# Patient Record
Sex: Female | Born: 1975
Health system: Southern US, Community
[De-identification: ages and names within clinical notes are randomized; demographics above are authoritative.]

## PROBLEM LIST (undated history)

## (undated) DIAGNOSIS — K219 Gastro-esophageal reflux disease without esophagitis: Secondary | ICD-10-CM

## (undated) DIAGNOSIS — F17200 Nicotine dependence, unspecified, uncomplicated: Secondary | ICD-10-CM

## (undated) DIAGNOSIS — J069 Acute upper respiratory infection, unspecified: Secondary | ICD-10-CM

## (undated) DIAGNOSIS — Z1379 Encounter for other screening for genetic and chromosomal anomalies: Secondary | ICD-10-CM

## (undated) DIAGNOSIS — R112 Nausea with vomiting, unspecified: Secondary | ICD-10-CM

## (undated) DIAGNOSIS — N390 Urinary tract infection, site not specified: Secondary | ICD-10-CM

## (undated) DIAGNOSIS — Z9889 Other specified postprocedural states: Secondary | ICD-10-CM

## (undated) DIAGNOSIS — L989 Disorder of the skin and subcutaneous tissue, unspecified: Secondary | ICD-10-CM

## (undated) DIAGNOSIS — C50912 Malignant neoplasm of unspecified site of left female breast: Secondary | ICD-10-CM

## (undated) DIAGNOSIS — T8859XA Other complications of anesthesia, initial encounter: Secondary | ICD-10-CM

## (undated) DIAGNOSIS — T4145XA Adverse effect of unspecified anesthetic, initial encounter: Secondary | ICD-10-CM

## (undated) HISTORY — DX: Encounter for other screening for genetic and chromosomal anomalies: Z13.79

---
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
## 2001-02-10 ENCOUNTER — Encounter: Admission: RE | Admit: 2001-02-10 | Discharge: 2001-02-10 | Payer: Self-pay | Admitting: Obstetrics & Gynecology

## 2001-02-18 ENCOUNTER — Inpatient Hospital Stay (HOSPITAL_COMMUNITY): Admission: RE | Admit: 2001-02-18 | Discharge: 2001-03-02 | Payer: Self-pay | Admitting: Obstetrics & Gynecology

## 2001-02-18 ENCOUNTER — Encounter (INDEPENDENT_AMBULATORY_CARE_PROVIDER_SITE_OTHER): Payer: Self-pay | Admitting: Specialist

## 2001-02-26 ENCOUNTER — Encounter: Payer: Self-pay | Admitting: *Deleted

## 2001-03-03 ENCOUNTER — Encounter: Admission: RE | Admit: 2001-03-03 | Discharge: 2001-04-02 | Payer: Self-pay | Admitting: Obstetrics & Gynecology

## 2001-05-03 ENCOUNTER — Encounter: Admission: RE | Admit: 2001-05-03 | Discharge: 2001-06-02 | Payer: Self-pay | Admitting: Obstetrics & Gynecology

## 2001-07-03 ENCOUNTER — Encounter: Admission: RE | Admit: 2001-07-03 | Discharge: 2001-08-02 | Payer: Self-pay | Admitting: Obstetrics & Gynecology

## 2001-08-03 ENCOUNTER — Encounter: Admission: RE | Admit: 2001-08-03 | Discharge: 2001-09-02 | Payer: Self-pay | Admitting: Obstetrics & Gynecology

## 2001-10-01 ENCOUNTER — Encounter: Admission: RE | Admit: 2001-10-01 | Discharge: 2001-10-31 | Payer: Self-pay | Admitting: Obstetrics & Gynecology

## 2001-12-01 ENCOUNTER — Encounter: Admission: RE | Admit: 2001-12-01 | Discharge: 2001-12-31 | Payer: Self-pay | Admitting: Obstetrics & Gynecology

## 2002-01-31 ENCOUNTER — Encounter: Admission: RE | Admit: 2002-01-31 | Discharge: 2002-03-02 | Payer: Self-pay | Admitting: Obstetrics & Gynecology

## 2002-03-03 ENCOUNTER — Encounter: Admission: RE | Admit: 2002-03-03 | Discharge: 2002-04-02 | Payer: Self-pay | Admitting: Obstetrics & Gynecology

## 2002-05-03 ENCOUNTER — Encounter: Admission: RE | Admit: 2002-05-03 | Discharge: 2002-06-02 | Payer: Self-pay | Admitting: Obstetrics & Gynecology

## 2008-09-28 ENCOUNTER — Emergency Department (HOSPITAL_COMMUNITY): Admission: EM | Admit: 2008-09-28 | Discharge: 2008-09-28 | Payer: Self-pay | Admitting: Emergency Medicine

## 2010-09-18 LAB — STREP A DNA PROBE: Group A Strep Probe: NEGATIVE

## 2010-09-18 LAB — RAPID STREP SCREEN (MED CTR MEBANE ONLY): Streptococcus, Group A Screen (Direct): NEGATIVE

## 2010-10-25 NOTE — Discharge Summary (Signed)
[place]  Patient:    Kathryn Stanley, Kathryn Stanley Visit Number: [id number] MRN: [id number]          Service Type: OBS Location: [id number] Attending Physician:  [name] Dictated by:   Herminio Commons. Donnal Debar, M.D. Admit Date:  02/18/2001 Discharge Date: 03/02/2001                             Discharge Summary  DATE OF BIRTH:                April 05, 1976  REASON FOR ADMISSION:         Shortened cervix on ultrasound.  HISTORY OF PRESENT ILLNESS:   This 35 year old G1, at the time P0, presented at 24-0/7 weeks by menstrual period, confirmed by a 16-week ultrasound.  She is for anatomic survey and was noted to have a shortened cervix at 1.9 cm on ultrasound.  The patient had reported some occasional cramping but had not reported any frank contractions.  The pregnancy was complicated by a twin gestation and the fact that the patient was rubella nonimmune, and she also had a history of pyelonephritis in the pregnancy and multiple urinary tract infections in the past.  MEDICATIONS:                  Prenatal vitamin and Macrobid.  ALLERGIES:                    AMOXICILLIN.  OBSTETRICAL HISTORY:          No prior pregnancies.  GYNECOLOGICAL HISTORY:        History of pyelonephritis.  She was on Macrobid for this.  PAST MEDICAL HISTORY:         Multiple UTIs as a child.  PAST SURGICAL HISTORY:        None.  FAMILY HISTORY:               Noncontributory.  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  No other illicit drug use.  No alcohol or tobacco use.  PRENATAL LABORATORY DATA:     All negative except for the fact that the patient was rubella nonimmune.  PHYSICAL EXAMINATION:  GENERAL:                      Well-developed, well-nourished white female in no acute distress.  HEART:                        Regular rate and rhythm.  LUNGS:                        Clear to auscultation.  ABDOMEN:                       Soft, gravid, positive bowel sounds.  EXTREMITIES:                  With 2+ pulses, no edema.  PELVIC:                       Digital cervical exam was 1, 50%, and posterior, 0 station.  She was not having contractions at the time.  HOSPITAL COURSE:  The patient was admitted for treatment of what initially was thought to be cervicitis.  She was placed on clindamycin.  She was then found to have an enterococcus urinary tract infection.  This was switched to vancomycin IV.  The patient did have one episode of flushing and "red man syndrome" which is not an allergic reaction.  She recovered nicely from this.  Culture and sensitivity revealed that the Enterococcus was sensitive to Macrobid, so the vancomycin was discontinued.  The patient was placed on p.o. Macrobid.  Further request of the patient was made of what her specific allergies were.  She said it occurred in middle school.  She was not sure but was told by her mom that she had a rash with amoxicillin. She had no breathing problems or urticaria.  The patients GBS was negative; therefore, she was not initially placed on Unasyn.  Later in the hospitalization the patient was placed on IV Unasyn per protocol.  On hospital day #8 the patients cramping sensation increased, with some contractions being picked up on the tocolysis monitor.  She subsequently reported increased cramping, but the tocolysis was not measuring well per nurse.  This was adjusted, and the patient was noted to be having a few contractions per report, approximately three contractions over the space of one hour.  The patient was on Procardia. She was placed on Unasyn.  She was placed in Trendelenburg.  She received Terbutaline subcutaneous x 1 to stop her contractions and was then reevaluated.  The patient was at that point having contractions in her back and vomiting x 1, even after receiving Phenergan.  She at this time was reporting painful contractions,  increase in her pain.  At this time her contractions still were not picking up that well on the tocolysis, with only two in the last hour being reported.  At this time the Procardia was discontinued, and the patient was started on magnesium sulfate as well as given Stadol 1 mg IV.  The patient was then reevaluated later early in the morning on hospital day #9.  She was feeling a pressure sensation with her contractions.  She was then at that point noted to be contracting every 7-10 minutes on tocolysis.  The attending physician at this time saw the patient and evaluated her, and she was noted to be complete at 10 cm and 100% effaced. Bedside ultrasound revealed twin A and twin B to be breech.  The patient then received stat C-section for delivery of breech twins at complete.  The rest of her hospital course was uneventful.  On postoperative day #2 she was discharged home with Motrin and Percocet for pain.  Her staples were removed prior to discharge, and iron sulfate for her anemia with H&H of 9.3 and 26.7.   FOLLOW-UP:                    She is to follow up at the [facility] in six weeks for postpartum check.  She is to have a repeat blood count at that time.  DISCHARGE DIAGNOSIS:          Twin gestation, delivered preterm.  CONDITION ON DISCHARGE:       Stable. Dictated by:   Herminio Commons. Donnal Debar, M.D. Attending Physician:  [name] DD:  03/02/01 TD:  03/02/01 Job: [id number]

## 2010-10-25 NOTE — Op Note (Signed)
[facility]  Patient:    Kathryn Stanley, Kathryn Stanley Visit Number: [id number] MRN: [id number]          Service Type: OBS Location: [place] Attending Physician:  Edmonia Caprio Dictated by:   Lovenia Kim, M.D. Proc. Date: 02/27/01                             Operative Report  PREOPERATIVE DIAGNOSIS:       A 25-2/7-week twin gestation, breech/breech presentation in active labor.  POSTOPERATIVE DIAGNOSIS:      A 25-2/7-week twin gestation, breech/breech presentation in active labor.  PROCEDURE:                    Emergent low segment transverse cesarean section with classical extension.  SURGEON:                      Lovenia Kim, M.D.  ASSISTANT:                    Agnes Lawrence, M.D.  ANESTHESIA:                   Spinal by Dr. Jillyn Hidden.  ESTIMATED BLOOD LOSS:         1000 cc.  COMPLICATIONS:                None.  DRAINS:                       Foley.  COUNTS:                       Correct.  DISPOSITION:                  Patient to recovery in good condition.  DESCRIPTION OF PROCEDURE:     After being quickly apprised of the risks of anesthesia, infection, bleeding, injury to abdominal organs with need for repair, the patient was brought to the operating room where she is administered a spinal anesthetic without complications, prepped and draped in the usual sterile fashion. A Foley catheter placed. After achieving adequate anesthesia, Pfannenstiel skin incision is made, carried down to the fascia which is nicked in midline and opened transversely using Mayo scissors. Rectus muscles dissected sharply in the midline. Peritoneum entered sharply. Bladder blade placed. Upon inspecting the uterus, there is a well-developed but small uterine segment with an area of contraction above the lower uterine segment. Bladder flap was then developed sharply off the lower uterine segment and the lower uterine segment is incised, clear  fluid upon delivery of the first fetus, breech presentation. Head entrapment is noted with inability to deliver the vertex immediately; therefore, the incision is extended in a J-type fashion up the left lateral wall of the uterine body extending into the active segment. Atraumatic delivery with flexion is then performed of fetus A which is handed to the pediatricians who are in attendance. Fetus B is then converted internally from a right transverse lie to a vertex presentation. Amniotomy is performed for clear fluid and atraumatic delivery of a now vertex presenting fetus B is performed and handed to the pediatricians in attendance. The placenta is removed manually and intact and sent to pathology for inspection. At this time, normal tubes and ovaries are noted. Uterus is curetted using a dry lap pack and closed using a O  Monocryl, the active segment in two layers, the lower uterine segment is then closed separately with one layer of O Monocryl suture. Good hemostasis is achieved. Bladder flap is inspected and found to be hemostatic. Paracolic gutters irrigated. All blood clots subsequently removed. Fascia closed using a 1-0 Vicryl in a continuous running fashion. Skin closed using staples. The patient tolerates the procedure well and is transferred to recovery in good condition. Dictated by:   Lovenia Kim, M.D. Attending Physician:  Edmonia Caprio DD:  02/27/01 TD:  02/27/01 Job: [id number]

## 2016-12-07 HISTORY — PX: BREAST BIOPSY: SHX20

## 2016-12-12 ENCOUNTER — Encounter: Payer: Self-pay | Admitting: Internal Medicine

## 2016-12-12 ENCOUNTER — Ambulatory Visit (INDEPENDENT_AMBULATORY_CARE_PROVIDER_SITE_OTHER): Payer: Medicaid Other | Admitting: Internal Medicine

## 2016-12-12 VITALS — BP 127/81 | HR 81 | Temp 98.0°F | Wt 181.8 lb

## 2016-12-12 DIAGNOSIS — F17210 Nicotine dependence, cigarettes, uncomplicated: Secondary | ICD-10-CM | POA: Diagnosis not present

## 2016-12-12 DIAGNOSIS — N6452 Nipple discharge: Secondary | ICD-10-CM

## 2016-12-12 DIAGNOSIS — N6321 Unspecified lump in the left breast, upper outer quadrant: Secondary | ICD-10-CM | POA: Diagnosis not present

## 2016-12-12 DIAGNOSIS — Z720 Tobacco use: Secondary | ICD-10-CM

## 2016-12-12 DIAGNOSIS — Z79899 Other long term (current) drug therapy: Secondary | ICD-10-CM | POA: Diagnosis not present

## 2016-12-12 MED ORDER — NICOTINE 21 MG/24HR TD PT24: 21.0000 mg | MEDICATED_PATCH | TRANSDERMAL | 0 refills | Status: AC

## 2016-12-12 NOTE — Assessment & Plan Note (Signed)
Tobacco use + smoking cessation  Assessment: Pt expresses interest and enthusiasm for smoking cessation, and also has encouragement from husband who successfully quit smoking. Dr. Maudie Mercury of pharmacy provided various materials and samples that are free or low cost due to uninsured status.   Plan: -Nicotine patch 21 mg x 14 patches  -Nicotine lozenges 2 mg x 10  -Quitline [place] referral and free class resources  -F/u in 3 weeks to assess response and for further health maintenance

## 2016-12-12 NOTE — Patient Instructions (Addendum)
It was very nice to meet you and thank you for coming in Mrs. Kathryn Stanley.   Your breast pain and bump is likely due to fibrocystic breast changes which is benign. To be safe, we have put in an order for a mammogram. Ms. Kathryn Stanley gave you some information and it should end up being free or low cost.   To help you quit smoking, you have some nicotine patches and lozenges. Apply 1 patch in the morning to help reduce cravings and withdrawal symptoms. You can change out the patch the following morning. If you still have strong cravings, try the nicotine lozenge before cigarettes.   There is also some information on free classes that you can call to register for which may be able to provide other strategies or other free materials to help you quit smoking.

## 2016-12-12 NOTE — Assessment & Plan Note (Signed)
Breast mass Assessment: Given her age, pain relation to menstruation and other history elements, negative family history, and exam characteristics her breast mass is likely due to fibrocystic changes of the breast rather than a malignant process. She was referred for diagnostic mammogram to rule out malignancy. Her bilateral and non-bloody nipple discharge is likely physiologic given chronic nature and is also reassuring that her mass is more likely benign.   Plan: -F/u diagnostic mammogram +/- breast US

## 2016-12-12 NOTE — Progress Notes (Signed)
   CC: Breast lump  HPI:  [name] is a 41 y.o. F with no significant past medical history who presents to the clinic for a palpable breast mass and nipple discharge.   She reports a 3 month history of a painful lump in her L breast. The pain is intermittent, described as stabbing and has worsened in the last 1.5 months. She states the pain increases with menstrual cycles, especially her LMP 1 week ago. She's also had bilateral milky nipple discharge, though this is a chronic problem since she gave birth 16 years ago with no recent change in frequency or character. No family or personal history of breast ca.   She also expresses a desire for smoking cessation. She currently smokes 1 ppd, gets headaches and cravings for cigarettes with prolonged periods without smoking.    Past Medical History: No significant medical history reported  Family History: Father: stroke, Maternal GM: DM. No family history of breast or colon cancer.  Social History: Current ppd smoker, married and employed, sexually active with husband, occasional alcohol use, no other drug use    Review of Systems:  Review of Systems  Constitutional: Negative for chills, fever, malaise/fatigue and weight loss.  HENT: Negative for congestion, ear discharge and ear pain.   Eyes: Negative for double vision and pain.  Respiratory: Negative for cough and shortness of breath.   Cardiovascular: Negative for chest pain.  Gastrointestinal: Negative for abdominal pain, nausea and vomiting.  Musculoskeletal: Negative for joint pain and myalgias.  Neurological: Positive for headaches (with cigarette cravings). Negative for dizziness and focal weakness.  Endo/Heme/Allergies: Does not bruise/bleed easily.  Psychiatric/Behavioral: Negative for hallucinations and memory loss.      Physical Exam:  Vitals:   12/12/16 1509  BP: 127/81  Pulse: 81  Temp: 98 F (36.7 C)  TempSrc: Oral  SpO2: 100%  Weight: 181 lb 12.8 oz (82.5  kg)   Physical Exam  Constitutional: She is oriented to person, place, and time. She appears well-developed and well-nourished.  Eyes: Pupils are equal, round, and reactive to light.  Cardiovascular: Normal rate, regular rhythm and intact distal pulses.   Pulmonary/Chest: Effort normal and breath sounds normal. She has no wheezes.  Abdominal: Soft. She exhibits no distension. There is no tenderness. There is no guarding.  Musculoskeletal: Normal range of motion.  Neurological: She is alert and oriented to person, place, and time. No sensory deficit.  Skin: Skin is warm and dry. Capillary refill takes less than 2 seconds.  Psychiatric: She has a normal mood and affect.  Breast: 1 cm tender, well circumscribed and freely mobile mass in upper outer quadrant of L breast about 4 cm from nipple at 2 o'clock. Bilateral milky discharge expressed, no blood. No axillary adenopathy, no abnormalities of R breast   Assessment & Plan:   See Encounters Tab for problem based charting.  Patient seen with Dr. Dareen Piano

## 2016-12-15 ENCOUNTER — Other Ambulatory Visit (HOSPITAL_COMMUNITY): Payer: Self-pay | Admitting: *Deleted

## 2016-12-15 DIAGNOSIS — N6320 Unspecified lump in the left breast, unspecified quadrant: Secondary | ICD-10-CM

## 2016-12-15 NOTE — Progress Notes (Signed)
Internal Medicine Clinic Attending  I saw and evaluated the patient.  I personally confirmed the key portions of the history and exam documented by Dr. Harden and I reviewed pertinent patient test results.  The assessment, diagnosis, and plan were formulated together and I agree with the documentation in the resident's note.  

## 2016-12-25 ENCOUNTER — Ambulatory Visit
Admission: RE | Admit: 2016-12-25 | Discharge: 2016-12-25 | Disposition: A | Discharge status: Home / Self Care | Payer: No Typology Code available for payment source | Source: Ambulatory Visit | Attending: Obstetrics and Gynecology | Admitting: Obstetrics and Gynecology

## 2016-12-25 ENCOUNTER — Encounter (HOSPITAL_COMMUNITY): Payer: Self-pay

## 2016-12-25 ENCOUNTER — Ambulatory Visit (HOSPITAL_COMMUNITY)
Admission: RE | Admit: 2016-12-25 | Discharge: 2016-12-25 | Disposition: A | Discharge status: Home / Self Care | Payer: Self-pay | Source: Ambulatory Visit | Attending: Obstetrics and Gynecology | Admitting: Obstetrics and Gynecology

## 2016-12-25 ENCOUNTER — Other Ambulatory Visit (HOSPITAL_COMMUNITY): Payer: Self-pay | Admitting: Obstetrics and Gynecology

## 2016-12-25 VITALS — BP 124/78 | Temp 98.4°F | Ht 64.0 in | Wt 185.0 lb

## 2016-12-25 DIAGNOSIS — N6320 Unspecified lump in the left breast, unspecified quadrant: Secondary | ICD-10-CM

## 2016-12-25 DIAGNOSIS — N6452 Nipple discharge: Secondary | ICD-10-CM

## 2016-12-25 DIAGNOSIS — R921 Mammographic calcification found on diagnostic imaging of breast: Secondary | ICD-10-CM

## 2016-12-25 DIAGNOSIS — Z01419 Encounter for gynecological examination (general) (routine) without abnormal findings: Secondary | ICD-10-CM

## 2016-12-25 DIAGNOSIS — N6321 Unspecified lump in the left breast, upper outer quadrant: Secondary | ICD-10-CM

## 2016-12-25 DIAGNOSIS — R599 Enlarged lymph nodes, unspecified: Secondary | ICD-10-CM

## 2016-12-25 NOTE — Patient Instructions (Addendum)
Explained breast self awareness with Kathryn Stanley. Let patient know that the results of today's Pap smear will determine follow up due to her last Pap smear being abnormal and no follow up completed per patient. Told patient about the lesions observed on her cervix and that will refer to the [facility] [place] [facility] for follow up. Explained if Pap smear is abnormal that [facility] will cover the follow up. Referred patient to the [place] for a diagnostic mammogram and possible left breast ultrasound. Appointment scheduled for Thursday, December 25, 2016 at 1050. Let patient know will follow up with her within the next couple weeks with results of Pap smear and breast discharge by phone. Kathryn Stanley verbalized understanding.  Kathryn Stanley, Arvil Chaco, RN 9:43 AM

## 2016-12-25 NOTE — Addendum Note (Signed)
Encounter addended by: Loletta Parish, RN on: 12/25/2016 12:50 PM<BR>    Actions taken: Order list changed

## 2016-12-25 NOTE — Progress Notes (Signed)
Complaints of left breast lump x 3 months and left breast discharge that is clear/white x 16 years when expresses.  Pap Smear: Pap smear completed today. Last Pap smear was 16 years ago and abnormal per patient. Patient stated she did not follow up as recommended. Per patient her last Pap smear is the only abnormal Pap smear she has had. No Pap smear results are in EPIC.  Physical exam: Breasts Breasts symmetrical. No skin abnormalities bilateral breasts. No nipple retraction bilateral breasts. No nipple discharge right breast. Expressed a whitish/yellowish colored nipple discharge from the left breast on exam. Sample of discharge sent to Cytology for evaluation. No lymphadenopathy. No lumps palpated right breast. Palpated a lump within the left breast at 1 o'clock 8 cm from the nipple. No complaints of pain or tenderness on exam. Referred patient to the [place] for a diagnostic mammogram and possible left breast ultrasound. Appointment scheduled for Thursday, December 25, 2016 at 1050.  Pelvic/Bimanual   Ext Genitalia No lesions, no swelling and no discharge observed on external genitalia.         Vagina Vagina pink and normal texture. No lesions or discharge observed in vagina.          Cervix Cervix is present. Cervix pink and of normal texture. Cervix friable. No discharge observed. Observed two whitish colored lesions between 6-9 o'clock on the cervical os. Will refer to the [facility] [place] [facility] for follow up.    Uterus Uterus is present and palpable. Uterus in normal position and normal size.        Adnexae Bilateral ovaries present and palpable. No tenderness on palpation.          Rectovaginal No rectal exam completed today since patient had no rectal complaints. No skin abnormalities observed on exam.    Smoking History: Patient has never smoked.  Patient Navigation: Patient education provided. Access to services provided for  patient through [facility] program.

## 2016-12-26 ENCOUNTER — Other Ambulatory Visit (HOSPITAL_COMMUNITY): Payer: Self-pay | Admitting: Obstetrics and Gynecology

## 2016-12-26 ENCOUNTER — Encounter (HOSPITAL_COMMUNITY): Payer: Self-pay | Admitting: *Deleted

## 2016-12-26 DIAGNOSIS — R921 Mammographic calcification found on diagnostic imaging of breast: Secondary | ICD-10-CM

## 2016-12-26 LAB — CYTOLOGY - PAP
DIAGNOSIS: NEGATIVE
HPV (WINDOPATH): NOT DETECTED

## 2016-12-29 ENCOUNTER — Ambulatory Visit
Admission: RE | Admit: 2016-12-29 | Discharge: 2016-12-29 | Disposition: A | Discharge status: Home / Self Care | Payer: No Typology Code available for payment source | Source: Ambulatory Visit | Attending: Obstetrics and Gynecology | Admitting: Obstetrics and Gynecology

## 2016-12-29 DIAGNOSIS — R599 Enlarged lymph nodes, unspecified: Secondary | ICD-10-CM

## 2016-12-29 DIAGNOSIS — N6320 Unspecified lump in the left breast, unspecified quadrant: Secondary | ICD-10-CM

## 2016-12-29 DIAGNOSIS — R921 Mammographic calcification found on diagnostic imaging of breast: Secondary | ICD-10-CM

## 2016-12-30 ENCOUNTER — Telehealth: Payer: Self-pay | Admitting: *Deleted

## 2016-12-30 NOTE — Telephone Encounter (Signed)
Left message for a return phone call to schedule for [facility] 01/07/17

## 2016-12-30 NOTE — Telephone Encounter (Signed)
Received return phone call. Confirmed [facility] for 01/07/17 at 815am .  Instructions and contact information given.

## 2017-01-05 DIAGNOSIS — Z170 Estrogen receptor positive status [ER+]: Principal | ICD-10-CM | POA: Insufficient documentation

## 2017-01-05 DIAGNOSIS — C50412 Malignant neoplasm of upper-outer quadrant of left female breast: Secondary | ICD-10-CM | POA: Insufficient documentation

## 2017-01-05 NOTE — Progress Notes (Signed)
[place]  Telephone:([phone]) [phone] Fax:([phone]) [place] Note   Patient Care Team: [name], MD as PCP - General (Internal Medicine) [name], MD as Consulting Physician (General Surgery) [name], MD as Consulting Physician (Hematology) [name], MD as Consulting Physician (Radiation Oncology) 01/07/2017  CHIEF COMPLAINTS/PURPOSE OF CONSULTATION:  Consultation for Malignant neoplasm of upper-outer quadrant of left breast in female, estrogen receptor positive   Oncology History   Cancer Staging Malignant neoplasm of upper-outer quadrant of left breast in female, estrogen receptor positive ([place]) Staging form: Breast, AJCC 8th Edition - Clinical stage from 12/29/2016: Stage IIIA (cT2, cN1, cM0, G3, ER: Positive, PR: Negative, HER2: Negative) - Signed by [name], MD on 01/05/2017       Malignant neoplasm of upper-outer quadrant of left breast in female, estrogen receptor positive ([place])   12/25/2016 Mammogram    [place] and MM Diagnostic Breast Tomo Bilateral 12/25/16 IMPRESSION: 1. Suspicious mass 2.3 x 1.6 x 3.0 cm  in the 130 o'clock location of the left breast 8cm from the nipple, biopsy recommended. Adjacent simple cyst is 1.9 cm. 2. Suspicious left axillary lymph node 2.2cm for which biopsy is indicated. 3. Indeterminate group of calcifications in the upper central portion of the left breast for which biopsy is recommended.       12/29/2016 Initial Biopsy    Diagnosis 12/29/16 1. Breast, left, needle core biopsy, stereotactic, upper inner quadrant - FIBROCYSTIC CHANGES INCLUDING APOCRINE METAPLASIA WITH CALCIFICATIONS - NO MALIGNANCY IDENTIFIED 2. Breast, left, needle core biopsy, ultrasound, 1:30 o'clock - INVASIVE MAMMARY CARCINOMA, G3 - SEE COMMENT 3. Lymph node, needle/core biopsy, ultrasound, left axillary - METASTIC CARCINOMA INVOLVING ONE LYMPH NODE (1/1)       12/29/2016 Receptors her2    ER 15%+, weak staining  PR  - HER2- Ki67 10%       12/29/2016 Initial Diagnosis    Malignant neoplasm of upper-outer quadrant of left breast in female, estrogen receptor positive ([place])        HISTORY OF PRESENTING ILLNESS: 01/07/17 Kathryn Stanley 41 y.o. female is here because of newly diagnosed Malignant neoplasm of upper-outer quadrant of left breast in female, estrogen receptor positive. She presents to the breast clinic today with her common law husband. She felt the lump herself 3 months ago. She felt pain like "stabbing" first. She then had a mammogram 12/25/16 and subsequent biopsy   In the past she was using smoking match and quit smoking "cold [place]" that was 1 month ago.   Today she reports her breast pain still comes and goes. She has not noticed any new changes. 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 Lately has regular period. She has a weak cervix and her children were born premature. She is currently working at [employer] and works on her feet all the time, working 5 days a week.     GYN HISTORY  Menarchal: 10 LMP: 12/10/16 Contraceptive: No HRT: NA GP: G2P1 - twins     MEDICAL HISTORY:  Past Medical History:  Diagnosis Date  . Dermatologic problem    possible psoriasis. Pt has not had been diagnosed by dermatology.  . Tobacco dependence    trying to quit with nicotene patches    SURGICAL HISTORY: Past Surgical History:  Procedure Laterality Date  . CESAREAN SECTION      SOCIAL HISTORY: Social History   Social History  .  Marital status: Married    Spouse name: N/A  . Number of children: N/A  . Years of education: N/A   Occupational History  . Not on file.   Social History Main Topics  . Smoking status: Former Smoker    Packs/day: 1.00    Years: 17.00    Types: Cigarettes    Quit date: 12/22/2016  . Smokeless tobacco: Never Used  . Alcohol use No  . Drug use: No  . Sexual activity: Yes     Birth control/ protection: None   Other Topics Concern  . Not on file   Social History Narrative  . No narrative on file    FAMILY HISTORY: Family History  Problem Relation Age of Onset  . Stroke Maternal Grandmother     ALLERGIES:  is allergic to amoxicillin.  MEDICATIONS:  Current Outpatient Prescriptions  Medication Sig Dispense Refill  . nicotine polacrilex (COMMIT) 2 MG lozenge Take 2 mg by mouth as needed.     No current facility-administered medications for this visit.     REVIEW OF SYSTEMS:   Constitutional: Denies fevers, chills or abnormal night sweats Eyes: Denies blurriness of vision, double vision or watery eyes Ears, nose, mouth, throat, and face: Denies mucositis or sore throat Respiratory: Denies cough, dyspnea or wheezes Cardiovascular: Denies palpitation, chest discomfort or lower extremity swelling Gastrointestinal:  Denies nausea, heartburn or change in bowel habits Skin: Denies abnormal skin rashes Lymphatics: Denies new lymphadenopathy or easy bruising Neurological:Denies numbness, tingling or new weaknesses Breast: (+) occasional shooting pain in left breast, palpable mass in left breast Behavioral/Psych: Mood is stable, no new changes  All other systems were reviewed with the patient and are negative.  PHYSICAL EXAMINATION: ECOG PERFORMANCE STATUS: 0 - Asymptomatic  Vitals:   01/07/17 0830  BP: 118/72  Pulse: 84  Resp: 18  Temp: 98.1 F (36.7 C)   Filed Weights   01/07/17 0830  Weight: 183 lb 9.6 oz (83.3 kg)    GENERAL:alert, no distress and comfortable SKIN: skin color, texture, turgor are normal, no rashes or significant lesions EYES: normal, conjunctiva are pink and non-injected, sclera clear OROPHARYNX:no exudate, no erythema and lips, buccal mucosa, and tongue normal  NECK: supple, thyroid normal size, non-tender, without nodularity LYMPH:  no palpable lymphadenopathy in the cervical, axillary or inguinal LUNGS: clear to  auscultation and percussion with normal breathing effort HEART: regular rate & rhythm and no murmurs and no lower extremity edema ABDOMEN:abdomen soft, non-tender and normal bowel sounds Musculoskeletal:no cyanosis of digits and no clubbing  PSYCH: alert & oriented x 3 with fluent speech NEURO: no focal motor/sensory deficits Breast: (+) 3 x 3.5 cm mass in UIQ (12:00) of left breast, and  movable 1.5 cm lymph node in left axillary, non-tender  LABORATORY DATA:  I have reviewed the data as listed CBC Latest Ref Rng & Units 01/07/2017  WBC 3.9 - 10.3 10e3/uL 5.6  Hemoglobin 11.6 - 15.9 g/dL 13.6  Hematocrit 34.8 - 46.6 % 39.0  Platelets 145 - 400 10e3/uL 240    CMP Latest Ref Rng & Units 01/07/2017  Glucose 70 - 140 mg/dl 107  BUN 7.0 - 26.0 mg/dL 16.8  Creatinine 0.6 - 1.1 mg/dL 0.8  Sodium 136 - 145 mEq/L 138  Potassium 3.5 - 5.1 mEq/L 3.9  CO2 22 - 29 mEq/L 23  Calcium 8.4 - 10.4 mg/dL 9.1  Total Protein 6.4 - 8.3 g/dL 6.9  Total Bilirubin 0.20 - 1.20 mg/dL 0.50  Alkaline Phos 40 -  150 U/L 64  AST 5 - 34 U/L 19  ALT 0 - 55 U/L 26     PATHOLOGY  Diagnosis 12/29/16 1. Breast, left, needle core biopsy, stereotactic, upper inner quadrant - FIBROCYSTIC CHANGES INCLUDING APOCRINE METAPLASIA WITH CALCIFICATIONS - NO MALIGNANCY IDENTIFIED 2. Breast, left, needle core biopsy, ultrasound, 1:30 o'clock - INVASIVE MAMMARY CARCINOMA - SEE COMMENT 3. Lymph node, needle/core biopsy, ultrasound, left axillary - METASTIC CARCINOMA INVOLVING ONE LYMPH NODE (1/1) Microscopic Comment 2. The biopsy material shows an infiltrative proliferation of cells with large vesicular nuclei with conspicuous nucleoli, arranged linearly and in small clusters. There are scattered cells with marked pleomorphism and cytologic atypia. Based on the biopsy, the carcinoma appears Nottingham grade 3 of 3 and measures 0.9 cm in greatest linear extent. E-cadherin and prognostic markers (ER/PR/ki-67/HER2-FISH)are  pending and will be reported in an addendum. Dr. Tresa Moore reviewed the case and agrees with the above diagnosis. This case was called to [facility] on December 30, 2016. Results: IMMUNOHISTOCHEMICAL AND MORPHOMETRIC ANALYSIS PERFORMED MANUALLY Estrogen Receptor: 15%, POSITIVE, WEAK STAINING INTENSITY Progesterone Receptor: 0%, NEGATIVE Proliferation Marker Ki67: 10% COMMENT: The negative hormone receptor study(ies) in this case has An internal positive control. Results: HER2 - NEGATIVE RATIO OF HER2/CEP17 SIGNALS 1.63 AVERAGE HER2 COPY NUMBER PER CELL 3.10   RADIOGRAPHIC STUDIES: I have personally reviewed the radiological images as listed and agreed with the findings in the report. US Breast Ltd Uni Left Inc Axilla  Result Date: 12/25/2016 CLINICAL DATA:  Palpable abnormality in the left breast, first noted 3 months ago. Mass associated with mild tenderness. EXAM: 2D DIGITAL DIAGNOSTIC BILATERAL MAMMOGRAM WITH CAD AND ADJUNCT TOMO ULTRASOUND LEFT BREAST COMPARISON:  Baseline evaluation ACR Breast Density Category c: The breast tissue is heterogeneously dense, which may obscure small masses. FINDINGS: Within the upper-outer quadrant of the left breast, there is a spiculated mass marked as palpable with a BB. Within the upper central portion of the left breast there is a 5 mm group of fine pleomorphic calcifications not associated with mass or distortion. Right breast is negative. Mammographic images were processed with CAD. On physical exam, I palpate a discrete rounded non mobile mass in the upper-outer quadrant of the left breast. Targeted ultrasound is performed, showing an irregular hypoechoic mass in the 130 o'clock location of the left breast 8 cm from the nipple. Mass is associated with posterior acoustic enhancement and shadowing and measures 2.3 x 1.6 x 3.0 cm. There is internal vascularity on Doppler evaluation. Adjacent simple cyst is 1.9 cm. Evaluation of the left axilla  shows a single enlarged lymph node lacking fatty hilum and measuring 2.2 cm and associated trans capsular blood flow on Doppler evaluation. IMPRESSION: 1. Suspicious mass in the 130 o'clock location of the left breast for which biopsy is indicated. 2. Suspicious left axillary lymph node for which biopsy is indicated. 3. Indeterminate group of calcifications in the upper central portion of the left breast for which biopsy is recommended. RECOMMENDATION: 1. Ultrasound-guided core biopsy of mass in the 130 o'clock location of the left breast. 2. Ultrasound-guided core biopsy of left axillary lymph node. 3. Stereotactic guided core biopsy of left breast calcifications. I have discussed the findings and recommendations with the patient and her husband. Results were also provided in writing at the conclusion of the visit. If applicable, a reminder letter will be sent to the patient regarding the next appointment. BI-RADS CATEGORY  5: Highly suggestive of malignancy. Electronically Signed   By:  Nolon Nations M.D.   On: 12/25/2016 10:53   Mm Diag Breast Tomo Bilateral  Result Date: 12/25/2016 CLINICAL DATA:  Palpable abnormality in the left breast, first noted 3 months ago. Mass associated with mild tenderness. EXAM: 2D DIGITAL DIAGNOSTIC BILATERAL MAMMOGRAM WITH CAD AND ADJUNCT TOMO ULTRASOUND LEFT BREAST COMPARISON:  Baseline evaluation ACR Breast Density Category c: The breast tissue is heterogeneously dense, which may obscure small masses. FINDINGS: Within the upper-outer quadrant of the left breast, there is a spiculated mass marked as palpable with a BB. Within the upper central portion of the left breast there is a 5 mm group of fine pleomorphic calcifications not associated with mass or distortion. Right breast is negative. Mammographic images were processed with CAD. On physical exam, I palpate a discrete rounded non mobile mass in the upper-outer quadrant of the left breast. Targeted ultrasound is  performed, showing an irregular hypoechoic mass in the 130 o'clock location of the left breast 8 cm from the nipple. Mass is associated with posterior acoustic enhancement and shadowing and measures 2.3 x 1.6 x 3.0 cm. There is internal vascularity on Doppler evaluation. Adjacent simple cyst is 1.9 cm. Evaluation of the left axilla shows a single enlarged lymph node lacking fatty hilum and measuring 2.2 cm and associated trans capsular blood flow on Doppler evaluation. IMPRESSION: 1. Suspicious mass in the 130 o'clock location of the left breast for which biopsy is indicated. 2. Suspicious left axillary lymph node for which biopsy is indicated. 3. Indeterminate group of calcifications in the upper central portion of the left breast for which biopsy is recommended. RECOMMENDATION: 1. Ultrasound-guided core biopsy of mass in the 130 o'clock location of the left breast. 2. Ultrasound-guided core biopsy of left axillary lymph node. 3. Stereotactic guided core biopsy of left breast calcifications. I have discussed the findings and recommendations with the patient and her husband. Results were also provided in writing at the conclusion of the visit. If applicable, a reminder letter will be sent to the patient regarding the next appointment. BI-RADS CATEGORY  5: Highly suggestive of malignancy. Electronically Signed   By: Nolon Nations M.D.   On: 12/25/2016 10:53   [place] Axillary Node Core Biopsy Left  Addendum Date: 12/30/2016   ADDENDUM REPORT: 12/30/2016 14:54 ADDENDUM: Pathology revealed FIBROCYSTIC CHANGES INCLUDING APOCRINE METAPLASIA WITH CALCIFICATIONS of the Left breast, upper inner quadrant. This was found to be concordant byDr. Ammie Ferrier. 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 This was found to be concordant by Dr. Ammie Ferrier. Pathology results were discussed with the patient by telephone.  The patient reported doing well after the biopsies with tenderness and minimal bleeding at the sites. Post biopsy instructions and care were reviewed and questions were answered. The patient was encouraged to call [facility] for any additional concerns. The patient was referred to [facility] on January 07, 2017. Pathology results reported by Terie Purser, RN on 12/30/2016. Electronically Signed   By: Ammie Ferrier M.D.   On: 12/30/2016 14:54   Result Date: 12/30/2016 CLINICAL DATA:  41 year old female presenting for ultrasound-guided biopsy of a left breast mass. EXAM: ULTRASOUND GUIDED LEFT BREAST CORE NEEDLE BIOPSY COMPARISON:  Previous exam(s). PROCEDURE: I met with the patient and we discussed the procedure of ultrasound-guided biopsy, including benefits and alternatives. We discussed the high likelihood of a  successful procedure. We discussed the risks of the procedure including infection, bleeding, tissue injury, clip migration, and inadequate sampling. Informed written consent was given. The usual time-out protocol was performed immediately prior to the procedure. Lesion quadrant: Upper-outer quadrant Using sterile technique and 1% Lidocaine as local anesthetic, under direct ultrasound visualization, a 12 gauge spring-loaded device was used to perform biopsy of mass in the left breast at 1:30using a superior approach. At the conclusion of the procedure, a ribbon shaped tissue marker clip was deployed into the biopsy cavity. Lesion quadrant: Upper-outer quadrant Using sterile technique and 1% Lidocaine as local anesthetic, under direct ultrasound visualization, a 14 gauge spring-loaded device was used to perform biopsy of a left axillary lymph nodeusing an inferior approach. At the conclusion of the procedure, a spiral shaped tissue marker clip was deployed into the biopsy cavity. Follow-up 2-view  mammogram was performed and dictated separately. IMPRESSION: 1. Ultrasound-guided biopsy of mass in the upper-outer quadrant of the left breast at 1:30. No apparent complications. 2. Ultrasound-guided biopsy of a a left axillary lymph node. No apparent complications. Electronically Signed: By: Ammie Ferrier M.D. On: 12/29/2016 14:34   Mm Clip Placement Left  Result Date: 12/29/2016 CLINICAL DATA:  Post biopsy mammogram of the left breast for clip placement. EXAM: DIAGNOSTIC LEFT MAMMOGRAM POST ULTRASOUND BIOPSY COMPARISON:  Previous exam(s). FINDINGS: Mammographic images were obtained following stereotactic biopsy of left breast calcifications, an ultrasound guided biopsy of left breast mass at 1:30 and a left axillary lymph node. The coil shaped biopsy marking clip is appropriately positioned at the site of biopsied calcifications in the upper left breast. The ribbon shaped biopsy marking clip is positioned within the biopsied mass in the upper-outer left breast. The spiral shaped biopsy marking clip in the lymph node of the left axilla cannot be visualized due to its deep positioning. IMPRESSION: 1. The coil shaped biopsy marking clip is position at the site of biopsied calcifications in the superior left breast. 2. The ribbon shaped biopsy marking clip is appropriately positioned within the mass in the upper-outer left breast. 3. Non visualization of the spiral shaped biopsy marking clip which was placed in the left axillary lymph node. This was well visualized sonographically. Final Assessment: Post Procedure Mammograms for Marker Placement Electronically Signed   By: Ammie Ferrier M.D.   On: 12/29/2016 14:35   Mm Lt Breast Bx W Loc Dev 1st Lesion Image Bx Spec Stereo Guide  Addendum Date: 12/30/2016   ADDENDUM REPORT: 12/30/2016 14:55 ADDENDUM: Pathology revealed FIBROCYSTIC CHANGES INCLUDING APOCRINE METAPLASIA WITH CALCIFICATIONS of the Left breast, upper inner quadrant. This was found to be  concordant byDr. Ammie Ferrier. 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 This was found to be concordant by Dr. Ammie Ferrier. Pathology results were discussed with the patient by telephone. The patient reported doing well after the biopsies with tenderness and minimal bleeding at the sites. Post biopsy instructions and care were reviewed and questions were answered. The patient was encouraged to call [facility] for any additional concerns. The patient was referred to [facility] at [facility] on January 07, 2017. Pathology results reported by Terie Purser, RN on 12/30/2016. Electronically Signed   By: Ammie Ferrier M.D.   On: 12/30/2016 14:55   Result Date: 12/30/2016 CLINICAL DATA:  41 year old female presenting for stereotactic biopsy of left breast calcifications.  EXAM: LEFT BREAST STEREOTACTIC CORE NEEDLE BIOPSY COMPARISON:  Previous exams. FINDINGS: The patient and I discussed the procedure of stereotactic-guided biopsy including benefits and alternatives. We discussed the high likelihood of a successful procedure. We discussed the risks of the procedure including infection, bleeding, tissue injury, clip migration, and inadequate sampling. Informed written consent was given. The usual time out protocol was performed immediately prior to the procedure. Using sterile technique and 1% Lidocaine as local anesthetic, under stereotactic guidance, a 9 gauge vacuum assisted device was used to perform core needle biopsy of calcifications in the upper inner quadrant of the left breast using a superior approach. Specimen radiograph was performed showing calcifications within multiple core samples. Specimens with calcifications are identified for pathology. Lesion quadrant: Upper inner quadrant At the conclusion of the  procedure, a coil shaped tissue marker clip was deployed into the biopsy cavity. Follow-up 2-view mammogram was performed and dictated separately. IMPRESSION: Stereotactic-guided biopsy of calcifications in the upper inner left breast. No apparent complications. Electronically Signed: By: Ammie Ferrier M.D. On: 12/29/2016 13:23   [place] Lt Breast Bx W Loc Dev 1st Lesion Img Bx Spec US Guide  Addendum Date: 12/30/2016   ADDENDUM REPORT: 12/30/2016 14:54 ADDENDUM: Pathology revealed FIBROCYSTIC CHANGES INCLUDING APOCRINE METAPLASIA WITH CALCIFICATIONS of the Left breast, upper inner quadrant. This was found to be concordant byDr. Ammie Ferrier. 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 This was found to be concordant by Dr. Ammie Ferrier. Pathology results were discussed with the patient by telephone. The patient reported doing well after the biopsies with tenderness and minimal bleeding at the sites. Post biopsy instructions and care were reviewed and questions were answered. The patient was encouraged to call [facility] for any additional concerns. The patient was referred to [facility] on January 07, 2017. Pathology results reported by Terie Purser, RN on 12/30/2016. Electronically Signed   By: Ammie Ferrier M.D.   On: 12/30/2016 14:54   Result Date: 12/30/2016 CLINICAL DATA:  41 year old female presenting for ultrasound-guided biopsy of a left breast mass. EXAM: ULTRASOUND GUIDED LEFT BREAST CORE NEEDLE BIOPSY COMPARISON:  Previous exam(s). PROCEDURE: I met with the patient and we discussed the procedure of ultrasound-guided biopsy, including benefits and alternatives. We discussed the high likelihood of a successful procedure. We discussed the risks of the procedure including infection, bleeding,  tissue injury, clip migration, and inadequate sampling. Informed written consent was given. The usual time-out protocol was performed immediately prior to the procedure. Lesion quadrant: Upper-outer quadrant Using sterile technique and 1% Lidocaine as local anesthetic, under direct ultrasound visualization, a 12 gauge spring-loaded device was used to perform biopsy of mass in the left breast at 1:30using a superior approach. At the conclusion of the procedure, a ribbon shaped tissue marker clip was deployed into the biopsy cavity. Lesion quadrant: Upper-outer quadrant Using sterile technique and 1% Lidocaine as local anesthetic, under direct ultrasound visualization, a 14 gauge spring-loaded device was used to perform biopsy of a left axillary lymph nodeusing an inferior approach. At the conclusion of the procedure, a spiral shaped tissue marker clip was deployed into the biopsy cavity. Follow-up 2-view mammogram was performed and dictated separately. IMPRESSION: 1. Ultrasound-guided biopsy of mass in the upper-outer quadrant of the left breast at 1:30. No apparent complications. 2. Ultrasound-guided biopsy of a a left axillary lymph node.  No apparent complications. Electronically Signed: By: Ammie Ferrier M.D. On: 12/29/2016 14:34    ASSESSMENT & PLAN:  Kathryn Stanley is a 41 y.o. premenopausal female with no significant past medical history, presented with a palpable left breast mass.  1. Malignant neoplasm of upper-outer quadrant of left breast in female, cT2N1M0, Stage IIIa, ER weakly +, PR - , HER2 - , Grade 3 -We reviewed her mammogram and initial biopsy results in details with patient and her husband.  -Her tumor is weakly ER positive, PR negative, HER-2 negative, grade 3, she has positive lymph nodes, those are all high risks for recurrence. I recommend neoadjuvant or adjuvant chemotherapy to reduce her risk of recurrence after surgery. -We discussed that the benefit of neoadjuvant  chemotherapy, to shrink the tumor and lymph nodes, and potentially she would be a candidate for lumpectomy and may not need lymph node dissection if she has good response. I have discussed this with her surgeon Dr. Donne Hazel today, both of [place] recommend her to consider neoadjuvant chemotherapy. She agrees.  -We discussed [facility] chemotherapy regimens. Given her young age, node positive disease, I recommend dose dense Adriamycin and Cytoxan (AC) every 2 weeks for 4 cycles, then proceed with weekly taxol for 12 treatments or biweekly Taxol for 4 treatments.  --Chemotherapy consent: Side effects including but does not not limited to, fatigue, nausea, vomiting, diarrhea, hair loss, neuropathy, fluid retention, renal and kidney dysfunction, neutropenic fever, needed for blood transfusion, bleeding, heart failure, small risk of leukemia and MDS in the future, were discussed with patient in great detail. She agrees to proceed. -The goal of therapy is curative. -We discussed chemotherapy induced infertility, post patient and her husband do not plan to have more children. No need for eggs preservation. -Giving the positive for ER, I will still recommend adjuvant antiestrogen therapy tamoxifen, after she completes adjuvant radiation. -She was seen by surgeon Dr. Donne Hazel today, surgical options were discussed. Dr. Donne Hazel will place a port for her next week. -She was seen by radiation oncologist Dr. 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 -We will proceed with Breast MRI, staging CT and bone scan, chemo class, port placement and ECHO  -We can possibly start chemo as soon as 01/16/17  2. Genetics -Due to her young age of diagnosis I suggest she get genetic testing. She agrees -refer to Genetics  3. Financial Support -They are not insured and are attempting to get an orange card.  -They will see our financial support/social worker    PLAN: -Genetics referral  -Order EMLA  cream, zofran and compazine today -Chemo class, echo, CT and bone scan within one week -Lab, flush, f/u and chemo AC on 8/10, and 8/24   All questions were answered. The patient knows to call the clinic with any problems, questions or concerns. I spent 55 minutes counseling the patient face to face. The total time spent in the appointment was 60 minutes and more than 50% was on counseling.  This document serves as a record of services personally performed by [name], MD. It was created on her behalf by Joslyn Devon, a trained medical scribe. The creation of this record is based on the scribe's personal observations and the provider's statements to them. This document has been checked and approved by the attending provider.     [name], MD 01/07/2017 6:10 PM

## 2017-01-06 ENCOUNTER — Other Ambulatory Visit: Payer: Self-pay | Admitting: *Deleted

## 2017-01-06 DIAGNOSIS — C50412 Malignant neoplasm of upper-outer quadrant of left female breast: Secondary | ICD-10-CM

## 2017-01-06 DIAGNOSIS — Z170 Estrogen receptor positive status [ER+]: Principal | ICD-10-CM

## 2017-01-07 ENCOUNTER — Encounter: Payer: Self-pay | Admitting: Physical Therapy

## 2017-01-07 ENCOUNTER — Encounter: Payer: Self-pay | Admitting: Hematology

## 2017-01-07 ENCOUNTER — Encounter: Payer: Self-pay | Admitting: Radiation Oncology

## 2017-01-07 ENCOUNTER — Ambulatory Visit
Admission: RE | Admit: 2017-01-07 | Discharge: 2017-01-07 | Disposition: A | Discharge status: Home / Self Care | Payer: Medicaid Other | Source: Ambulatory Visit | Attending: Radiation Oncology | Admitting: Radiation Oncology

## 2017-01-07 ENCOUNTER — Ambulatory Visit (HOSPITAL_BASED_OUTPATIENT_CLINIC_OR_DEPARTMENT_OTHER): Payer: Medicaid Other | Admitting: Hematology

## 2017-01-07 ENCOUNTER — Other Ambulatory Visit (HOSPITAL_BASED_OUTPATIENT_CLINIC_OR_DEPARTMENT_OTHER): Payer: Medicaid Other

## 2017-01-07 ENCOUNTER — Encounter: Payer: Self-pay | Admitting: *Deleted

## 2017-01-07 ENCOUNTER — Other Ambulatory Visit: Payer: Self-pay | Admitting: General Surgery

## 2017-01-07 ENCOUNTER — Ambulatory Visit: Payer: Medicaid Other | Attending: General Surgery | Admitting: Physical Therapy

## 2017-01-07 VITALS — BP 118/72 | HR 84 | Temp 98.1°F | Resp 18 | Ht 64.0 in | Wt 183.6 lb

## 2017-01-07 DIAGNOSIS — Z170 Estrogen receptor positive status [ER+]: Principal | ICD-10-CM

## 2017-01-07 DIAGNOSIS — C50412 Malignant neoplasm of upper-outer quadrant of left female breast: Secondary | ICD-10-CM | POA: Insufficient documentation

## 2017-01-07 DIAGNOSIS — Z87891 Personal history of nicotine dependence: Secondary | ICD-10-CM | POA: Diagnosis not present

## 2017-01-07 DIAGNOSIS — N644 Mastodynia: Secondary | ICD-10-CM

## 2017-01-07 DIAGNOSIS — R293 Abnormal posture: Secondary | ICD-10-CM | POA: Diagnosis present

## 2017-01-07 HISTORY — DX: Nicotine dependence, unspecified, uncomplicated: F17.200

## 2017-01-07 HISTORY — DX: Disorder of the skin and subcutaneous tissue, unspecified: L98.9

## 2017-01-07 LAB — CBC WITH DIFFERENTIAL/PLATELET
BASO%: 0.5 % (ref 0.0–2.0)
Basophils Absolute: 0 10*3/uL (ref 0.0–0.1)
EOS ABS: 0.1 10*3/uL (ref 0.0–0.5)
EOS%: 2.5 % (ref 0.0–7.0)
HEMATOCRIT: 39 % (ref 34.8–46.6)
HEMOGLOBIN: 13.6 g/dL (ref 11.6–15.9)
LYMPH#: 1.5 10*3/uL (ref 0.9–3.3)
LYMPH%: 26.7 % (ref 14.0–49.7)
MCH: 29.6 pg (ref 25.1–34.0)
MCHC: 34.9 g/dL (ref 31.5–36.0)
MCV: 84.8 fL (ref 79.5–101.0)
MONO#: 0.4 10*3/uL (ref 0.1–0.9)
MONO%: 7.1 % (ref 0.0–14.0)
NEUT%: 63.2 % (ref 38.4–76.8)
NEUTROS ABS: 3.5 10*3/uL (ref 1.5–6.5)
Platelets: 240 10*3/uL (ref 145–400)
RBC: 4.6 10*6/uL (ref 3.70–5.45)
RDW: 12.9 % (ref 11.2–14.5)
WBC: 5.6 10*3/uL (ref 3.9–10.3)

## 2017-01-07 LAB — COMPREHENSIVE METABOLIC PANEL
ALBUMIN: 3.7 g/dL (ref 3.5–5.0)
ALK PHOS: 64 U/L (ref 40–150)
ALT: 26 U/L (ref 0–55)
AST: 19 U/L (ref 5–34)
Anion Gap: 9 mEq/L (ref 3–11)
BILIRUBIN TOTAL: 0.5 mg/dL (ref 0.20–1.20)
BUN: 16.8 mg/dL (ref 7.0–26.0)
CO2: 23 mEq/L (ref 22–29)
Calcium: 9.1 mg/dL (ref 8.4–10.4)
Chloride: 106 mEq/L (ref 98–109)
Creatinine: 0.8 mg/dL (ref 0.6–1.1)
EGFR: 89 mL/min/{1.73_m2} — ABNORMAL LOW (ref >90)
GLUCOSE: 107 mg/dL (ref 70–140)
Potassium: 3.9 mEq/L (ref 3.5–5.1)
SODIUM: 138 meq/L (ref 136–145)
TOTAL PROTEIN: 6.9 g/dL (ref 6.4–8.3)

## 2017-01-07 MED ORDER — ONDANSETRON HCL 8 MG PO TABS: 8.0000 mg | ORAL_TABLET | Freq: Two times a day (BID) | ORAL | 1 refills | Status: DC | PRN

## 2017-01-07 MED ORDER — PROCHLORPERAZINE MALEATE 10 MG PO TABS: 10.0000 mg | ORAL_TABLET | Freq: Four times a day (QID) | ORAL | 1 refills | Status: DC | PRN

## 2017-01-07 MED ORDER — LIDOCAINE-PRILOCAINE 2.5-2.5 % EX CREA: TOPICAL_CREAM | CUTANEOUS | 3 refills | Status: DC

## 2017-01-07 NOTE — Progress Notes (Signed)
START ON PATHWAY REGIMEN - Breast   Doxorubicin + Cyclophosphamide (AC):   A cycle is every 21 days:     Doxorubicin      Cyclophosphamide   **Always confirm dose/schedule in your pharmacy ordering system**    Paclitaxel 80 mg/m2 Weekly:   Administer weekly:     Paclitaxel   **Always confirm dose/schedule in your pharmacy ordering system**    Patient Characteristics: Preoperative or Nonsurgical Candidate (Clinical Staging), Neoadjuvant Therapy followed by Surgery, Invasive Disease, Chemotherapy, HER2 Negative/Unknown/Equivocal, ER Positive Therapeutic Status: Preoperative or Nonsurgical Candidate (Clinical Staging) AJCC M Category: cM0 AJCC Grade: G3 Breast Surgical Plan: Neoadjuvant Therapy followed by Surgery ER Status: Positive (+) AJCC 8 Stage Grouping: IIIA HER2 Status: Negative (-) AJCC T Category: cT2 AJCC N Category: cN1 PR Status: Negative (-) Intent of Therapy: Curative Intent, Discussed with Patient

## 2017-01-07 NOTE — Progress Notes (Signed)
Nutrition Assessment  Reason for Assessment:  Pt seen in Breast Clinic  ASSESSMENT:   41 year old female with new diagnosis of breast cancer.  Past medical history reviewed.  Patient reports normal appetite  Medications:  reviewed  Labs: reviewed  Anthropometrics:   Height: 64 inches Weight: 183 lb 9.6 oz BMI: 31.6   NUTRITION DIAGNOSIS: Food and nutrition related knowledge deficit related to new diagnosis of breast cancer as evidenced by no prior need for nutrition related information.  INTERVENTION:   Discussed and provided packet of information regarding nutritional tips for breast cancer patients.  Questions answered.  Teachback method used.  Contact information provided and patient knows to contact me with questions/concerns.    MONITORING, EVALUATION, and GOAL: Pt will consume a healthy plant based diet to maintain lean body mass throughout treatment.   Rasha Ibe B. Zenia Resides, [place], [place] Registered Dietitian [phone] (pager)

## 2017-01-07 NOTE — Progress Notes (Signed)
Radiation Oncology         [phone] ________________________________  Name: TYAIRA HEWARD MRN: [id number]  Date: 01/07/2017  DOB: [birth year]  [id number], [name], MD  [name], MD     REFERRING PHYSICIAN: [name], MD   DIAGNOSIS: The encounter diagnosis was Malignant neoplasm of upper-outer quadrant of left breast in female, estrogen receptor positive ([place]).   HISTORY OF PRESENT ILLNESS: FLORENCE YEUNG is a 41 y.o. female seen in the multidisciplinary breast conference for a new diagnosis of left breast cancer. The patient noted a palpable mass in the left breast for about 3 months. She proceeded with diagnostic mammography and ultrasound which revealed a 3 x 2.3 x 1.6 cm mass in the posterior breast as well as some anterior calcifications as well as an enlarged node measuring 2.2 cm in the left axilla. A biopsy of the calcifications was negative for malignancy and concordant by pathology. The mass and axillary node were biopsied as well on 12/29/16 which revealed a grade 3, invasive ductal carcinoma. ER was weakly positive at 15%, PR was negative, HER2 was negative and Ki 67 was 10%. She comes today to discuss options of treatment for her cancer.     PREVIOUS RADIATION THERAPY: No   PAST MEDICAL HISTORY:  Past Medical History:  Diagnosis Date  . Dermatologic problem    possible psoriasis. Pt has not had been diagnosed by dermatology.  . Tobacco dependence    trying to quit with nicotene patches       PAST SURGICAL HISTORY: Past Surgical History:  Procedure Laterality Date  . CESAREAN SECTION       FAMILY HISTORY:  Family History  Problem Relation Age of Onset  . Stroke Maternal Grandmother      SOCIAL HISTORY:  reports that she quit smoking about 2 weeks ago. Her smoking use included Cigarettes. She smoked 0.25 packs per day. She has never used smokeless tobacco. She reports that she does not drink alcohol or use drugs. The patient is married.  She works for [employer].    ALLERGIES: Amoxicillin   MEDICATIONS:  Current Outpatient Prescriptions  Medication Sig Dispense Refill  . nicotine polacrilex (COMMIT) 2 MG lozenge Take 2 mg by mouth as needed.     No current facility-administered medications for this encounter.      REVIEW OF SYSTEMS: On review of systems, the patient reports that she is doing well overall. She denies any chest pain, shortness of breath, cough, fevers, chills, night sweats, unintended weight changes. She denies any bowel or bladder disturbances, and denies abdominal pain, nausea or vomiting. She denies any new musculoskeletal or joint aches or pains. A complete review of systems is obtained and is otherwise negative.     PHYSICAL EXAM:  Wt Readings from Last 3 Encounters:  01/07/17 183 lb 9.6 oz (83.3 kg)  12/25/16 185 lb (83.9 kg)  12/12/16 181 lb 12.8 oz (82.5 kg)   Temp Readings from Last 3 Encounters:  01/07/17 98.1 F (36.7 C) (Oral)  12/25/16 98.4 F (36.9 C) (Oral)  12/12/16 98 F (36.7 C) (Oral)   BP Readings from Last 3 Encounters:  01/07/17 118/72  12/25/16 124/78  12/12/16 127/81   Pulse Readings from Last 3 Encounters:  01/07/17 84  12/12/16 81    In general this is a well appearing caucasian female in no acute distress. She is alert and oriented x4 and appropriate throughout the examination. HEENT reveals that the patient is normocephalic, atraumatic. EOMs are intact.  PERRLA. Skin is intact and several plaques along her left elbow are noted. Cardiovascular exam reveals a regular rate and rhythm, no clicks rubs or murmurs are auscultated. Chest is clear to auscultation bilaterally. Lymphatic assessment is performed and does not reveal any adenopathy in the cervical, supraclavicular,  or inguinal chains. There is fullness in the left axilla.  Bilateral breast exam is performed and reveals no palpable masses in the right breast. The left breast has a large hematoma at the 10  o'clock position and this is about 4 cm, and a 2-3 cm area of fullness consistent with her tumor posteriorly in the upper outer quadrant of the breast is noted but this is mobile. No nipple bleeding or discharge is noted.  Abdomen has active bowel sounds in all quadrants and is intact. The abdomen is soft, non tender, non distended. Lower extremities are negative for pretibial pitting edema, deep calf tenderness, cyanosis or clubbing.   ECOG = 1  0 - Asymptomatic (Fully active, able to carry on all predisease activities without restriction)  1 - Symptomatic but completely ambulatory (Restricted in physically strenuous activity but ambulatory and able to carry out work of a light or sedentary nature. For example, light housework, office work)  2 - Symptomatic, <50% in bed during the day (Ambulatory and capable of all self care but unable to carry out any work activities. Up and about more than 50% of waking hours)  3 - Symptomatic, >50% in bed, but not bedbound (Capable of only limited self-care, confined to bed or chair 50% or more of waking hours)  4 - Bedbound (Completely disabled. Cannot carry on any self-care. Totally confined to bed or chair)  5 - Death   Eustace Pen MM, Creech RH, Tormey DC, et al. [phone]). "Toxicity and response criteria of the [facility]". [place] Oncol. 5 (6): 649-55    LABORATORY DATA:  Lab Results  Component Value Date   WBC 5.6 01/07/2017   HGB 13.6 01/07/2017   HCT 39.0 01/07/2017   MCV 84.8 01/07/2017   PLT 240 01/07/2017   Lab Results  Component Value Date   NA 138 01/07/2017   K 3.9 01/07/2017   CO2 23 01/07/2017   Lab Results  Component Value Date   ALT 26 01/07/2017   AST 19 01/07/2017   ALKPHOS 64 01/07/2017   BILITOT 0.50 01/07/2017      RADIOGRAPHY: US Breast Ltd Uni Left Inc Axilla  Result Date: 12/25/2016 CLINICAL DATA:  Palpable abnormality in the left breast, first noted 3 months ago. Mass associated with  mild tenderness. EXAM: 2D DIGITAL DIAGNOSTIC BILATERAL MAMMOGRAM WITH CAD AND ADJUNCT TOMO ULTRASOUND LEFT BREAST COMPARISON:  Baseline evaluation ACR Breast Density Category c: The breast tissue is heterogeneously dense, which may obscure small masses. FINDINGS: Within the upper-outer quadrant of the left breast, there is a spiculated mass marked as palpable with a BB. Within the upper central portion of the left breast there is a 5 mm group of fine pleomorphic calcifications not associated with mass or distortion. Right breast is negative. Mammographic images were processed with CAD. On physical exam, I palpate a discrete rounded non mobile mass in the upper-outer quadrant of the left breast. Targeted ultrasound is performed, showing an irregular hypoechoic mass in the 130 o'clock location of the left breast 8 cm from the nipple. Mass is associated with posterior acoustic enhancement and shadowing and measures 2.3 x 1.6 x 3.0 cm. There is internal vascularity on Doppler  evaluation. Adjacent simple cyst is 1.9 cm. Evaluation of the left axilla shows a single enlarged lymph node lacking fatty hilum and measuring 2.2 cm and associated trans capsular blood flow on Doppler evaluation. IMPRESSION: 1. Suspicious mass in the 130 o'clock location of the left breast for which biopsy is indicated. 2. Suspicious left axillary lymph node for which biopsy is indicated. 3. Indeterminate group of calcifications in the upper central portion of the left breast for which biopsy is recommended. RECOMMENDATION: 1. Ultrasound-guided core biopsy of mass in the 130 o'clock location of the left breast. 2. Ultrasound-guided core biopsy of left axillary lymph node. 3. Stereotactic guided core biopsy of left breast calcifications. I have discussed the findings and recommendations with the patient and her husband. Results were also provided in writing at the conclusion of the visit. If applicable, a reminder letter will be sent to the patient  regarding the next appointment. BI-RADS CATEGORY  5: Highly suggestive of malignancy. Electronically Signed   By: Nolon Nations M.D.   On: 12/25/2016 10:53   Mm Diag Breast Tomo Bilateral  Result Date: 12/25/2016 CLINICAL DATA:  Palpable abnormality in the left breast, first noted 3 months ago. Mass associated with mild tenderness. EXAM: 2D DIGITAL DIAGNOSTIC BILATERAL MAMMOGRAM WITH CAD AND ADJUNCT TOMO ULTRASOUND LEFT BREAST COMPARISON:  Baseline evaluation ACR Breast Density Category c: The breast tissue is heterogeneously dense, which may obscure small masses. FINDINGS: Within the upper-outer quadrant of the left breast, there is a spiculated mass marked as palpable with a BB. Within the upper central portion of the left breast there is a 5 mm group of fine pleomorphic calcifications not associated with mass or distortion. Right breast is negative. Mammographic images were processed with CAD. On physical exam, I palpate a discrete rounded non mobile mass in the upper-outer quadrant of the left breast. Targeted ultrasound is performed, showing an irregular hypoechoic mass in the 130 o'clock location of the left breast 8 cm from the nipple. Mass is associated with posterior acoustic enhancement and shadowing and measures 2.3 x 1.6 x 3.0 cm. There is internal vascularity on Doppler evaluation. Adjacent simple cyst is 1.9 cm. Evaluation of the left axilla shows a single enlarged lymph node lacking fatty hilum and measuring 2.2 cm and associated trans capsular blood flow on Doppler evaluation. IMPRESSION: 1. Suspicious mass in the 130 o'clock location of the left breast for which biopsy is indicated. 2. Suspicious left axillary lymph node for which biopsy is indicated. 3. Indeterminate group of calcifications in the upper central portion of the left breast for which biopsy is recommended. RECOMMENDATION: 1. Ultrasound-guided core biopsy of mass in the 130 o'clock location of the left breast. 2.  Ultrasound-guided core biopsy of left axillary lymph node. 3. Stereotactic guided core biopsy of left breast calcifications. I have discussed the findings and recommendations with the patient and her husband. Results were also provided in writing at the conclusion of the visit. If applicable, a reminder letter will be sent to the patient regarding the next appointment. BI-RADS CATEGORY  5: Highly suggestive of malignancy. Electronically Signed   By: Nolon Nations M.D.   On: 12/25/2016 10:53   [place] Axillary Node Core Biopsy Left  Addendum Date: 12/30/2016   ADDENDUM REPORT: 12/30/2016 14:54 ADDENDUM: Pathology revealed FIBROCYSTIC CHANGES INCLUDING APOCRINE METAPLASIA WITH CALCIFICATIONS of the Left breast, upper inner quadrant. This was found to be concordant byDr. Ammie Ferrier. 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 This was found to be concordant by Dr. Ammie Ferrier. Pathology results were discussed with the patient by telephone. The patient reported doing well after the biopsies with tenderness and minimal bleeding at the sites. Post biopsy instructions and care were reviewed and questions were answered. The patient was encouraged to call [facility] for any additional concerns. The patient was referred to [facility] at [facility] on January 07, 2017. Pathology results reported by Terie Purser, RN on 12/30/2016. Electronically Signed   By: Ammie Ferrier M.D.   On: 12/30/2016 14:54   Result Date: 12/30/2016 CLINICAL DATA:  41 year old female presenting for ultrasound-guided biopsy of a left breast mass. EXAM: ULTRASOUND GUIDED LEFT BREAST CORE NEEDLE BIOPSY COMPARISON:  Previous exam(s). PROCEDURE: I met with the patient and we discussed the procedure of ultrasound-guided biopsy, including benefits and  alternatives. We discussed the high likelihood of a successful procedure. We discussed the risks of the procedure including infection, bleeding, tissue injury, clip migration, and inadequate sampling. Informed written consent was given. The usual time-out protocol was performed immediately prior to the procedure. Lesion quadrant: Upper-outer quadrant Using sterile technique and 1% Lidocaine as local anesthetic, under direct ultrasound visualization, a 12 gauge spring-loaded device was used to perform biopsy of mass in the left breast at 1:30using a superior approach. At the conclusion of the procedure, a ribbon shaped tissue marker clip was deployed into the biopsy cavity. Lesion quadrant: Upper-outer quadrant Using sterile technique and 1% Lidocaine as local anesthetic, under direct ultrasound visualization, a 14 gauge spring-loaded device was used to perform biopsy of a left axillary lymph nodeusing an inferior approach. At the conclusion of the procedure, a spiral shaped tissue marker clip was deployed into the biopsy cavity. Follow-up 2-view mammogram was performed and dictated separately. IMPRESSION: 1. Ultrasound-guided biopsy of mass in the upper-outer quadrant of the left breast at 1:30. No apparent complications. 2. Ultrasound-guided biopsy of a a left axillary lymph node. No apparent complications. Electronically Signed: By: Ammie Ferrier M.D. On: 12/29/2016 14:34   Mm Clip Placement Left  Result Date: 12/29/2016 CLINICAL DATA:  Post biopsy mammogram of the left breast for clip placement. EXAM: DIAGNOSTIC LEFT MAMMOGRAM POST ULTRASOUND BIOPSY COMPARISON:  Previous exam(s). FINDINGS: Mammographic images were obtained following stereotactic biopsy of left breast calcifications, an ultrasound guided biopsy of left breast mass at 1:30 and a left axillary lymph node. The coil shaped biopsy marking clip is appropriately positioned at the site of biopsied calcifications in the upper left breast. The  ribbon shaped biopsy marking clip is positioned within the biopsied mass in the upper-outer left breast. The spiral shaped biopsy marking clip in the lymph node of the left axilla cannot be visualized due to its deep positioning. IMPRESSION: 1. The coil shaped biopsy marking clip is position at the site of biopsied calcifications in the superior left breast. 2. The ribbon shaped biopsy marking clip is appropriately positioned within the mass in the upper-outer left breast. 3. Non visualization of the spiral shaped biopsy marking clip which was placed in the left axillary lymph node. This was well visualized sonographically. Final Assessment: Post Procedure Mammograms for Marker Placement Electronically Signed   By: Ammie Ferrier M.D.   On: 12/29/2016 14:35   Mm Lt Breast Bx W Loc Dev 1st Lesion Image Bx Spec Stereo Guide  Addendum Date: 12/30/2016   ADDENDUM REPORT: 12/30/2016 14:55 ADDENDUM: Pathology  revealed FIBROCYSTIC CHANGES INCLUDING APOCRINE METAPLASIA WITH CALCIFICATIONS of the Left breast, upper inner quadrant. This was found to be concordant byDr. Ammie Ferrier. 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 This was found to be concordant by Dr. Ammie Ferrier. Pathology results were discussed with the patient by telephone. The patient reported doing well after the biopsies with tenderness and minimal bleeding at the sites. Post biopsy instructions and care were reviewed and questions were answered. The patient was encouraged to call [facility] for any additional concerns. The patient was referred to [facility] on January 07, 2017. Pathology results reported by Terie Purser, RN on 12/30/2016. Electronically Signed   By: Ammie Ferrier M.D.   On: 12/30/2016 14:55   Result Date:  12/30/2016 CLINICAL DATA:  41 year old female presenting for stereotactic biopsy of left breast calcifications. EXAM: LEFT BREAST STEREOTACTIC CORE NEEDLE BIOPSY COMPARISON:  Previous exams. FINDINGS: The patient and I discussed the procedure of stereotactic-guided biopsy including benefits and alternatives. We discussed the high likelihood of a successful procedure. We discussed the risks of the procedure including infection, bleeding, tissue injury, clip migration, and inadequate sampling. Informed written consent was given. The usual time out protocol was performed immediately prior to the procedure. Using sterile technique and 1% Lidocaine as local anesthetic, under stereotactic guidance, a 9 gauge vacuum assisted device was used to perform core needle biopsy of calcifications in the upper inner quadrant of the left breast using a superior approach. Specimen radiograph was performed showing calcifications within multiple core samples. Specimens with calcifications are identified for pathology. Lesion quadrant: Upper inner quadrant At the conclusion of the procedure, a coil shaped tissue marker clip was deployed into the biopsy cavity. Follow-up 2-view mammogram was performed and dictated separately. IMPRESSION: Stereotactic-guided biopsy of calcifications in the upper inner left breast. No apparent complications. Electronically Signed: By: Ammie Ferrier M.D. On: 12/29/2016 13:23   [place] Lt Breast Bx W Loc Dev 1st Lesion Img Bx Spec US Guide  Addendum Date: 12/30/2016   ADDENDUM REPORT: 12/30/2016 14:54 ADDENDUM: Pathology revealed FIBROCYSTIC CHANGES INCLUDING APOCRINE METAPLASIA WITH CALCIFICATIONS of the Left breast, upper inner quadrant. This was found to be concordant byDr. Ammie Ferrier. 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 This was found to be concordant by Dr. Ammie Ferrier. Pathology  results were discussed with the patient by telephone. The patient reported doing well after the biopsies with tenderness and minimal bleeding at the sites. Post biopsy instructions and care were reviewed and questions were answered. The patient was encouraged to call [facility] for any additional concerns. The patient was referred to [facility] at [facility] on January 07, 2017. Pathology results reported by Terie Purser, RN on 12/30/2016. Electronically Signed   By: Ammie Ferrier M.D.   On: 12/30/2016 14:54   Result Date: 12/30/2016 CLINICAL DATA:  41 year old female presenting for ultrasound-guided biopsy of a left breast mass. EXAM: ULTRASOUND GUIDED LEFT BREAST CORE NEEDLE BIOPSY COMPARISON:  Previous exam(s). PROCEDURE: I met with the patient and we discussed the procedure of ultrasound-guided biopsy, including benefits and alternatives. We discussed the high likelihood of a successful procedure. We discussed the risks of the procedure including infection, bleeding, tissue  injury, clip migration, and inadequate sampling. Informed written consent was given. The usual time-out protocol was performed immediately prior to the procedure. Lesion quadrant: Upper-outer quadrant Using sterile technique and 1% Lidocaine as local anesthetic, under direct ultrasound visualization, a 12 gauge spring-loaded device was used to perform biopsy of mass in the left breast at 1:30using a superior approach. At the conclusion of the procedure, a ribbon shaped tissue marker clip was deployed into the biopsy cavity. Lesion quadrant: Upper-outer quadrant Using sterile technique and 1% Lidocaine as local anesthetic, under direct ultrasound visualization, a 14 gauge spring-loaded device was used to perform biopsy of a left axillary lymph nodeusing an inferior approach. At the conclusion of the procedure, a spiral shaped tissue marker clip was  deployed into the biopsy cavity. Follow-up 2-view mammogram was performed and dictated separately. IMPRESSION: 1. Ultrasound-guided biopsy of mass in the upper-outer quadrant of the left breast at 1:30. No apparent complications. 2. Ultrasound-guided biopsy of a a left axillary lymph node. No apparent complications. Electronically Signed: By: Ammie Ferrier M.D. On: 12/29/2016 14:34       IMPRESSION/PLAN: 1. 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 Dr. Lisbeth Nale discusses the pathology findings and reviews the nature of invasive ductal disease and the impact given the weakly positive ER status; in this manner the behavior of her cancer may be more in line with triple negative disease, and such, Dr. Burr Medico recommends a course of neoadjuvant chemotherapy after meeting with genetics. She will undergo MRI of the breasts and port a cath placement to receive neoadjuvant chemotherapy. Provided that her disease improves with this therapy, she would be considered for breast conservation with lumpectomy and targeted lymph node dissection. After surgery, her course would be followed by external radiotherapy to the breast and regional nodes, and possible consideration for followed by antiestrogen therapy. We discussed the risks, benefits, short, and long term effects of radiotherapy, and the patient is interested in proceeding. Dr. Lisbeth Albee discusses the delivery and logistics of radiotherapy and would recommend a course of 6 1/2 weeks of treatment to the breast and regional nodes and would proceed with deep inspiration breath hold technique for simulation and treatment. We will plan to see her back about 2 weeks after surgery to move forward with the simulation and planning process and anticipate starting radiotherapy about 4-6 weeks after surgery.  2. Possible genetic predisposition to malignancy. The patient will meet with genetics given her diagnosis and  young age. This information may impact surgical decision making moving forward. 3. Contraception. The patient is cautioned regarding avoiding pregnancy. She will discuss further options with Dr. Burr Medico and her OBGYN.  The above documentation reflects my direct findings during this shared patient visit. Please see the separate note by Dr. Lisbeth Gammage on this date for the remainder of the patient's plan of care.    Carola Rhine, PAC

## 2017-01-07 NOTE — Patient Instructions (Signed)

## 2017-01-07 NOTE — Therapy (Signed)
[place], [place], [address] Phone: [phone]   Fax:  [phone]  Physical Therapy Evaluation  Patient Details  Name: Kathryn Stanley MRN: [id number] Date of Birth: 1976-02-02 Referring Provider: Dr. [name]  Encounter Date: 01/07/2017      PT End of Session - 01/07/17 1319    Visit Number 1   Number of Visits 1   PT Start Time 3016   PT Stop Time 0956  Also saw pt from 1120-1148 for a total of 33 minutes   PT Time Calculation (min) 5 min   Activity Tolerance Patient tolerated treatment well   Behavior During Therapy [facility] for tasks assessed/performed      Past Medical History:  Diagnosis Date  . Dermatologic problem    possible psoriasis. Pt has not had been diagnosed by dermatology.  . Tobacco dependence    trying to quit with nicotene patches    Past Surgical History:  Procedure Laterality Date  . CESAREAN SECTION      There were no vitals filed for this visit.       Subjective Assessment - 01/07/17 1244    Subjective Patient reports she is here today to be seen by her medical team for her newly diagnosed left breast cancer.   Patient is accompained by: Family member   Pertinent History Patient was diagnosed on 12/25/16 with left grade 3 invasive ductal carcinoma breast cancer. It measures 3 cm and is located in the upper outer quadrant. It is ER positive, PR negative, and HER2 negative with a Ki67 of 15%. She has a biopsied positive axillary lymph node.   Patient Stated Goals Reduce lymphedema risk and learn post op shoulder ROM HEP.   Currently in Pain? No/denies            [facility] PT Assessment - 01/07/17 0001      Assessment   Medical Diagnosis Left breast cancer   Referring Provider Dr. [name]   Onset Date/Surgical Date 12/25/16   Hand Dominance Left   Prior Therapy none     Precautions   Precautions Other (comment)     Restrictions   Weight  Bearing Restrictions No     Balance Screen   Has the patient fallen in the past 6 months No   Has the patient had a decrease in activity level because of a fear of falling?  No   Is the patient reluctant to leave their home because of a fear of falling?  No     Home Environment   Living Environment Private residence   Living Arrangements Spouse/significant other;Children  Husband and 53 y.o. 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#   Leisure She has been running 15 min 3x/week and walks 15-30 min daily     Cognition   Overall Cognitive Status Within Functional Limits for tasks assessed     Posture/Postural Control   Posture/Postural Control Postural limitations   Postural Limitations Rounded Shoulders;Forward head     ROM / Strength   AROM / PROM / Strength AROM;Strength     AROM   AROM Assessment Site Shoulder;Cervical   Right/Left Shoulder Right;Left   Right Shoulder Extension 43 Degrees   Right Shoulder Flexion 142 Degrees   Right Shoulder ABduction 155 Degrees   Right  Shoulder Internal Rotation 57 Degrees   Right Shoulder External Rotation 77 Degrees   Left Shoulder Extension 48 Degrees   Left Shoulder Flexion 153 Degrees   Left Shoulder ABduction 160 Degrees   Left Shoulder Internal Rotation 70 Degrees   Left Shoulder External Rotation 75 Degrees   Cervical Flexion WNL   Cervical Extension WNL   Cervical - Right Side Bend WNL   Cervical - Left Side Bend WNL   Cervical - Right Rotation WNL   Cervical - Left Rotation WNL     Strength   Overall Strength Within functional limits for tasks performed           LYMPHEDEMA/ONCOLOGY QUESTIONNAIRE - 01/07/17 1317      Type   Cancer Type Left breast cancer     Lymphedema Assessments   Lymphedema Assessments Upper extremities     Right Upper  Extremity Lymphedema   10 cm Proximal to Olecranon Process 30.5 cm   Olecranon Process 25.8 cm   10 cm Proximal to Ulnar Styloid Process 25.4 cm   Just Proximal to Ulnar Styloid Process 17 cm   Across Hand at [employer] 21.4 cm   At [place] of 2nd Digit 6.8 cm     Left Upper Extremity Lymphedema   10 cm Proximal to Olecranon Process 29.8 cm   Olecranon Process 24.9 cm   10 cm Proximal to Ulnar Styloid Process 24 cm   Just Proximal to Ulnar Styloid Process 16.9 cm   Across Hand at [employer] 20.7 cm   At [place] of 2nd Digit 6.3 cm         Objective measurements completed on examination: See above findings.          Patient was instructed today in a home exercise program today for post op shoulder range of motion. These included active assist shoulder flexion in sitting, scapular retraction, wall walking with shoulder abduction, and hands behind head external rotation.  She was encouraged to do these twice a day, holding 3 seconds and repeating 5 times when permitted by her physician.            PT Education - 01/07/17 1318    Education provided Yes   Education Details Lymphedema risk reduction and post op shoulder ROM HEP   Person(s) Educated Patient;Spouse   Methods Explanation;Demonstration;Handout   Comprehension Returned demonstration;Verbalized understanding              Breast Clinic Goals - 01/07/17 1331      Patient will be able to verbalize understanding of pertinent lymphedema risk reduction practices relevant to her diagnosis specifically related to skin care.   Time 1   Period Days   Status Achieved     Patient will be able to return demonstrate and/or verbalize understanding of the post-op home exercise program related to regaining shoulder range of motion.   Time 1   Period Days   Status Achieved     Patient will be able to verbalize understanding of the importance of attending the postoperative After Breast Cancer Class for further  lymphedema risk reduction education and therapeutic exercise.   Time 1   Period Days   Status Achieved               Plan - 01/07/17 1322    Clinical Impression Statement Patient was diagnosed on 12/25/16 with left grade 3 invasive ductal carcinoma breast cancer. It measures 3 cm and is located in the upper outer quadrant.  It is ER positive, PR negative, and HER2 negative with a Ki67 of 15%. She has a biopsied positive axillary lymph node. Her multidisciplinary medical team met prior to her assessments to determine a recommended treatment plan. She is planning to have neoadjuvant chemotherapy followed by a possible left lumpectomy and targeted axillary node dissection followed by radiation and anti-estrogen therapy. She will benefit from post op PT to regain shoulder ROM and reduce lymphedema risk.    History and Personal Factors relevant to plan of care: None   Clinical Presentation Evolving   Clinical Presentation due to: Staging scans are planned to determine extent of disease   Clinical Decision Making Low   Rehab Potential Excellent   Clinical Impairments Affecting Rehab Potential None   PT Frequency One time visit   PT Treatment/Interventions Patient/family education;Therapeutic exercise   PT Next Visit Plan Will f/u after surgery to determine PT needs   PT Home Exercise Plan post op shoulder ROM HEP   Consulted and Agree with Plan of Care Patient;Family member/caregiver   Family Member Consulted Husband      Patient will benefit from skilled therapeutic intervention in order to improve the following deficits and impairments:  Postural dysfunction, Decreased knowledge of precautions, Pain, Impaired UE functional use, Decreased range of motion  Visit Diagnosis: Carcinoma of upper-outer quadrant of left breast in female, estrogen receptor positive ([place]) - Plan: PT plan of care cert/re-cert  Abnormal posture - Plan: PT plan of care cert/re-cert   Patient will follow up at  outpatient cancer rehab if needed following surgery.  If the patient requires physical therapy at that time, a specific plan will be dictated and sent to the referring physician for approval. The patient was educated today on appropriate basic range of motion exercises to begin post operatively and the importance of attending the After Breast Cancer class following surgery.  Patient was educated today on lymphedema risk reduction practices as it pertains to recommendations that will benefit the patient immediately following surgery.  She verbalized good understanding.  No additional physical therapy is indicated at this time.      Problem List Patient Active Problem List   Diagnosis Date Noted  . Malignant neoplasm of upper-outer quadrant of left breast in female, estrogen receptor positive ([place]) 01/05/2017  . Tobacco use 12/12/2016    Annia Friendly, PT 01/07/17 1:33 PM  [place], [place], [address] Phone: [phone]   Fax:  [phone]  Name: Kathryn Stanley MRN: [id number] Date of Birth: [birth year]

## 2017-01-07 NOTE — Progress Notes (Signed)
Clinical Social Work [place] Psychosocial Distress Screening [place]  Patient completed distress screening protocol and scored a 5 on the Psychosocial Distress Thermometer which indicates ## distress. Clinical Social Worker met with patient and patients husband in [facility] to assess for distress and other psychosocial needs. Patient stated she was feeling overwhelmed but felt "better" after meeting with the treatment team and getting more information on her treatment plan. CSW and patient discussed common feeling and emotions when being diagnosed with cancer, and the importance of support during treatment. CSW informed patient of the support team and support services at [facility], and patient was agreeable to an [employer] referral. CSW provided contact information and encouraged patient to call with any questions or concerns.  ONCBCN DISTRESS SCREENING 01/07/2017  Screening Type Initial Screening  Distress experienced in past week (1-10) 5  Practical problem type Work/school;Insurance  Emotional problem type Nervousness/Anxiety;Adjusting to illness  Spiritual/Religous concerns type Facing my mortality  Information Concerns Type Lack of info about diagnosis;Lack of info about treatment;Lack of info about complementary therapy choices;Lack of info about maintaining fitness  Physician notified of physical symptoms Yes  Referral to support programs Yes     Johnnye Lana, MSW, LCSW, OSW-C Clinical Social Worker [facility] [phone]

## 2017-01-08 ENCOUNTER — Telehealth: Payer: Self-pay | Admitting: *Deleted

## 2017-01-08 ENCOUNTER — Telehealth: Payer: Self-pay | Admitting: Hematology

## 2017-01-08 NOTE — Telephone Encounter (Signed)
Confirmed echo and chemo class for 8/7 at 930 for chemo class and 11am echo.

## 2017-01-08 NOTE — Telephone Encounter (Signed)
Spoke with patients about their upcoming appointments next week.

## 2017-01-09 ENCOUNTER — Encounter: Payer: Self-pay | Admitting: *Deleted

## 2017-01-12 ENCOUNTER — Telehealth: Payer: Self-pay | Admitting: *Deleted

## 2017-01-12 ENCOUNTER — Encounter (HOSPITAL_COMMUNITY): Payer: Self-pay | Admitting: *Deleted

## 2017-01-12 NOTE — Telephone Encounter (Signed)
Spoke with patient's husband [name]. He states patient is doing well.  Encouraged them to call with any needs or concerns.

## 2017-01-12 NOTE — Progress Notes (Signed)
Spoke with pt for pre-op call. Pt denies cardiac history or diabetes.

## 2017-01-13 ENCOUNTER — Other Ambulatory Visit: Payer: No Typology Code available for payment source

## 2017-01-13 ENCOUNTER — Ambulatory Visit (HOSPITAL_COMMUNITY)
Admission: RE | Admit: 2017-01-13 | Discharge: 2017-01-13 | Disposition: A | Discharge status: Home / Self Care | Payer: Medicaid Other | Source: Ambulatory Visit | Attending: Hematology | Admitting: Hematology

## 2017-01-13 ENCOUNTER — Encounter: Payer: Self-pay | Admitting: *Deleted

## 2017-01-13 DIAGNOSIS — K219 Gastro-esophageal reflux disease without esophagitis: Secondary | ICD-10-CM | POA: Diagnosis not present

## 2017-01-13 DIAGNOSIS — C50412 Malignant neoplasm of upper-outer quadrant of left female breast: Secondary | ICD-10-CM | POA: Insufficient documentation

## 2017-01-13 DIAGNOSIS — Z170 Estrogen receptor positive status [ER+]: Secondary | ICD-10-CM | POA: Insufficient documentation

## 2017-01-13 DIAGNOSIS — Z87891 Personal history of nicotine dependence: Secondary | ICD-10-CM | POA: Insufficient documentation

## 2017-01-13 MED ORDER — GADOBENATE DIMEGLUMINE 529 MG/ML IV SOLN
20.0000 mL | Freq: Once | INTRAVENOUS | Status: AC | PRN
  Administered 2017-01-13: 17 mL via INTRAVENOUS

## 2017-01-13 MED ORDER — CIPROFLOXACIN IN D5W 400 MG/200ML IV SOLN
400.0000 mg | INTRAVENOUS | Status: AC
  Administered 2017-01-14: 400 mg via INTRAVENOUS
  Filled 2017-01-13: qty 200

## 2017-01-13 MED FILL — LIDOCAINE-PRILOCAINE CREAM: 2.5-2.5 | 15 days supply | Qty: 30 | Fill #0

## 2017-01-13 NOTE — Progress Notes (Signed)
  Echocardiogram 2D Echocardiogram has been performed.  Kathryn Stanley 01/13/2017, 11:37 AM

## 2017-01-14 ENCOUNTER — Telehealth (HOSPITAL_COMMUNITY): Payer: Self-pay | Admitting: *Deleted

## 2017-01-14 ENCOUNTER — Other Ambulatory Visit: Payer: No Typology Code available for payment source

## 2017-01-14 ENCOUNTER — Ambulatory Visit (HOSPITAL_COMMUNITY): Payer: Medicaid Other

## 2017-01-14 ENCOUNTER — Ambulatory Visit (HOSPITAL_COMMUNITY)
Admission: RE | Admit: 2017-01-14 | Discharge: 2017-01-14 | Disposition: A | Discharge status: Home / Self Care | Payer: Medicaid Other | Source: Ambulatory Visit | Attending: General Surgery | Admitting: General Surgery

## 2017-01-14 ENCOUNTER — Encounter (HOSPITAL_COMMUNITY)
Admission: RE | Disposition: A | Discharge status: Home / Self Care | Payer: Self-pay | Source: Ambulatory Visit | Attending: General Surgery

## 2017-01-14 ENCOUNTER — Encounter (HOSPITAL_COMMUNITY): Payer: Self-pay

## 2017-01-14 ENCOUNTER — Ambulatory Visit (HOSPITAL_COMMUNITY): Payer: Medicaid Other | Admitting: Anesthesiology

## 2017-01-14 ENCOUNTER — Encounter: Payer: Self-pay | Admitting: Hematology

## 2017-01-14 DIAGNOSIS — Z170 Estrogen receptor positive status [ER+]: Secondary | ICD-10-CM | POA: Insufficient documentation

## 2017-01-14 DIAGNOSIS — Z87891 Personal history of nicotine dependence: Secondary | ICD-10-CM | POA: Insufficient documentation

## 2017-01-14 DIAGNOSIS — C50412 Malignant neoplasm of upper-outer quadrant of left female breast: Secondary | ICD-10-CM

## 2017-01-14 DIAGNOSIS — C50212 Malignant neoplasm of upper-inner quadrant of left female breast: Secondary | ICD-10-CM | POA: Insufficient documentation

## 2017-01-14 DIAGNOSIS — Z95828 Presence of other vascular implants and grafts: Secondary | ICD-10-CM

## 2017-01-14 HISTORY — PX: PORTACATH PLACEMENT: SHX2246

## 2017-01-14 HISTORY — DX: Gastro-esophageal reflux disease without esophagitis: K21.9

## 2017-01-14 LAB — HCG, SERUM, QUALITATIVE: Preg, Serum: NEGATIVE

## 2017-01-14 LAB — BASIC METABOLIC PANEL
Anion gap: 9 (ref 5–15)
BUN: 14 mg/dL (ref 6–20)
CHLORIDE: 108 mmol/L (ref 101–111)
CO2: 21 mmol/L — AB (ref 22–32)
CREATININE: 0.7 mg/dL (ref 0.44–1.00)
Calcium: 8.6 mg/dL — ABNORMAL LOW (ref 8.9–10.3)
GFR calc Af Amer: >60 mL/min (ref >60)
GFR calc non Af Amer: >60 mL/min (ref >60)
GLUCOSE: 98 mg/dL (ref 65–99)
Potassium: 4.1 mmol/L (ref 3.5–5.1)
Sodium: 138 mmol/L (ref 135–145)

## 2017-01-14 LAB — CBC
HEMATOCRIT: 40 % (ref 36.0–46.0)
HEMOGLOBIN: 13.8 g/dL (ref 12.0–15.0)
MCH: 29.1 pg (ref 26.0–34.0)
MCHC: 34.5 g/dL (ref 30.0–36.0)
MCV: 84.2 fL (ref 78.0–100.0)
Platelets: 224 10*3/uL (ref 150–400)
RBC: 4.75 MIL/uL (ref 3.87–5.11)
RDW: 13.2 % (ref 11.5–15.5)
WBC: 6.7 10*3/uL (ref 4.0–10.5)

## 2017-01-14 SURGERY — INSERTION, TUNNELED CENTRAL VENOUS DEVICE, WITH PORT
Anesthesia: General | Site: Neck

## 2017-01-14 MED ORDER — SUCCINYLCHOLINE CHLORIDE 200 MG/10ML IV SOSY
PREFILLED_SYRINGE | INTRAVENOUS | Status: AC
  Filled 2017-01-14: qty 10

## 2017-01-14 MED ORDER — MIDAZOLAM HCL 2 MG/2ML IJ SOLN
INTRAMUSCULAR | Status: AC
  Filled 2017-01-14: qty 2

## 2017-01-14 MED ORDER — HEPARIN SOD (PORK) LOCK FLUSH 100 UNIT/ML IV SOLN
INTRAVENOUS | Status: DC | PRN
  Administered 2017-01-14: 500 [IU] via INTRAVENOUS

## 2017-01-14 MED ORDER — MIDAZOLAM HCL 2 MG/2ML IJ SOLN
INTRAMUSCULAR | Status: DC | PRN
  Administered 2017-01-14: 2 mg via INTRAVENOUS

## 2017-01-14 MED ORDER — CELECOXIB 200 MG PO CAPS
200.0000 mg | ORAL_CAPSULE | ORAL | Status: AC
  Administered 2017-01-14: 200 mg via ORAL
  Filled 2017-01-14: qty 1

## 2017-01-14 MED ORDER — PROPOFOL 10 MG/ML IV BOLUS
INTRAVENOUS | Status: DC | PRN
  Administered 2017-01-14: 180 mg via INTRAVENOUS

## 2017-01-14 MED ORDER — ACETAMINOPHEN 500 MG PO TABS
1000.0000 mg | ORAL_TABLET | ORAL | Status: AC
  Administered 2017-01-14: 1000 mg via ORAL
  Filled 2017-01-14: qty 2

## 2017-01-14 MED ORDER — FENTANYL CITRATE (PF) 250 MCG/5ML IJ SOLN
INTRAMUSCULAR | Status: AC
  Filled 2017-01-14: qty 5

## 2017-01-14 MED ORDER — SODIUM CHLORIDE 0.9 % IV SOLN
INTRAVENOUS | Status: DC | PRN
  Administered 2017-01-14: 11:00:00 500 mL

## 2017-01-14 MED ORDER — ONDANSETRON HCL 4 MG/2ML IJ SOLN
INTRAMUSCULAR | Status: AC
  Filled 2017-01-14: qty 2

## 2017-01-14 MED ORDER — PROPOFOL 10 MG/ML IV BOLUS
INTRAVENOUS | Status: AC
  Filled 2017-01-14: qty 20

## 2017-01-14 MED ORDER — LIDOCAINE 2% (20 MG/ML) 5 ML SYRINGE
INTRAMUSCULAR | Status: DC | PRN
  Administered 2017-01-14: 100 mg via INTRAVENOUS

## 2017-01-14 MED ORDER — ONDANSETRON HCL 4 MG/2ML IJ SOLN
INTRAMUSCULAR | Status: DC | PRN
  Administered 2017-01-14: 4 mg via INTRAVENOUS

## 2017-01-14 MED ORDER — LIDOCAINE 2% (20 MG/ML) 5 ML SYRINGE
INTRAMUSCULAR | Status: AC
  Filled 2017-01-14: qty 5

## 2017-01-14 MED ORDER — FENTANYL CITRATE (PF) 100 MCG/2ML IJ SOLN: 25.0000 ug | INTRAMUSCULAR | Status: DC | PRN

## 2017-01-14 MED ORDER — LACTATED RINGERS IV SOLN
INTRAVENOUS | Status: DC
  Administered 2017-01-14 (×2): via INTRAVENOUS

## 2017-01-14 MED ORDER — BUPIVACAINE-EPINEPHRINE 0.25% -1:200000 IJ SOLN
INTRAMUSCULAR | Status: DC | PRN
  Administered 2017-01-14: 9 mL

## 2017-01-14 MED ORDER — 0.9 % SODIUM CHLORIDE (POUR BTL) OPTIME
TOPICAL | Status: DC | PRN
  Administered 2017-01-14: 1000 mL

## 2017-01-14 MED ORDER — METOCLOPRAMIDE HCL 5 MG/ML IJ SOLN: 10.0000 mg | Freq: Once | INTRAMUSCULAR | Status: DC | PRN

## 2017-01-14 MED ORDER — DEXAMETHASONE SODIUM PHOSPHATE 10 MG/ML IJ SOLN
INTRAMUSCULAR | Status: DC | PRN
  Administered 2017-01-14: 10 mg via INTRAVENOUS

## 2017-01-14 MED ORDER — BUPIVACAINE-EPINEPHRINE (PF) 0.25% -1:200000 IJ SOLN
INTRAMUSCULAR | Status: AC
Start: 2017-01-14 — End: 2017-01-14
  Filled 2017-01-14: qty 30

## 2017-01-14 MED ORDER — MEPERIDINE HCL 25 MG/ML IJ SOLN: 6.2500 mg | INTRAMUSCULAR | Status: DC | PRN

## 2017-01-14 MED ORDER — TRAMADOL HCL 50 MG PO TABS: 50.0000 mg | ORAL_TABLET | Freq: Four times a day (QID) | ORAL | 1 refills | Status: DC | PRN

## 2017-01-14 MED ORDER — DEXAMETHASONE SODIUM PHOSPHATE 10 MG/ML IJ SOLN
INTRAMUSCULAR | Status: AC
  Filled 2017-01-14: qty 1

## 2017-01-14 MED ORDER — GABAPENTIN 300 MG PO CAPS
300.0000 mg | ORAL_CAPSULE | ORAL | Status: AC
  Administered 2017-01-14: 300 mg via ORAL
  Filled 2017-01-14: qty 1

## 2017-01-14 MED ORDER — FENTANYL CITRATE (PF) 100 MCG/2ML IJ SOLN
INTRAMUSCULAR | Status: DC | PRN
  Administered 2017-01-14: 100 ug via INTRAVENOUS

## 2017-01-14 MED ORDER — LACTATED RINGERS IV SOLN: INTRAVENOUS | Status: DC

## 2017-01-14 MED ORDER — HEPARIN SOD (PORK) LOCK FLUSH 100 UNIT/ML IV SOLN
INTRAVENOUS | Status: AC
  Filled 2017-01-14: qty 5

## 2017-01-14 SURGICAL SUPPLY — 50 items
ADH SKN CLS APL DERMABOND .7 (GAUZE/BANDAGES/DRESSINGS) ×1
BAG DECANTER FOR FLEXI CONT (MISCELLANEOUS) ×3 IMPLANT
BLADE SURG 11 STRL SS (BLADE) ×3 IMPLANT
BLADE SURG 15 STRL LF DISP TIS (BLADE) ×1 IMPLANT
BLADE SURG 15 STRL SS (BLADE) ×3
CHLORAPREP W/TINT 26ML (MISCELLANEOUS) ×3 IMPLANT
COVER SURGICAL LIGHT HANDLE (MISCELLANEOUS) ×3 IMPLANT
COVER TRANSDUCER ULTRASND GEL (DRAPE) ×3 IMPLANT
CRADLE DONUT ADULT HEAD (MISCELLANEOUS) ×3 IMPLANT
DECANTER SPIKE VIAL GLASS SM (MISCELLANEOUS) ×3 IMPLANT
DERMABOND ADVANCED (GAUZE/BANDAGES/DRESSINGS) ×2
DERMABOND ADVANCED .7 DNX12 (GAUZE/BANDAGES/DRESSINGS) ×1 IMPLANT
DRAPE C-ARM 42X72 X-RAY (DRAPES) ×3 IMPLANT
DRAPE CHEST BREAST 15X10 FENES (DRAPES) ×3 IMPLANT
DRAPE UTILITY XL STRL (DRAPES) ×3 IMPLANT
ELECT CAUTERY BLADE 6.4 (BLADE) ×3 IMPLANT
ELECT REM PT RETURN 9FT ADLT (ELECTROSURGICAL) ×3
ELECTRODE REM PT RTRN 9FT ADLT (ELECTROSURGICAL) ×1 IMPLANT
GAUZE SPONGE 4X4 16PLY XRAY LF (GAUZE/BANDAGES/DRESSINGS) ×3 IMPLANT
GEL ULTRASOUND 20GR AQUASONIC (MISCELLANEOUS) ×3 IMPLANT
GLOVE BIO SURGEON STRL SZ7 (GLOVE) ×3 IMPLANT
GLOVE BIOGEL PI IND STRL 7.5 (GLOVE) ×1 IMPLANT
GLOVE BIOGEL PI INDICATOR 7.5 (GLOVE) ×2
GOWN STRL REUS W/ TWL LRG LVL3 (GOWN DISPOSABLE) ×2 IMPLANT
GOWN STRL REUS W/TWL LRG LVL3 (GOWN DISPOSABLE) ×6
INTRODUCER COOK 11FR (CATHETERS) IMPLANT
KIT BASIN OR (CUSTOM PROCEDURE TRAY) ×3 IMPLANT
KIT PORT POWER 8FR ISP CVUE (Miscellaneous) ×2 IMPLANT
KIT ROOM TURNOVER OR (KITS) ×3 IMPLANT
NDL HYPO 25GX1X1/2 BEV (NEEDLE) ×1 IMPLANT
NEEDLE HYPO 25GX1X1/2 BEV (NEEDLE) ×3 IMPLANT
NS IRRIG 1000ML POUR BTL (IV SOLUTION) ×3 IMPLANT
PACK SURGICAL SETUP 50X90 (CUSTOM PROCEDURE TRAY) ×3 IMPLANT
PAD ARMBOARD 7.5X6 YLW CONV (MISCELLANEOUS) ×6 IMPLANT
PENCIL BUTTON HOLSTER BLD 10FT (ELECTRODE) ×3 IMPLANT
SET INTRODUCER 12FR PACEMAKER (SHEATH) IMPLANT
SET SHEATH INTRODUCER 10FR (MISCELLANEOUS) IMPLANT
SHEATH COOK PEEL AWAY SET 9F (SHEATH) IMPLANT
SUT MNCRL AB 4-0 PS2 18 (SUTURE) ×7 IMPLANT
SUT PROLENE 2 0 SH DA (SUTURE) ×3 IMPLANT
SUT SILK 2 0 (SUTURE)
SUT SILK 2-0 18XBRD TIE 12 (SUTURE) IMPLANT
SUT VIC AB 3-0 SH 27 (SUTURE) ×6
SUT VIC AB 3-0 SH 27XBRD (SUTURE) ×1 IMPLANT
SYR 20ML ECCENTRIC (SYRINGE) ×6 IMPLANT
SYR 5ML LUER SLIP (SYRINGE) ×3 IMPLANT
SYR BULB 3OZ (MISCELLANEOUS) ×2 IMPLANT
SYR CONTROL 10ML LL (SYRINGE) IMPLANT
TOWEL OR 17X24 6PK STRL BLUE (TOWEL DISPOSABLE) ×3 IMPLANT
TOWEL OR 17X26 10 PK STRL BLUE (TOWEL DISPOSABLE) ×3 IMPLANT

## 2017-01-14 NOTE — Anesthesia Preprocedure Evaluation (Signed)
Anesthesia Evaluation  Patient identified by MRN, date of birth, ID band Patient awake    Reviewed: Allergy & Precautions, NPO status , Patient's Chart, lab work & pertinent test results  Airway Mallampati: II  TM Distance: >3 FB Neck ROM: Full    Dental no notable dental hx.    Pulmonary former smoker,    Pulmonary exam normal breath sounds clear to auscultation       Cardiovascular negative cardio ROS Normal cardiovascular exam Rhythm:Regular Rate:Normal     Neuro/Psych negative neurological ROS  negative psych ROS   GI/Hepatic negative GI ROS, Neg liver ROS,   Endo/Other  negative endocrine ROS  Renal/GU negative Renal ROS  negative genitourinary   Musculoskeletal negative musculoskeletal ROS (+)   Abdominal   Peds negative pediatric ROS (+)  Hematology negative hematology ROS (+)   Anesthesia Other Findings   Reproductive/Obstetrics negative OB ROS                             Anesthesia Physical Anesthesia Plan  ASA: II  Anesthesia Plan: General   Post-op Pain Management:    Induction: Intravenous  PONV Risk Score and Plan: 3 and Ondansetron, Dexamethasone and Midazolam  Airway Management Planned: LMA  Additional Equipment:   Intra-op Plan:   Post-operative Plan: Extubation in OR  Informed Consent: I have reviewed the patients History and Physical, chart, labs and discussed the procedure including the risks, benefits and alternatives for the proposed anesthesia with the patient or authorized representative who has indicated his/her understanding and acceptance.   Dental advisory given  Plan Discussed with: CRNA  Anesthesia Plan Comments:         Anesthesia Quick Evaluation

## 2017-01-14 NOTE — Op Note (Signed)
Preoperative diagnosis: breast cancer need for venous access Postoperative diagnosis: same as above Procedure: right ij US guided powerport insertion Surgeon: Dr Serita Grammes EBL: minimal Anes: general  Specimens none Complications none Drains none Sponge count correct Dispo to pacu stable  Indications: This is a 15 yof with newly diagnosed breast cancer who will begin primary chemotherapy. Discussed port placement.  Procedure: After informed consent was obtained the patient was taken to the operating room. She was given antibiotics. Sequential compression devices were on her legs. She was then placed under general anesthesia with an LMA. Then she was prepped and draped in the standard sterile surgical fashion. Surgical timeout was then performed.  I used the ultrasound to identify the right internal jugular vein. I then accessed the vein using the ultrasound.This aspirated blood. I then placed the wire. This was confirmed by fluoroscopy and ultrasound to be in the correct position. I created a pocket on the right chest. I tunneled the line between the 2 sites. I then dilated the tract and placed the dilator assembly with the sheath. This was done under fluoroscopy. I then removed the sheath and dilator. The wire was also removed. The line was then pulled back to be in the venacava. I hooked this up to the port. I sutured this into place with 2-0 Prolene in 2 places. This aspirated blood and flushed easily.This was confirmed with a final fluoroscopy. I then closed this with 2-0 Vicryl and 4-0 Monocryl. I accessed the port and it flushed easily and withdrew blood.  Dermabond was placed on both the incisions.A dressing was placed. She tolerated this well and was transferred to the recovery room in stable condition

## 2017-01-14 NOTE — Discharge Instructions (Signed)
    PORT-A-CATH: POST OP INSTRUCTIONS  Always review your discharge instruction sheet given to you by the facility where your surgery was performed.   1. A prescription for pain medication may be given to you upon discharge. Take your pain medication as prescribed, if needed. If narcotic pain medicine is not needed, then you make take acetaminophen (Tylenol) or ibuprofen (Advil) as needed.  2. Take your usually prescribed medications unless otherwise directed. 3. If you need a refill on your pain medication, please contact our office. All narcotic pain medicine now requires a paper prescription.  Phoned in and fax refills are no longer allowed by law.  Prescriptions will not be filled after 5 pm or on weekends.  4. You should follow a light diet for the remainder of the day after your procedure. 5. Most patients will experience some mild swelling and/or bruising in the area of the incision. It may take several days to resolve. 6. It is common to experience some constipation if taking pain medication after surgery. Increasing fluid intake and taking a stool softener (such as Colace) will usually help or prevent this problem from occurring. A mild laxative (Milk of Magnesia or Miralax) should be taken according to package directions if there are no bowel movements after 48 hours.  7. Unless discharge instructions indicate otherwise, you may remove your bandages 48 hours after surgery, and you may shower at that time. You may have steri-strips (small white skin tapes) in place directly over the incision.  These strips should be left on the skin for 7-10 days.  If your surgeon used Dermabond (skin glue) on the incision, you may shower in 24 hours.  The glue will flake off over the next 2-3 weeks.  8. If your port is left accessed at the end of surgery (needle left in port), the dressing cannot get wet and should only by changed by a healthcare professional. When the port is no longer accessed (when the  needle has been removed), follow step 7.   9. ACTIVITIES:  Limit activity involving your arms for the next 72 hours. Do no strenuous exercise or activity for 1 week. You may drive when you are no longer taking prescription pain medication, you can comfortably wear a seatbelt, and you can maneuver your car. 10.You may need to see your doctor in the office for a follow-up appointment.  Please       check with your doctor.  11.When you receive a new Port-a-Cath, you will get a product guide and        ID card.  Please keep them in case you need them.  WHEN TO CALL YOUR DOCTOR ([phone]): 1. Fever over 101.0 2. Chills 3. Continued bleeding from incision 4. Increased redness and tenderness at the site 5. Shortness of breath, difficulty breathing   The clinic staff is available to answer your questions during regular business hours. Please don't hesitate to call and ask to speak to one of the nurses or medical assistants for clinical concerns. If you have a medical emergency, go to the nearest emergency room or call 911.  A surgeon from [facility] is always on call at the hospital.     For further information, please visit [email or web address]      

## 2017-01-14 NOTE — Anesthesia Procedure Notes (Signed)
Procedure Name: LMA Insertion Date/Time: 01/14/2017 10:54 AM Performed by: Trixie Deis A Pre-anesthesia Checklist: Patient identified, Emergency Drugs available, Suction available and Patient being monitored Patient Re-evaluated:Patient Re-evaluated prior to induction Oxygen Delivery Method: Circle System Utilized Preoxygenation: Pre-oxygenation with 100% oxygen Induction Type: IV induction Ventilation: Mask ventilation without difficulty LMA: LMA inserted LMA Size: 4.0 Number of attempts: 1 Airway Equipment and Method: Bite block Placement Confirmation: positive ETCO2 Tube secured with: Tape Dental Injury: Teeth and Oropharynx as per pre-operative assessment

## 2017-01-14 NOTE — Interval H&P Note (Signed)
History and Physical Interval Note:  01/14/2017 10:31 AM  Kathryn Stanley  has presented today for surgery, with the diagnosis of BREAST CANCER  The various methods of treatment have been discussed with the patient and family. After consideration of risks, benefits and other options for treatment, the patient has consented to  Procedure(s): INSERTION PORT-A-CATH WITH [place] (N/A) as a surgical intervention .  The patient's history has been reviewed, patient examined, no change in status, stable for surgery.  I have reviewed the patient's chart and labs.  Questions were answered to the patient's satisfaction.     Neftaly Swiss

## 2017-01-14 NOTE — Anesthesia Postprocedure Evaluation (Signed)
Anesthesia Post Note  Patient: Kathryn Stanley  Procedure(s) Performed: Procedure(s) (LRB): INSERTION PORT-A-CATH WITH [place] (N/A)     Patient location during evaluation: PACU Anesthesia Type: General Level of consciousness: awake and alert Pain management: pain level controlled Vital Signs Assessment: post-procedure vital signs reviewed and stable Respiratory status: spontaneous breathing, nonlabored ventilation, respiratory function stable and patient connected to nasal cannula oxygen Cardiovascular status: blood pressure returned to baseline and stable Postop Assessment: no signs of nausea or vomiting Anesthetic complications: no    Last Vitals:  Vitals:   01/14/17 1241 01/14/17 1253  BP:  127/77  Pulse:  72  Resp:    Temp: (!) 36.4 C     Last Pain:  Vitals:   01/14/17 1253  TempSrc:   PainSc: 2                  Montez Hageman

## 2017-01-14 NOTE — Telephone Encounter (Signed)
Attempted to call patient to give Pap smear results. No one answered phone. Unable to leave message.

## 2017-01-14 NOTE — Progress Notes (Signed)
Called pt to introduce myself as her Arboriculturist and spoke to her significant other since she just got home from her port placement and was unavailable to talk.  He informed me they are working w/ [employer] and applied for Medicaid.  I informed him of the [employer] and went over what it covers.  They would like to apply so she will bring her proof of income on 01/16/17.  If she is approved I will give her an expense sheet.

## 2017-01-14 NOTE — H&P (Signed)
41 yof referred by Dr Johny Chess for new left breast cancer. she noted a mass for about 3 months. some pain at site. no real change over that time. she has some bilateral expressed clear dc since her last pregnancy. 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 she lives in [place]. she quite smoking july 9 of this year. she has no fh (dad is adopted though). she underwent evaluation with mm and [place]. she has c density breasts. she was noted to have a 3x2.3x1.6 cm mass at 1:30 in the left breast with a 2.2 cm node on [place]. she also had a 5 mm area of calcs in the central breast. all three have been biopsied. the calcs are benign and concordant. 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 the node is positive. she is here with her husband to discuss options today.   Past Surgical History Tawni Pummel, RN; 01/07/2017 7:38 AM) Breast Biopsy  Left. Cesarean Section - 1  Oral Surgery   Diagnostic Studies History Tawni Pummel, RN; 01/07/2017 7:38 AM) Colonoscopy  never Mammogram  within last year Pap Smear  1-5 years ago  Medication History Tawni Pummel, RN; 01/07/2017 7:38 AM) Medications Reconciled  Social History Tawni Pummel, RN; 01/07/2017 7:38 AM) Caffeine use  Coffee, Tea. No alcohol use  No drug use  Tobacco use  Former smoker.  Family History Tawni Pummel, RN; 01/07/2017 7:38 AM) Cerebrovascular Accident  Father.  Pregnancy / Birth History Tawni Pummel, RN; 01/07/2017 7:38 AM) Age at menarche  31 years. Contraceptive History  Oral contraceptives. Gravida  1 Length (months) of breastfeeding  3-6 Maternal age  63-25 Para  1 Regular periods   Other Problems Tawni Pummel, RN; 01/07/2017 7:38 AM) Bladder Problems  Hemorrhoids    Review of Systems Tawni Pummel RN; 01/07/2017 7:38 AM) General Present- Fatigue. Not Present- Appetite Loss, Chills, Fever, Night Sweats, Weight Gain and Weight Loss. Skin Not  Present- Change in Wart/Mole, Dryness, Hives, Jaundice, New Lesions, Non-Healing Wounds, Rash and Ulcer. HEENT Present- Ringing in the Ears. Not Present- Earache, Hearing Loss, Hoarseness, Nose Bleed, Oral Ulcers, Seasonal Allergies, Sinus Pain, Sore Throat, Visual Disturbances, Wears glasses/contact lenses and Yellow Eyes. Breast Not Present- Breast Mass, Breast Pain, Nipple Discharge and Skin Changes. Cardiovascular Not Present- Chest Pain, Difficulty Breathing Lying Down, Leg Cramps, Palpitations, Rapid Heart Rate, Shortness of Breath and Swelling of Extremities. Gastrointestinal Present- Hemorrhoids and Indigestion. Not Present- Abdominal Pain, Bloating, Bloody Stool, Change in Bowel Habits, Chronic diarrhea, Constipation, Difficulty Swallowing, Excessive gas, Gets full quickly at meals, Nausea, Rectal Pain and Vomiting. Female Genitourinary Not Present- Frequency, Nocturia, Painful Urination, Pelvic Pain and Urgency. Musculoskeletal Not Present- Back Pain, Joint Pain, Joint Stiffness, Muscle Pain, Muscle Weakness and Swelling of Extremities. Neurological Not Present- Decreased Memory, Fainting, Headaches, Numbness, Seizures, Tingling, Tremor, Trouble walking and Weakness. Psychiatric Not Present- Anxiety, Bipolar, Change in Sleep Pattern, Depression, Fearful and Frequent crying. Endocrine Not Present- Cold Intolerance, Excessive Hunger, Hair Changes, Heat Intolerance, Hot flashes and New Diabetes. Hematology Not Present- Blood Thinners, Easy Bruising, Excessive bleeding, Gland problems, HIV and Persistent Infections.   Physical Exam [name] MD; 01/07/2017 3:25 PM) General Mental Status-Alert. Orientation-Oriented X3. Head and Neck Trachea-midline. Thyroid Gland Characteristics - normal size and consistency. Eye Sclera/Conjunctiva - Bilateral-No scleral icterus. Chest and Lung Exam Chest and lung exam reveals -quiet, even and easy respiratory effort with no use of  accessory muscles and on  auscultation, normal breath sounds, no adventitious sounds and normal vocal resonance. Breast Nipples-No Discharge. Breast Lump Left Upper Inner - Size - 3 cm (Height). Shape - Irregular. Margin - Ill Defined. Overlying Skin - Normal. Cardiovascular Cardiovascular examination reveals -normal heart sounds, regular rate and rhythm with no murmurs, normal pedal pulses bilaterally and no digital clubbing, cyanosis, edema, increased warmth or tenderness. Abdomen Note: soft nt/nd no hepatomegaly Neurologic Mental Status-Normal. Motor-Normal. Lymphatic Head & Neck General Head & Neck Lymphatics: Bilateral - Description - Normal. Axillary General Axillary Region: Bilateral - Description - Normal. 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 We discussed all of the different options for treatment for breast cancer including surgery, chemotherapy, radiation therapy, Herceptin, and antiestrogen therapy. She is indicated to receive chemotherapy and I think there is benefit to doing this primarily. will plan for port placement soon to facilitate chemotherapy. we discussed right ij port with risks and recovery. We discussed mri to follow during chemotherapy and to assess response. she will get genetic testing due to age. if this is negative and mr doesnt show anything more extensive we will hopefully be able to consider lumpectomy at time of surgery. we discussed mastectomy vs lumpectomy and pros/cons of both today. she understands with negative genetic testing there is no advantage to mastectomy over lump/xrt. we also discussed positive node. primary chemo reasonable to try to downstage nodes to then attempt at TAD. she understands rationale.  will place port, see at end of chemo with mri and then plan surgery

## 2017-01-14 NOTE — Transfer of Care (Signed)
Immediate Anesthesia Transfer of Care Note  Patient: Kathryn Stanley  Procedure(s) Performed: Procedure(s): INSERTION PORT-A-CATH WITH [place] (N/A)  Patient Location: PACU  Anesthesia Type:General  Level of Consciousness: awake, alert  and oriented  Airway & Oxygen Therapy: Patient Spontanous Breathing and Patient connected to nasal cannula oxygen  Post-op Assessment: Report given to RN, Post -op Vital signs reviewed and stable and Patient moving all extremities  Post vital signs: Reviewed and stable  Last Vitals:  Vitals:   01/14/17 0815  BP: 121/78  Resp: 18  Temp: 36.7 C    Last Pain:  Vitals:   01/14/17 0815  TempSrc: Oral         Complications: No apparent anesthesia complications

## 2017-01-15 ENCOUNTER — Ambulatory Visit (HOSPITAL_COMMUNITY): Payer: No Typology Code available for payment source

## 2017-01-15 ENCOUNTER — Encounter (HOSPITAL_COMMUNITY)
Admission: RE | Admit: 2017-01-15 | Discharge: 2017-01-15 | Disposition: A | Discharge status: Home / Self Care | Payer: Medicaid Other | Source: Ambulatory Visit | Attending: Hematology | Admitting: Hematology

## 2017-01-15 ENCOUNTER — Ambulatory Visit (HOSPITAL_COMMUNITY)
Admission: RE | Admit: 2017-01-15 | Discharge: 2017-01-15 | Disposition: A | Discharge status: Home / Self Care | Payer: Medicaid Other | Source: Ambulatory Visit | Attending: Hematology | Admitting: Hematology

## 2017-01-15 ENCOUNTER — Encounter (HOSPITAL_COMMUNITY): Payer: Self-pay | Admitting: General Surgery

## 2017-01-15 ENCOUNTER — Ambulatory Visit (HOSPITAL_COMMUNITY)
Admit: 2017-01-15 | Discharge: 2017-01-15 | Disposition: A | Discharge status: Home / Self Care | Payer: Medicaid Other | Attending: Hematology | Admitting: Hematology

## 2017-01-15 DIAGNOSIS — R590 Localized enlarged lymph nodes: Secondary | ICD-10-CM | POA: Diagnosis not present

## 2017-01-15 DIAGNOSIS — Z170 Estrogen receptor positive status [ER+]: Principal | ICD-10-CM

## 2017-01-15 DIAGNOSIS — C50412 Malignant neoplasm of upper-outer quadrant of left female breast: Secondary | ICD-10-CM

## 2017-01-15 DIAGNOSIS — K769 Liver disease, unspecified: Secondary | ICD-10-CM | POA: Diagnosis not present

## 2017-01-15 DIAGNOSIS — D7389 Other diseases of spleen: Secondary | ICD-10-CM | POA: Insufficient documentation

## 2017-01-15 MED ORDER — TECHNETIUM TC 99M MEDRONATE IV KIT
21.4000 | PACK | Freq: Once | INTRAVENOUS | Status: AC | PRN
  Administered 2017-01-15: 21.4 via INTRAVENOUS

## 2017-01-15 MED ORDER — IOPAMIDOL (ISOVUE-300) INJECTION 61%
INTRAVENOUS | Status: AC
  Filled 2017-01-15: qty 100

## 2017-01-15 MED ORDER — IOPAMIDOL (ISOVUE-300) INJECTION 61%
100.0000 mL | Freq: Once | INTRAVENOUS | Status: AC | PRN
  Administered 2017-01-15: 100 mL via INTRAVENOUS

## 2017-01-16 ENCOUNTER — Ambulatory Visit (HOSPITAL_BASED_OUTPATIENT_CLINIC_OR_DEPARTMENT_OTHER): Payer: Medicaid Other

## 2017-01-16 ENCOUNTER — Encounter: Payer: Self-pay | Admitting: *Deleted

## 2017-01-16 ENCOUNTER — Ambulatory Visit: Payer: Self-pay

## 2017-01-16 ENCOUNTER — Encounter: Payer: Self-pay | Admitting: Hematology

## 2017-01-16 ENCOUNTER — Ambulatory Visit (HOSPITAL_BASED_OUTPATIENT_CLINIC_OR_DEPARTMENT_OTHER): Payer: Medicaid Other | Admitting: Hematology

## 2017-01-16 ENCOUNTER — Other Ambulatory Visit (HOSPITAL_BASED_OUTPATIENT_CLINIC_OR_DEPARTMENT_OTHER): Payer: Medicaid Other

## 2017-01-16 VITALS — BP 130/74 | HR 74 | Temp 98.2°F | Resp 17 | Ht 64.0 in | Wt 189.4 lb

## 2017-01-16 DIAGNOSIS — Z170 Estrogen receptor positive status [ER+]: Principal | ICD-10-CM

## 2017-01-16 DIAGNOSIS — C50412 Malignant neoplasm of upper-outer quadrant of left female breast: Secondary | ICD-10-CM

## 2017-01-16 DIAGNOSIS — K769 Liver disease, unspecified: Secondary | ICD-10-CM

## 2017-01-16 DIAGNOSIS — C773 Secondary and unspecified malignant neoplasm of axilla and upper limb lymph nodes: Secondary | ICD-10-CM | POA: Diagnosis not present

## 2017-01-16 DIAGNOSIS — Z95828 Presence of other vascular implants and grafts: Secondary | ICD-10-CM

## 2017-01-16 DIAGNOSIS — Z5111 Encounter for antineoplastic chemotherapy: Secondary | ICD-10-CM | POA: Diagnosis present

## 2017-01-16 LAB — COMPREHENSIVE METABOLIC PANEL
ALBUMIN: 3.6 g/dL (ref 3.5–5.0)
ALK PHOS: 60 U/L (ref 40–150)
ALT: 16 U/L (ref 0–55)
AST: 11 U/L (ref 5–34)
Anion Gap: 6 mEq/L (ref 3–11)
BUN: 18.5 mg/dL (ref 7.0–26.0)
CO2: 26 mEq/L (ref 22–29)
Calcium: 8.9 mg/dL (ref 8.4–10.4)
Chloride: 106 mEq/L (ref 98–109)
Creatinine: 0.8 mg/dL (ref 0.6–1.1)
EGFR: >90 mL/min/{1.73_m2} (ref >90)
GLUCOSE: 92 mg/dL (ref 70–140)
POTASSIUM: 3.9 meq/L (ref 3.5–5.1)
SODIUM: 138 meq/L (ref 136–145)
TOTAL PROTEIN: 6.9 g/dL (ref 6.4–8.3)
Total Bilirubin: 0.32 mg/dL (ref 0.20–1.20)

## 2017-01-16 LAB — CBC WITH DIFFERENTIAL/PLATELET
BASO%: 0.2 % (ref 0.0–2.0)
Basophils Absolute: 0 10*3/uL (ref 0.0–0.1)
EOS%: 0.5 % (ref 0.0–7.0)
Eosinophils Absolute: 0.1 10*3/uL (ref 0.0–0.5)
HCT: 40.3 % (ref 34.8–46.6)
HEMOGLOBIN: 13.9 g/dL (ref 11.6–15.9)
LYMPH%: 25.9 % (ref 14.0–49.7)
MCH: 29.9 pg (ref 25.1–34.0)
MCHC: 34.6 g/dL (ref 31.5–36.0)
MCV: 86.4 fL (ref 79.5–101.0)
MONO#: 0.9 10*3/uL (ref 0.1–0.9)
MONO%: 8.9 % (ref 0.0–14.0)
NEUT%: 64.5 % (ref 38.4–76.8)
NEUTROS ABS: 6.5 10*3/uL (ref 1.5–6.5)
Platelets: 256 10*3/uL (ref 145–400)
RBC: 4.66 10*6/uL (ref 3.70–5.45)
RDW: 13.3 % (ref 11.2–14.5)
WBC: 10 10*3/uL (ref 3.9–10.3)
lymph#: 2.6 10*3/uL (ref 0.9–3.3)

## 2017-01-16 MED ORDER — PALONOSETRON HCL INJECTION 0.25 MG/5ML
0.2500 mg | Freq: Once | INTRAVENOUS | Status: AC
  Administered 2017-01-16: 0.25 mg via INTRAVENOUS

## 2017-01-16 MED ORDER — SODIUM CHLORIDE 0.9 % IV SOLN
Freq: Once | INTRAVENOUS | Status: AC
  Administered 2017-01-16: 15:00:00 via INTRAVENOUS

## 2017-01-16 MED ORDER — SODIUM CHLORIDE 0.9% FLUSH
10.0000 mL | INTRAVENOUS | Status: DC | PRN
  Administered 2017-01-16: 10 mL
  Filled 2017-01-16: qty 10

## 2017-01-16 MED ORDER — SODIUM CHLORIDE 0.9 % IV SOLN
Freq: Once | INTRAVENOUS | Status: AC
  Administered 2017-01-16: 15:00:00 via INTRAVENOUS
  Filled 2017-01-16: qty 5

## 2017-01-16 MED ORDER — HEPARIN SOD (PORK) LOCK FLUSH 100 UNIT/ML IV SOLN
500.0000 [IU] | Freq: Once | INTRAVENOUS | Status: AC | PRN
  Administered 2017-01-16: 500 [IU]
  Filled 2017-01-16: qty 5

## 2017-01-16 MED ORDER — PALONOSETRON HCL INJECTION 0.25 MG/5ML
INTRAVENOUS | Status: AC
  Filled 2017-01-16: qty 5

## 2017-01-16 MED ORDER — DOXORUBICIN HCL CHEMO IV INJECTION 2 MG/ML
60.0000 mg/m2 | Freq: Once | INTRAVENOUS | Status: AC
  Administered 2017-01-16: 116 mg via INTRAVENOUS
  Filled 2017-01-16: qty 58

## 2017-01-16 MED ORDER — SODIUM CHLORIDE 0.9 % IV SOLN
600.0000 mg/m2 | Freq: Once | INTRAVENOUS | Status: AC
  Administered 2017-01-16: 1160 mg via INTRAVENOUS
  Filled 2017-01-16: qty 58

## 2017-01-16 MED ORDER — PEGFILGRASTIM 6 MG/0.6ML ~~LOC~~ PSKT
6.0000 mg | PREFILLED_SYRINGE | Freq: Once | SUBCUTANEOUS | Status: AC
  Administered 2017-01-16: 6 mg via SUBCUTANEOUS
  Filled 2017-01-16: qty 0.6

## 2017-01-16 MED ORDER — SODIUM CHLORIDE 0.9% FLUSH
10.0000 mL | INTRAVENOUS | Status: DC | PRN
  Administered 2017-01-16: 10 mL via INTRAVENOUS
  Filled 2017-01-16: qty 10

## 2017-01-16 MED FILL — PROCHLORPERAZINE 10 MG TAB: 10 | 7 days supply | Qty: 30 | Fill #0

## 2017-01-16 MED FILL — ONDANSETRON HCL 8 MG TAB: 8 | 15 days supply | Qty: 30 | Fill #0

## 2017-01-16 NOTE — Patient Instructions (Signed)
[facility] Discharge Instructions for Patients Receiving Chemotherapy  Today you received the following chemotherapy agents Doxorubicin, Cytoxan.    To help prevent nausea and vomiting after your treatment, we encourage you to take your nausea medication as prescribed.     If you develop nausea and vomiting that is not controlled by your nausea medication, call the clinic.   BELOW ARE SYMPTOMS THAT SHOULD BE REPORTED IMMEDIATELY:  *FEVER GREATER THAN 100.5 F  *CHILLS WITH OR WITHOUT FEVER  NAUSEA AND VOMITING THAT IS NOT CONTROLLED WITH YOUR NAUSEA MEDICATION  *UNUSUAL SHORTNESS OF BREATH  *UNUSUAL BRUISING OR BLEEDING  TENDERNESS IN MOUTH AND THROAT WITH OR WITHOUT PRESENCE OF ULCERS  *URINARY PROBLEMS  *BOWEL PROBLEMS  UNUSUAL RASH Items with * indicate a potential emergency and should be followed up as soon as possible.  Feel free to call the clinic you have any questions or concerns. The clinic phone number is [phone].  Please show the [facility] at check-in to the Emergency Department and triage nurse.   Doxorubicin injection What is this medicine? DOXORUBICIN (dox oh ROO bi sin) is a chemotherapy drug. It is used to treat many kinds of cancer like leukemia, lymphoma, neuroblastoma, sarcoma, and Wilms' tumor. It is also used to treat bladder cancer, breast cancer, lung cancer, ovarian cancer, stomach cancer, and thyroid cancer. This medicine may be used for other purposes; ask your health care provider or pharmacist if you have questions. COMMON BRAND NAME(S): Adriamycin, Adriamycin PFS, Adriamycin RDF, Rubex What should I tell my health care provider before I take this medicine? They need to know if you have any of these conditions: -heart disease -history of low blood counts caused by a medicine -liver disease -recent or ongoing radiation therapy -an unusual or allergic reaction to doxorubicin, other chemotherapy agents, other  medicines, foods, dyes, or preservatives -pregnant or trying to get pregnant -breast-feeding How should I use this medicine? This drug is given as an infusion into a vein. It is administered in a hospital or clinic by a specially trained health care professional. If you have pain, swelling, burning or any unusual feeling around the site of your injection, tell your health care professional right away. Talk to your pediatrician regarding the use of this medicine in children. Special care may be needed. Overdosage: If you think you have taken too much of this medicine contact a poison control center or emergency room at once. NOTE: This medicine is only for you. Do not share this medicine with others. What if I miss a dose? It is important not to miss your dose. Call your doctor or health care professional if you are unable to keep an appointment. What may interact with this medicine? This medicine may interact with the following medications: -6-mercaptopurine -paclitaxel -phenytoin -St. John's Wort -trastuzumab -verapamil This list may not describe all possible interactions. Give your health care provider a list of all the medicines, herbs, non-prescription drugs, or dietary supplements you use. Also tell them if you smoke, drink alcohol, or use illegal drugs. Some items may interact with your medicine. What should I watch for while using this medicine? This drug may make you feel generally unwell. This is not uncommon, as chemotherapy can affect healthy cells as well as cancer cells. Report any side effects. Continue your course of treatment even though you feel ill unless your doctor tells you to stop. There is a maximum amount of this medicine you should receive throughout your life. The  amount depends on the medical condition being treated and your overall health. Your doctor will watch how much of this medicine you receive in your lifetime. Tell your doctor if you have taken this medicine  before. You may need blood work done while you are taking this medicine. Your urine may turn red for a few days after your dose. This is not blood. If your urine is dark or brown, call your doctor. In some cases, you may be given additional medicines to help with side effects. Follow all directions for their use. Call your doctor or health care professional for advice if you get a fever, chills or sore throat, or other symptoms of a cold or flu. Do not treat yourself. This drug decreases your body's ability to fight infections. Try to avoid being around people who are sick. This medicine may increase your risk to bruise or bleed. Call your doctor or health care professional if you notice any unusual bleeding. Talk to your doctor about your risk of cancer. You may be more at risk for certain types of cancers if you take this medicine. Do not become pregnant while taking this medicine or for 6 months after stopping it. Women should inform their doctor if they wish to become pregnant or think they might be pregnant. Men should not father a child while taking this medicine and for 6 months after stopping it. There is a potential for serious side effects to an unborn child. Talk to your health care professional or pharmacist for more information. Do not breast-feed an infant while taking this medicine. This medicine has caused ovarian failure in some women and reduced sperm counts in some men This medicine may interfere with the ability to have a child. Talk with your doctor or health care professional if you are concerned about your fertility. What side effects may I notice from receiving this medicine? Side effects that you should report to your doctor or health care professional as soon as possible: -allergic reactions like skin rash, itching or hives, swelling of the face, lips, or tongue -breathing problems -chest pain -fast or irregular heartbeat -low blood counts - this medicine may decrease the  number of white blood cells, red blood cells and platelets. You may be at increased risk for infections and bleeding. -pain, redness, or irritation at site where injected -signs of infection - fever or chills, cough, sore throat, pain or difficulty passing urine -signs of decreased platelets or bleeding - bruising, pinpoint red spots on the skin, black, tarry stools, blood in the urine -swelling of the ankles, feet, hands -tiredness -weakness Side effects that usually do not require medical attention (report to your doctor or health care professional if they continue or are bothersome): -diarrhea -hair loss -mouth sores -nail discoloration or damage -nausea -red colored urine -vomiting This list may not describe all possible side effects. Call your doctor for medical advice about side effects. You may report side effects to FDA at 1-800-FDA-1088. Where should I keep my medicine? This drug is given in a hospital or clinic and will not be stored at home. NOTE: This sheet is a summary. It may not cover all possible information. If you have questions about this medicine, talk to your doctor, pharmacist, or health care provider.  2018 Elsevier/Gold Standard (2015-07-23 11:28:51)   Cyclophosphamide injection - CYTOXAN What is this medicine? CYCLOPHOSPHAMIDE (sye kloe FOSS fa mide) is a chemotherapy drug. It slows the growth of cancer cells. This medicine is used to treat  many types of cancer like lymphoma, myeloma, leukemia, breast cancer, and ovarian cancer, to name a few. This medicine may be used for other purposes; ask your health care provider or pharmacist if you have questions. COMMON BRAND NAME(S): Cytoxan, Neosar What should I tell my health care provider before I take this medicine? They need to know if you have any of these conditions: -blood disorders -history of other chemotherapy -infection -kidney disease -liver disease -recent or ongoing radiation therapy -tumors in the  bone marrow -an unusual or allergic reaction to cyclophosphamide, other chemotherapy, other medicines, foods, dyes, or preservatives -pregnant or trying to get pregnant -breast-feeding How should I use this medicine? This drug is usually given as an injection into a vein or muscle or by infusion into a vein. It is administered in a hospital or clinic by a specially trained health care professional. Talk to your pediatrician regarding the use of this medicine in children. Special care may be needed. Overdosage: If you think you have taken too much of this medicine contact a poison control center or emergency room at once. NOTE: This medicine is only for you. Do not share this medicine with others. What if I miss a dose? It is important not to miss your dose. Call your doctor or health care professional if you are unable to keep an appointment. What may interact with this medicine? This medicine may interact with the following medications: -amiodarone -amphotericin B -azathioprine -certain antiviral medicines for HIV or AIDS such as protease inhibitors (e.g., indinavir, ritonavir) and zidovudine -certain blood pressure medications such as benazepril, captopril, enalapril, fosinopril, lisinopril, moexipril, monopril, perindopril, quinapril, ramipril, trandolapril -certain cancer medications such as anthracyclines (e.g., daunorubicin, doxorubicin), busulfan, cytarabine, paclitaxel, pentostatin, tamoxifen, trastuzumab -certain diuretics such as chlorothiazide, chlorthalidone, hydrochlorothiazide, indapamide, metolazone -certain medicines that treat or prevent blood clots like warfarin -certain muscle relaxants such as succinylcholine -cyclosporine -etanercept -indomethacin -medicines to increase blood counts like filgrastim, pegfilgrastim, sargramostim -medicines used as general anesthesia -metronidazole -natalizumab This list may not describe all possible interactions. Give your health care  provider a list of all the medicines, herbs, non-prescription drugs, or dietary supplements you use. Also tell them if you smoke, drink alcohol, or use illegal drugs. Some items may interact with your medicine. What should I watch for while using this medicine? Visit your doctor for checks on your progress. This drug may make you feel generally unwell. This is not uncommon, as chemotherapy can affect healthy cells as well as cancer cells. Report any side effects. Continue your course of treatment even though you feel ill unless your doctor tells you to stop. Drink water or other fluids as directed. Urinate often, even at night. In some cases, you may be given additional medicines to help with side effects. Follow all directions for their use. Call your doctor or health care professional for advice if you get a fever, chills or sore throat, or other symptoms of a cold or flu. Do not treat yourself. This drug decreases your body's ability to fight infections. Try to avoid being around people who are sick. This medicine may increase your risk to bruise or bleed. Call your doctor or health care professional if you notice any unusual bleeding. Be careful brushing and flossing your teeth or using a toothpick because you may get an infection or bleed more easily. If you have any dental work done, tell your dentist you are receiving this medicine. You may get drowsy or dizzy. Do not drive, use machinery, or  do anything that needs mental alertness until you know how this medicine affects you. Do not become pregnant while taking this medicine or for 1 year after stopping it. Women should inform their doctor if they wish to become pregnant or think they might be pregnant. Men should not father a child while taking this medicine and for 4 months after stopping it. There is a potential for serious side effects to an unborn child. Talk to your health care professional or pharmacist for more information. Do not  breast-feed an infant while taking this medicine. This medicine may interfere with the ability to have a child. This medicine has caused ovarian failure in some women. This medicine has caused reduced sperm counts in some men. You should talk with your doctor or health care professional if you are concerned about your fertility. If you are going to have surgery, tell your doctor or health care professional that you have taken this medicine. What side effects may I notice from receiving this medicine? Side effects that you should report to your doctor or health care professional as soon as possible: -allergic reactions like skin rash, itching or hives, swelling of the face, lips, or tongue -low blood counts - this medicine may decrease the number of white blood cells, red blood cells and platelets. You may be at increased risk for infections and bleeding. -signs of infection - fever or chills, cough, sore throat, pain or difficulty passing urine -signs of decreased platelets or bleeding - bruising, pinpoint red spots on the skin, black, tarry stools, blood in the urine -signs of decreased red blood cells - unusually weak or tired, fainting spells, lightheadedness -breathing problems -dark urine -dizziness -palpitations -swelling of the ankles, feet, hands -trouble passing urine or change in the amount of urine -weight gain -yellowing of the eyes or skin Side effects that usually do not require medical attention (report to your doctor or health care professional if they continue or are bothersome): -changes in nail or skin color -hair loss -missed menstrual periods -mouth sores -nausea, vomiting This list may not describe all possible side effects. Call your doctor for medical advice about side effects. You may report side effects to FDA at 1-800-FDA-1088. Where should I keep my medicine? This drug is given in a hospital or clinic and will not be stored at home. NOTE: This sheet is a  summary. It may not cover all possible information. If you have questions about this medicine, talk to your doctor, pharmacist, or health care provider.  2018 Elsevier/Gold Standard (2012-04-09 16:22:58)

## 2017-01-16 NOTE — Progress Notes (Signed)
[place]  Telephone:([phone]) [phone] Fax:([phone]  Clinic Follow-up Note   Patient Care Team: [name], MD as PCP - General (Internal Medicine) [name], MD as Consulting Physician (General Surgery) [name], MD as Consulting Physician (Hematology) [name], MD as Consulting Physician (Radiation Oncology) 01/16/2017  CHIEF COMPLAINTS/PURPOSE OF CONSULTATION:  Follow-up for Malignant neoplasm of upper-outer quadrant of left breast in female, estrogen receptor positive   Oncology History   Cancer Staging Malignant neoplasm of upper-outer quadrant of left breast in female, estrogen receptor positive ([place]) Staging form: Breast, AJCC 8th Edition - Clinical stage from 12/29/2016: Stage IIIA (cT2, cN1, cM0, G3, ER: Positive, PR: Negative, HER2: Negative) - Signed by [name], MD on 01/05/2017       Malignant neoplasm of upper-outer quadrant of left breast in female, estrogen receptor positive ([place])   12/25/2016 Mammogram    [place] and MM Diagnostic Breast Tomo Bilateral 12/25/16 IMPRESSION: 1. Suspicious mass 2.3 x 1.6 x 3.0 cm  in the 130 o'clock location of the left breast 8cm from the nipple, biopsy recommended. Adjacent simple cyst is 1.9 cm. 2. Suspicious left axillary lymph node 2.2cm for which biopsy is indicated. 3. Indeterminate group of calcifications in the upper central portion of the left breast for which biopsy is recommended.       12/29/2016 Initial Biopsy    Diagnosis 12/29/16 1. Breast, left, needle core biopsy, stereotactic, upper inner quadrant - FIBROCYSTIC CHANGES INCLUDING APOCRINE METAPLASIA WITH CALCIFICATIONS - NO MALIGNANCY IDENTIFIED 2. Breast, left, needle core biopsy, ultrasound, 1:30 o'clock - INVASIVE MAMMARY CARCINOMA, G3 - SEE COMMENT 3. Lymph node, needle/core biopsy, ultrasound, left axillary - METASTIC CARCINOMA INVOLVING ONE LYMPH NODE (1/1)       12/29/2016 Receptors her2    ER 15%+, weak staining  PR  - HER2- Ki67 10%       12/29/2016 Initial Diagnosis    Malignant neoplasm of upper-outer quadrant of left breast in female, estrogen receptor positive ([place])      01/13/2017 Echocardiogram    ECHO 01/13/17 Study Conclusions - Left ventricle: The cavity size was normal. Systolic function was   normal. The estimated ejection fraction was in the range of 60%   to 65%. Wall motion was normal; there were no regional wall   motion abnormalities. Left ventricular diastolic function   parameters were normal. - Mitral valve: There was trivial regurgitation. - Right atrium: The atrium was mildly dilated. - Tricuspid valve: There was trivial regurgitation.       01/14/2017 Surgery    Port placement by Dr. Donne Hazel       01/15/2017 Imaging    Whole Body Scan 01/15/17 IMPRESSION: 1.  No significant abnormality identified.      01/15/2017 Imaging    CT CAP W Contrast 01/15/17 IMPRESSION: 1. Left lateral breast mass with pathologic left axillary adenopathy including a 2.0 cm left axillary lymph node. 2. Several hypodense lesions in the liver are likely benign lesions such as cysts. However, these are technically too small to characterize by CT. Possibilities for further workup include surveillance or hepatic protocol MRI with and without contrast. 3. There is also a tiny hypodense lesion in the spleen which is technically nonspecific but statistically highly likely to be benign. 4.  Prominent stool throughout the colon favors constipation      01/16/2017 -  Chemotherapy    AC every 2 weeks for 4 cycles then proceed with weekly taxol for 12 treatments or biweekly Taxol for 4  treatments        HISTORY OF PRESENTING ILLNESS: 01/07/17 Kathryn Stanley 41 y.o. female is here because of newly diagnosed Malignant neoplasm of upper-outer quadrant of left breast in female, estrogen receptor positive. She presents to the breast clinic today with her common law husband. She felt the lump herself 3  months ago. She felt pain like "stabbing" first. She then had a mammogram 12/25/16 and subsequent biopsy   In the past she was using smoking match and quit smoking "cold [place]" that was 1 month ago.   Today she reports her breast pain still comes and goes. She has not noticed any new changes. 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 Lately has regular period. She has a weak cervix and her children were born premature. She is currently working at [employer] and works on her feet all the time, working 5 days a week.     GYN HISTORY  Menarchal: 10 LMP: 12/10/16 Contraceptive: No HRT: NA GP: G2P1 - twins    CURRENT THERAPY: Neoadjuvant chemo AC every 2 weeks for 4 cycles then proceed with weekly taxol for 12 treatments or biweekly Taxol for 4 treatments, starting on 01/16/2017   INTERVAL HISTORY:  Kathryn Stanley is here for a follow-up and cycle 1 of AC. She presents to the clinic today with her husband.  She came in for the Chemo class and her port placed. It is slightly sore. She reports nothing new since last time.  She used Cream today and her port was accessible.  She has not noticed any change in her breast.    MEDICAL HISTORY:  Past Medical History:  Diagnosis Date  . Cancer [facility])    left breast cancer  . Dermatologic problem    possible psoriasis. Pt has not had been diagnosed by dermatology.  Marland Kitchen GERD (gastroesophageal reflux disease)   . Tobacco dependence    trying to quit with nicotene patches    SURGICAL HISTORY: Past Surgical History:  Procedure Laterality Date  . CESAREAN SECTION    . PORTACATH PLACEMENT N/A 01/14/2017   Procedure: INSERTION PORT-A-CATH WITH [place];  Surgeon: [name], MD;  Location: [place];  Service: General;  Laterality: N/A;    SOCIAL HISTORY: Social History   Social History  . Marital status: Married    Spouse name: N/A  . Number of children: N/A  . Years of  education: N/A   Occupational History  . Not on file.   Social History Main Topics  . Smoking status: Former Smoker    Packs/day: 1.00    Years: 17.00    Types: Cigarettes    Quit date: 12/15/2016  . Smokeless tobacco: Never Used  . Alcohol use No  . Drug use: No  . Sexual activity: Yes    Birth control/ protection: None   Other Topics Concern  . Not on file   Social History Narrative  . No narrative on file    FAMILY HISTORY: Family History  Problem Relation Age of Onset  . Stroke Maternal Grandmother   . Stroke Father   . Hypertension Father     ALLERGIES:  is allergic to amoxicillin and nicotine.  MEDICATIONS:  Current Outpatient Prescriptions  Medication Sig Dispense Refill  . famotidine (PEPCID) 10 MG tablet Take 10 mg by mouth daily as needed for heartburn or indigestion.    Marland Kitchen ibuprofen (ADVIL,MOTRIN) 200 MG tablet Take  200-400 mg by mouth daily as needed for headache or moderate pain.    Marland Kitchen lidocaine-prilocaine (EMLA) cream Apply to affected area once 30 g 3  . ondansetron (ZOFRAN) 8 MG tablet Take 1 tablet (8 mg total) by mouth 2 (two) times daily as needed. Start on the third day after chemotherapy. 30 tablet 1  . prochlorperazine (COMPAZINE) 10 MG tablet Take 1 tablet (10 mg total) by mouth every 6 (six) hours as needed (Nausea or vomiting). 30 tablet 1  . traMADol (ULTRAM) 50 MG tablet Take 1 tablet (50 mg total) by mouth every 6 (six) hours as needed. 12 tablet 1   No current facility-administered medications for this visit.     REVIEW OF SYSTEMS:   Constitutional: Denies fevers, chills or abnormal night sweats Eyes: Denies blurriness of vision, double vision or watery eyes Ears, nose, mouth, throat, and face: Denies mucositis or sore throat Respiratory: Denies cough, dyspnea or wheezes Cardiovascular: Denies palpitation, chest discomfort or lower extremity swelling Gastrointestinal:  Denies nausea, heartburn or change in bowel habits Skin: Denies  abnormal skin rashes Lymphatics: Denies new lymphadenopathy or easy bruising Neurological:Denies numbness, tingling or new weaknesses Breast: (+) occasional shooting pain in left breast, palpable mass in left breast Behavioral/Psych: Mood is stable, no new changes  All other systems were reviewed with the patient and are negative.  PHYSICAL EXAMINATION: ECOG PERFORMANCE STATUS: 0 - Asymptomatic  Vitals:   01/16/17 1345  BP: 130/74  Pulse: 74  Resp: 17  Temp: 98.2 F (36.8 C)  SpO2: 99%   Filed Weights   01/16/17 1345  Weight: 189 lb 6.4 oz (85.9 kg)    GENERAL:alert, no distress and comfortable SKIN: skin color, texture, turgor are normal, no rashes or significant lesions EYES: normal, conjunctiva are pink and non-injected, sclera clear OROPHARYNX:no exudate, no erythema and lips, buccal mucosa, and tongue normal  NECK: supple, thyroid normal size, non-tender, without nodularity LYMPH:  no palpable lymphadenopathy in the cervical, axillary or inguinal LUNGS: clear to auscultation and percussion with normal breathing effort HEART: regular rate & rhythm and no murmurs and no lower extremity edema ABDOMEN:abdomen soft, non-tender and normal bowel sounds Musculoskeletal:no cyanosis of digits and no clubbing  PSYCH: alert & oriented x 3 with fluent speech NEURO: no focal motor/sensory deficits Breast: (+) 2.5 x 3.5 cm mass in UIQ (12:00) of left breast, and  movable 1.5 cm lymph node in left axillary, non-tender  LABORATORY DATA:  I have reviewed the data as listed CBC Latest Ref Rng & Units 01/16/2017 01/14/2017 01/07/2017  WBC 3.9 - 10.3 10e3/uL 10.0 6.7 5.6  Hemoglobin 11.6 - 15.9 g/dL 13.9 13.8 13.6  Hematocrit 34.8 - 46.6 % 40.3 40.0 39.0  Platelets 145 - 400 10e3/uL 256 224 240    CMP Latest Ref Rng & Units 01/16/2017 01/14/2017 01/07/2017  Glucose 70 - 140 mg/dl 92 98 107  BUN 7.0 - 26.0 mg/dL 18.5 14 16.8  Creatinine 0.6 - 1.1 mg/dL 0.8 0.70 0.8  Sodium 136 - 145 mEq/L 138  138 138  Potassium 3.5 - 5.1 mEq/L 3.9 4.1 3.9  Chloride 101 - 111 mmol/L - 108 -  CO2 22 - 29 mEq/L 26 21(L) 23  Calcium 8.4 - 10.4 mg/dL 8.9 8.6(L) 9.1  Total Protein 6.4 - 8.3 g/dL 6.9 - 6.9  Total Bilirubin 0.20 - 1.20 mg/dL 0.32 - 0.50  Alkaline Phos 40 - 150 U/L 60 - 64  AST 5 - 34 U/L 11 - 19  ALT 0 -  55 U/L 16 - 26     PATHOLOGY  Diagnosis 12/29/16 1. Breast, left, needle core biopsy, stereotactic, upper inner quadrant - FIBROCYSTIC CHANGES INCLUDING APOCRINE METAPLASIA WITH CALCIFICATIONS - NO MALIGNANCY IDENTIFIED 2. Breast, left, needle core biopsy, ultrasound, 1:30 o'clock - INVASIVE MAMMARY CARCINOMA - SEE COMMENT 3. Lymph node, needle/core biopsy, ultrasound, left axillary - METASTIC CARCINOMA INVOLVING ONE LYMPH NODE (1/1) Microscopic Comment 2. The biopsy material shows an infiltrative proliferation of cells with large vesicular nuclei with conspicuous nucleoli, arranged linearly and in small clusters. There are scattered cells with marked pleomorphism and cytologic atypia. Based on the biopsy, the carcinoma appears Nottingham grade 3 of 3 and measures 0.9 cm in greatest linear extent. E-cadherin and prognostic markers (ER/PR/ki-67/HER2-FISH)are pending and will be reported in an addendum. Dr. Tresa Moore reviewed the case and agrees with the above diagnosis. This case was called to [facility] on December 30, 2016. Results: IMMUNOHISTOCHEMICAL AND MORPHOMETRIC ANALYSIS PERFORMED MANUALLY Estrogen Receptor: 15%, POSITIVE, WEAK STAINING INTENSITY Progesterone Receptor: 0%, NEGATIVE Proliferation Marker Ki67: 10% COMMENT: The negative hormone receptor study(ies) in this case has An internal positive control. Results: HER2 - NEGATIVE RATIO OF HER2/CEP17 SIGNALS 1.63 AVERAGE HER2 COPY NUMBER PER CELL 3.10   PROCEDURES  ECHO 01/13/17 Study Conclusions - Left ventricle: The cavity size was normal. Systolic function was   normal. The estimated ejection  fraction was in the range of 60%   to 65%. Wall motion was normal; there were no regional wall   motion abnormalities. Left ventricular diastolic function   parameters were normal. - Mitral valve: There was trivial regurgitation. - Right atrium: The atrium was mildly dilated. - Tricuspid valve: There was trivial regurgitation.  RADIOGRAPHIC STUDIES: I have personally reviewed the radiological images as listed and agreed with the findings in the report. Ct Chest W Contrast  Result Date: 01/15/2017 CLINICAL DATA:  New diagnosis of left breast cancer in July 2018, staging workup. EXAM: CT CHEST, ABDOMEN, AND PELVIS WITH CONTRAST TECHNIQUE: Multidetector CT imaging of the chest, abdomen and pelvis was performed following the standard protocol during bolus administration of intravenous contrast. CONTRAST:  163m ISOVUE-300 IOPAMIDOL (ISOVUE-300) INJECTION 61% COMPARISON:  Breast MRI dated 01/13/2017 FINDINGS: CT CHEST FINDINGS Cardiovascular: Right Port-A-Cath noted with tip projecting in the SVC. Adjacent gas in the soft tissues indicates recent placement. Mediastinum/Nodes: Left axillary adenopathy with dominant lymph node 2.0 cm in short axis on image 15/2. Left lateral breast mass approximately 3.1 by 1.7 cm on image 25/2. Lungs/Pleura: Dependent subsegmental atelectasis in the posterior basal segments of both lower lobes. The lungs appear otherwise clear. Musculoskeletal: Mild thoracic spondylosis. CT ABDOMEN PELVIS FINDINGS Hepatobiliary: Hypodense 1.1 cm lesion in segment one of the liver on image 49/2 Similar hypodense 0.9 by 0.8 by 0.7 cm lesion in segment 4 of the liver on image 57/2. Hypodense 0.5 by 0.4 cm lesion more cephalad in segment 4 a of the liver on image 53/2. Gallbladder unremarkable. No significant biliary abnormality is observed. Pancreas: Unremarkable Spleen: Subtle hypodensity in the spleen measuring about 5 mm in diameter on image 54/2, probably benign but may merit surveillance.  Adrenals/Urinary Tract: Unremarkable Stomach/Bowel: Prominent stool throughout the colon favors constipation. Vascular/Lymphatic: Unremarkable Reproductive: Unremarkable Other: No supplemental non-categorized findings. Musculoskeletal: Unremarkable IMPRESSION: 1. Left lateral breast mass with pathologic left axillary adenopathy including a 2.0 cm left axillary lymph node. 2. Several hypodense lesions in the liver are likely benign lesions such as cysts. However, these are technically too small to  characterize by CT. Possibilities for further workup include surveillance or hepatic protocol MRI with and without contrast. 3. There is also a tiny hypodense lesion in the spleen which is technically nonspecific but statistically highly likely to be benign. 4.  Prominent stool throughout the colon favors constipation. Electronically Signed   By: Van Clines M.D.   On: 01/15/2017 09:04   Nm Bone Scan Whole Body  Result Date: 01/15/2017 CLINICAL DATA:  New diagnosis of left-sided breast cancer with axillary lymph adenopathy. EXAM: NUCLEAR MEDICINE WHOLE BODY BONE SCAN TECHNIQUE: Whole body anterior and posterior images were obtained approximately 3 hours after intravenous injection of radiopharmaceutical. RADIOPHARMACEUTICALS:  21.4 mCi Technetium-3mMDP IV COMPARISON:  CT scan dated 01/15/2017 FINDINGS: No significant abnormal focal activity suggestive of metastatic disease to the skeleton. Overall normal distribution and appearance of skeletal activity. IMPRESSION: 1.  No significant abnormality identified. Electronically Signed   By: WVan ClinesM.D.   On: 01/15/2017 12:20   Ct Abdomen Pelvis W Contrast  Result Date: 01/15/2017 CLINICAL DATA:  New diagnosis of left breast cancer in July 2018, staging workup. EXAM: CT CHEST, ABDOMEN, AND PELVIS WITH CONTRAST TECHNIQUE: Multidetector CT imaging of the chest, abdomen and pelvis was performed following the standard protocol during bolus administration of  intravenous contrast. CONTRAST:  1068mISOVUE-300 IOPAMIDOL (ISOVUE-300) INJECTION 61% COMPARISON:  Breast MRI dated 01/13/2017 FINDINGS: CT CHEST FINDINGS Cardiovascular: Right Port-A-Cath noted with tip projecting in the SVC. Adjacent gas in the soft tissues indicates recent placement. Mediastinum/Nodes: Left axillary adenopathy with dominant lymph node 2.0 cm in short axis on image 15/2. Left lateral breast mass approximately 3.1 by 1.7 cm on image 25/2. Lungs/Pleura: Dependent subsegmental atelectasis in the posterior basal segments of both lower lobes. The lungs appear otherwise clear. Musculoskeletal: Mild thoracic spondylosis. CT ABDOMEN PELVIS FINDINGS Hepatobiliary: Hypodense 1.1 cm lesion in segment one of the liver on image 49/2 Similar hypodense 0.9 by 0.8 by 0.7 cm lesion in segment 4 of the liver on image 57/2. Hypodense 0.5 by 0.4 cm lesion more cephalad in segment 4 a of the liver on image 53/2. Gallbladder unremarkable. No significant biliary abnormality is observed. Pancreas: Unremarkable Spleen: Subtle hypodensity in the spleen measuring about 5 mm in diameter on image 54/2, probably benign but may merit surveillance. Adrenals/Urinary Tract: Unremarkable Stomach/Bowel: Prominent stool throughout the colon favors constipation. Vascular/Lymphatic: Unremarkable Reproductive: Unremarkable Other: No supplemental non-categorized findings. Musculoskeletal: Unremarkable IMPRESSION: 1. Left lateral breast mass with pathologic left axillary adenopathy including a 2.0 cm left axillary lymph node. 2. Several hypodense lesions in the liver are likely benign lesions such as cysts. However, these are technically too small to characterize by CT. Possibilities for further workup include surveillance or hepatic protocol MRI with and without contrast. 3. There is also a tiny hypodense lesion in the spleen which is technically nonspecific but statistically highly likely to be benign. 4.  Prominent stool throughout  the colon favors constipation. Electronically Signed   By: WaVan Clines.D.   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 Fibrocystic changes including apocrine metaplasia with calcifications biopsied from the upper inner left breast. LABS:  None obtained at the time of imaging. EXAM: BILATERAL BREAST MRI WITH AND WITHOUT CONTRAST TECHNIQUE: Multiplanar, multisequence MR images of both breasts were obtained prior  to and following the intravenous administration of 17 ml of MultiHance. THREE-DIMENSIONAL MR IMAGE RENDERING ON INDEPENDENT WORKSTATION: Three-dimensional MR images were rendered by post-processing of the original MR data on an independent workstation. The three-dimensional MR images were interpreted, and findings are reported in the following complete MRI report for this study. Three dimensional images were evaluated at the independent DynaCad workstation COMPARISON:  Previous exam(s). FINDINGS: Breast composition: c. Heterogeneous fibroglandular tissue. Background parenchymal enhancement: Moderate. Right breast: No mass or abnormal enhancement. Left breast: In the upper-outer quadrant of the posterior third of the left breast is an irregular enhancing mass measuring 2.5 x 1.3 x 1.8 cm in anterior posterior, transverse and craniocaudal dimensions. Signal void artifact is seen in the mass from the biopsy clip. No additional areas of abnormal enhancement identified the left breast. Lymph nodes: Enlarged level I and level II left axillary adenopathy is visualized. The largest lymph node is a level I lymph node measuring 2.3 cm. Biopsy clip artifact is seen in this lymph node. There is a 1.3 cm enlarged level II lymph node behind the pectoralis muscle. Ancillary findings:  None. IMPRESSION: 2.5 cm  mass in the upper-outer quadrant of the left breast and left axillary adenopathy corresponding with the biopsy proven invasive mammary carcinoma with metastatic axillary disease. RECOMMENDATION: Treatment planning is recommended. BI-RADS CATEGORY  6: Known biopsy-proven malignancy. Electronically Signed   By: Lillia Mountain M.D.   On: 01/14/2017 10:03   Dg Chest Port 1 View  Result Date: 01/14/2017 CLINICAL DATA:  Port-A-Cath placement. EXAM: PORTABLE CHEST 1 VIEW COMPARISON:  None. FINDINGS: Right IJ power port tip is in the mid distal SVC. No complicating features are demonstrated. The cardiac silhouette, mediastinal and hilar contours are normal. The lungs are clear. No pleural effusion. The bony thorax is intact. IMPRESSION: Right IJ power port tip in good position without complicating features. No acute cardiopulmonary findings. Electronically Signed   By: Marijo Sanes M.D.   On: 01/14/2017 12:02   Dg Fluoro Guide Cv Line-no Report  Result Date: 01/14/2017 Fluoroscopy was utilized by the requesting physician.  No radiographic interpretation.   US Breast Ltd Uni Left Inc Axilla  Result Date: 12/25/2016 CLINICAL DATA:  Palpable abnormality in the left breast, first noted 3 months ago. Mass associated with mild tenderness. EXAM: 2D DIGITAL DIAGNOSTIC BILATERAL MAMMOGRAM WITH CAD AND ADJUNCT TOMO ULTRASOUND LEFT BREAST COMPARISON:  Baseline evaluation ACR Breast Density Category c: The breast tissue is heterogeneously dense, which may obscure small masses. FINDINGS: Within the upper-outer quadrant of the left breast, there is a spiculated mass marked as palpable with a BB. Within the upper central portion of the left breast there is a 5 mm group of fine pleomorphic calcifications not associated with mass or distortion. Right breast is negative. Mammographic images were processed with CAD. On physical exam, I palpate a discrete rounded non mobile mass in the upper-outer quadrant of the left breast. Targeted  ultrasound is performed, showing an irregular hypoechoic mass in the 130 o'clock location of the left breast 8 cm from the nipple. Mass is associated with posterior acoustic enhancement and shadowing and measures 2.3 x 1.6 x 3.0 cm. There is internal vascularity on Doppler evaluation. Adjacent simple cyst is 1.9 cm. Evaluation of the left axilla shows a single enlarged lymph node lacking fatty hilum and measuring 2.2 cm and associated trans capsular blood flow on Doppler evaluation. IMPRESSION: 1. Suspicious mass in the 130 o'clock location of the left breast for which biopsy  is indicated. 2. Suspicious left axillary lymph node for which biopsy is indicated. 3. Indeterminate group of calcifications in the upper central portion of the left breast for which biopsy is recommended. RECOMMENDATION: 1. Ultrasound-guided core biopsy of mass in the 130 o'clock location of the left breast. 2. Ultrasound-guided core biopsy of left axillary lymph node. 3. Stereotactic guided core biopsy of left breast calcifications. I have discussed the findings and recommendations with the patient and her husband. Results were also provided in writing at the conclusion of the visit. If applicable, a reminder letter will be sent to the patient regarding the next appointment. BI-RADS CATEGORY  5: Highly suggestive of malignancy. Electronically Signed   By: Nolon Nations M.D.   On: 12/25/2016 10:53   Mm Diag Breast Tomo Bilateral  Result Date: 12/25/2016 CLINICAL DATA:  Palpable abnormality in the left breast, first noted 3 months ago. Mass associated with mild tenderness. EXAM: 2D DIGITAL DIAGNOSTIC BILATERAL MAMMOGRAM WITH CAD AND ADJUNCT TOMO ULTRASOUND LEFT BREAST COMPARISON:  Baseline evaluation ACR Breast Density Category c: The breast tissue is heterogeneously dense, which may obscure small masses. FINDINGS: Within the upper-outer quadrant of the left breast, there is a spiculated mass marked as palpable with a BB. Within the  upper central portion of the left breast there is a 5 mm group of fine pleomorphic calcifications not associated with mass or distortion. Right breast is negative. Mammographic images were processed with CAD. On physical exam, I palpate a discrete rounded non mobile mass in the upper-outer quadrant of the left breast. Targeted ultrasound is performed, showing an irregular hypoechoic mass in the 130 o'clock location of the left breast 8 cm from the nipple. Mass is associated with posterior acoustic enhancement and shadowing and measures 2.3 x 1.6 x 3.0 cm. There is internal vascularity on Doppler evaluation. Adjacent simple cyst is 1.9 cm. Evaluation of the left axilla shows a single enlarged lymph node lacking fatty hilum and measuring 2.2 cm and associated trans capsular blood flow on Doppler evaluation. IMPRESSION: 1. Suspicious mass in the 130 o'clock location of the left breast for which biopsy is indicated. 2. Suspicious left axillary lymph node for which biopsy is indicated. 3. Indeterminate group of calcifications in the upper central portion of the left breast for which biopsy is recommended. RECOMMENDATION: 1. Ultrasound-guided core biopsy of mass in the 130 o'clock location of the left breast. 2. Ultrasound-guided core biopsy of left axillary lymph node. 3. Stereotactic guided core biopsy of left breast calcifications. I have discussed the findings and recommendations with the patient and her husband. Results were also provided in writing at the conclusion of the visit. If applicable, a reminder letter will be sent to the patient regarding the next appointment. BI-RADS CATEGORY  5: Highly suggestive of malignancy. Electronically Signed   By: Nolon Nations M.D.   On: 12/25/2016 10:53   [place] Axillary Node Core Biopsy Left  Addendum Date: 12/30/2016   ADDENDUM REPORT: 12/30/2016 14:54 ADDENDUM: Pathology revealed FIBROCYSTIC CHANGES INCLUDING APOCRINE METAPLASIA WITH CALCIFICATIONS of the Left breast,  upper inner quadrant. This was found to be concordant byDr. Ammie Ferrier. 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 This was found to be concordant by Dr. Ammie Ferrier. Pathology results were discussed with the patient by telephone. The patient reported doing well after the biopsies with tenderness and minimal bleeding at the sites. Post biopsy instructions and care were reviewed and  questions were answered. The patient was encouraged to call [facility] for any additional concerns. The patient was referred to [facility] on January 07, 2017. Pathology results reported by Terie Purser, RN on 12/30/2016. Electronically Signed   By: Ammie Ferrier M.D.   On: 12/30/2016 14:54   Result Date: 12/30/2016 CLINICAL DATA:  41 year old female presenting for ultrasound-guided biopsy of a left breast mass. EXAM: ULTRASOUND GUIDED LEFT BREAST CORE NEEDLE BIOPSY COMPARISON:  Previous exam(s). PROCEDURE: I met with the patient and we discussed the procedure of ultrasound-guided biopsy, including benefits and alternatives. We discussed the high likelihood of a successful procedure. We discussed the risks of the procedure including infection, bleeding, tissue injury, clip migration, and inadequate sampling. Informed written consent was given. The usual time-out protocol was performed immediately prior to the procedure. Lesion quadrant: Upper-outer quadrant Using sterile technique and 1% Lidocaine as local anesthetic, under direct ultrasound visualization, a 12 gauge spring-loaded device was used to perform biopsy of mass in the left breast at 1:30using a superior approach. At the conclusion of the procedure, a ribbon shaped tissue marker clip was deployed into the biopsy cavity. Lesion quadrant: Upper-outer quadrant  Using sterile technique and 1% Lidocaine as local anesthetic, under direct ultrasound visualization, a 14 gauge spring-loaded device was used to perform biopsy of a left axillary lymph nodeusing an inferior approach. At the conclusion of the procedure, a spiral shaped tissue marker clip was deployed into the biopsy cavity. Follow-up 2-view mammogram was performed and dictated separately. IMPRESSION: 1. Ultrasound-guided biopsy of mass in the upper-outer quadrant of the left breast at 1:30. No apparent complications. 2. Ultrasound-guided biopsy of a a left axillary lymph node. No apparent complications. Electronically Signed: By: Ammie Ferrier M.D. On: 12/29/2016 14:34   Mm Clip Placement Left  Result Date: 12/29/2016 CLINICAL DATA:  Post biopsy mammogram of the left breast for clip placement. EXAM: DIAGNOSTIC LEFT MAMMOGRAM POST ULTRASOUND BIOPSY COMPARISON:  Previous exam(s). FINDINGS: Mammographic images were obtained following stereotactic biopsy of left breast calcifications, an ultrasound guided biopsy of left breast mass at 1:30 and a left axillary lymph node. The coil shaped biopsy marking clip is appropriately positioned at the site of biopsied calcifications in the upper left breast. The ribbon shaped biopsy marking clip is positioned within the biopsied mass in the upper-outer left breast. The spiral shaped biopsy marking clip in the lymph node of the left axilla cannot be visualized due to its deep positioning. IMPRESSION: 1. The coil shaped biopsy marking clip is position at the site of biopsied calcifications in the superior left breast. 2. The ribbon shaped biopsy marking clip is appropriately positioned within the mass in the upper-outer left breast. 3. Non visualization of the spiral shaped biopsy marking clip which was placed in the left axillary lymph node. This was well visualized sonographically. Final Assessment: Post Procedure Mammograms for Marker Placement Electronically Signed   By:  Ammie Ferrier M.D.   On: 12/29/2016 14:35   Mm Lt Breast Bx W Loc Dev 1st Lesion Image Bx Spec Stereo Guide  Addendum Date: 12/30/2016   ADDENDUM REPORT: 12/30/2016 14:55 ADDENDUM: Pathology revealed FIBROCYSTIC CHANGES INCLUDING APOCRINE METAPLASIA WITH CALCIFICATIONS of the Left breast, upper inner quadrant. This was found to be concordant byDr. Ammie Ferrier. 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 This was found to be  concordant by Dr. Ammie Ferrier. Pathology results were discussed with the patient by telephone. The patient reported doing well after the biopsies with tenderness and minimal bleeding at the sites. Post biopsy instructions and care were reviewed and questions were answered. The patient was encouraged to call [facility] for any additional concerns. The patient was referred to [facility] on January 07, 2017. Pathology results reported by Terie Purser, RN on 12/30/2016. Electronically Signed   By: Ammie Ferrier M.D.   On: 12/30/2016 14:55   Result Date: 12/30/2016 CLINICAL DATA:  41 year old female presenting for stereotactic biopsy of left breast calcifications. EXAM: LEFT BREAST STEREOTACTIC CORE NEEDLE BIOPSY COMPARISON:  Previous exams. FINDINGS: The patient and I discussed the procedure of stereotactic-guided biopsy including benefits and alternatives. We discussed the high likelihood of a successful procedure. We discussed the risks of the procedure including infection, bleeding, tissue injury, clip migration, and inadequate sampling. Informed written consent was given. The usual time out protocol was performed immediately prior to the procedure. Using sterile technique and 1% Lidocaine as local anesthetic, under stereotactic guidance, a 9 gauge vacuum assisted device was  used to perform core needle biopsy of calcifications in the upper inner quadrant of the left breast using a superior approach. Specimen radiograph was performed showing calcifications within multiple core samples. Specimens with calcifications are identified for pathology. Lesion quadrant: Upper inner quadrant At the conclusion of the procedure, a coil shaped tissue marker clip was deployed into the biopsy cavity. Follow-up 2-view mammogram was performed and dictated separately. IMPRESSION: Stereotactic-guided biopsy of calcifications in the upper inner left breast. No apparent complications. Electronically Signed: By: Ammie Ferrier M.D. On: 12/29/2016 13:23   [place] Lt Breast Bx W Loc Dev 1st Lesion Img Bx Spec US Guide  Addendum Date: 12/30/2016   ADDENDUM REPORT: 12/30/2016 14:54 ADDENDUM: Pathology revealed FIBROCYSTIC CHANGES INCLUDING APOCRINE METAPLASIA WITH CALCIFICATIONS of the Left breast, upper inner quadrant. This was found to be concordant byDr. Ammie Ferrier. 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 This was found to be concordant by Dr. Ammie Ferrier. Pathology results were discussed with the patient by telephone. The patient reported doing well after the biopsies with tenderness and minimal bleeding at the sites. Post biopsy instructions and care were reviewed and questions were answered. The patient was encouraged to call [facility] for any additional concerns. The patient was referred to [facility] on January 07, 2017. Pathology results reported by Terie Purser, RN on 12/30/2016. Electronically Signed   By: Ammie Ferrier M.D.   On: 12/30/2016 14:54   Result Date: 12/30/2016 CLINICAL DATA:  41 year old female presenting for ultrasound-guided biopsy of a left breast mass. EXAM:  ULTRASOUND GUIDED LEFT BREAST CORE NEEDLE BIOPSY COMPARISON:  Previous exam(s). PROCEDURE: I met with the patient and we discussed the procedure of ultrasound-guided biopsy, including benefits and alternatives. We discussed the high likelihood of a successful procedure. We discussed the risks of the procedure including infection, bleeding, tissue injury, clip migration, and inadequate sampling. Informed written consent was given. The usual time-out protocol was performed immediately prior to the procedure. Lesion quadrant: Upper-outer quadrant Using sterile technique and 1% Lidocaine as local anesthetic, under direct ultrasound visualization, a 12 gauge spring-loaded device was used to perform biopsy of  mass in the left breast at 1:30using a superior approach. At the conclusion of the procedure, a ribbon shaped tissue marker clip was deployed into the biopsy cavity. Lesion quadrant: Upper-outer quadrant Using sterile technique and 1% Lidocaine as local anesthetic, under direct ultrasound visualization, a 14 gauge spring-loaded device was used to perform biopsy of a left axillary lymph nodeusing an inferior approach. At the conclusion of the procedure, a spiral shaped tissue marker clip was deployed into the biopsy cavity. Follow-up 2-view mammogram was performed and dictated separately. IMPRESSION: 1. Ultrasound-guided biopsy of mass in the upper-outer quadrant of the left breast at 1:30. No apparent complications. 2. Ultrasound-guided biopsy of a a left axillary lymph node. No apparent complications. Electronically Signed: By: Ammie Ferrier M.D. On: 12/29/2016 14:34    ASSESSMENT & PLAN:  Kathryn Stanley is a 41 y.o. premenopausal female with no significant past medical history, presented with a palpable left breast mass.  1. Malignant neoplasm of upper-outer quadrant of left breast in female, cT2N1M0, Stage IIIa, ER weakly +, PR - , HER2 - , Grade 3 -We reviewed her mammogram and initial biopsy results  in details with patient and her husband.  -Her tumor is weakly ER positive, PR negative, HER-2 negative, grade 3, she has positive lymph nodes, those are all high risks for recurrence. I recommend neoadjuvant or adjuvant chemotherapy to reduce her risk of recurrence after surgery. -We discussed that the benefit of neoadjuvant chemotherapy, to shrink the tumor and lymph nodes, and potentially she would be a candidate for lumpectomy and may not need lymph node dissection if she has good response. I have discussed this with her surgeon Dr. Donne Hazel today, both of [place] recommend her to consider neoadjuvant chemotherapy. She agrees.  -we reviewed her staging scan CT from 01/15/17 in person. A few hypodense lesion were seen in the liver, appear to be benign cysts. I will do a MRI AB to further investigate. CT and bone scan otherwise negative for distant metastasis. -Based cycle was normal. -I recommend any adjuvant chemotherapy with dose dense Adriamycin and Cytoxan (AC) every 2 weeks for 4 cycles, followed by weekly Taxol for 12 weeks. -Chemotherapy consent was obtained. We reviewed potential side effects with her again. She has participated chemotherapy class.  -The goal of therapy is curative. -labs reviewed and she is adequate to start first cycle AC today (01/16/17), with onpro  -f/u in 2 weeks before cycle 2   2. Genetics -Due to her young age of diagnosis I suggest she get genetic testing. She agrees -referred to Genetics  3. Financial Support -They are not insured and are attempting to get an orange card.  -They have met our financial support/social worker    PLAN:  -Labs reviewed and adequate to start first cycle AC today -Lab, flush, f/u and chemo AC in 2, 4, and 6 weeks -abd MRI w wo contrast in 1-2 weeks    All questions were answered. The patient knows to call the clinic with any problems, questions or concerns. I spent 25 minutes counseling the patient face to face. The total time  spent in the appointment was 30 minutes and more than 50% was on counseling.  This document serves as a record of services personally performed by [name], MD. It was created on her behalf by Joslyn Devon, a trained medical scribe. The creation of this record is based on the scribe's personal observations and the provider's statements to them. This document has been checked and approved  by the attending provider.     [name], MD 01/16/2017 11:45 PM

## 2017-01-16 NOTE — Patient Instructions (Signed)

## 2017-01-16 NOTE — Progress Notes (Signed)
Pt is approved for the $1000 [employer] grant.  

## 2017-01-21 ENCOUNTER — Telehealth (HOSPITAL_COMMUNITY): Payer: Self-pay | Admitting: *Deleted

## 2017-01-21 ENCOUNTER — Encounter (HOSPITAL_COMMUNITY): Payer: Self-pay | Admitting: General Surgery

## 2017-01-21 NOTE — Telephone Encounter (Signed)
Called patient and let her know that her Pap smear was normal, HPV negative, and Breast Discharge benign. Let her know that her next Pap smear is due in one year due to per patient her last Pap smear was abnormal. Patient verbalized understanding.

## 2017-01-24 NOTE — Progress Notes (Signed)
[name]  [phone] [phone]  Clinic Follow-up Note   Patient Care Team: [name], MD as PCP - General (Internal Medicine) [name], MD as Consulting Physician (General Surgery) [name], MD as Consulting Physician (Hematology) [name], MD as Consulting Physician (Radiation Oncology) 01/30/2017  CHIEF COMPLAINTS:  Follow-up for Malignant neoplasm of upper-outer quadrant of left breast in female, estrogen receptor positive   Oncology History   Cancer Staging Malignant neoplasm of upper-outer quadrant of left breast in female, estrogen receptor positive ([place]) Staging form: Breast, AJCC 8th Edition - Clinical stage from 12/29/2016: Stage IIIA (cT2, cN1, cM0, G3, ER: Positive, PR: Negative, HER2: Negative) - Signed by [name], MD on 01/05/2017       Malignant neoplasm of upper-outer quadrant of left breast in female, estrogen receptor positive ([place])   12/25/2016 Mammogram    [place] and [facility] Diagnostic Breast Tomo Bilateral 12/25/16 IMPRESSION: 1. Suspicious mass 2.3 x 1.6 x 3.0 cm  in the 130 o'clock location of the left breast 8cm from the nipple, biopsy recommended. Adjacent simple cyst is 1.9 cm. 2. Suspicious left axillary lymph node 2.2cm for which biopsy is indicated. 3. Indeterminate group of calcifications in the upper central portion of the left breast for which biopsy is recommended.       12/29/2016 Initial Biopsy    Diagnosis 12/29/16 1. Breast, left, needle core biopsy, stereotactic, upper inner quadrant - FIBROCYSTIC CHANGES INCLUDING APOCRINE METAPLASIA WITH CALCIFICATIONS - NO MALIGNANCY IDENTIFIED 2. Breast, left, needle core biopsy, ultrasound, 1:30 o'clock - INVASIVE MAMMARY CARCINOMA, G3 - SEE COMMENT 3. Lymph node, needle/core biopsy, ultrasound, left axillary - METASTIC CARCINOMA INVOLVING ONE LYMPH NODE (1/1) E-cadherin is positive supporting a ductal origin.       12/29/2016 Receptors her2    ER  15%+, weak staining  PR - HER2- Ki67 10%       12/29/2016 Initial Diagnosis    Malignant neoplasm of upper-outer quadrant of left breast in female, estrogen receptor positive ([place])      01/13/2017 Echocardiogram    ECHO 01/13/17 Study Conclusions - Left ventricle: The cavity size was normal. Systolic function was   normal. The estimated ejection fraction was in the range of 60%   to 65%. Wall motion was normal; there were no regional wall   motion abnormalities. Left ventricular diastolic function   parameters were normal. - Mitral valve: There was trivial regurgitation. - Right atrium: The atrium was mildly dilated. - Tricuspid valve: There was trivial regurgitation.       01/14/2017 Surgery    Port placement by Dr. Donne Hazel       01/15/2017 Imaging    Whole Body bone Scan 01/15/17 IMPRESSION: 1.  No significant abnormality identified.      01/15/2017 Imaging    CT CAP W Contrast 01/15/17 IMPRESSION: 1. Left lateral breast mass with pathologic left axillary adenopathy including a 2.0 cm left axillary lymph node. 2. Several hypodense lesions in the liver are likely benign lesions such as cysts. However, these are technically too small to characterize by CT. Possibilities for further workup include surveillance or hepatic protocol MRI with and without contrast. 3. There is also a tiny hypodense lesion in the spleen which is technically nonspecific but statistically highly likely to be benign. 4.  Prominent stool throughout the colon favors constipation      01/16/2017 -  Chemotherapy    AC every 2 weeks for 4 cycles then proceed with weekly taxol for 12  treatments or biweekly Taxol for 4 treatments        HISTORY OF PRESENTING ILLNESS: 01/07/17 Kathryn Stanley 41 y.o. female is here because of newly diagnosed Malignant neoplasm of upper-outer quadrant of left breast in female, estrogen receptor positive. She presents to the breast clinic today with her common law husband.  She felt the lump herself 3 months ago. She felt pain like "stabbing" first. She then had a mammogram 12/25/16 and subsequent biopsy   In the past she was using smoking match and quit smoking "cold [place]" that was 1 month ago.   Today she reports her breast pain still comes and goes. She has not noticed any new changes. 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 Lately has regular period. She has a weak cervix and her children were born premature. She is currently working at [employer] and works on her feet all the time, working 5 days a week.   GYN HISTORY  Menarchal: 10 LMP: 12/10/16 Contraceptive: No HRT: NA GP: G2P1 - twins    CURRENT THERAPY: Neoadjuvant chemo AC every 2 weeks for 4 cycles then proceed with weekly taxol for 12 treatments or biweekly Taxol for 4 treatments, starting on 01/16/2017  INTERVAL HISTORY:  Kathryn Stanley is here for a follow-up and cycle 2 of AC. She presents with her mother and husband. Pt reports that her first cycle of chemo went well. She did have some upper back pain, which was worse upon palpation, and this occurred four days after her treatment and it resolved approximately 24 hours following. Her husband also reports that she had a headache towards the end of the cycle which resolved following Advil one day later. She denies any nausea or vomiting. She was taking antiemetics everyday to prevent any nausea throughout her cycle. She did have some mild constipation as well; she took come prunes for this which completely resolved this. She denies any abnormal bleeding. She has good energy levels and appetite levels recently. She has not noticed any significant hair fallout. Her husband reports that the lump in her breast does seem somewhat smaller on his exam. She is requesting a refill for her Compazine.   MEDICAL HISTORY:  Past Medical History:  Diagnosis Date  . Cancer [facility])    left  breast cancer  . Dermatologic problem    possible psoriasis. Pt has not had been diagnosed by dermatology.  Marland Kitchen GERD (gastroesophageal reflux disease)   . Tobacco dependence    trying to quit with nicotene patches    SURGICAL HISTORY: Past Surgical History:  Procedure Laterality Date  . CESAREAN SECTION    . PORTACATH PLACEMENT N/A 01/14/2017   Procedure: INSERTION PORT-A-CATH WITH [place];  Surgeon: [name], MD;  Location: [facility];  Service: General;  Laterality: N/A;    SOCIAL HISTORY: Social History   Social History  . Marital status: Married    Spouse name: N/A  . Number of children: N/A  . Years of education: N/A   Occupational History  . Not on file.   Social History Main Topics  . Smoking status: Former Smoker    Packs/day: 1.00    Years: 17.00    Types: Cigarettes    Quit date: 12/15/2016  . Smokeless tobacco: Never Used  . Alcohol use No  . Drug use: No  . Sexual activity: Yes    Birth control/ protection: None   Other  Topics Concern  . Not on file   Social History Narrative  . No narrative on file    FAMILY HISTORY: Family History  Problem Relation Age of Onset  . Stroke Maternal Grandmother   . Stroke Father   . Hypertension Father     ALLERGIES:  is allergic to amoxicillin and nicotine.  MEDICATIONS:  Current Outpatient Prescriptions  Medication Sig Dispense Refill  . famotidine (PEPCID) 10 MG tablet Take 10 mg by mouth daily as needed for heartburn or indigestion.    Marland Kitchen ibuprofen (ADVIL,MOTRIN) 200 MG tablet Take 200-400 mg by mouth daily as needed for headache or moderate pain.    Marland Kitchen lidocaine-prilocaine (EMLA) cream Apply to affected area once 30 g 3  . ondansetron (ZOFRAN) 8 MG tablet Take 1 tablet (8 mg total) by mouth 2 (two) times daily as needed. Start on the third day after chemotherapy. 30 tablet 1  . prochlorperazine (COMPAZINE) 10 MG tablet TAKE 1 TABLET EVERY 6 HOURS AS NEEDED FOR NAUSEA OR VOMITING 30 tablet 2  . traMADol (ULTRAM)  50 MG tablet Take 1 tablet (50 mg total) by mouth every 6 (six) hours as needed. 12 tablet 1   No current facility-administered medications for this visit.    Facility-Administered Medications Ordered in Other Visits  Medication Dose Route Frequency Provider Last Rate Last Dose  . sodium chloride flush (NS) 0.9 % injection 10 mL  10 mL Intracatheter PRN [name], MD   10 mL at 01/30/17 1645   REVIEW OF SYSTEMS:   Constitutional: Denies fevers, chills or abnormal night sweats.  Eyes: Denies blurriness of vision, double vision or watery eyes Ears, nose, mouth, throat, and face: Denies mucositis or sore throat Respiratory: Denies cough, dyspnea or wheezes Cardiovascular: Denies palpitation, chest discomfort or lower extremity swelling Gastrointestinal:  Denies nausea, heartburn. (+) constipation, resolved Skin: Denies abnormal skin rashes Lymphatics: Denies new lymphadenopathy or easy bruising Neurological:Denies numbness, tingling or new weaknesses Breast: (+) occasional shooting pain in left breast, palpable mass in left breast Musculoskeletal: (+) upper back pain, resolved  Behavioral/Psych: Mood is stable, no new changes  All other systems were reviewed with the patient and are negative.  PHYSICAL EXAMINATION:  ECOG PERFORMANCE STATUS: 0 - Asymptomatic  Vitals:   01/30/17 1311  BP: 135/71  Pulse: 74  Resp: 17  Temp: 98.1 F (36.7 C)  SpO2: 100%   Filed Weights   01/30/17 1311  Weight: 185 lb 1.6 oz (84 kg)    GENERAL:alert, no distress and comfortable SKIN: skin color, texture, turgor are normal, no rashes or significant lesions EYES: normal, conjunctiva are pink and non-injected, sclera clear OROPHARYNX:no exudate, no erythema and lips, buccal mucosa, and tongue normal  NECK: supple, thyroid normal size, non-tender, without nodularity LYMPH:  no palpable lymphadenopathy in the cervical, axillary or inguinal LUNGS: clear to auscultation and percussion with normal  breathing effort HEART: regular rate & rhythm and no murmurs and no lower extremity edema ABDOMEN:abdomen soft, non-tender and normal bowel sounds Musculoskeletal:no cyanosis of digits and no clubbing  PSYCH: alert & oriented x 3 with fluent speech NEURO: no focal motor/sensory deficits Breast: Previously (+) 2.5 x 3.5 cm mass in UIQ (12:00) of left breast, and  movable 1.5 cm lymph node in left axillary, non-tender. Breast exam deferred today   LABORATORY DATA:  I have reviewed the data as listed CBC Latest Ref Rng & Units 01/30/2017 01/16/2017 01/14/2017  WBC 3.9 - 10.3 10e3/uL 13.5(H) 10.0 6.7  Hemoglobin 11.6 -  15.9 g/dL 12.6 13.9 13.8  Hematocrit 34.8 - 46.6 % 36.4 40.3 40.0  Platelets 145 - 400 10e3/uL 164 256 224    CMP Latest Ref Rng & Units 01/30/2017 01/16/2017 01/14/2017  Glucose 70 - 140 mg/dl 116 92 98  BUN 7.0 - 26.0 mg/dL 15.0 18.5 14  Creatinine 0.6 - 1.1 mg/dL 0.8 0.8 0.70  Sodium 136 - 145 mEq/L 138 138 138  Potassium 3.5 - 5.1 mEq/L 3.9 3.9 4.1  Chloride 101 - 111 mmol/L - - 108  CO2 22 - 29 mEq/L 23 26 21(L)  Calcium 8.4 - 10.4 mg/dL 9.1 8.9 8.6(L)  Total Protein 6.4 - 8.3 g/dL 6.9 6.9 -  Total Bilirubin 0.20 - 1.20 mg/dL <0.22 0.32 -  Alkaline Phos 40 - 150 U/L 87 60 -  AST 5 - 34 U/L 13 11 -  ALT 0 - 55 U/L 16 16 -     PATHOLOGY  Diagnosis 12/29/16 1. Breast, left, needle core biopsy, stereotactic, upper inner quadrant - FIBROCYSTIC CHANGES INCLUDING APOCRINE METAPLASIA WITH CALCIFICATIONS - NO MALIGNANCY IDENTIFIED 2. Breast, left, needle core biopsy, ultrasound, 1:30 o'clock - INVASIVE MAMMARY CARCINOMA - SEE COMMENT 3. Lymph node, needle/core biopsy, ultrasound, left axillary - METASTIC CARCINOMA INVOLVING ONE LYMPH NODE (1/1) Microscopic Comment 2. The biopsy material shows an infiltrative proliferation of cells with large vesicular nuclei with conspicuous nucleoli, arranged linearly and in small clusters. There are scattered cells with marked  pleomorphism and cytologic atypia. Based on the biopsy, the carcinoma appears Nottingham grade 3 of 3 and measures 0.9 cm in greatest linear extent. E-cadherin and prognostic markers (ER/PR/ki-67/HER2-FISH)are pending and will be reported in an addendum. Dr. Tresa Moore reviewed the case and agrees with the above diagnosis. This case was called to [facility] on December 30, 2016. Results: IMMUNOHISTOCHEMICAL AND MORPHOMETRIC ANALYSIS PERFORMED MANUALLY Estrogen Receptor: 15%, POSITIVE, WEAK STAINING INTENSITY Progesterone Receptor: 0%, NEGATIVE Proliferation Marker Ki67: 10% COMMENT: The negative hormone receptor study(ies) in this case has An internal positive control. Results: HER2 - NEGATIVE RATIO OF HER2/CEP17 SIGNALS 1.63 AVERAGE HER2 COPY NUMBER PER CELL 3.10   PROCEDURES  ECHO 01/13/17 Study Conclusions - Left ventricle: The cavity size was normal. Systolic function was   normal. The estimated ejection fraction was in the range of 60%   to 65%. Wall motion was normal; there were no regional wall   motion abnormalities. Left ventricular diastolic function   parameters were normal. - Mitral valve: There was trivial regurgitation. - Right atrium: The atrium was mildly dilated. - Tricuspid valve: There was trivial regurgitation.  RADIOGRAPHIC STUDIES: I have personally reviewed the radiological images as listed and agreed with the findings in the report. Ct Chest W Contrast  Result Date: 01/15/2017 CLINICAL DATA:  New diagnosis of left breast cancer in July 2018, staging workup. EXAM: CT CHEST, ABDOMEN, AND PELVIS WITH CONTRAST TECHNIQUE: Multidetector CT imaging of the chest, abdomen and pelvis was performed following the standard protocol during bolus administration of intravenous contrast. CONTRAST:  184m ISOVUE-300 IOPAMIDOL (ISOVUE-300) INJECTION 61% COMPARISON:  Breast MRI dated 01/13/2017 FINDINGS: CT CHEST FINDINGS Cardiovascular: Right Port-A-Cath noted with  tip projecting in the SVC. Adjacent gas in the soft tissues indicates recent placement. Mediastinum/Nodes: Left axillary adenopathy with dominant lymph node 2.0 cm in short axis on image 15/2. Left lateral breast mass approximately 3.1 by 1.7 cm on image 25/2. Lungs/Pleura: Dependent subsegmental atelectasis in the posterior basal segments of both lower lobes. The lungs appear otherwise clear. Musculoskeletal:  Mild thoracic spondylosis. CT ABDOMEN PELVIS FINDINGS Hepatobiliary: Hypodense 1.1 cm lesion in segment one of the liver on image 49/2 Similar hypodense 0.9 by 0.8 by 0.7 cm lesion in segment 4 of the liver on image 57/2. Hypodense 0.5 by 0.4 cm lesion more cephalad in segment 4 a of the liver on image 53/2. Gallbladder unremarkable. No significant biliary abnormality is observed. Pancreas: Unremarkable Spleen: Subtle hypodensity in the spleen measuring about 5 mm in diameter on image 54/2, probably benign but may merit surveillance. Adrenals/Urinary Tract: Unremarkable Stomach/Bowel: Prominent stool throughout the colon favors constipation. Vascular/Lymphatic: Unremarkable Reproductive: Unremarkable Other: No supplemental non-categorized findings. Musculoskeletal: Unremarkable IMPRESSION: 1. Left lateral breast mass with pathologic left axillary adenopathy including a 2.0 cm left axillary lymph node. 2. Several hypodense lesions in the liver are likely benign lesions such as cysts. However, these are technically too small to characterize by CT. Possibilities for further workup include surveillance or hepatic protocol MRI with and without contrast. 3. There is also a tiny hypodense lesion in the spleen which is technically nonspecific but statistically highly likely to be benign. 4.  Prominent stool throughout the colon favors constipation. Electronically Signed   By: Van Clines M.D.   On: 01/15/2017 09:04   Mr Abdomen W Wo Contrast  Result Date: 01/29/2017 CLINICAL DATA:  Newly diagnosed breast  cancer. Indeterminate liver lesions. EXAM: MRI ABDOMEN WITHOUT AND WITH CONTRAST TECHNIQUE: Multiplanar multisequence MR imaging of the abdomen was performed both before and after the administration of intravenous contrast. CONTRAST:  24m EOVIST GADOXETATE DISODIUM 0.25 MOL/L IV SOLN COMPARISON:  01/15/2017 CT. FINDINGS: Minimal motion degradation primarily involving the portal venous phase post-contrast images. Lower chest: Normal heart size without pericardial or pleural effusion. Hepatobiliary: Hepatomegaly at 19.6 cm craniocaudal. Multiple hepatic cysts, including primarily throughout the left liver lobe on the order of 12 mm or less. No suspicious liver lesion. Normal gallbladder, without biliary ductal dilatation. Pancreas:  Normal, without mass or ductal dilatation. Spleen: 6 mm T2 hyperintense splenic lesion is too small to entirely characterize but likely a cyst or hemangioma. Adrenals/Urinary Tract: Normal adrenal glands. Normal kidneys, without hydronephrosis. Stomach/Bowel: Normal stomach and abdominal bowel loops. Vascular/Lymphatic: Normal caliber of the aorta and branch vessels. No retroperitoneal or retrocrural adenopathy. Other:  No ascites. Musculoskeletal: No acute osseous abnormality. IMPRESSION: 1. No evidence of liver or abdominal metastasis. Multiple liver cysts. 2. Hepatomegaly. Electronically Signed   By: KAbigail MiyamotoM.D.   On: 01/29/2017 14:05   Nm Bone Scan Whole Body  Result Date: 01/15/2017 CLINICAL DATA:  New diagnosis of left-sided breast cancer with axillary lymph adenopathy. EXAM: NUCLEAR MEDICINE WHOLE BODY BONE SCAN TECHNIQUE: Whole body anterior and posterior images were obtained approximately 3 hours after intravenous injection of radiopharmaceutical. RADIOPHARMACEUTICALS:  21.4 mCi Technetium-977mDP IV COMPARISON:  CT scan dated 01/15/2017 FINDINGS: No significant abnormal focal activity suggestive of metastatic disease to the skeleton. Overall normal distribution and  appearance of skeletal activity. IMPRESSION: 1.  No significant abnormality identified. Electronically Signed   By: WaVan Clines.D.   On: 01/15/2017 12:20   Ct Abdomen Pelvis W Contrast  Result Date: 01/15/2017 CLINICAL DATA:  New diagnosis of left breast cancer in July 2018, staging workup. EXAM: CT CHEST, ABDOMEN, AND PELVIS WITH CONTRAST TECHNIQUE: Multidetector CT imaging of the chest, abdomen and pelvis was performed following the standard protocol during bolus administration of intravenous contrast. CONTRAST:  10038mSOVUE-300 IOPAMIDOL (ISOVUE-300) INJECTION 61% COMPARISON:  Breast MRI dated 01/13/2017 FINDINGS: CT CHEST  FINDINGS Cardiovascular: Right Port-A-Cath noted with tip projecting in the SVC. Adjacent gas in the soft tissues indicates recent placement. Mediastinum/Nodes: Left axillary adenopathy with dominant lymph node 2.0 cm in short axis on image 15/2. Left lateral breast mass approximately 3.1 by 1.7 cm on image 25/2. Lungs/Pleura: Dependent subsegmental atelectasis in the posterior basal segments of both lower lobes. The lungs appear otherwise clear. Musculoskeletal: Mild thoracic spondylosis. CT ABDOMEN PELVIS FINDINGS Hepatobiliary: Hypodense 1.1 cm lesion in segment one of the liver on image 49/2 Similar hypodense 0.9 by 0.8 by 0.7 cm lesion in segment 4 of the liver on image 57/2. Hypodense 0.5 by 0.4 cm lesion more cephalad in segment 4 a of the liver on image 53/2. Gallbladder unremarkable. No significant biliary abnormality is observed. Pancreas: Unremarkable Spleen: Subtle hypodensity in the spleen measuring about 5 mm in diameter on image 54/2, probably benign but may merit surveillance. Adrenals/Urinary Tract: Unremarkable Stomach/Bowel: Prominent stool throughout the colon favors constipation. Vascular/Lymphatic: Unremarkable Reproductive: Unremarkable Other: No supplemental non-categorized findings. Musculoskeletal: Unremarkable IMPRESSION: 1. Left lateral breast mass with  pathologic left axillary adenopathy including a 2.0 cm left axillary lymph node. 2. Several hypodense lesions in the liver are likely benign lesions such as cysts. However, these are technically too small to characterize by CT. Possibilities for further workup include surveillance or hepatic protocol MRI with and without contrast. 3. There is also a tiny hypodense lesion in the spleen which is technically nonspecific but statistically highly likely to be benign. 4.  Prominent stool throughout the colon favors constipation. Electronically Signed   By: Van Clines M.D.   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 Fibrocystic changes including apocrine metaplasia with calcifications biopsied from the upper inner left breast. LABS:  None obtained at the time of imaging. EXAM: BILATERAL BREAST MRI WITH AND WITHOUT CONTRAST TECHNIQUE: Multiplanar, multisequence MR images of both breasts were obtained prior to and following the intravenous administration of 17 ml of MultiHance. THREE-DIMENSIONAL MR IMAGE RENDERING ON INDEPENDENT WORKSTATION: Three-dimensional MR images were rendered by post-processing of the original MR data on an independent workstation. The three-dimensional MR images were interpreted, and findings are reported in the following complete MRI report for this study. Three dimensional images were evaluated at the independent Dyna[employer] workstation COMPARISON:  Previous exam(s). FINDINGS: Breast composition: c. Heterogeneous fibroglandular tissue. Background parenchymal enhancement: Moderate. Right breast: No mass or abnormal enhancement. Left breast: In the upper-outer quadrant of the posterior third of the left breast is an irregular enhancing mass measuring 2.5 x 1.3 x 1.8 cm in anterior  posterior, transverse and craniocaudal dimensions. Signal void artifact is seen in the mass from the biopsy clip. No additional areas of abnormal enhancement identified the left breast. Lymph nodes: Enlarged level I and level II left axillary adenopathy is visualized. The largest lymph node is a level I lymph node measuring 2.3 cm. Biopsy clip artifact is seen in this lymph node. There is a 1.3 cm enlarged level II lymph node behind the pectoralis muscle. Ancillary findings:  None. IMPRESSION: 2.5 cm mass in the upper-outer quadrant of the left breast and left axillary adenopathy corresponding with the biopsy proven invasive mammary carcinoma with metastatic axillary disease. RECOMMENDATION: Treatment planning is recommended. BI-RADS CATEGORY  6: Known biopsy-proven malignancy. Electronically Signed  By: Lillia Mountain M.D.   On: 01/14/2017 10:03   Dg Chest Port 1 View  Result Date: 01/14/2017 CLINICAL DATA:  Port-A-Cath placement. EXAM: PORTABLE CHEST 1 VIEW COMPARISON:  None. FINDINGS: Right IJ power port tip is in the mid distal SVC. No complicating features are demonstrated. The cardiac silhouette, mediastinal and hilar contours are normal. The lungs are clear. No pleural effusion. The bony thorax is intact. IMPRESSION: Right IJ power port tip in good position without complicating features. No acute cardiopulmonary findings. Electronically Signed   By: Marijo Sanes M.D.   On: 01/14/2017 12:02   Dg Fluoro Guide Cv Line-no Report  Result Date: 01/14/2017 Fluoroscopy was utilized by the requesting physician.  No radiographic interpretation.   ASSESSMENT & PLAN:  Kathryn Stanley is a 41 y.o. premenopausal female with no significant past medical history, presented with a palpable left breast mass.  1. Malignant neoplasm of upper-outer quadrant of left breast in female, ductal carcinoma, cT2N1M0, Stage IIIa, ER weakly +, PR - , HER2 - , Grade 3 -We reviewed her mammogram and initial biopsy results in  details with patient and her husband.  -Her tumor is weakly ER positive, PR negative, HER-2 negative, grade 3, she has positive lymph nodes, those are all high risks for recurrence. I recommend neoadjuvant or adjuvant chemotherapy to reduce her risk of recurrence after surgery. -We discussed that the benefit of neoadjuvant chemotherapy, to shrink the tumor and lymph nodes, and potentially she would be a candidate for lumpectomy and may not need lymph node dissection if she has good response. I have discussed this with her surgeon Dr. Donne Hazel today, both of [place] recommend her to consider neoadjuvant chemotherapy. She agrees.  -we reviewed her staging scan CT from 01/15/17 in person. A few hypodense lesion were seen in the liver, appear to be benign cysts. CT and bone scan otherwise negative for distant metastasis. 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 This was discussed with patient. -I recommend any adjuvant chemotherapy with dose dense Adriamycin and Cytoxan (AC) every 2 weeks for 4 cycles, followed by weekly Taxol for 12 weeks. -Patient tolerated first cycle chemotherapy well -She did have some upper back pain/arthralgias following her first cycle, likely secondary to Neulasta, I advised her that she can take Claritin to help remedy this. I also recommended that she can begin using Colace to remedy any constipation. -Labs otherwise adequate for cycle 2 AC, we'll proceed.   2. Genetics -Due to her young age of diagnosis I suggest she get genetic testing. She agrees -referred to Genetics  3. Financial Support -They are not insured and are attempting to get an orange card.  -They have met our financial support/social worker    PLAN:  -Labs reviewed, Adequate for treatment, we'll proceed to cycle 2 Adriamycin and Cytoxan today, and continue every 2 weeks -Lab, port flush, and f/u in 2 weeks.    All questions were answered. The patient knows to call the clinic with any  problems, questions or concerns. I spent 20 minutes counseling the patient face to face. The total time spent in the appointment was 25 minutes and more than 50% was on counseling.  This document serves as a record of services personally performed by [name], MD. It was created on her behalf by Reola Mosher, a trained medical scribe. The creation of this record is based on the scribe's personal observations and the provider's statements to them. This document has been checked and  approved by the attending provider.    [name], MD 01/30/2017 6:37 PM

## 2017-01-26 ENCOUNTER — Ambulatory Visit (HOSPITAL_COMMUNITY): Admission: RE | Admit: 2017-01-26 | Payer: Medicaid Other | Source: Ambulatory Visit

## 2017-01-28 ENCOUNTER — Other Ambulatory Visit: Payer: Self-pay | Admitting: Hematology

## 2017-01-28 DIAGNOSIS — Z170 Estrogen receptor positive status [ER+]: Principal | ICD-10-CM

## 2017-01-28 DIAGNOSIS — C50412 Malignant neoplasm of upper-outer quadrant of left female breast: Secondary | ICD-10-CM

## 2017-01-28 MED FILL — PROCHLORPERAZINE 10 MG TAB: 10 | 7 days supply | Qty: 30 | Fill #0

## 2017-01-29 ENCOUNTER — Ambulatory Visit (HOSPITAL_COMMUNITY)
Admission: RE | Admit: 2017-01-29 | Discharge: 2017-01-29 | Disposition: A | Discharge status: Home / Self Care | Payer: Medicaid Other | Source: Ambulatory Visit | Attending: Hematology | Admitting: Hematology

## 2017-01-29 DIAGNOSIS — K7689 Other specified diseases of liver: Secondary | ICD-10-CM | POA: Insufficient documentation

## 2017-01-29 DIAGNOSIS — C50412 Malignant neoplasm of upper-outer quadrant of left female breast: Secondary | ICD-10-CM

## 2017-01-29 DIAGNOSIS — K769 Liver disease, unspecified: Secondary | ICD-10-CM | POA: Diagnosis present

## 2017-01-29 DIAGNOSIS — Z170 Estrogen receptor positive status [ER+]: Secondary | ICD-10-CM | POA: Insufficient documentation

## 2017-01-29 DIAGNOSIS — R160 Hepatomegaly, not elsewhere classified: Secondary | ICD-10-CM | POA: Diagnosis not present

## 2017-01-29 MED ORDER — GADOXETATE DISODIUM 0.25 MMOL/ML IV SOLN
10.0000 mL | Freq: Once | INTRAVENOUS | Status: AC | PRN
  Administered 2017-01-29: 10 mL via INTRAVENOUS

## 2017-01-30 ENCOUNTER — Ambulatory Visit (HOSPITAL_BASED_OUTPATIENT_CLINIC_OR_DEPARTMENT_OTHER): Payer: Medicaid Other | Admitting: Hematology

## 2017-01-30 ENCOUNTER — Ambulatory Visit (HOSPITAL_BASED_OUTPATIENT_CLINIC_OR_DEPARTMENT_OTHER): Payer: Medicaid Other

## 2017-01-30 ENCOUNTER — Encounter: Payer: Self-pay | Admitting: *Deleted

## 2017-01-30 ENCOUNTER — Encounter: Payer: Self-pay | Admitting: Hematology

## 2017-01-30 ENCOUNTER — Other Ambulatory Visit (HOSPITAL_BASED_OUTPATIENT_CLINIC_OR_DEPARTMENT_OTHER): Payer: Medicaid Other

## 2017-01-30 ENCOUNTER — Ambulatory Visit: Payer: Medicaid Other

## 2017-01-30 VITALS — BP 135/71 | HR 74 | Temp 98.1°F | Resp 17 | Ht 64.0 in | Wt 185.1 lb

## 2017-01-30 DIAGNOSIS — C773 Secondary and unspecified malignant neoplasm of axilla and upper limb lymph nodes: Secondary | ICD-10-CM

## 2017-01-30 DIAGNOSIS — C50412 Malignant neoplasm of upper-outer quadrant of left female breast: Secondary | ICD-10-CM

## 2017-01-30 DIAGNOSIS — Z170 Estrogen receptor positive status [ER+]: Principal | ICD-10-CM

## 2017-01-30 DIAGNOSIS — Z95828 Presence of other vascular implants and grafts: Secondary | ICD-10-CM

## 2017-01-30 DIAGNOSIS — Z5111 Encounter for antineoplastic chemotherapy: Secondary | ICD-10-CM

## 2017-01-30 LAB — CBC WITH DIFFERENTIAL/PLATELET
BASO%: 0.3 % (ref 0.0–2.0)
Basophils Absolute: 0 10*3/uL (ref 0.0–0.1)
EOS ABS: 0.1 10*3/uL (ref 0.0–0.5)
EOS%: 0.4 % (ref 0.0–7.0)
HCT: 36.4 % (ref 34.8–46.6)
HEMOGLOBIN: 12.6 g/dL (ref 11.6–15.9)
LYMPH%: 15.2 % (ref 14.0–49.7)
MCH: 29.8 pg (ref 25.1–34.0)
MCHC: 34.6 g/dL (ref 31.5–36.0)
MCV: 86.1 fL (ref 79.5–101.0)
MONO#: 1.1 10*3/uL — AB (ref 0.1–0.9)
MONO%: 8.5 % (ref 0.0–14.0)
NEUT%: 75.6 % (ref 38.4–76.8)
NEUTROS ABS: 10.2 10*3/uL — AB (ref 1.5–6.5)
PLATELETS: 164 10*3/uL (ref 145–400)
RBC: 4.23 10*6/uL (ref 3.70–5.45)
RDW: 13 % (ref 11.2–14.5)
WBC: 13.5 10*3/uL — AB (ref 3.9–10.3)
lymph#: 2.1 10*3/uL (ref 0.9–3.3)

## 2017-01-30 LAB — COMPREHENSIVE METABOLIC PANEL
ALBUMIN: 3.6 g/dL (ref 3.5–5.0)
ALK PHOS: 87 U/L (ref 40–150)
ALT: 16 U/L (ref 0–55)
AST: 13 U/L (ref 5–34)
Anion Gap: 8 mEq/L (ref 3–11)
BUN: 15 mg/dL (ref 7.0–26.0)
CALCIUM: 9.1 mg/dL (ref 8.4–10.4)
CO2: 23 mEq/L (ref 22–29)
Chloride: 107 mEq/L (ref 98–109)
Creatinine: 0.8 mg/dL (ref 0.6–1.1)
Glucose: 116 mg/dl (ref 70–140)
Potassium: 3.9 mEq/L (ref 3.5–5.1)
Sodium: 138 mEq/L (ref 136–145)
TOTAL PROTEIN: 6.9 g/dL (ref 6.4–8.3)

## 2017-01-30 MED ORDER — PEGFILGRASTIM 6 MG/0.6ML ~~LOC~~ PSKT
6.0000 mg | PREFILLED_SYRINGE | Freq: Once | SUBCUTANEOUS | Status: AC
  Administered 2017-01-30: 6 mg via SUBCUTANEOUS
  Filled 2017-01-30: qty 0.6

## 2017-01-30 MED ORDER — PALONOSETRON HCL INJECTION 0.25 MG/5ML
INTRAVENOUS | Status: AC
  Filled 2017-01-30: qty 5

## 2017-01-30 MED ORDER — SODIUM CHLORIDE 0.9% FLUSH
10.0000 mL | INTRAVENOUS | Status: DC | PRN
  Administered 2017-01-30: 10 mL
  Filled 2017-01-30: qty 10

## 2017-01-30 MED ORDER — SODIUM CHLORIDE 0.9 % IV SOLN
600.0000 mg/m2 | Freq: Once | INTRAVENOUS | Status: AC
  Administered 2017-01-30: 1160 mg via INTRAVENOUS
  Filled 2017-01-30: qty 58

## 2017-01-30 MED ORDER — SODIUM CHLORIDE 0.9% FLUSH
10.0000 mL | Freq: Once | INTRAVENOUS | Status: AC
  Administered 2017-01-30: 10 mL
  Filled 2017-01-30: qty 10

## 2017-01-30 MED ORDER — SODIUM CHLORIDE 0.9 % IV SOLN
Freq: Once | INTRAVENOUS | Status: AC
  Administered 2017-01-30: 15:00:00 via INTRAVENOUS

## 2017-01-30 MED ORDER — PROCHLORPERAZINE MALEATE 10 MG PO TABS: ORAL_TABLET | ORAL | 2 refills | Status: DC

## 2017-01-30 MED ORDER — SODIUM CHLORIDE 0.9 % IV SOLN
Freq: Once | INTRAVENOUS | Status: AC
  Administered 2017-01-30: 15:00:00 via INTRAVENOUS
  Filled 2017-01-30: qty 5

## 2017-01-30 MED ORDER — DOXORUBICIN HCL CHEMO IV INJECTION 2 MG/ML
60.0000 mg/m2 | Freq: Once | INTRAVENOUS | Status: AC
  Administered 2017-01-30: 116 mg via INTRAVENOUS
  Filled 2017-01-30: qty 58

## 2017-01-30 MED ORDER — HEPARIN SOD (PORK) LOCK FLUSH 100 UNIT/ML IV SOLN
500.0000 [IU] | Freq: Once | INTRAVENOUS | Status: AC | PRN
  Administered 2017-01-30: 500 [IU]
  Filled 2017-01-30: qty 5

## 2017-01-30 MED ORDER — PALONOSETRON HCL INJECTION 0.25 MG/5ML
0.2500 mg | Freq: Once | INTRAVENOUS | Status: AC
  Administered 2017-01-30: 0.25 mg via INTRAVENOUS

## 2017-01-30 NOTE — Patient Instructions (Signed)
[facility] Discharge Instructions for Patients Receiving Chemotherapy  Today you received the following chemotherapy agents Doxorubicin, Cytoxan.   To help prevent nausea and vomiting after your treatment, we encourage you to take your nausea medication as prescribed.    If you develop nausea and vomiting that is not controlled by your nausea medication, call the clinic.   BELOW ARE SYMPTOMS THAT SHOULD BE REPORTED IMMEDIATELY:  *FEVER GREATER THAN 100.5 F  *CHILLS WITH OR WITHOUT FEVER  NAUSEA AND VOMITING THAT IS NOT CONTROLLED WITH YOUR NAUSEA MEDICATION  *UNUSUAL SHORTNESS OF BREATH  *UNUSUAL BRUISING OR BLEEDING  TENDERNESS IN MOUTH AND THROAT WITH OR WITHOUT PRESENCE OF ULCERS  *URINARY PROBLEMS  *BOWEL PROBLEMS  UNUSUAL RASH Items with * indicate a potential emergency and should be followed up as soon as possible.  Feel free to call the clinic you have any questions or concerns. The clinic phone number is [phone].  Please show the [facility] at check-in to the Emergency Department and triage nurse.

## 2017-02-02 ENCOUNTER — Telehealth: Payer: Self-pay | Admitting: Hematology

## 2017-02-02 ENCOUNTER — Ambulatory Visit (HOSPITAL_COMMUNITY): Payer: Medicaid Other

## 2017-02-02 NOTE — Telephone Encounter (Signed)
No 8/24 los

## 2017-02-11 NOTE — Progress Notes (Signed)
[facility]  [phone]) [phone] [phone]  Clinic Follow-up Note   Patient Care Team: [name], MD as PCP - General (Internal Medicine) [name], MD as Consulting Physician (General Surgery) [name], MD as Consulting Physician (Hematology) [name], MD as Consulting Physician (Radiation Oncology) 02/13/2017  CHIEF COMPLAINTS:  Follow-up for Malignant neoplasm of upper-outer quadrant of left breast in female, estrogen receptor positive   Oncology History   Cancer Staging Malignant neoplasm of upper-outer quadrant of left breast in female, estrogen receptor positive ([place]) Staging form: Breast, AJCC 8th Edition - Clinical stage from 12/29/2016: Stage IIIA (cT2, cN1, cM0, G3, ER: Positive, PR: Negative, HER2: Negative) - Signed by [name], MD on 01/05/2017       Malignant neoplasm of upper-outer quadrant of left breast in female, estrogen receptor positive ([place])   12/25/2016 Mammogram    [place] and MM Diagnostic Breast Tomo Bilateral 12/25/16 IMPRESSION: 1. Suspicious mass 2.3 x 1.6 x 3.0 cm  in the 130 o'clock location of the left breast 8cm from the nipple, biopsy recommended. Adjacent simple cyst is 1.9 cm. 2. Suspicious left axillary lymph node 2.2cm for which biopsy is indicated. 3. Indeterminate group of calcifications in the upper central portion of the left breast for which biopsy is recommended.       12/29/2016 Initial Biopsy    Diagnosis 12/29/16 1. Breast, left, needle core biopsy, stereotactic, upper inner quadrant - FIBROCYSTIC CHANGES INCLUDING APOCRINE METAPLASIA WITH CALCIFICATIONS - NO MALIGNANCY IDENTIFIED 2. Breast, left, needle core biopsy, ultrasound, 1:30 o'clock - INVASIVE MAMMARY CARCINOMA, G3 - SEE COMMENT 3. Lymph node, needle/core biopsy, ultrasound, left axillary - METASTIC CARCINOMA INVOLVING ONE LYMPH NODE (1/1) E-cadherin is positive supporting a ductal origin.       12/29/2016 Receptors her2    ER  15%+, weak staining  PR - HER2- Ki67 10%       12/29/2016 Initial Diagnosis    Malignant neoplasm of upper-outer quadrant of left breast in female, estrogen receptor positive ([place])      01/13/2017 Echocardiogram    ECHO 01/13/17 Study Conclusions - Left ventricle: The cavity size was normal. Systolic function was   normal. The estimated ejection fraction was in the range of 60%   to 65%. Wall motion was normal; there were no regional wall   motion abnormalities. Left ventricular diastolic function   parameters were normal. - Mitral valve: There was trivial regurgitation. - Right atrium: The atrium was mildly dilated. - Tricuspid valve: There was trivial regurgitation.       01/14/2017 Surgery    Port placement by Dr. Donne Hazel       01/15/2017 Imaging    Whole Body bone Scan 01/15/17 IMPRESSION: 1.  No significant abnormality identified.      01/15/2017 Imaging    CT CAP W Contrast 01/15/17 IMPRESSION: 1. Left lateral breast mass with pathologic left axillary adenopathy including a 2.0 cm left axillary lymph node. 2. Several hypodense lesions in the liver are likely benign lesions such as cysts. However, these are technically too small to characterize by CT. Possibilities for further workup include surveillance or hepatic protocol MRI with and without contrast. 3. There is also a tiny hypodense lesion in the spleen which is technically nonspecific but statistically highly likely to be benign. 4.  Prominent stool throughout the colon favors constipation      01/16/2017 -  Chemotherapy    AC every 2 weeks for 4 cycles then proceed with weekly taxol for 12  treatments or biweekly Taxol for 4 treatments        HISTORY OF PRESENTING ILLNESS: 01/07/17 Kathryn Stanley Ton 41 y.o. female is here because of newly diagnosed Malignant neoplasm of upper-outer quadrant of left breast in female, estrogen receptor positive. She presents to the breast clinic today with her common law husband.  She felt the lump herself 3 months ago. She felt pain like "stabbing" first. She then had a mammogram 12/25/16 and subsequent biopsy   In the past she was using smoking match and quit smoking "cold [place]" that was 1 month ago.   Today she reports her breast pain still comes and goes. She has not noticed any new changes. 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 Lately has regular period. She has a weak cervix and her children were born premature. She is currently working at [employer] and works on her feet all the time, working 5 days a week.   GYN HISTORY  Menarchal: 10 LMP: 12/10/16 Contraceptive: No HRT: NA GP: G2P1 - twins    CURRENT THERAPY: Neoadjuvant chemo AC every 2 weeks for 4 cycles then proceed with weekly taxol for 12 treatments or biweekly Taxol for 4 treatments, starting on 01/16/2017  INTERVAL HISTORY:  Kathryn Stanley is here for a follow-up and cycle 3 of AC. She presents with her mother and sister.  She returned with her hair shaved due to it falling out. She reports her last treatment went well and she was slightly fatigued. She has been abel to work and go abut her normal routine. She denies fever nausea. She did have some pain from neulasta shot. The pain lasted a day and she used tylenol and  Claritin.     MEDICAL HISTORY:  Past Medical History:  Diagnosis Date  . Cancer [facility])    left breast cancer  . Dermatologic problem    possible psoriasis. Pt has not had been diagnosed by dermatology.  Marland Kitchen GERD (gastroesophageal reflux disease)   . Tobacco dependence    trying to quit with nicotene patches    SURGICAL HISTORY: Past Surgical History:  Procedure Laterality Date  . CESAREAN SECTION    . PORTACATH PLACEMENT N/A 01/14/2017   Procedure: INSERTION PORT-A-CATH WITH [place];  Surgeon: [name], MD;  Location: [facility];  Service: General;  Laterality: N/A;    SOCIAL HISTORY: Social  History   Social History  . Marital status: Married    Spouse name: N/A  . Number of children: N/A  . Years of education: N/A   Occupational History  . Not on file.   Social History Main Topics  . Smoking status: Former Smoker    Packs/day: 1.00    Years: 17.00    Types: Cigarettes    Quit date: 12/15/2016  . Smokeless tobacco: Never Used  . Alcohol use No  . Drug use: No  . Sexual activity: Yes    Birth control/ protection: None   Other Topics Concern  . Not on file   Social History Narrative  . No narrative on file    FAMILY HISTORY: Family History  Problem Relation Age of Onset  . Stroke Maternal Grandmother   . Stroke Father   . Hypertension Father     ALLERGIES:  is allergic to amoxicillin and nicotine.  MEDICATIONS:  Current Outpatient Prescriptions  Medication Sig Dispense Refill  . famotidine (PEPCID) 10 MG tablet Take 10 mg  by mouth daily as needed for heartburn or indigestion.    Marland Kitchen ibuprofen (ADVIL,MOTRIN) 200 MG tablet Take 200-400 mg by mouth daily as needed for headache or moderate pain.    Marland Kitchen lidocaine-prilocaine (EMLA) cream Apply to affected area once 30 g 3  . ondansetron (ZOFRAN) 8 MG tablet Take 1 tablet (8 mg total) by mouth 2 (two) times daily as needed. Start on the third day after chemotherapy. 30 tablet 1  . prochlorperazine (COMPAZINE) 10 MG tablet TAKE 1 TABLET EVERY 6 HOURS AS NEEDED FOR NAUSEA OR VOMITING 30 tablet 2  . traMADol (ULTRAM) 50 MG tablet Take 1 tablet (50 mg total) by mouth every 6 (six) hours as needed. 12 tablet 1   No current facility-administered medications for this visit.    REVIEW OF SYSTEMS:   Constitutional: Denies fevers, chills or abnormal night sweats. (+) fatigue (+) hair loss Eyes: Denies blurriness of vision, double vision or watery eyes Ears, nose, mouth, throat, and face: Denies mucositis or sore throat Respiratory: Denies cough, dyspnea or wheezes Cardiovascular: Denies palpitation, chest discomfort or  lower extremity swelling Gastrointestinal:  Denies nausea, heartburn.  Skin: Denies abnormal skin rashes Lymphatics: Denies new lymphadenopathy or easy bruising Neurological:Denies numbness, tingling or new weaknesses MSK: (+) moderate bone pain  Breast: (+) occasional shooting pain in left breast, palpable mass in left breast Behavioral/Psych: Mood is stable, no new changes  All other systems were reviewed with the patient and are negative.  PHYSICAL EXAMINATION:  ECOG PERFORMANCE STATUS: 0 - Asymptomatic  Vitals:   02/13/17 1039  BP: 133/78  Pulse: 78  Resp: 20  Temp: 98.4 F (36.9 C)  SpO2: 100%   Filed Weights   02/13/17 1039  Weight: 185 lb 14.4 oz (84.3 kg)    GENERAL:alert, no distress and comfortable SKIN: skin color, texture, turgor are normal, no rashes or significant lesions EYES: normal, conjunctiva are pink and non-injected, sclera clear OROPHARYNX:no exudate, no erythema and lips, buccal mucosa, and tongue normal  NECK: supple, thyroid normal size, non-tender, without nodularity LYMPH:  no palpable lymphadenopathy in the cervical, axillary or inguinal LUNGS: clear to auscultation and percussion with normal breathing effort HEART: regular rate & rhythm and no murmurs and no lower extremity edema ABDOMEN:abdomen soft, non-tender and normal bowel sounds Musculoskeletal:no cyanosis of digits and no clubbing  PSYCH: alert & oriented x 3 with fluent speech NEURO: no focal motor/sensory deficits Breast: (+) 1.5 x 1 cm mass in UOQ of left breast and movable lymph node in left axilary that is about 1 cm, both has decreased in size.  LABORATORY DATA:  I have reviewed the data as listed CBC Latest Ref Rng & Units 02/13/2017 01/30/2017 01/16/2017  WBC 3.9 - 10.3 10e3/uL 19.7(H) 13.5(H) 10.0  Hemoglobin 11.6 - 15.9 g/dL 12.3 12.6 13.9  Hematocrit 34.8 - 46.6 % 36.0 36.4 40.3  Platelets 145 - 400 10e3/uL 146 164 256    CMP Latest Ref Rng & Units 02/13/2017 01/30/2017  01/16/2017  Glucose 70 - 140 mg/dl 110 116 92  BUN 7.0 - 26.0 mg/dL 18.6 15.0 18.5  Creatinine 0.6 - 1.1 mg/dL 0.8 0.8 0.8  Sodium 136 - 145 mEq/L 140 138 138  Potassium 3.5 - 5.1 mEq/L 3.8 3.9 3.9  Chloride 101 - 111 mmol/L - - -  CO2 22 - 29 mEq/L _0 Calcium 8.4 - 10.4 mg/dL 9.2 9.1 8.9  Total Protein 6.4 - 8.3 g/dL 6.9 6.9 6.9  Total Bilirubin 0.20 - 1.20  mg/dL <0.22 <0.22 0.32  Alkaline Phos 40 - 150 U/L 102 87 60  AST 5 - 34 U/L _0 ALT 0 - 55 U/L _1 PATHOLOGY  Diagnosis 12/29/16 1. Breast, left, needle core biopsy, stereotactic, upper inner quadrant - FIBROCYSTIC CHANGES INCLUDING APOCRINE METAPLASIA WITH CALCIFICATIONS - NO MALIGNANCY IDENTIFIED 2. Breast, left, needle core biopsy, ultrasound, 1:30 o'clock - INVASIVE MAMMARY CARCINOMA - SEE COMMENT 3. Lymph node, needle/core biopsy, ultrasound, left axillary - METASTIC CARCINOMA INVOLVING ONE LYMPH NODE (1/1) Microscopic Comment 2. The biopsy material shows an infiltrative proliferation of cells with large vesicular nuclei with conspicuous nucleoli, arranged linearly and in small clusters. There are scattered cells with marked pleomorphism and cytologic atypia. Based on the biopsy, the carcinoma appears Nottingham grade 3 of 3 and measures 0.9 cm in greatest linear extent. E-cadherin and prognostic markers (ER/PR/ki-67/HER2-FISH)are pending and will be reported in an addendum. Dr. Tresa Moore reviewed the case and agrees with the above diagnosis. This case was called to [facility] on December 30, 2016. Results: IMMUNOHISTOCHEMICAL AND MORPHOMETRIC ANALYSIS PERFORMED MANUALLY Estrogen Receptor: 15%, POSITIVE, WEAK STAINING INTENSITY Progesterone Receptor: 0%, NEGATIVE Proliferation Marker Ki67: 10% COMMENT: The negative hormone receptor study(ies) in this case has An internal positive control. Results: HER2 - NEGATIVE RATIO OF HER2/CEP17 SIGNALS 1.63 AVERAGE HER2 COPY NUMBER PER CELL  3.10   PROCEDURES  ECHO 01/13/17 Study Conclusions - Left ventricle: The cavity size was normal. Systolic function was   normal. The estimated ejection fraction was in the range of 60%   to 65%. Wall motion was normal; there were no regional wall   motion abnormalities. Left ventricular diastolic function   parameters were normal. - Mitral valve: There was trivial regurgitation. - Right atrium: The atrium was mildly dilated. - Tricuspid valve: There was trivial regurgitation.  RADIOGRAPHIC STUDIES: I have personally reviewed the radiological images as listed and agreed with the findings in the report. Ct Chest W Contrast  Result Date: 01/15/2017 CLINICAL DATA:  New diagnosis of left breast cancer in July 2018, staging workup. EXAM: CT CHEST, ABDOMEN, AND PELVIS WITH CONTRAST TECHNIQUE: Multidetector CT imaging of the chest, abdomen and pelvis was performed following the standard protocol during bolus administration of intravenous contrast. CONTRAST:  140m ISOVUE-300 IOPAMIDOL (ISOVUE-300) INJECTION 61% COMPARISON:  Breast MRI dated 01/13/2017 FINDINGS: CT CHEST FINDINGS Cardiovascular: Right Port-A-Cath noted with tip projecting in the SVC. Adjacent gas in the soft tissues indicates recent placement. Mediastinum/Nodes: Left axillary adenopathy with dominant lymph node 2.0 cm in short axis on image 15/2. Left lateral breast mass approximately 3.1 by 1.7 cm on image 25/2. Lungs/Pleura: Dependent subsegmental atelectasis in the posterior basal segments of both lower lobes. The lungs appear otherwise clear. Musculoskeletal: Mild thoracic spondylosis. CT ABDOMEN PELVIS FINDINGS Hepatobiliary: Hypodense 1.1 cm lesion in segment one of the liver on image 49/2 Similar hypodense 0.9 by 0.8 by 0.7 cm lesion in segment 4 of the liver on image 57/2. Hypodense 0.5 by 0.4 cm lesion more cephalad in segment 4 a of the liver on image 53/2. Gallbladder unremarkable. No significant biliary abnormality is observed.  Pancreas: Unremarkable Spleen: Subtle hypodensity in the spleen measuring about 5 mm in diameter on image 54/2, probably benign but may merit surveillance. Adrenals/Urinary Tract: Unremarkable Stomach/Bowel: Prominent stool throughout the colon favors constipation. Vascular/Lymphatic: Unremarkable Reproductive: Unremarkable Other: No supplemental non-categorized findings. Musculoskeletal: Unremarkable IMPRESSION: 1. Left lateral breast mass with pathologic left axillary adenopathy including a  2.0 cm left axillary lymph node. 2. Several hypodense lesions in the liver are likely benign lesions such as cysts. However, these are technically too small to characterize by CT. Possibilities for further workup include surveillance or hepatic protocol MRI with and without contrast. 3. There is also a tiny hypodense lesion in the spleen which is technically nonspecific but statistically highly likely to be benign. 4.  Prominent stool throughout the colon favors constipation. Electronically Signed   By: Van Clines M.D.   On: 01/15/2017 09:04   Mr Abdomen W Wo Contrast  Result Date: 01/29/2017 CLINICAL DATA:  Newly diagnosed breast cancer. Indeterminate liver lesions. EXAM: MRI ABDOMEN WITHOUT AND WITH CONTRAST TECHNIQUE: Multiplanar multisequence MR imaging of the abdomen was performed both before and after the administration of intravenous contrast. CONTRAST:  69m EOVIST GADOXETATE DISODIUM 0.25 MOL/L IV SOLN COMPARISON:  01/15/2017 CT. FINDINGS: Minimal motion degradation primarily involving the portal venous phase post-contrast images. Lower chest: Normal heart size without pericardial or pleural effusion. Hepatobiliary: Hepatomegaly at 19.6 cm craniocaudal. Multiple hepatic cysts, including primarily throughout the left liver lobe on the order of 12 mm or less. No suspicious liver lesion. Normal gallbladder, without biliary ductal dilatation. Pancreas:  Normal, without mass or ductal dilatation. Spleen: 6 mm T2  hyperintense splenic lesion is too small to entirely characterize but likely a cyst or hemangioma. Adrenals/Urinary Tract: Normal adrenal glands. Normal kidneys, without hydronephrosis. Stomach/Bowel: Normal stomach and abdominal bowel loops. Vascular/Lymphatic: Normal caliber of the aorta and branch vessels. No retroperitoneal or retrocrural adenopathy. Other:  No ascites. Musculoskeletal: No acute osseous abnormality. IMPRESSION: 1. No evidence of liver or abdominal metastasis. Multiple liver cysts. 2. Hepatomegaly. Electronically Signed   By: KAbigail MiyamotoM.D.   On: 01/29/2017 14:05   Nm Bone Scan Whole Body  Result Date: 01/15/2017 CLINICAL DATA:  New diagnosis of left-sided breast cancer with axillary lymph adenopathy. EXAM: NUCLEAR MEDICINE WHOLE BODY BONE SCAN TECHNIQUE: Whole body anterior and posterior images were obtained approximately 3 hours after intravenous injection of radiopharmaceutical. RADIOPHARMACEUTICALS:  21.4 mCi Technetium-989mDP IV COMPARISON:  CT scan dated 01/15/2017 FINDINGS: No significant abnormal focal activity suggestive of metastatic disease to the skeleton. Overall normal distribution and appearance of skeletal activity. IMPRESSION: 1.  No significant abnormality identified. Electronically Signed   By: WaVan Clines.D.   On: 01/15/2017 12:20   Ct Abdomen Pelvis W Contrast  Result Date: 01/15/2017 CLINICAL DATA:  New diagnosis of left breast cancer in July 2018, staging workup. EXAM: CT CHEST, ABDOMEN, AND PELVIS WITH CONTRAST TECHNIQUE: Multidetector CT imaging of the chest, abdomen and pelvis was performed following the standard protocol during bolus administration of intravenous contrast. CONTRAST:  10073mSOVUE-300 IOPAMIDOL (ISOVUE-300) INJECTION 61% COMPARISON:  Breast MRI dated 01/13/2017 FINDINGS: CT CHEST FINDINGS Cardiovascular: Right Port-A-Cath noted with tip projecting in the SVC. Adjacent gas in the soft tissues indicates recent placement.  Mediastinum/Nodes: Left axillary adenopathy with dominant lymph node 2.0 cm in short axis on image 15/2. Left lateral breast mass approximately 3.1 by 1.7 cm on image 25/2. Lungs/Pleura: Dependent subsegmental atelectasis in the posterior basal segments of both lower lobes. The lungs appear otherwise clear. Musculoskeletal: Mild thoracic spondylosis. CT ABDOMEN PELVIS FINDINGS Hepatobiliary: Hypodense 1.1 cm lesion in segment one of the liver on image 49/2 Similar hypodense 0.9 by 0.8 by 0.7 cm lesion in segment 4 of the liver on image 57/2. Hypodense 0.5 by 0.4 cm lesion more cephalad in segment 4 a of the liver on image  53/2. Gallbladder unremarkable. No significant biliary abnormality is observed. Pancreas: Unremarkable Spleen: Subtle hypodensity in the spleen measuring about 5 mm in diameter on image 54/2, probably benign but may merit surveillance. Adrenals/Urinary Tract: Unremarkable Stomach/Bowel: Prominent stool throughout the colon favors constipation. Vascular/Lymphatic: Unremarkable Reproductive: Unremarkable Other: No supplemental non-categorized findings. Musculoskeletal: Unremarkable IMPRESSION: 1. Left lateral breast mass with pathologic left axillary adenopathy including a 2.0 cm left axillary lymph node. 2. Several hypodense lesions in the liver are likely benign lesions such as cysts. However, these are technically too small to characterize by CT. Possibilities for further workup include surveillance or hepatic protocol MRI with and without contrast. 3. There is also a tiny hypodense lesion in the spleen which is technically nonspecific but statistically highly likely to be benign. 4.  Prominent stool throughout the colon favors constipation. Electronically Signed   By: Van Clines M.D.   On: 01/15/2017 09:04   Dg Chest Port 1 View  Result Date: 01/14/2017 CLINICAL DATA:  Port-A-Cath placement. EXAM: PORTABLE CHEST 1 VIEW COMPARISON:  None. FINDINGS: Right IJ power port tip is in the mid  distal SVC. No complicating features are demonstrated. The cardiac silhouette, mediastinal and hilar contours are normal. The lungs are clear. No pleural effusion. The bony thorax is intact. IMPRESSION: Right IJ power port tip in good position without complicating features. No acute cardiopulmonary findings. Electronically Signed   By: Marijo Sanes M.D.   On: 01/14/2017 12:02   Dg Fluoro Guide Cv Line-no Report  Result Date: 01/14/2017 Fluoroscopy was utilized by the requesting physician.  No radiographic interpretation.   ASSESSMENT & PLAN:  Kathryn Stanley is a 41 y.o. premenopausal female with no significant past medical history, presented with a palpable left breast mass.  1. Malignant neoplasm of upper-outer quadrant of left breast in female, ductal carcinoma, cT2N1M0, Stage IIIa, ER weakly +, PR - , HER2 - , Grade 3 -We reviewed her mammogram and initial biopsy results in details with patient and her husband.  -Her tumor is weakly ER positive, PR negative, HER-2 negative, grade 3, she has positive lymph nodes, those are all high risks for recurrence. I recommend neoadjuvant or adjuvant chemotherapy to reduce her risk of recurrence after surgery. -We discussed that the benefit of neoadjuvant chemotherapy, to shrink the tumor and lymph nodes, and potentially she would be a candidate for lumpectomy and may not need lymph node dissection if she has good response. I have discussed this with her surgeon Dr. Donne Hazel today, both of [place] recommend her to consider neoadjuvant chemotherapy. She agrees.  -we reviewed her staging scan CT from 01/15/17 in person. A few hypodense lesion were seen in the liver, appear to be benign cysts. CT and bone scan otherwise negative for distant metastasis. 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 This was discussed with patient. -I recommended neoadjuvant chemotherapy with dose dense Adriamycin and Cytoxan (AC) every 2 weeks for 4 cycles, followed by  weekly Taxol for 12 weeks. She agreed. -She did have some upper back pain/arthralgias following her first cycle, likely secondary to Neulasta, I previously advised her that she can take Claritin to help remedy this. I also recommended that she can begin using Colace to remedy any constipation. -she has been tolerating chemotherapy very wellLabs reviewed and her CMP is unremarkable and her ANC and WBC is elevated but still adequate to treat.  -Will proceed with cycle 3 AC today -F/u in 2 weeks   2. Genetics -Due to her young  age of diagnosis I suggest she get genetic testing. She agrees -referred to Genetics  3. Financial Support -They are not insured and are attempting to get an orange card.  -They have met our financial support/social worker    PLAN:  -Labs reviewed, Adequate for treatment, we'll proceed to cycle 3 Adriamycin and Cytoxan today, and in 2 weeks -Lab, flush, and chemo weekly taxol X4, starting on 10/5 -F/u on 10/5    All questions were answered. The patient knows to call the clinic with any problems, questions or concerns. I spent 20 minutes counseling the patient face to face. The total time spent in the appointment was 25 minutes and more than 50% was on counseling.  This document serves as a record of services personally performed by [name], MD. It was created on her behalf by Joslyn Devon, a trained medical scribe. The creation of this record is based on the scribe's personal observations and the provider's statements to them. This document has been checked and approved by the attending provider.     [name], MD 02/13/2017 11:16 AM

## 2017-02-13 ENCOUNTER — Encounter: Payer: Self-pay | Admitting: *Deleted

## 2017-02-13 ENCOUNTER — Ambulatory Visit (HOSPITAL_BASED_OUTPATIENT_CLINIC_OR_DEPARTMENT_OTHER): Payer: Medicaid Other | Admitting: Hematology

## 2017-02-13 ENCOUNTER — Ambulatory Visit: Payer: Medicaid Other

## 2017-02-13 ENCOUNTER — Ambulatory Visit: Payer: Self-pay

## 2017-02-13 ENCOUNTER — Other Ambulatory Visit (HOSPITAL_BASED_OUTPATIENT_CLINIC_OR_DEPARTMENT_OTHER): Payer: Medicaid Other

## 2017-02-13 ENCOUNTER — Ambulatory Visit (HOSPITAL_BASED_OUTPATIENT_CLINIC_OR_DEPARTMENT_OTHER): Payer: Medicaid Other

## 2017-02-13 ENCOUNTER — Other Ambulatory Visit: Payer: Self-pay

## 2017-02-13 VITALS — BP 133/78 | HR 78 | Temp 98.4°F | Resp 20 | Ht 64.0 in | Wt 185.9 lb

## 2017-02-13 DIAGNOSIS — C50412 Malignant neoplasm of upper-outer quadrant of left female breast: Secondary | ICD-10-CM | POA: Diagnosis not present

## 2017-02-13 DIAGNOSIS — Z170 Estrogen receptor positive status [ER+]: Principal | ICD-10-CM

## 2017-02-13 DIAGNOSIS — Z95828 Presence of other vascular implants and grafts: Secondary | ICD-10-CM

## 2017-02-13 DIAGNOSIS — Z5111 Encounter for antineoplastic chemotherapy: Secondary | ICD-10-CM

## 2017-02-13 LAB — COMPREHENSIVE METABOLIC PANEL
ALBUMIN: 3.7 g/dL (ref 3.5–5.0)
ALK PHOS: 102 U/L (ref 40–150)
ALT: 20 U/L (ref 0–55)
ANION GAP: 9 meq/L (ref 3–11)
AST: 14 U/L (ref 5–34)
BUN: 18.6 mg/dL (ref 7.0–26.0)
CALCIUM: 9.2 mg/dL (ref 8.4–10.4)
CO2: 24 mEq/L (ref 22–29)
Chloride: 107 mEq/L (ref 98–109)
Creatinine: 0.8 mg/dL (ref 0.6–1.1)
EGFR: >90 mL/min/{1.73_m2} (ref >90)
Glucose: 110 mg/dl (ref 70–140)
POTASSIUM: 3.8 meq/L (ref 3.5–5.1)
Sodium: 140 mEq/L (ref 136–145)
Total Bilirubin: <0.22 mg/dL (ref 0.20–1.20)
Total Protein: 6.9 g/dL (ref 6.4–8.3)

## 2017-02-13 LAB — CBC WITH DIFFERENTIAL/PLATELET
BASO%: 0.7 % (ref 0.0–2.0)
BASOS ABS: 0.1 10*3/uL (ref 0.0–0.1)
EOS ABS: 0.1 10*3/uL (ref 0.0–0.5)
EOS%: 0.3 % (ref 0.0–7.0)
HEMATOCRIT: 36 % (ref 34.8–46.6)
HEMOGLOBIN: 12.3 g/dL (ref 11.6–15.9)
LYMPH#: 1.3 10*3/uL (ref 0.9–3.3)
LYMPH%: 6.8 % — ABNORMAL LOW (ref 14.0–49.7)
MCH: 29.3 pg (ref 25.1–34.0)
MCHC: 34.2 g/dL (ref 31.5–36.0)
MCV: 85.6 fL (ref 79.5–101.0)
MONO#: 0.9 10*3/uL (ref 0.1–0.9)
MONO%: 4.8 % (ref 0.0–14.0)
NEUT#: 17.2 10*3/uL — ABNORMAL HIGH (ref 1.5–6.5)
NEUT%: 87.4 % — AB (ref 38.4–76.8)
PLATELETS: 146 10*3/uL (ref 145–400)
RBC: 4.2 10*6/uL (ref 3.70–5.45)
RDW: 13.9 % (ref 11.2–14.5)
WBC: 19.7 10*3/uL — ABNORMAL HIGH (ref 3.9–10.3)

## 2017-02-13 MED ORDER — SODIUM CHLORIDE 0.9 % IV SOLN
Freq: Once | INTRAVENOUS | Status: AC
  Administered 2017-02-13: 12:00:00 via INTRAVENOUS
  Filled 2017-02-13: qty 5

## 2017-02-13 MED ORDER — SODIUM CHLORIDE 0.9 % IV SOLN
600.0000 mg/m2 | Freq: Once | INTRAVENOUS | Status: AC
  Administered 2017-02-13: 1160 mg via INTRAVENOUS
  Filled 2017-02-13: qty 58

## 2017-02-13 MED ORDER — HEPARIN SOD (PORK) LOCK FLUSH 100 UNIT/ML IV SOLN
500.0000 [IU] | Freq: Once | INTRAVENOUS | Status: AC | PRN
  Administered 2017-02-13: 500 [IU]
  Filled 2017-02-13: qty 5

## 2017-02-13 MED ORDER — SODIUM CHLORIDE 0.9% FLUSH
10.0000 mL | Freq: Once | INTRAVENOUS | Status: AC
  Administered 2017-02-13: 10 mL
  Filled 2017-02-13: qty 10

## 2017-02-13 MED ORDER — PALONOSETRON HCL INJECTION 0.25 MG/5ML
0.2500 mg | Freq: Once | INTRAVENOUS | Status: AC
  Administered 2017-02-13: 0.25 mg via INTRAVENOUS

## 2017-02-13 MED ORDER — PEGFILGRASTIM 6 MG/0.6ML ~~LOC~~ PSKT
6.0000 mg | PREFILLED_SYRINGE | Freq: Once | SUBCUTANEOUS | Status: AC
  Administered 2017-02-13: 6 mg via SUBCUTANEOUS
  Filled 2017-02-13: qty 0.6

## 2017-02-13 MED ORDER — SODIUM CHLORIDE 0.9% FLUSH
10.0000 mL | INTRAVENOUS | Status: DC | PRN
  Administered 2017-02-13: 10 mL
  Filled 2017-02-13: qty 10

## 2017-02-13 MED ORDER — PALONOSETRON HCL INJECTION 0.25 MG/5ML
INTRAVENOUS | Status: AC
  Filled 2017-02-13: qty 5

## 2017-02-13 MED ORDER — SODIUM CHLORIDE 0.9 % IV SOLN
Freq: Once | INTRAVENOUS | Status: AC
  Administered 2017-02-13: 12:00:00 via INTRAVENOUS

## 2017-02-13 MED ORDER — DOXORUBICIN HCL CHEMO IV INJECTION 2 MG/ML
60.0000 mg/m2 | Freq: Once | INTRAVENOUS | Status: AC
Start: 2017-02-13 — End: 2017-02-13
  Administered 2017-02-13: 116 mg via INTRAVENOUS
  Filled 2017-02-13: qty 58

## 2017-02-13 NOTE — Patient Instructions (Signed)
[facility] Discharge Instructions for Patients Receiving Chemotherapy  Today you received the following chemotherapy agents Adriamycin and Cytoxan   To help prevent nausea and vomiting after your treatment, we encourage you to take your nausea medication as directed. No Zofran for 3 days. Take Compazine instead.    If you develop nausea and vomiting that is not controlled by your nausea medication, call the clinic.   BELOW ARE SYMPTOMS THAT SHOULD BE REPORTED IMMEDIATELY:  *FEVER GREATER THAN 100.5 F  *CHILLS WITH OR WITHOUT FEVER  NAUSEA AND VOMITING THAT IS NOT CONTROLLED WITH YOUR NAUSEA MEDICATION  *UNUSUAL SHORTNESS OF BREATH  *UNUSUAL BRUISING OR BLEEDING  TENDERNESS IN MOUTH AND THROAT WITH OR WITHOUT PRESENCE OF ULCERS  *URINARY PROBLEMS  *BOWEL PROBLEMS  UNUSUAL RASH Items with * indicate a potential emergency and should be followed up as soon as possible.  Feel free to call the clinic you have any questions or concerns. The clinic phone number is [phone].  Please show the CHEMO ALERT CARD at check-in to the Emergency Department and triage nurse.   

## 2017-02-14 ENCOUNTER — Encounter: Payer: Self-pay | Admitting: Hematology

## 2017-02-16 ENCOUNTER — Ambulatory Visit: Payer: Self-pay

## 2017-02-16 ENCOUNTER — Other Ambulatory Visit: Payer: Self-pay

## 2017-02-16 ENCOUNTER — Ambulatory Visit: Payer: Self-pay | Admitting: Hematology

## 2017-02-16 ENCOUNTER — Telehealth: Payer: Self-pay | Admitting: Hematology

## 2017-02-16 NOTE — Telephone Encounter (Signed)
Called patient to discuss the appointments that had been scheduled for October. You needed to have a remote access code to leave a voicemail so I am sending her a confirmation letter.

## 2017-02-18 ENCOUNTER — Telehealth: Payer: Self-pay | Admitting: Genetics

## 2017-02-18 NOTE — Telephone Encounter (Signed)
Rescheduled her genetics appointment to 9/27 at 12:30 pm.

## 2017-02-19 ENCOUNTER — Encounter: Payer: No Typology Code available for payment source | Admitting: Genetics

## 2017-02-19 ENCOUNTER — Other Ambulatory Visit: Payer: No Typology Code available for payment source

## 2017-02-23 NOTE — Progress Notes (Addendum)
[facility]  [phone] [phone]  Clinic Follow-up Note   Patient Care Team: [name], MD as PCP - General (Internal Medicine) [name], MD as Consulting Physician (General Surgery) [name], MD as Consulting Physician (Hematology) [name], MD as Consulting Physician (Radiation Oncology) 02/27/2017  CHIEF COMPLAINTS:  Follow-up for Malignant neoplasm of upper-outer quadrant of left breast in female, estrogen receptor positive   Oncology History   Cancer Staging Malignant neoplasm of upper-outer quadrant of left breast in female, estrogen receptor positive ([place]) Staging form: Breast, AJCC 8th Edition - Clinical stage from 12/29/2016: Stage IIIA (cT2, cN1, cM0, G3, ER: Positive, PR: Negative, HER2: Negative) - Signed by [name], MD on 01/05/2017       Malignant neoplasm of upper-outer quadrant of left breast in female, estrogen receptor positive ([address])   12/25/2016 Mammogram    [place] and [facility] Diagnostic Breast Tomo Bilateral 12/25/16 IMPRESSION: 1. Suspicious mass 2.3 x 1.6 x 3.0 cm  in the 130 o'clock location of the left breast 8cm from the nipple, biopsy recommended. Adjacent simple cyst is 1.9 cm. 2. Suspicious left axillary lymph node 2.2cm for which biopsy is indicated. 3. Indeterminate group of calcifications in the upper central portion of the left breast for which biopsy is recommended.       12/29/2016 Initial Biopsy    Diagnosis 12/29/16 1. Breast, left, needle core biopsy, stereotactic, upper inner quadrant - FIBROCYSTIC CHANGES INCLUDING APOCRINE METAPLASIA WITH CALCIFICATIONS - NO MALIGNANCY IDENTIFIED 2. Breast, left, needle core biopsy, ultrasound, 1:30 o'clock - INVASIVE MAMMARY CARCINOMA, G3 - SEE COMMENT 3. Lymph node, needle/core biopsy, ultrasound, left axillary - METASTIC CARCINOMA INVOLVING ONE LYMPH NODE (1/1) E-cadherin is positive supporting a ductal origin.       12/29/2016 Receptors her2    ER  15%+, weak staining  PR - HER2- Ki67 10%       12/29/2016 Initial Diagnosis    Malignant neoplasm of upper-outer quadrant of left breast in female, estrogen receptor positive ([place])      01/13/2017 Echocardiogram    ECHO 01/13/17 Study Conclusions - Left ventricle: The cavity size was normal. Systolic function was   normal. The estimated ejection fraction was in the range of 60%   to 65%. Wall motion was normal; there were no regional wall   motion abnormalities. Left ventricular diastolic function   parameters were normal. - Mitral valve: There was trivial regurgitation. - Right atrium: The atrium was mildly dilated. - Tricuspid valve: There was trivial regurgitation.       01/14/2017 Surgery    Port placement by Dr. Donne Hazel       01/15/2017 Imaging    Whole Body bone Scan 01/15/17 IMPRESSION: 1.  No significant abnormality identified.      01/15/2017 Imaging    CT CAP W Contrast 01/15/17 IMPRESSION: 1. Left lateral breast mass with pathologic left axillary adenopathy including a 2.0 cm left axillary lymph node. 2. Several hypodense lesions in the liver are likely benign lesions such as cysts. However, these are technically too small to characterize by CT. Possibilities for further workup include surveillance or hepatic protocol MRI with and without contrast. 3. There is also a tiny hypodense lesion in the spleen which is technically nonspecific but statistically highly likely to be benign. 4.  Prominent stool throughout the colon favors constipation      01/16/2017 -  Chemotherapy    Neoadjuvant chemo AC every 2 weeks for 4 cycles starting 01/16/17, then proceed with  weekly taxol for 12 treatments, starting on 03/13/2017      HISTORY OF PRESENTING ILLNESS: 01/07/17 Cleta Alberts Quinto 41 y.o. female is here because of newly diagnosed Malignant neoplasm of upper-outer quadrant of left breast in female, estrogen receptor positive. She presents to the breast clinic today with her  common law husband. She felt the lump herself 3 months ago. She felt pain like "stabbing" first. She then had a mammogram 12/25/16 and subsequent biopsy   In the past she was using smoking match and quit smoking "cold [place]" that was 1 month ago.   Today she reports her breast pain still comes and goes. She has not noticed any new changes. 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 Lately has regular period. She has a weak cervix and her children were born premature. She is currently working at [employer] and works on her feet all the time, working 5 days a week.   GYN HISTORY  Menarchal: 10 LMP: 12/10/16 Contraceptive: No HRT: NA GP: G2P1 - twins    CURRENT THERAPY: Neoadjuvant chemo AC every 2 weeks for 4 cycles starting 01/16/17, then proceed with weekly taxol for 12 treatments, starting on 03/13/2017  INTERVAL HISTORY:  AHRIA SLAPPEY is here with her husband for a follow-up and cycle 4 of AC. She tolerated her last cycle of before meals on 02/13/2017 without difficulty. She had minimal nausea, did not require medication. She had constipation after chemotherapy lasting a few days that resolved spontaneously. No diarrhes. She has mild bone pain with Neulasta, controlled with Tylenol and Claritin. Today she reports a good appetite and energy level. She has a history of frequent UTI and chronic irritating bladder symptoms requiring Macrodantin in the past. She had a kidney infection when she was pregnant 16 years ago. She has intermittent pain with urination for which she takes cranberry pills and this resolves. While under chemotherapy she reports this is getting worse. Recently she has had dysuria, nocturia 2-3 times per night, and urinary frequency. She denies blood in her urine. No fever, chest pain, cough, shortness of breath.  MEDICAL HISTORY:  Past Medical History:  Diagnosis Date  . Cancer [facility])    left breast  cancer  . Dermatologic problem    possible psoriasis. Pt has not had been diagnosed by dermatology.  Marland Kitchen GERD (gastroesophageal reflux disease)   . Tobacco dependence    trying to quit with nicotene patches   SURGICAL HISTORY: Past Surgical History:  Procedure Laterality Date  . CESAREAN SECTION    . PORTACATH PLACEMENT N/A 01/14/2017   Procedure: INSERTION PORT-A-CATH WITH [place];  Surgeon: [name], MD;  Location: [facility];  Service: General;  Laterality: N/A;   SOCIAL HISTORY: Social History   Social History  . Marital status: Married    Spouse name: N/A  . Number of children: N/A  . Years of education: N/A   Occupational History  . Not on file.   Social History Main Topics  . Smoking status: Former Smoker    Packs/day: 1.00    Years: 17.00    Types: Cigarettes    Quit date: 12/15/2016  . Smokeless tobacco: Never Used  . Alcohol use No  . Drug use: No  . Sexual activity: Yes    Birth control/ protection: None   Other Topics Concern  . Not on file   Social History Narrative  . No narrative on file  FAMILY HISTORY: Family History  Problem Relation Age of Onset  . Stroke Maternal Grandmother   . Stroke Father   . Hypertension Father    ALLERGIES:  is allergic to amoxicillin and nicotine.  MEDICATIONS:  Current Outpatient Prescriptions  Medication Sig Dispense Refill  . famotidine (PEPCID) 10 MG tablet Take 10 mg by mouth daily as needed for heartburn or indigestion.    Marland Kitchen ibuprofen (ADVIL,MOTRIN) 200 MG tablet Take 200-400 mg by mouth daily as needed for headache or moderate pain.    Marland Kitchen lidocaine-prilocaine (EMLA) cream Apply to affected area once 30 g 3  . traMADol (ULTRAM) 50 MG tablet Take 1 tablet (50 mg total) by mouth every 6 (six) hours as needed. 12 tablet 1  . ondansetron (ZOFRAN) 8 MG tablet Take 1 tablet (8 mg total) by mouth 2 (two) times daily as needed. Start on the third day after chemotherapy. (Patient not taking: Reported on 02/27/2017) 30  tablet 1  . prochlorperazine (COMPAZINE) 10 MG tablet TAKE 1 TABLET EVERY 6 HOURS AS NEEDED FOR NAUSEA OR VOMITING (Patient not taking: Reported on 02/27/2017) 30 tablet 2   No current facility-administered medications for this visit.    Facility-Administered Medications Ordered in Other Visits  Medication Dose Route Frequency Provider Last Rate Last Dose  . 0.9 %  sodium chloride infusion   Intravenous Once [name], MD      . cyclophosphamide (CYTOXAN) 1,160 mg in sodium chloride 0.9 % 250 mL chemo infusion  600 mg/m2 (Treatment Plan Recorded) Intravenous Once [name], MD      . DOXOrubicin (ADRIAMYCIN) chemo injection 116 mg  60 mg/m2 (Treatment Plan Recorded) Intravenous Once [name], MD      . fosaprepitant (EMEND) 150 mg, dexamethasone (DECADRON) 12 mg in sodium chloride 0.9 % 145 mL IVPB   Intravenous Once [name], MD      . heparin lock flush 100 unit/mL  500 Units Intracatheter Once PRN [name], MD      . palonosetron (ALOXI) injection 0.25 mg  0.25 mg Intravenous Once [name], MD      . pegfilgrastim (NEULASTA ONPRO KIT) injection 6 mg  6 mg Subcutaneous Once [name], MD      . sodium chloride flush (NS) 0.9 % injection 10 mL  10 mL Intracatheter PRN [name], MD       REVIEW OF SYSTEMS:  Constitutional: Denies Fatigue, fevers, chills or abnormal night sweats. (+) hair loss Eyes: Denies blurriness of vision, double vision or watery eyes Ears, nose, mouth, throat, and face: Denies mucositis or sore throat Respiratory: Denies cough, dyspnea or wheezes Cardiovascular: Denies palpitation, chest discomfort or lower extremity swelling Gastrointestinal:  Denies nausea, vomiting, diarrhea, heartburn. (+) Constipation 3-4 days after chemotherapy Skin: Denies abnormal skin rashes Lymphatics: Denies new lymphadenopathy or easy bruising Neurological:Denies numbness, tingling or new weaknesses MSK: (+) moderate bone pain with Neulasta Breast: (+) occasional shooting pain in left  breast, palpable mass in left breast Behavioral/Psych: Mood is stable, no new changes  All other systems were reviewed with the patient and are negative.  PHYSICAL EXAMINATION:  ECOG PERFORMANCE STATUS: 1 - Symptomatic but completely ambulatory  Vitals:   02/27/17 0843  BP: 132/80  Pulse: 93  Resp: 18  Temp: 98.7 F (37.1 C)  SpO2: 100%   Filed Weights   02/27/17 0843  Weight: 188 lb 11.2 oz (85.6 kg)   GENERAL:alert, no distress and comfortable SKIN: skin color, texture, turgor are normal, no rashes or  significant lesions EYES: normal, conjunctiva are pink and non-injected, sclera clear OROPHARYNX:no exudate, no erythema and lips, buccal mucosa, and tongue normal  NECK: supple, thyroid normal size, non-tender, without nodularity LYMPH:  no palpable Cervical or supraclavicular lymphadenopathy (+) palpable left axillary node, decreased in size LUNGS: clear to auscultation bilaterally with normal breathing effort HEART: regular rate & rhythm, S1 and S2 present, no murmurs, and no lower extremity edema ABDOMEN:abdomen soft, non-tender and normal bowel sounds. No palpable hepatomegaly or masses. No suprapubic tenderness Musculoskeletal:no cyanosis of digits and no clubbing  PSYCH: alert & oriented x 3 with fluent speech NEURO: no focal motor/sensory deficits Breast: (+) 1.5 x 1 cm mass in UOQ of left breast and movable lymph node in left axilary that is about 0.5 cm, both has decreased in size with neoadjuvant chemotherapy.  LABORATORY DATA:  I have reviewed the data as listed CBC Latest Ref Rng & Units 02/27/2017 02/13/2017 01/30/2017  WBC 3.9 - 10.3 10e3/uL 17.2(H) 19.7(H) 13.5(H)  Hemoglobin 11.6 - 15.9 g/dL 11.9 12.3 12.6  Hematocrit 34.8 - 46.6 % 34.4(L) 36.0 36.4  Platelets 145 - 400 10e3/uL 167 146 164   CMP Latest Ref Rng & Units 02/27/2017 02/13/2017 01/30/2017  Glucose 70 - 140 mg/dl 127 110 116  BUN 7.0 - 26.0 mg/dL 18.5 18.6 15.0  Creatinine 0.6 - 1.1 mg/dL 0.8 0.8 0.8    Sodium 136 - 145 mEq/L 139 140 138  Potassium 3.5 - 5.1 mEq/L 3.9 3.8 3.9  Chloride 101 - 111 mmol/L - - -  CO2 22 - 29 mEq/L 23 24 23   Calcium 8.4 - 10.4 mg/dL 9.2 9.2 9.1  Total Protein 6.4 - 8.3 g/dL 7.0 6.9 6.9  Total Bilirubin 0.20 - 1.20 mg/dL 0.24 <0.22 <0.22  Alkaline Phos 40 - 150 U/L 106 102 87  AST 5 - 34 U/L 16 14 13   ALT 0 - 55 U/L 23 20 16    PATHOLOGY  Diagnosis 12/29/16 1. Breast, left, needle core biopsy, stereotactic, upper inner quadrant - FIBROCYSTIC CHANGES INCLUDING APOCRINE METAPLASIA WITH CALCIFICATIONS - NO MALIGNANCY IDENTIFIED 2. Breast, left, needle core biopsy, ultrasound, 1:30 o'clock - INVASIVE MAMMARY CARCINOMA - SEE COMMENT 3. Lymph node, needle/core biopsy, ultrasound, left axillary - METASTIC CARCINOMA INVOLVING ONE LYMPH NODE (1/1) Microscopic Comment 2. The biopsy material shows an infiltrative proliferation of cells with large vesicular nuclei with conspicuous nucleoli, arranged linearly and in small clusters. There are scattered cells with marked pleomorphism and cytologic atypia. Based on the biopsy, the carcinoma appears Nottingham grade 3 of 3 and measures 0.9 cm in greatest linear extent. E-cadherin and prognostic markers (ER/PR/ki-67/HER2-FISH)are pending and will be reported in an addendum. Dr. Tresa Moore reviewed the case and agrees with the above diagnosis. This case was called to [facility] on December 30, 2016. Results: IMMUNOHISTOCHEMICAL AND MORPHOMETRIC ANALYSIS PERFORMED MANUALLY Estrogen Receptor: 15%, POSITIVE, WEAK STAINING INTENSITY Progesterone Receptor: 0%, NEGATIVE Proliferation Marker Ki67: 10% COMMENT: The negative hormone receptor study(ies) in this case has An internal positive control. Results: HER2 - NEGATIVE RATIO OF HER2/CEP17 SIGNALS 1.63 AVERAGE HER2 COPY NUMBER PER CELL 3.10  PROCEDURES  ECHO 01/13/17 Study Conclusions - Left ventricle: The cavity size was normal. Systolic function was    normal. The estimated ejection fraction was in the range of 60%   to 65%. Wall motion was normal; there were no regional wall   motion abnormalities. Left ventricular diastolic function   parameters were normal. - Mitral valve: There was trivial regurgitation. -  Right atrium: The atrium was mildly dilated. - Tricuspid valve: There was trivial regurgitation.  RADIOGRAPHIC STUDIES: I have personally reviewed the radiological images as listed and agreed with the findings in the report. Mr Abdomen W Wo Contrast  Result Date: 01/29/2017 CLINICAL DATA:  Newly diagnosed breast cancer. Indeterminate liver lesions. EXAM: MRI ABDOMEN WITHOUT AND WITH CONTRAST TECHNIQUE: Multiplanar multisequence MR imaging of the abdomen was performed both before and after the administration of intravenous contrast. CONTRAST:  24m EOVIST GADOXETATE DISODIUM 0.25 MOL/L IV SOLN COMPARISON:  01/15/2017 CT. FINDINGS: Minimal motion degradation primarily involving the portal venous phase post-contrast images. Lower chest: Normal heart size without pericardial or pleural effusion. Hepatobiliary: Hepatomegaly at 19.6 cm craniocaudal. Multiple hepatic cysts, including primarily throughout the left liver lobe on the order of 12 mm or less. No suspicious liver lesion. Normal gallbladder, without biliary ductal dilatation. Pancreas:  Normal, without mass or ductal dilatation. Spleen: 6 mm T2 hyperintense splenic lesion is too small to entirely characterize but likely a cyst or hemangioma. Adrenals/Urinary Tract: Normal adrenal glands. Normal kidneys, without hydronephrosis. Stomach/Bowel: Normal stomach and abdominal bowel loops. Vascular/Lymphatic: Normal caliber of the aorta and branch vessels. No retroperitoneal or retrocrural adenopathy. Other:  No ascites. Musculoskeletal: No acute osseous abnormality. IMPRESSION: 1. No evidence of liver or abdominal metastasis. Multiple liver cysts. 2. Hepatomegaly. Electronically Signed   By: KAbigail MiyamotoM.D.   On: 01/29/2017 14:05   ASSESSMENT & PLAN:  JMAVERY MILLINGis a 41y.o. premenopausal female with no significant past medical history, presented with a palpable left breast mass.  1. Malignant neoplasm of upper-outer quadrant of left breast in female, ductal carcinoma, cT2N1M0, Stage IIIa, ER weakly +, PR - , HER2 - , Grade 3 -M.D. Previously reviewed her mammogram and initial biopsy results in details with patient and her husband.  -Her tumor is weakly ER positive, PR negative, HER-2 negative, grade 3, she has positive lymph nodes, those are all high risks for recurrence. Dr. FBurr Medicorecommended neoadjuvant or adjuvant chemotherapy to reduce her risk of recurrence after surgery. -We again discussed that the benefit of neoadjuvant chemotherapy, to shrink the tumor and lymph nodes, and potentially she would be a candidate for lumpectomy and may not need lymph node dissection if she has good response. I have discussed this with her surgeon Dr. WDonne Hazeltoday, both of u[place]commend her to consider neoadjuvant chemotherapy. She agrees.  -staging scan CT from 01/15/17 shows a few hypodense lesion were seen in the liver, appear to be benign cysts. CT and bone scan otherwise negative for distant metastasis. Dr. FBurr Medicopreviously reviewed with the patient. 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 This was previously discussed with patient. -She has begun dose dense Adriamycin and Cytoxan (AC) every 2 weeks for 4 cycles, followed by weekly Taxol for 12 weeks per Dr. FErnestina Pennarecommendation -She did have some upper back pain/arthralgias following her first cycle, likely secondary to Neulasta, this is manageable with tylenol and claritin. It was recommended that she begin using Colace to remedy any constipation but she prefers to hold medication for now -She is tolerating chemotherapy well, labs reviewed, CMP is unremarkable. WBC and ANC are elevated, she receives Neulasta, adequate for  treatment.  -Palpable breast and axillary mass appear to be decreasing with treatment, indicating good clinical response to chemo. -We'll proceed with cycle 4 AC today -She will begin weekly taxol 03/13/17; Dr. FBurr Medicoreviewed that carboplatin may be added if genetic testing (+) for  BRCA mutation  -F/u in 2 weeks   2. Genetics -Due to her young age of diagnosis I suggest she get genetic testing. She agrees and was referred to genetics -Appointment on 03/05/2017  3. Financial Support -They are not insured and are attempting to get an orange card.  -They have met our financial support/social worker   4. Dysuria, urinary frequency -She has history of UTI and irritating bladder symptoms, present before chemotherapy but worsening on AC -I reviewed that Cytoxan carries a risk of hemorrhagic cystitis, I have a low suspicion for this. -I encouraged her to increase her water intake -UA today shows UTI and trace hematuria, culture pending -Rx send to pharmacy Cipro 500 mg BID x5 days  PLAN:  -labs reviewed, adequate for treatment; we'll proceed with cycle 4 adriamycin and cytoxan today -Prescription for Cipro 500 mg BID x5 days for UTI, she will call with rash, GI upset, or new problems Genetics apt 03/05/17 -lab, flush, f/u and begin weekly taxol 03/13/17 -Echo and cardiology apt 04/2017 -I recommend flu shot today, pt is reluctant to take it but will think about it   All questions were answered. The patient knows to call the clinic with any problems, questions or concerns. I spent 20 minutes counseling the patient face to face. The total time spent in the appointment was 25 minutes and more than 50% was on counseling.  Cira Rue, AGNP-C 02/27/2017  I have seen the patient, examined her. I agree with the assessment and and plan and have edited the notes.   [name]  02/27/2017

## 2017-02-27 ENCOUNTER — Ambulatory Visit: Payer: Medicaid Other

## 2017-02-27 ENCOUNTER — Other Ambulatory Visit (HOSPITAL_BASED_OUTPATIENT_CLINIC_OR_DEPARTMENT_OTHER): Payer: Medicaid Other

## 2017-02-27 ENCOUNTER — Ambulatory Visit (HOSPITAL_BASED_OUTPATIENT_CLINIC_OR_DEPARTMENT_OTHER): Payer: Medicaid Other

## 2017-02-27 ENCOUNTER — Encounter: Payer: Self-pay | Admitting: Hematology

## 2017-02-27 ENCOUNTER — Ambulatory Visit (HOSPITAL_BASED_OUTPATIENT_CLINIC_OR_DEPARTMENT_OTHER): Payer: Medicaid Other | Admitting: Hematology

## 2017-02-27 VITALS — BP 132/80 | HR 93 | Temp 98.7°F | Resp 18 | Ht 64.0 in | Wt 188.7 lb

## 2017-02-27 DIAGNOSIS — Z170 Estrogen receptor positive status [ER+]: Principal | ICD-10-CM

## 2017-02-27 DIAGNOSIS — R350 Frequency of micturition: Secondary | ICD-10-CM | POA: Diagnosis not present

## 2017-02-27 DIAGNOSIS — C50412 Malignant neoplasm of upper-outer quadrant of left female breast: Secondary | ICD-10-CM

## 2017-02-27 DIAGNOSIS — Z95828 Presence of other vascular implants and grafts: Secondary | ICD-10-CM

## 2017-02-27 DIAGNOSIS — R300 Dysuria: Secondary | ICD-10-CM | POA: Diagnosis not present

## 2017-02-27 DIAGNOSIS — Z5111 Encounter for antineoplastic chemotherapy: Secondary | ICD-10-CM | POA: Diagnosis not present

## 2017-02-27 DIAGNOSIS — N3001 Acute cystitis with hematuria: Secondary | ICD-10-CM

## 2017-02-27 LAB — CBC WITH DIFFERENTIAL/PLATELET
BASO%: 1.1 % (ref 0.0–2.0)
Basophils Absolute: 0.2 10*3/uL — ABNORMAL HIGH (ref 0.0–0.1)
EOS%: 0.6 % (ref 0.0–7.0)
Eosinophils Absolute: 0.1 10*3/uL (ref 0.0–0.5)
HEMATOCRIT: 34.4 % — AB (ref 34.8–46.6)
HEMOGLOBIN: 11.9 g/dL (ref 11.6–15.9)
LYMPH#: 1.1 10*3/uL (ref 0.9–3.3)
LYMPH%: 6.5 % — ABNORMAL LOW (ref 14.0–49.7)
MCH: 29.6 pg (ref 25.1–34.0)
MCHC: 34.5 g/dL (ref 31.5–36.0)
MCV: 85.7 fL (ref 79.5–101.0)
MONO#: 1 10*3/uL — AB (ref 0.1–0.9)
MONO%: 5.7 % (ref 0.0–14.0)
NEUT%: 86.1 % — AB (ref 38.4–76.8)
NEUTROS ABS: 14.8 10*3/uL — AB (ref 1.5–6.5)
Platelets: 167 10*3/uL (ref 145–400)
RBC: 4.01 10*6/uL (ref 3.70–5.45)
RDW: 14.9 % — ABNORMAL HIGH (ref 11.2–14.5)
WBC: 17.2 10*3/uL — ABNORMAL HIGH (ref 3.9–10.3)

## 2017-02-27 LAB — URINALYSIS, MICROSCOPIC - CHCC
Bilirubin (Urine): NEGATIVE
GLUCOSE UR CHCC: NEGATIVE mg/dL
KETONES: NEGATIVE mg/dL
Nitrite: POSITIVE
PH: 7.5 (ref 4.6–8.0)
SPECIFIC GRAVITY, URINE: 1.015 (ref 1.003–1.035)
UROBILINOGEN UR: 0.2 mg/dL (ref 0.2–1)

## 2017-02-27 LAB — COMPREHENSIVE METABOLIC PANEL
ALBUMIN: 3.7 g/dL (ref 3.5–5.0)
ALK PHOS: 106 U/L (ref 40–150)
ALT: 23 U/L (ref 0–55)
AST: 16 U/L (ref 5–34)
Anion Gap: 10 mEq/L (ref 3–11)
BILIRUBIN TOTAL: 0.24 mg/dL (ref 0.20–1.20)
BUN: 18.5 mg/dL (ref 7.0–26.0)
CALCIUM: 9.2 mg/dL (ref 8.4–10.4)
CHLORIDE: 106 meq/L (ref 98–109)
CO2: 23 mEq/L (ref 22–29)
CREATININE: 0.8 mg/dL (ref 0.6–1.1)
EGFR: >90 mL/min/{1.73_m2} (ref >90)
Glucose: 127 mg/dl (ref 70–140)
Potassium: 3.9 mEq/L (ref 3.5–5.1)
Sodium: 139 mEq/L (ref 136–145)
TOTAL PROTEIN: 7 g/dL (ref 6.4–8.3)

## 2017-02-27 MED ORDER — SODIUM CHLORIDE 0.9 % IV SOLN
600.0000 mg/m2 | Freq: Once | INTRAVENOUS | Status: AC
  Administered 2017-02-27: 1160 mg via INTRAVENOUS
  Filled 2017-02-27: qty 58

## 2017-02-27 MED ORDER — SODIUM CHLORIDE 0.9 % IV SOLN
Freq: Once | INTRAVENOUS | Status: AC
  Administered 2017-02-27: 10:00:00 via INTRAVENOUS

## 2017-02-27 MED ORDER — PALONOSETRON HCL INJECTION 0.25 MG/5ML
INTRAVENOUS | Status: AC
  Filled 2017-02-27: qty 5

## 2017-02-27 MED ORDER — FOSAPREPITANT DIMEGLUMINE INJECTION 150 MG
Freq: Once | INTRAVENOUS | Status: AC
  Administered 2017-02-27: 11:00:00 via INTRAVENOUS
  Filled 2017-02-27: qty 5

## 2017-02-27 MED ORDER — CIPROFLOXACIN HCL 500 MG PO TABS: 500.0000 mg | ORAL_TABLET | Freq: Two times a day (BID) | ORAL | 0 refills | Status: DC

## 2017-02-27 MED ORDER — HEPARIN SOD (PORK) LOCK FLUSH 100 UNIT/ML IV SOLN
500.0000 [IU] | Freq: Once | INTRAVENOUS | Status: AC | PRN
  Administered 2017-02-27: 500 [IU]
  Filled 2017-02-27: qty 5

## 2017-02-27 MED ORDER — DOXORUBICIN HCL CHEMO IV INJECTION 2 MG/ML
60.0000 mg/m2 | Freq: Once | INTRAVENOUS | Status: AC
  Administered 2017-02-27: 116 mg via INTRAVENOUS
  Filled 2017-02-27: qty 58

## 2017-02-27 MED ORDER — SODIUM CHLORIDE 0.9% FLUSH
10.0000 mL | INTRAVENOUS | Status: DC | PRN
  Administered 2017-02-27: 10 mL via INTRAVENOUS
  Filled 2017-02-27: qty 10

## 2017-02-27 MED ORDER — SODIUM CHLORIDE 0.9% FLUSH
10.0000 mL | INTRAVENOUS | Status: DC | PRN
  Administered 2017-02-27: 10 mL
  Filled 2017-02-27: qty 10

## 2017-02-27 MED ORDER — PEGFILGRASTIM 6 MG/0.6ML ~~LOC~~ PSKT
6.0000 mg | PREFILLED_SYRINGE | Freq: Once | SUBCUTANEOUS | Status: AC
  Administered 2017-02-27: 6 mg via SUBCUTANEOUS
  Filled 2017-02-27: qty 0.6

## 2017-02-27 MED ORDER — CIPROFLOXACIN HCL 500 MG PO TABS: 500.0000 mg | ORAL_TABLET | Freq: Two times a day (BID) | ORAL | 0 refills | Status: AC

## 2017-02-27 MED ORDER — PALONOSETRON HCL INJECTION 0.25 MG/5ML
0.2500 mg | Freq: Once | INTRAVENOUS | Status: AC
  Administered 2017-02-27: 0.25 mg via INTRAVENOUS

## 2017-02-27 MED FILL — CIPROFLOXACIN HCL 500 MG TA: 500 | 5 days supply | Qty: 10 | Fill #0

## 2017-02-27 NOTE — Patient Instructions (Addendum)
[facility] Discharge Instructions for Patients Receiving Chemotherapy  Today you received the following chemotherapy agents Doxorubicin and Cytoxan  To help prevent nausea and vomiting after your treatment, we encourage you to take your nausea medication as directed   If you develop nausea and vomiting that is not controlled by your nausea medication, call the clinic.   BELOW ARE SYMPTOMS THAT SHOULD BE REPORTED IMMEDIATELY:  *FEVER GREATER THAN 100.5 F  *CHILLS WITH OR WITHOUT FEVER  NAUSEA AND VOMITING THAT IS NOT CONTROLLED WITH YOUR NAUSEA MEDICATION  *UNUSUAL SHORTNESS OF BREATH  *UNUSUAL BRUISING OR BLEEDING  TENDERNESS IN MOUTH AND THROAT WITH OR WITHOUT PRESENCE OF ULCERS  *URINARY PROBLEMS  *BOWEL PROBLEMS  UNUSUAL RASH Items with * indicate a potential emergency and should be followed up as soon as possible.  Feel free to call the clinic should you have any questions or concerns. The clinic phone number is [phone].  Please show the [facility] at check-in to the Emergency Department and triage nurse.  Pegfilgrastim injection What is this medicine? PEGFILGRASTIM (PEG fil gra stim) is a long-acting granulocyte colony-stimulating factor that stimulates the growth of neutrophils, a type of white blood cell important in the body's fight against infection. It is used to reduce the incidence of fever and infection in patients with certain types of cancer who are receiving chemotherapy that affects the bone marrow, and to increase survival after being exposed to high doses of radiation. This medicine may be used for other purposes; ask your health care provider or pharmacist if you have questions. COMMON BRAND NAME(S): Neulasta What should I tell my health care provider before I take this medicine? They need to know if you have any of these conditions: -kidney disease -latex allergy -ongoing radiation therapy -sickle cell disease -skin  reactions to acrylic adhesives (On-Body Injector only) -an unusual or allergic reaction to pegfilgrastim, filgrastim, other medicines, foods, dyes, or preservatives -pregnant or trying to get pregnant -breast-feeding How should I use this medicine? This medicine is for injection under the skin. If you get this medicine at home, you will be taught how to prepare and give the pre-filled syringe or how to use the On-body Injector. Refer to the patient Instructions for Use for detailed instructions. Use exactly as directed. Tell your healthcare provider immediately if you suspect that the On-body Injector may not have performed as intended or if you suspect the use of the On-body Injector resulted in a missed or partial dose. It is important that you put your used needles and syringes in a special sharps container. Do not put them in a trash can. If you do not have a sharps container, call your pharmacist or healthcare provider to get one. Talk to your pediatrician regarding the use of this medicine in children. While this drug may be prescribed for selected conditions, precautions do apply. Overdosage: If you think you have taken too much of this medicine contact a poison control center or emergency room at once. NOTE: This medicine is only for you. Do not share this medicine with others. What if I miss a dose? It is important not to miss your dose. Call your doctor or health care professional if you miss your dose. If you miss a dose due to an On-body Injector failure or leakage, a new dose should be administered as soon as possible using a single prefilled syringe for manual use. What may interact with this medicine? Interactions have not been studied. Give  your health care provider a list of all the medicines, herbs, non-prescription drugs, or dietary supplements you use. Also tell them if you smoke, drink alcohol, or use illegal drugs. Some items may interact with your medicine. This list may not  describe all possible interactions. Give your health care provider a list of all the medicines, herbs, non-prescription drugs, or dietary supplements you use. Also tell them if you smoke, drink alcohol, or use illegal drugs. Some items may interact with your medicine. What should I watch for while using this medicine? You may need blood work done while you are taking this medicine. If you are going to need a MRI, CT scan, or other procedure, tell your doctor that you are using this medicine (On-Body Injector only). What side effects may I notice from receiving this medicine? Side effects that you should report to your doctor or health care professional as soon as possible: -allergic reactions like skin rash, itching or hives, swelling of the face, lips, or tongue -dizziness -fever -pain, redness, or irritation at site where injected -pinpoint red spots on the skin -red or dark-brown urine -shortness of breath or breathing problems -stomach or side pain, or pain at the shoulder -swelling -tiredness -trouble passing urine or change in the amount of urine Side effects that usually do not require medical attention (report to your doctor or health care professional if they continue or are bothersome): -bone pain -muscle pain This list may not describe all possible side effects. Call your doctor for medical advice about side effects. You may report side effects to FDA at 1-800-FDA-1088. Where should I keep my medicine? Keep out of the reach of children. Store pre-filled syringes in a refrigerator between 2 and 8 degrees C (36 and 46 degrees F). Do not freeze. Keep in carton to protect from light. Throw away this medicine if it is left out of the refrigerator for more than 48 hours. Throw away any unused medicine after the expiration date. NOTE: This sheet is a summary. It may not cover all possible information. If you have questions about this medicine, talk to your doctor, pharmacist, or health  care provider.  2018 Elsevier/Gold Standard (2016-05-22 12:58:03)

## 2017-03-01 LAB — URINE CULTURE

## 2017-03-05 ENCOUNTER — Ambulatory Visit (HOSPITAL_BASED_OUTPATIENT_CLINIC_OR_DEPARTMENT_OTHER): Payer: Medicaid Other | Admitting: Genetic Counselor

## 2017-03-05 ENCOUNTER — Other Ambulatory Visit: Payer: Medicaid Other

## 2017-03-05 ENCOUNTER — Encounter: Payer: Self-pay | Admitting: Genetic Counselor

## 2017-03-05 DIAGNOSIS — C50412 Malignant neoplasm of upper-outer quadrant of left female breast: Secondary | ICD-10-CM | POA: Diagnosis not present

## 2017-03-05 DIAGNOSIS — Z315 Encounter for genetic counseling: Secondary | ICD-10-CM

## 2017-03-05 DIAGNOSIS — Z1379 Encounter for other screening for genetic and chromosomal anomalies: Secondary | ICD-10-CM

## 2017-03-05 DIAGNOSIS — Z170 Estrogen receptor positive status [ER+]: Principal | ICD-10-CM

## 2017-03-05 HISTORY — DX: Encounter for other screening for genetic and chromosomal anomalies: Z13.79

## 2017-03-05 NOTE — Progress Notes (Signed)
[name]      Initial Visit   Patient Name: Kathryn Stanley Patient DOB: 1975-09-19 Patient Age: 41 y.o. Encounter Date: 03/05/2017  Referring Provider: [name], MD  Primary Care Provider: [name], MD  Reason for Visit: Evaluate for hereditary susceptibility to cancer    Assessment and Plan:  . [name]'s history of breast cancer at age 77 is suggestive of a hereditary predisposition to cancer, but her family history is non-contributory. Her father was adopted. She meets NCCN criteria for genetic testing due to her young age at diagnosis.   . Testing is recommended to determine whether she has a pathogenic mutation that will impact her screening and risk-reduction for cancer. A negative result will be reassuring.  . [name] wished to pursue genetic testing and a blood sample will be sent for analysis of the 83 genes on [employer]'s Multi-Cancer panel (ALK, APC, ATM, AXIN2, BAP1, BARD1, BLM, BMPR1A, BRCA1, BRCA2, BRIP1, CASR, CDC73, CDH1, CDK4, CDKN1B, CDKN1C, CDKN2A, CEBPA, CHEK2, CTNNA1, DICER1, DIS3L2, EGFR, EPCAM, FH, FLCN, GATA2, GPC3, GREM1, HOXB13, HRAS, KIT, MAX, MEN1, MET, MITF, MLH1, MSH2, MSH3, MSH6, MUTYH, NBN, NF1, NF2, NTHL1, PALB2, PDGFRA, PHOX2B, PMS2, POLD1, POLE, POT1, PRKAR1A, PTCH1, PTEN, RAD50, RAD51C, RAD51D, RB1, RECQL4, RET, RUNX1, SDHA, SDHAF2, SDHB, SDHC, SDHD, SMAD4, SMARCA4, SMARCB1, SMARCE1, STK11, SUFU, TERC, TERT, TMEM127, TP53, TSC1, TSC2, VHL, WRN, WT1).   . Results should be available in approximately 2-4 weeks, at which point we will contact her and address implications for her as well as address genetic testing for at-risk family members, if needed.     Dr. Burr Medico was available for questions concerning this case. Total time spent by me in face-to-face counseling was approximately 35 minutes.   _____________________________________________________________________   History of Present Illness: Ms. Kathryn Stanley, a 41 y.o. female, is being seen at the [facility] due to a personal history of breast cancer. She presents to clinic today with her husband, Kathryn Stanley, to discuss the possibility of a hereditary predisposition to cancer and discuss whether genetic testing is warranted.  Ms. Sandusky was diagnosed with breast cancer at the age of 70. She is currently receiving neoadjuvant chemotherapy.    Oncology History   Cancer Staging Malignant neoplasm of upper-outer quadrant of left breast in female, estrogen receptor positive ([place]) Staging form: Breast, AJCC 8th Edition - Clinical stage from 12/29/2016: Stage IIIA (cT2, cN1, cM0, G3, ER: Positive, PR: Negative, HER2: Negative) - Signed by [name], MD on 01/05/2017       Malignant neoplasm of upper-outer quadrant of left breast in female, estrogen receptor positive ([place])   12/25/2016 Mammogram    [place] and [facility] Diagnostic Breast Tomo Bilateral 12/25/16 IMPRESSION: 1. Suspicious mass 2.3 x 1.6 x 3.0 cm  in the 130 o'clock location of the left breast 8cm from the nipple, biopsy recommended. Adjacent simple cyst is 1.9 cm. 2. Suspicious left axillary lymph node 2.2cm for which biopsy is indicated. 3. Indeterminate group of calcifications in the upper central portion of the left breast for which biopsy is recommended.       12/29/2016 Initial Biopsy    Diagnosis 12/29/16 1. Breast, left, needle core biopsy, stereotactic, upper inner quadrant - FIBROCYSTIC CHANGES INCLUDING APOCRINE METAPLASIA WITH CALCIFICATIONS - NO MALIGNANCY IDENTIFIED 2. Breast, left, needle core biopsy, ultrasound, 1:30 o'clock - INVASIVE MAMMARY CARCINOMA, G3 - SEE COMMENT 3. Lymph node, needle/core biopsy, ultrasound, left axillary - METASTIC CARCINOMA  INVOLVING ONE LYMPH NODE (1/1) E-cadherin is positive supporting a ductal origin.       12/29/2016 Receptors her2    ER 15%+, weak staining  PR - HER2- Ki67 10%       12/29/2016 Initial Diagnosis      Malignant neoplasm of upper-outer quadrant of left breast in female, estrogen receptor positive ([place])      01/13/2017 Echocardiogram    ECHO 01/13/17 Study Conclusions - Left ventricle: The cavity size was normal. Systolic function was   normal. The estimated ejection fraction was in the range of 60%   to 65%. Wall motion was normal; there were no regional wall   motion abnormalities. Left ventricular diastolic function   parameters were normal. - Mitral valve: There was trivial regurgitation. - Right atrium: The atrium was mildly dilated. - Tricuspid valve: There was trivial regurgitation.       01/14/2017 Surgery    Port placement by Dr. Donne Hazel       01/15/2017 Imaging    Whole Body bone Scan 01/15/17 IMPRESSION: 1.  No significant abnormality identified.      01/15/2017 Imaging    CT CAP W Contrast 01/15/17 IMPRESSION: 1. Left lateral breast mass with pathologic left axillary adenopathy including a 2.0 cm left axillary lymph node. 2. Several hypodense lesions in the liver are likely benign lesions such as cysts. However, these are technically too small to characterize by CT. Possibilities for further workup include surveillance or hepatic protocol MRI with and without contrast. 3. There is also a tiny hypodense lesion in the spleen which is technically nonspecific but statistically highly likely to be benign. 4.  Prominent stool throughout the colon favors constipation      01/16/2017 -  Chemotherapy    Neoadjuvant chemo AC every 2 weeks for 4 cycles starting 01/16/17, then proceed with weekly taxol for 12 treatments, starting on 03/13/2017       Past Medical History:  Diagnosis Date  . [facility])    left breast cancer  . Dermatologic problem    possible psoriasis. Pt has not had been diagnosed by dermatology.  [facility] Kitchen GERD (gastroesophageal reflux disease)   . Tobacco dependence    trying to quit with nicotene patches    Past Surgical History:  Procedure Laterality  Date  . CESAREAN SECTION    . PORTACATH PLACEMENT N/A 01/14/2017   Procedure: INSERTION PORT-A-CATH WITH [place];  Surgeon: [name], MD;  Location: [facility];  Service: General;  Laterality: N/A;    Social History   Social History  . Marital status: Married    Spouse name: N/A  . Number of children: N/A  . Years of education: N/A   Social History Main Topics  . Smoking status: Former Smoker    Packs/day: 1.00    Years: 17.00    Types: Cigarettes    Quit date: 12/15/2016  . Smokeless tobacco: Never Used  . Alcohol use No  . Drug use: No  . Sexual activity: Yes    Birth control/ protection: None   Other Topics Concern  . Not on file   Social History Narrative  . No narrative on file     Family History:  During the visit, a 4-generation pedigree was obtained. Family tree will be scanned in the Media tab in Epic  Significant diagnoses include the following:  Family History  Problem Relation Age of Onset  . Stroke Maternal Grandmother   . Stroke Father   . Hypertension Father   .  Other Father        Adopted    Additionally, Ms. Dimattia has identical twin daughters (age 12). She has one brother (age 57) who has no children. Her mother (age 69) has 3 sisters and a brother (ages 56-59). Her father (age 54) has no information about his family as he was adopted.  Ms. Kagel ancestry is Caucasian - NOS. There is no known Jewish ancestry and no consanguinity.  Discussion: We reviewed the characteristics, features and inheritance patterns of hereditary cancer syndromes. We discussed her risk of harboring a mutation in the context of her personal and family history. We discussed that her unknown paternal family makes risk assessment challenging. We discussed the process of genetic testing, insurance coverage and implications of results: positive, negative and variant of unknown significance (VUS).    Ms. Lutter questions were answered to her satisfaction today and she is  welcome to call with any additional questions or concerns. Thank you for the referral and allowing [place] to share in the care of your patient.    Steele Berg, MS, [place] Certified Genetic Counselor phone: [phone] [email or web address]   ______________________________________________________________________ For Office Staff:  Number of people involved in session: 2 Was an Intern/ student involved with case: no

## 2017-03-12 NOTE — Progress Notes (Signed)
[facility]  [phone] [phone]  Clinic Follow-up Note   Patient Care Team: [name], MD as PCP - General (Internal Medicine) [name], MD as Consulting Physician (General Surgery) [name], MD as Consulting Physician (Hematology) [name], MD as Consulting Physician (Radiation Oncology) 03/13/2017  CHIEF COMPLAINTS:  Follow-up for Malignant neoplasm of upper-outer quadrant of left breast in female, estrogen receptor positive   Oncology History   Cancer Staging Malignant neoplasm of upper-outer quadrant of left breast in female, estrogen receptor positive ([place]) Staging form: Breast, AJCC 8th Edition - Clinical stage from 12/29/2016: Stage IIIA (cT2, cN1, cM0, G3, ER: Positive, PR: Negative, HER2: Negative) - Signed by [name], MD on 01/05/2017       Malignant neoplasm of upper-outer quadrant of left breast in female, estrogen receptor positive ([place])   12/25/2016 Mammogram    [place] and [facility] Diagnostic Breast Tomo Bilateral 12/25/16 IMPRESSION: 1. Suspicious mass 2.3 x 1.6 x 3.0 cm  in the 130 o'clock location of the left breast 8cm from the nipple, biopsy recommended. Adjacent simple cyst is 1.9 cm. 2. Suspicious left axillary lymph node 2.2cm for which biopsy is indicated. 3. Indeterminate group of calcifications in the upper central portion of the left breast for which biopsy is recommended.       12/29/2016 Initial Biopsy    Diagnosis 12/29/16 1. Breast, left, needle core biopsy, stereotactic, upper inner quadrant - FIBROCYSTIC CHANGES INCLUDING APOCRINE METAPLASIA WITH CALCIFICATIONS - NO MALIGNANCY IDENTIFIED 2. Breast, left, needle core biopsy, ultrasound, 1:30 o'clock - INVASIVE MAMMARY CARCINOMA, G3 - SEE COMMENT 3. Lymph node, needle/core biopsy, ultrasound, left axillary - METASTIC CARCINOMA INVOLVING ONE LYMPH NODE (1/1) E-cadherin is positive supporting a ductal origin.       12/29/2016 Receptors her2    ER  15%+, weak staining  PR - HER2- Ki67 10%       12/29/2016 Initial Diagnosis    Malignant neoplasm of upper-outer quadrant of left breast in female, estrogen receptor positive ([place])      01/13/2017 Echocardiogram    ECHO 01/13/17 Study Conclusions - Left ventricle: The cavity size was normal. Systolic function was   normal. The estimated ejection fraction was in the range of 60%   to 65%. Wall motion was normal; there were no regional wall   motion abnormalities. Left ventricular diastolic function   parameters were normal. - Mitral valve: There was trivial regurgitation. - Right atrium: The atrium was mildly dilated. - Tricuspid valve: There was trivial regurgitation.       01/14/2017 Surgery    Port placement by Dr. Donne Hazel       01/15/2017 Imaging    Whole Body bone Scan 01/15/17 IMPRESSION: 1.  No significant abnormality identified.      01/15/2017 Imaging    CT CAP W Contrast 01/15/17 IMPRESSION: 1. Left lateral breast mass with pathologic left axillary adenopathy including a 2.0 cm left axillary lymph node. 2. Several hypodense lesions in the liver are likely benign lesions such as cysts. However, these are technically too small to characterize by CT. Possibilities for further workup include surveillance or hepatic protocol MRI with and without contrast. 3. There is also a tiny hypodense lesion in the spleen which is technically nonspecific but statistically highly likely to be benign. 4.  Prominent stool throughout the colon favors constipation      01/16/2017 -  Chemotherapy    Neoadjuvant chemo AC every 2 weeks for 4 cycles starting on 01/16/2017 and ended  02/27/17. Then proceed with weekly taxol for 12 treatments starting 03/13/17         HISTORY OF PRESENTING ILLNESS: 01/07/17 Kathryn Stanley 41 y.o. female is here because of newly diagnosed Malignant neoplasm of upper-outer quadrant of left breast in female, estrogen receptor positive. She presents to the breast  clinic today with her common law husband. She felt the lump herself 3 months ago. She felt pain like "stabbing" first. She then had a mammogram 12/25/16 and subsequent biopsy   In the past she was using smoking match and quit smoking "cold [place]" that was 1 month ago.   Today she reports her breast pain still comes and goes. She has not noticed any new changes. 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 Lately has regular period. She has a weak cervix and her children were born premature. She is currently working at [employer] and works on her feet all the time, working 5 days a week.   GYN HISTORY  Menarchal: 10 LMP: 12/10/16 Contraceptive: No HRT: NA GP: G2P1 - twins    CURRENT THERAPY: Neoadjuvant chemo AC every 2 weeks for 4 cycles starting on 01/16/2017 and ended 02/27/17. Then proceed with weekly taxol for 12 treatments starting 03/13/17   INTERVAL HISTORY:  Kathryn Stanley is here for a follow-up and first cycle Taxol. She presents to the clinic with her husband today.  She notes she developed a cold 2 days ago with a dry cough and bad headache. She does have body aches and chills. She had a low grade fever at 99.4 F. She has normal BMs. She is concerned that since she has never had a flu shot before that she may get the flu with her vaccination she will receive. Due to her cold she will get the vaccination in 2-3 weeks so she can recover. She says she feels well enough today to proceed with chemotherapy.    MEDICAL HISTORY:  Past Medical History:  Diagnosis Date  . Cancer [facility])    left breast cancer  . Dermatologic problem    possible psoriasis. Pt has not had been diagnosed by dermatology.  Marland Kitchen GERD (gastroesophageal reflux disease)   . Tobacco dependence    trying to quit with nicotene patches    SURGICAL HISTORY: Past Surgical History:  Procedure Laterality Date  . CESAREAN SECTION    . PORTACATH  PLACEMENT N/A 01/14/2017   Procedure: INSERTION PORT-A-CATH WITH [place];  Surgeon: [name], MD;  Location: [facility];  Service: General;  Laterality: N/A;    SOCIAL HISTORY: Social History   Social History  . Marital status: Married    Spouse name: N/A  . Number of children: N/A  . Years of education: N/A   Occupational History  . Not on file.   Social History Main Topics  . Smoking status: Former Smoker    Packs/day: 1.00    Years: 17.00    Types: Cigarettes    Quit date: 12/15/2016  . Smokeless tobacco: Never Used  . Alcohol use No  . Drug use: No  . Sexual activity: Yes    Birth control/ protection: None   Other Topics Concern  . Not on file   Social History Narrative  . No narrative on file    FAMILY HISTORY: Family History  Problem Relation Age of Onset  . Stroke Maternal Grandmother   . Stroke Father   . Hypertension  Father   . Other Father        Adopted    ALLERGIES:  is allergic to amoxicillin and nicotine.  MEDICATIONS:  Current Outpatient Prescriptions  Medication Sig Dispense Refill  . famotidine (PEPCID) 10 MG tablet Take 10 mg by mouth daily as needed for heartburn or indigestion.    Marland Kitchen ibuprofen (ADVIL,MOTRIN) 200 MG tablet Take 200-400 mg by mouth daily as needed for headache or moderate pain.    Marland Kitchen lidocaine-prilocaine (EMLA) cream Apply to affected area once 30 g 3  . ondansetron (ZOFRAN) 8 MG tablet Take 1 tablet (8 mg total) by mouth 2 (two) times daily as needed. Start on the third day after chemotherapy. (Patient not taking: Reported on 02/27/2017) 30 tablet 1  . prochlorperazine (COMPAZINE) 10 MG tablet TAKE 1 TABLET EVERY 6 HOURS AS NEEDED FOR NAUSEA OR VOMITING (Patient not taking: Reported on 02/27/2017) 30 tablet 2  . traMADol (ULTRAM) 50 MG tablet Take 1 tablet (50 mg total) by mouth every 6 (six) hours as needed. (Patient not taking: Reported on 03/13/2017) 12 tablet 1   No current facility-administered medications for this visit.     Facility-Administered Medications Ordered in Other Visits  Medication Dose Route Frequency Provider Last Rate Last Dose  . 0.9 %  sodium chloride infusion   Intravenous Once [name], MD      . dexamethasone (DECADRON) injection 10 mg  10 mg Intravenous Once [name], MD      . diphenhydrAMINE (BENADRYL) injection 50 mg  50 mg Intravenous Once [name], MD      . famotidine (PEPCID) IVPB 20 mg premix  20 mg Intravenous Once [name], MD      . heparin lock flush 100 unit/mL  500 Units Intracatheter Once PRN [name], MD      . PACLitaxel (TAXOL) 156 mg in dextrose 5 % 250 mL chemo infusion (</= 68m/m2)  80 mg/m2 (Treatment Plan Recorded) Intravenous Once FTruitt Merle MD      . sodium chloride flush (NS) 0.9 % injection 10 mL  10 mL Intracatheter PRN FTruitt Merle MD       REVIEW OF SYSTEMS:   Constitutional: Denies abnormal night sweats.  (+) complete hair loss (+) fever, low grade (+) chills (+) headache Eyes: Denies blurriness of vision, double vision or watery eyes Ears, nose, mouth, throat, and face: Denies mucositis or sore throat Respiratory: Denies dyspnea or wheezes (+) Dry cough  Cardiovascular: Denies palpitation, chest discomfort or lower extremity swelling Gastrointestinal:  Denies nausea, heartburn.  Skin: Denies abnormal skin rashes Lymphatics: Denies new lymphadenopathy or easy bruising Neurological:Denies numbness, tingling or new weaknesses MSK: (+) Body aches  Breast: (+) occasional shooting pain in left breast, palpable mass in left breast, now smaller and softer Behavioral/Psych: Mood is stable, no new changes  All other systems were reviewed with the patient and are negative.  PHYSICAL EXAMINATION:  ECOG PERFORMANCE STATUS: 1  Vitals:   03/13/17 0852  BP: 115/79  Pulse: (!) 102  Resp: 18  Temp: 99.4 F (37.4 C)  SpO2: 99%   Filed Weights   03/13/17 0852  Weight: 186 lb 1.6 oz (84.4 kg)    GENERAL:alert, no distress and comfortable SKIN: skin color,  texture, turgor are normal, no rashes or significant lesions EYES: normal, conjunctiva are pink and non-injected, sclera clear OROPHARYNX:no exudate, no erythema and lips, buccal mucosa, and tongue normal  NECK: supple, thyroid normal size, non-tender, without nodularity LYMPH:  no palpable lymphadenopathy  in the cervical, axillary or inguinal LUNGS: clear to auscultation and percussion with normal breathing effort HEART: regular rate & rhythm and no murmurs and no lower extremity edema ABDOMEN:abdomen soft, non-tender and normal bowel sounds Musculoskeletal:no cyanosis of digits and no clubbing  PSYCH: alert & oriented x 3 with fluent speech NEURO: no focal motor/sensory deficits Breast: (+) previous 1.5 x 1 cm mass, now 1 cm mass in UOQ of left breast is softer than before. (+) no longer palpable lymph node in left axilary that was previously about 1 cm.  LABORATORY DATA:  I have reviewed the data as listed CBC Latest Ref Rng & Units 03/13/2017 02/27/2017 02/13/2017  WBC 3.9 - 10.3 10e3/uL 15.8(H) 17.2(H) 19.7(H)  Hemoglobin 11.6 - 15.9 g/dL 10.8(L) 11.9 12.3  Hematocrit 34.8 - 46.6 % 31.7(L) 34.4(L) 36.0  Platelets 145 - 400 10e3/uL 183 167 146    CMP Latest Ref Rng & Units 03/13/2017 02/27/2017 02/13/2017  Glucose 70 - 140 mg/dl 139 127 110  BUN 7.0 - 26.0 mg/dL 12.4 18.5 18.6  Creatinine 0.6 - 1.1 mg/dL 0.8 0.8 0.8  Sodium 136 - 145 mEq/L 138 139 140  Potassium 3.5 - 5.1 mEq/L 3.6 3.9 3.8  Chloride 101 - 111 mmol/L - - -  CO2 22 - 29 mEq/L _0 Calcium 8.4 - 10.4 mg/dL 9.3 9.2 9.2  Total Protein 6.4 - 8.3 g/dL 7.0 7.0 6.9  Total Bilirubin 0.20 - 1.20 mg/dL 0.31 0.24 <0.22  Alkaline Phos 40 - 150 U/L 107 106 102  AST 5 - 34 U/L _1 ALT 0 - 55 U/L _2 PATHOLOGY  Diagnosis 12/29/16 1. Breast, left, needle core biopsy, stereotactic, upper inner quadrant - FIBROCYSTIC CHANGES INCLUDING APOCRINE METAPLASIA WITH CALCIFICATIONS - NO MALIGNANCY IDENTIFIED 2.  Breast, left, needle core biopsy, ultrasound, 1:30 o'clock - INVASIVE MAMMARY CARCINOMA - SEE COMMENT 3. Lymph node, needle/core biopsy, ultrasound, left axillary - METASTIC CARCINOMA INVOLVING ONE LYMPH NODE (1/1) Microscopic Comment 2. The biopsy material shows an infiltrative proliferation of cells with large vesicular nuclei with conspicuous nucleoli, arranged linearly and in small clusters. There are scattered cells with marked pleomorphism and cytologic atypia. Based on the biopsy, the carcinoma appears Nottingham grade 3 of 3 and measures 0.9 cm in greatest linear extent. E-cadherin and prognostic markers (ER/PR/ki-67/HER2-FISH)are pending and will be reported in an addendum. Dr. Tresa Moore reviewed the case and agrees with the above diagnosis. This case was called to [facility] on December 30, 2016. Results: IMMUNOHISTOCHEMICAL AND MORPHOMETRIC ANALYSIS PERFORMED MANUALLY Estrogen Receptor: 15%, POSITIVE, WEAK STAINING INTENSITY Progesterone Receptor: 0%, NEGATIVE Proliferation Marker Ki67: 10% COMMENT: The negative hormone receptor study(ies) in this case has An internal positive control. Results: HER2 - NEGATIVE RATIO OF HER2/CEP17 SIGNALS 1.63 AVERAGE HER2 COPY NUMBER PER CELL 3.10   PROCEDURES  ECHO 01/13/17 Study Conclusions - Left ventricle: The cavity size was normal. Systolic function was   normal. The estimated ejection fraction was in the range of 60%   to 65%. Wall motion was normal; there were no regional wall   motion abnormalities. Left ventricular diastolic function   parameters were normal. - Mitral valve: There was trivial regurgitation. - Right atrium: The atrium was mildly dilated. - Tricuspid valve: There was trivial regurgitation.  RADIOGRAPHIC STUDIES: I have personally reviewed the radiological images as listed and agreed with the findings in the report. No results found. ASSESSMENT & PLAN:  Kathryn Stanley  is a 41 y.o. premenopausal  female with no significant past medical history, presented with a palpable left breast mass.  1. Malignant neoplasm of upper-outer quadrant of left breast in female, ductal carcinoma, cT2N1M0, Stage IIIa, ER weakly +, PR - , HER2 - , Grade 3 -We reviewed her mammogram and initial biopsy results in details with patient and her husband.  -Her tumor is weakly ER positive, PR negative, HER-2 negative, grade 3, she has positive lymph nodes, those are all high risks for recurrence. I recommend neoadjuvant or adjuvant chemotherapy to reduce her risk of recurrence after surgery. -We discussed that the benefit of neoadjuvant chemotherapy, to shrink the tumor and lymph nodes, and potentially she would be a candidate for lumpectomy and may not need lymph node dissection if she has good response. I have discussed this with her surgeon Dr. Donne Hazel today, both of [place] recommend her to consider neoadjuvant chemotherapy. She agrees.  -we reviewed her staging scan CT from 01/15/17 in person. A few hypodense lesion were seen in the liver, appear to be benign cysts. CT and bone scan otherwise negative for distant metastasis. 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 This was discussed with patient. -I recommended neoadjuvant chemotherapy with dose dense Adriamycin and Cytoxan (AC) every 2 weeks for 4 cycles, followed by weekly Taxol for 12 weeks. She agreed. -She completed AC treatment 01/16/17-02/27/17. She tolerated well other than bone pain and constipation. She has had good clinical responce to treatment as her tumor is slightly smaller and her lymph node is no longer palpable upon 03/13/17 breast exam.  -She starts her weekly taxol 03/13/17. -I discussed adding Carboplatin to her weekly taxol if her genetic testing is positive for BRCA1/2, will wait on her genetic results before considering it. I discussed the side effects and the benefits to helping further shrink her tumor. I explained this would be in  attempt to achieve complete response from chemotherapy. -she has had for symptoms for the past 2 days, no fever, overall feels fair, we'll proceed with weekly Taxol today. Labs reviewed and adequate to proceed with chemo today. She knows to contact us if she develops new concerning symptoms from taxol.  -F/u with Lacie on 10/2 and me in 12/6  2. Genetics -Due to her young age of diagnosis I suggest she get genetic testing. She agrees -referred to Genetics, She was seen on 03/05/17 and the results are pending  3. Financial Support -They are not insured and are attempting to get an orange card.  -They have met our financial support/social worker  4. Cold symptoms -developed week of 03/13/17 with low grade fever, body aches, chills, headache, and dry cough.  -Will hold flu vaccination until resolved.  -She would like to proceed with chemotherapy today as labs are adequate. -I highly suggested she contacts [place] if her fever reaches 100.4 F or above. I encouraged her to stay away from certain environments until this resolves.  -She is fine to take advil, tylenol     PLAN:  -Hold Flu vaccination -Labs reviewed, adequate to proceed with Taxol today and continue weekly -F/u with Lacie on 10/12 and me on 12/26 -Lab, flush, and taxol on 11/2 and 11/9    All questions were answered. The patient knows to call the clinic with any problems, questions or concerns. I spent 20 minutes counseling the patient face to face. The total time spent in the appointment was 25 minutes and more than 50% was on counseling.  This  document serves as a record of services personally performed by [name], MD. It was created on her behalf by Joslyn Devon, a trained medical scribe. The creation of this record is based on the scribe's personal observations and the provider's statements to them. This document has been checked and approved by the attending provider.     [name], MD 03/13/2017 10:42 AM

## 2017-03-13 ENCOUNTER — Other Ambulatory Visit (HOSPITAL_BASED_OUTPATIENT_CLINIC_OR_DEPARTMENT_OTHER): Payer: Medicaid Other

## 2017-03-13 ENCOUNTER — Telehealth: Payer: Self-pay | Admitting: Hematology

## 2017-03-13 ENCOUNTER — Ambulatory Visit (HOSPITAL_BASED_OUTPATIENT_CLINIC_OR_DEPARTMENT_OTHER): Payer: Medicaid Other | Admitting: Hematology

## 2017-03-13 ENCOUNTER — Encounter: Payer: Self-pay | Admitting: Hematology

## 2017-03-13 ENCOUNTER — Ambulatory Visit (HOSPITAL_BASED_OUTPATIENT_CLINIC_OR_DEPARTMENT_OTHER): Payer: Medicaid Other

## 2017-03-13 ENCOUNTER — Ambulatory Visit: Payer: Medicaid Other

## 2017-03-13 VITALS — BP 115/79 | HR 102 | Temp 99.4°F | Resp 18 | Ht 64.0 in | Wt 186.1 lb

## 2017-03-13 VITALS — BP 116/74 | HR 80 | Temp 99.0°F | Resp 18

## 2017-03-13 DIAGNOSIS — Z5111 Encounter for antineoplastic chemotherapy: Secondary | ICD-10-CM

## 2017-03-13 DIAGNOSIS — C50412 Malignant neoplasm of upper-outer quadrant of left female breast: Secondary | ICD-10-CM | POA: Diagnosis not present

## 2017-03-13 DIAGNOSIS — Z170 Estrogen receptor positive status [ER+]: Principal | ICD-10-CM

## 2017-03-13 DIAGNOSIS — J00 Acute nasopharyngitis [common cold]: Secondary | ICD-10-CM | POA: Diagnosis not present

## 2017-03-13 DIAGNOSIS — C773 Secondary and unspecified malignant neoplasm of axilla and upper limb lymph nodes: Secondary | ICD-10-CM

## 2017-03-13 LAB — COMPREHENSIVE METABOLIC PANEL
ALK PHOS: 107 U/L (ref 40–150)
ALT: 22 U/L (ref 0–55)
ANION GAP: 10 meq/L (ref 3–11)
AST: 15 U/L (ref 5–34)
Albumin: 3.6 g/dL (ref 3.5–5.0)
BILIRUBIN TOTAL: 0.31 mg/dL (ref 0.20–1.20)
BUN: 12.4 mg/dL (ref 7.0–26.0)
CALCIUM: 9.3 mg/dL (ref 8.4–10.4)
CO2: 24 meq/L (ref 22–29)
CREATININE: 0.8 mg/dL (ref 0.6–1.1)
Chloride: 104 mEq/L (ref 98–109)
Glucose: 139 mg/dl (ref 70–140)
Potassium: 3.6 mEq/L (ref 3.5–5.1)
Sodium: 138 mEq/L (ref 136–145)
TOTAL PROTEIN: 7 g/dL (ref 6.4–8.3)

## 2017-03-13 LAB — CBC WITH DIFFERENTIAL/PLATELET
BASO%: 0.7 % (ref 0.0–2.0)
Basophils Absolute: 0.1 10*3/uL (ref 0.0–0.1)
EOS ABS: 0.1 10*3/uL (ref 0.0–0.5)
EOS%: 0.5 % (ref 0.0–7.0)
HEMATOCRIT: 31.7 % — AB (ref 34.8–46.6)
HGB: 10.8 g/dL — ABNORMAL LOW (ref 11.6–15.9)
LYMPH#: 0.8 10*3/uL — AB (ref 0.9–3.3)
LYMPH%: 5.4 % — AB (ref 14.0–49.7)
MCH: 29.4 pg (ref 25.1–34.0)
MCHC: 34 g/dL (ref 31.5–36.0)
MCV: 86.5 fL (ref 79.5–101.0)
MONO#: 0.9 10*3/uL (ref 0.1–0.9)
MONO%: 5.7 % (ref 0.0–14.0)
NEUT%: 87.7 % — AB (ref 38.4–76.8)
NEUTROS ABS: 13.8 10*3/uL — AB (ref 1.5–6.5)
PLATELETS: 183 10*3/uL (ref 145–400)
RBC: 3.66 10*6/uL — AB (ref 3.70–5.45)
RDW: 16.6 % — ABNORMAL HIGH (ref 11.2–14.5)
WBC: 15.8 10*3/uL — ABNORMAL HIGH (ref 3.9–10.3)

## 2017-03-13 MED ORDER — HEPARIN SOD (PORK) LOCK FLUSH 100 UNIT/ML IV SOLN
500.0000 [IU] | Freq: Once | INTRAVENOUS | Status: AC | PRN
  Administered 2017-03-13: 500 [IU]
  Filled 2017-03-13: qty 5

## 2017-03-13 MED ORDER — DIPHENHYDRAMINE HCL 50 MG/ML IJ SOLN
INTRAMUSCULAR | Status: AC
  Filled 2017-03-13: qty 1

## 2017-03-13 MED ORDER — SODIUM CHLORIDE 0.9 % IV SOLN
Freq: Once | INTRAVENOUS | Status: AC
  Administered 2017-03-13: 11:00:00 via INTRAVENOUS

## 2017-03-13 MED ORDER — DEXAMETHASONE SODIUM PHOSPHATE 10 MG/ML IJ SOLN
10.0000 mg | Freq: Once | INTRAMUSCULAR | Status: AC
  Administered 2017-03-13: 10 mg via INTRAVENOUS

## 2017-03-13 MED ORDER — FAMOTIDINE IN NACL 20-0.9 MG/50ML-% IV SOLN
INTRAVENOUS | Status: AC
  Filled 2017-03-13: qty 50

## 2017-03-13 MED ORDER — SODIUM CHLORIDE 0.9% FLUSH
10.0000 mL | INTRAVENOUS | Status: DC | PRN
  Administered 2017-03-13: 10 mL
  Filled 2017-03-13: qty 10

## 2017-03-13 MED ORDER — FAMOTIDINE IN NACL 20-0.9 MG/50ML-% IV SOLN
20.0000 mg | Freq: Once | INTRAVENOUS | Status: AC
  Administered 2017-03-13: 20 mg via INTRAVENOUS

## 2017-03-13 MED ORDER — PACLITAXEL CHEMO INJECTION 300 MG/50ML
80.0000 mg/m2 | Freq: Once | INTRAVENOUS | Status: AC
  Administered 2017-03-13: 156 mg via INTRAVENOUS
  Filled 2017-03-13: qty 26

## 2017-03-13 MED ORDER — DEXAMETHASONE SODIUM PHOSPHATE 10 MG/ML IJ SOLN
INTRAMUSCULAR | Status: AC
  Filled 2017-03-13: qty 1

## 2017-03-13 MED ORDER — SODIUM CHLORIDE 0.9 % IV SOLN: 10.0000 mg | Freq: Once | INTRAVENOUS | Status: DC

## 2017-03-13 MED ORDER — DIPHENHYDRAMINE HCL 50 MG/ML IJ SOLN
50.0000 mg | Freq: Once | INTRAMUSCULAR | Status: AC
  Administered 2017-03-13: 50 mg via INTRAVENOUS

## 2017-03-13 NOTE — Patient Instructions (Signed)
Implanted Port Home Guide An implanted port is a type of central line that is placed under the skin. Central lines are used to provide IV access when treatment or nutrition needs to be given through a person's veins. Implanted ports are used for long-term IV access. An implanted port may be placed because:  You need IV medicine that would be irritating to the small veins in your hands or arms.  You need long-term IV medicines, such as antibiotics.  You need IV nutrition for a long period.  You need frequent blood draws for lab tests.  You need dialysis.  Implanted ports are usually placed in the chest area, but they can also be placed in the upper arm, the abdomen, or the leg. An implanted port has two main parts:  Reservoir. The reservoir is round and will appear as a small, raised area under your skin. The reservoir is the part where a needle is inserted to give medicines or draw blood.  Catheter. The catheter is a thin, flexible tube that extends from the reservoir. The catheter is placed into a large vein. Medicine that is inserted into the reservoir goes into the catheter and then into the vein.  How will I care for my incision site? Do not get the incision site wet. Bathe or shower as directed by your health care provider. How is my port accessed? Special steps must be taken to access the port:  Before the port is accessed, a numbing cream can be placed on the skin. This helps numb the skin over the port site.  Your health care provider uses a sterile technique to access the port. ? Your health care provider must put on a mask and sterile gloves. ? The skin over your port is cleaned carefully with an antiseptic and allowed to dry. ? The port is gently pinched between sterile gloves, and a needle is inserted into the port.  Only "non-coring" port needles should be used to access the port. Once the port is accessed, a blood return should be checked. This helps ensure that the port  is in the vein and is not clogged.  If your port needs to remain accessed for a constant infusion, a clear (transparent) bandage will be placed over the needle site. The bandage and needle will need to be changed every week, or as directed by your health care provider.  Keep the bandage covering the needle clean and dry. Do not get it wet. Follow your health care provider's instructions on how to take a shower or bath while the port is accessed.  If your port does not need to stay accessed, no bandage is needed over the port.  What is flushing? Flushing helps keep the port from getting clogged. Follow your health care provider's instructions on how and when to flush the port. Ports are usually flushed with saline solution or a medicine called heparin. The need for flushing will depend on how the port is used.  If the port is used for intermittent medicines or blood draws, the port will need to be flushed: ? After medicines have been given. ? After blood has been drawn. ? As part of routine maintenance.  If a constant infusion is running, the port may not need to be flushed.  How long will my port stay implanted? The port can stay in for as long as your health care provider thinks it is needed. When it is time for the port to come out, surgery will be   done to remove it. The procedure is similar to the one performed when the port was put in. When should I seek immediate medical care? When you have an implanted port, you should seek immediate medical care if:  You notice a bad smell coming from the incision site.  You have swelling, redness, or drainage at the incision site.  You have more swelling or pain at the port site or the surrounding area.  You have a fever that is not controlled with medicine.  This information is not intended to replace advice given to you by your health care provider. Make sure you discuss any questions you have with your health care provider. Document  Released: 05/26/2005 Document Revised: 11/01/2015 Document Reviewed: 01/31/2013 Elsevier Interactive Patient Education  2017 [employer]  

## 2017-03-13 NOTE — Telephone Encounter (Signed)
Scheduled appt per 10/5 los - patient to get new schedule in the treatment area.

## 2017-03-13 NOTE — Patient Instructions (Signed)
[facility] Discharge Instructions for Patients Receiving Chemotherapy  Today you received the following chemotherapy agents Paclitaxel.   To help prevent nausea and vomiting after your treatment, we encourage you to take your nausea medication as prescribed.   If you develop nausea and vomiting that is not controlled by your nausea medication, call the clinic.   BELOW ARE SYMPTOMS THAT SHOULD BE REPORTED IMMEDIATELY:  *FEVER GREATER THAN 100.5 F  *CHILLS WITH OR WITHOUT FEVER  NAUSEA AND VOMITING THAT IS NOT CONTROLLED WITH YOUR NAUSEA MEDICATION  *UNUSUAL SHORTNESS OF BREATH  *UNUSUAL BRUISING OR BLEEDING  TENDERNESS IN MOUTH AND THROAT WITH OR WITHOUT PRESENCE OF ULCERS  *URINARY PROBLEMS  *BOWEL PROBLEMS  UNUSUAL RASH Items with * indicate a potential emergency and should be followed up as soon as possible.  Feel free to call the clinic should you have any questions or concerns. The clinic phone number is [phone].  Please show the [facility] at check-in to the Emergency Department and triage nurse.   Paclitaxel injection What is this medicine? PACLITAXEL (PAK li TAX el) is a chemotherapy drug. It targets fast dividing cells, like cancer cells, and causes these cells to die. This medicine is used to treat ovarian cancer, breast cancer, and other cancers. This medicine may be used for other purposes; ask your health care provider or pharmacist if you have questions. COMMON BRAND NAME(S): Onxol, Taxol What should I tell my health care provider before I take this medicine? They need to know if you have any of these conditions: -blood disorders -irregular heartbeat -infection (especially a virus infection such as chickenpox, cold sores, or herpes) -liver disease -previous or ongoing radiation therapy -an unusual or allergic reaction to paclitaxel, alcohol, polyoxyethylated castor oil, other chemotherapy agents, other medicines, foods, dyes, or  preservatives -pregnant or trying to get pregnant -breast-feeding How should I use this medicine? This drug is given as an infusion into a vein. It is administered in a hospital or clinic by a specially trained health care professional. Talk to your pediatrician regarding the use of this medicine in children. Special care may be needed. Overdosage: If you think you have taken too much of this medicine contact a poison control center or emergency room at once. NOTE: This medicine is only for you. Do not share this medicine with others. What if I miss a dose? It is important not to miss your dose. Call your doctor or health care professional if you are unable to keep an appointment. What may interact with this medicine? Do not take this medicine with any of the following medications: -disulfiram -metronidazole This medicine may also interact with the following medications: -cyclosporine -diazepam -ketoconazole -medicines to increase blood counts like filgrastim, pegfilgrastim, sargramostim -other chemotherapy drugs like cisplatin, doxorubicin, epirubicin, etoposide, teniposide, vincristine -quinidine -testosterone -vaccines -verapamil Talk to your doctor or health care professional before taking any of these medicines: -acetaminophen -aspirin -ibuprofen -ketoprofen -naproxen This list may not describe all possible interactions. Give your health care provider a list of all the medicines, herbs, non-prescription drugs, or dietary supplements you use. Also tell them if you smoke, drink alcohol, or use illegal drugs. Some items may interact with your medicine. What should I watch for while using this medicine? Your condition will be monitored carefully while you are receiving this medicine. You will need important blood work done while you are taking this medicine. This medicine can cause serious allergic reactions. To reduce your risk you will need  to take other medicine(s) before  treatment with this medicine. If you experience allergic reactions like skin rash, itching or hives, swelling of the face, lips, or tongue, tell your doctor or health care professional right away. In some cases, you may be given additional medicines to help with side effects. Follow all directions for their use. This drug may make you feel generally unwell. This is not uncommon, as chemotherapy can affect healthy cells as well as cancer cells. Report any side effects. Continue your course of treatment even though you feel ill unless your doctor tells you to stop. Call your doctor or health care professional for advice if you get a fever, chills or sore throat, or other symptoms of a cold or flu. Do not treat yourself. This drug decreases your body's ability to fight infections. Try to avoid being around people who are sick. This medicine may increase your risk to bruise or bleed. Call your doctor or health care professional if you notice any unusual bleeding. Be careful brushing and flossing your teeth or using a toothpick because you may get an infection or bleed more easily. If you have any dental work done, tell your dentist you are receiving this medicine. Avoid taking products that contain aspirin, acetaminophen, ibuprofen, naproxen, or ketoprofen unless instructed by your doctor. These medicines may hide a fever. Do not become pregnant while taking this medicine. Women should inform their doctor if they wish to become pregnant or think they might be pregnant. There is a potential for serious side effects to an unborn child. Talk to your health care professional or pharmacist for more information. Do not breast-feed an infant while taking this medicine. Men are advised not to father a child while receiving this medicine. This product may contain alcohol. Ask your pharmacist or healthcare provider if this medicine contains alcohol. Be sure to tell all healthcare providers you are taking this medicine.  Certain medicines, like metronidazole and disulfiram, can cause an unpleasant reaction when taken with alcohol. The reaction includes flushing, headache, nausea, vomiting, sweating, and increased thirst. The reaction can last from 30 minutes to several hours. What side effects may I notice from receiving this medicine? Side effects that you should report to your doctor or health care professional as soon as possible: -allergic reactions like skin rash, itching or hives, swelling of the face, lips, or tongue -low blood counts - This drug may decrease the number of white blood cells, red blood cells and platelets. You may be at increased risk for infections and bleeding. -signs of infection - fever or chills, cough, sore throat, pain or difficulty passing urine -signs of decreased platelets or bleeding - bruising, pinpoint red spots on the skin, black, tarry stools, nosebleeds -signs of decreased red blood cells - unusually weak or tired, fainting spells, lightheadedness -breathing problems -chest pain -high or low blood pressure -mouth sores -nausea and vomiting -pain, swelling, redness or irritation at the injection site -pain, tingling, numbness in the hands or feet -slow or irregular heartbeat -swelling of the ankle, feet, hands Side effects that usually do not require medical attention (report to your doctor or health care professional if they continue or are bothersome): -bone pain -complete hair loss including hair on your head, underarms, pubic hair, eyebrows, and eyelashes -changes in the color of fingernails -diarrhea -loosening of the fingernails -loss of appetite -muscle or joint pain -red flush to skin -sweating This list may not describe all possible side effects. Call your doctor for medical advice  about side effects. You may report side effects to FDA at 1-800-FDA-1088. Where should I keep my medicine? This drug is given in a hospital or clinic and will not be stored at  home. NOTE: This sheet is a summary. It may not cover all possible information. If you have questions about this medicine, talk to your doctor, pharmacist, or health care provider.  2018 Elsevier/Gold Standard (2015-03-27 19:58:00)

## 2017-03-20 ENCOUNTER — Ambulatory Visit: Payer: Medicaid Other

## 2017-03-20 ENCOUNTER — Ambulatory Visit (HOSPITAL_BASED_OUTPATIENT_CLINIC_OR_DEPARTMENT_OTHER): Payer: Medicaid Other

## 2017-03-20 ENCOUNTER — Ambulatory Visit (HOSPITAL_BASED_OUTPATIENT_CLINIC_OR_DEPARTMENT_OTHER): Payer: Medicaid Other | Admitting: Nurse Practitioner

## 2017-03-20 ENCOUNTER — Other Ambulatory Visit (HOSPITAL_BASED_OUTPATIENT_CLINIC_OR_DEPARTMENT_OTHER): Payer: Medicaid Other

## 2017-03-20 ENCOUNTER — Encounter: Payer: Self-pay | Admitting: Nurse Practitioner

## 2017-03-20 ENCOUNTER — Other Ambulatory Visit: Payer: Medicaid Other

## 2017-03-20 VITALS — BP 125/74 | HR 95 | Temp 97.9°F | Resp 20 | Ht 64.0 in | Wt 185.9 lb

## 2017-03-20 VITALS — BP 115/70 | HR 81 | Temp 98.5°F | Resp 20 | Ht 64.0 in | Wt 194.2 lb

## 2017-03-20 DIAGNOSIS — Z95828 Presence of other vascular implants and grafts: Secondary | ICD-10-CM

## 2017-03-20 DIAGNOSIS — Z170 Estrogen receptor positive status [ER+]: Secondary | ICD-10-CM | POA: Diagnosis not present

## 2017-03-20 DIAGNOSIS — R05 Cough: Secondary | ICD-10-CM

## 2017-03-20 DIAGNOSIS — R509 Fever, unspecified: Secondary | ICD-10-CM

## 2017-03-20 DIAGNOSIS — Z5111 Encounter for antineoplastic chemotherapy: Secondary | ICD-10-CM | POA: Diagnosis present

## 2017-03-20 DIAGNOSIS — C50412 Malignant neoplasm of upper-outer quadrant of left female breast: Secondary | ICD-10-CM

## 2017-03-20 DIAGNOSIS — C773 Secondary and unspecified malignant neoplasm of axilla and upper limb lymph nodes: Secondary | ICD-10-CM

## 2017-03-20 LAB — COMPREHENSIVE METABOLIC PANEL
ALBUMIN: 3.5 g/dL (ref 3.5–5.0)
ALT: 55 U/L (ref 0–55)
AST: 28 U/L (ref 5–34)
Alkaline Phosphatase: 84 U/L (ref 40–150)
Anion Gap: 9 mEq/L (ref 3–11)
BILIRUBIN TOTAL: 0.4 mg/dL (ref 0.20–1.20)
BUN: 13.5 mg/dL (ref 7.0–26.0)
CO2: 23 meq/L (ref 22–29)
Calcium: 9.1 mg/dL (ref 8.4–10.4)
Chloride: 107 mEq/L (ref 98–109)
Creatinine: 0.8 mg/dL (ref 0.6–1.1)
GLUCOSE: 83 mg/dL (ref 70–140)
POTASSIUM: 4 meq/L (ref 3.5–5.1)
SODIUM: 139 meq/L (ref 136–145)
TOTAL PROTEIN: 6.7 g/dL (ref 6.4–8.3)

## 2017-03-20 LAB — CBC WITH DIFFERENTIAL/PLATELET
BASO%: 1.9 % (ref 0.0–2.0)
BASOS ABS: 0.1 10*3/uL (ref 0.0–0.1)
EOS%: 1.8 % (ref 0.0–7.0)
Eosinophils Absolute: 0.1 10*3/uL (ref 0.0–0.5)
HCT: 29.8 % — ABNORMAL LOW (ref 34.8–46.6)
HGB: 10.4 g/dL — ABNORMAL LOW (ref 11.6–15.9)
LYMPH%: 13.2 % — ABNORMAL LOW (ref 14.0–49.7)
MCH: 30 pg (ref 25.1–34.0)
MCHC: 34.8 g/dL (ref 31.5–36.0)
MCV: 86.1 fL (ref 79.5–101.0)
MONO#: 0.8 10*3/uL (ref 0.1–0.9)
MONO%: 13 % (ref 0.0–14.0)
NEUT#: 4.4 10*3/uL (ref 1.5–6.5)
NEUT%: 70.1 % (ref 38.4–76.8)
Platelets: 346 10*3/uL (ref 145–400)
RBC: 3.46 10*6/uL — AB (ref 3.70–5.45)
RDW: 16.6 % — AB (ref 11.2–14.5)
WBC: 6.3 10*3/uL (ref 3.9–10.3)
lymph#: 0.8 10*3/uL — ABNORMAL LOW (ref 0.9–3.3)

## 2017-03-20 MED ORDER — DIPHENHYDRAMINE HCL 50 MG/ML IJ SOLN
50.0000 mg | Freq: Once | INTRAMUSCULAR | Status: AC
  Administered 2017-03-20: 50 mg via INTRAVENOUS

## 2017-03-20 MED ORDER — DEXAMETHASONE SODIUM PHOSPHATE 10 MG/ML IJ SOLN
INTRAMUSCULAR | Status: AC
  Filled 2017-03-20: qty 1

## 2017-03-20 MED ORDER — SODIUM CHLORIDE 0.9% FLUSH
10.0000 mL | INTRAVENOUS | Status: DC | PRN
  Administered 2017-03-20: 10 mL
  Filled 2017-03-20: qty 10

## 2017-03-20 MED ORDER — SODIUM CHLORIDE 0.9 % IV SOLN
80.0000 mg/m2 | Freq: Once | INTRAVENOUS | Status: AC
  Administered 2017-03-20: 156 mg via INTRAVENOUS
  Filled 2017-03-20: qty 26

## 2017-03-20 MED ORDER — FAMOTIDINE IN NACL 20-0.9 MG/50ML-% IV SOLN
20.0000 mg | Freq: Once | INTRAVENOUS | Status: AC
  Administered 2017-03-20: 20 mg via INTRAVENOUS

## 2017-03-20 MED ORDER — DIPHENHYDRAMINE HCL 50 MG/ML IJ SOLN
INTRAMUSCULAR | Status: AC
  Filled 2017-03-20: qty 1

## 2017-03-20 MED ORDER — SODIUM CHLORIDE 0.9% FLUSH
10.0000 mL | Freq: Once | INTRAVENOUS | Status: AC
  Administered 2017-03-20: 10 mL via INTRAVENOUS
  Filled 2017-03-20: qty 10

## 2017-03-20 MED ORDER — SODIUM CHLORIDE 0.9 % IV SOLN
Freq: Once | INTRAVENOUS | Status: AC
  Administered 2017-03-20: 13:00:00 via INTRAVENOUS

## 2017-03-20 MED ORDER — DEXAMETHASONE SODIUM PHOSPHATE 10 MG/ML IJ SOLN
10.0000 mg | Freq: Once | INTRAMUSCULAR | Status: AC
  Administered 2017-03-20: 10 mg via INTRAVENOUS

## 2017-03-20 MED ORDER — FAMOTIDINE IN NACL 20-0.9 MG/50ML-% IV SOLN
INTRAVENOUS | Status: AC
  Filled 2017-03-20: qty 50

## 2017-03-20 MED ORDER — HEPARIN SOD (PORK) LOCK FLUSH 100 UNIT/ML IV SOLN
500.0000 [IU] | Freq: Once | INTRAVENOUS | Status: AC | PRN
  Administered 2017-03-20: 500 [IU]
  Filled 2017-03-20: qty 5

## 2017-03-20 NOTE — Progress Notes (Signed)
[facility]  [phone] Fax:([phone]) [phone]  Clinic Follow up Note   Patient Care Team: [name], MD as PCP - General (Internal Medicine) [name], MD as Consulting Physician (General Surgery) [name], MD as Consulting Physician (Hematology) [name], MD as Consulting Physician (Radiation Oncology) 03/20/2017  SUMMARY OF ONCOLOGIC HISTORY: Oncology History   Cancer Staging Malignant neoplasm of upper-outer quadrant of left breast in female, estrogen receptor positive ([place]) Staging form: Breast, AJCC 8th Edition - Clinical stage from 12/29/2016: Stage IIIA (cT2, cN1, cM0, G3, ER: Positive, PR: Negative, HER2: Negative) - Signed by [name], MD on 01/05/2017       Malignant neoplasm of upper-outer quadrant of left breast in female, estrogen receptor positive ([place])   12/25/2016 Mammogram    [place] and [facility] Diagnostic Breast Tomo Bilateral 12/25/16 IMPRESSION: 1. Suspicious mass 2.3 x 1.6 x 3.0 cm  in the 130 o'clock location of the left breast 8cm from the nipple, biopsy recommended. Adjacent simple cyst is 1.9 cm. 2. Suspicious left axillary lymph node 2.2cm for which biopsy is indicated. 3. Indeterminate group of calcifications in the upper central portion of the left breast for which biopsy is recommended.       12/29/2016 Initial Biopsy    Diagnosis 12/29/16 1. Breast, left, needle core biopsy, stereotactic, upper inner quadrant - FIBROCYSTIC CHANGES INCLUDING APOCRINE METAPLASIA WITH CALCIFICATIONS - NO MALIGNANCY IDENTIFIED 2. Breast, left, needle core biopsy, ultrasound, 1:30 o'clock - INVASIVE MAMMARY CARCINOMA, G3 - SEE COMMENT 3. Lymph node, needle/core biopsy, ultrasound, left axillary - METASTIC CARCINOMA INVOLVING ONE LYMPH NODE (1/1) E-cadherin is positive supporting a ductal origin.       12/29/2016 Receptors her2    ER 15%+, weak staining  PR - HER2- Ki67 10%       12/29/2016 Initial Diagnosis    Malignant  neoplasm of upper-outer quadrant of left breast in female, estrogen receptor positive ([place])      01/13/2017 Echocardiogram    ECHO 01/13/17 Study Conclusions - Left ventricle: The cavity size was normal. Systolic function was   normal. The estimated ejection fraction was in the range of 60%   to 65%. Wall motion was normal; there were no regional wall   motion abnormalities. Left ventricular diastolic function   parameters were normal. - Mitral valve: There was trivial regurgitation. - Right atrium: The atrium was mildly dilated. - Tricuspid valve: There was trivial regurgitation.       01/14/2017 Surgery    Port placement by Dr. Donne Hazel       01/15/2017 Imaging    Whole Body bone Scan 01/15/17 IMPRESSION: 1.  No significant abnormality identified.      01/15/2017 Imaging    CT CAP W Contrast 01/15/17 IMPRESSION: 1. Left lateral breast mass with pathologic left axillary adenopathy including a 2.0 cm left axillary lymph node. 2. Several hypodense lesions in the liver are likely benign lesions such as cysts. However, these are technically too small to characterize by CT. Possibilities for further workup include surveillance or hepatic protocol MRI with and without contrast. 3. There is also a tiny hypodense lesion in the spleen which is technically nonspecific but statistically highly likely to be benign. 4.  Prominent stool throughout the colon favors constipation      01/16/2017 -  Chemotherapy    Neoadjuvant chemo AC every 2 weeks for 4 cycles starting on 01/16/2017 and ended 02/27/17. Then proceed with weekly taxol for 12 treatments starting 03/13/17  CURRENT THERAPY: Neoadjuvant chemo AC every 2 weeks for 4 cycles starting on 01/16/2017 and ended 02/27/17. Then proceed with weekly taxol for 12 treatments starting 03/13/17   INTERVAL HISTORY: Mrs. Kloeppel returns for follow-up as scheduled. She received her first weekly dose of Taxol on 03/13/2017. She had mild fatigue for 2  days after treatment, now resolved. She continues to note a dry cough, worse in the evening, with clear rhinorrhea and low-grade temp at 99 oral. He denies chest pain, shortness of breath, chills. She has not tried OTC cold medicine yet. She has a good appetite and is able to drink adequate liquids. She denies nausea, vomiting, constipation, diarrhea. She had some stinging with port access last week with chlorhexidine. Much improved with Betadine today. Her UTI symptoms have resolved.  REVIEW OF SYSTEMS:   Constitutional: Denies fatigue, chills or abnormal weight loss (+) low-grade temp at home, 99 oral Eyes: Denies blurriness of vision Ears, nose, mouth, throat, and face: Denies mucositis or sore throat Respiratory: Denies dyspnea or wheezes (+) dry cough, worse in the evening Cardiovascular: Denies palpitation, chest discomfort or lower extremity swelling (+) intermittent fast heart rate Gastrointestinal:  Denies nausea, vomiting, constipation, diarrhea,heartburn or change in bowel habits. No blood in stool Skin: Denies abnormal skin rashes MSK: (+) intermittent body aches Lymphatics: Denies new lymphadenopathy or easy bruising Neurological:Denies numbness, tingling or new weaknesses Behavioral/Psych: Mood is stable, no new changes  All other systems were reviewed with the patient and are negative.  MEDICAL HISTORY:  Past Medical History:  Diagnosis Date  . Cancer [facility])    left breast cancer  . Dermatologic problem    possible psoriasis. Pt has not had been diagnosed by dermatology.  Marland Kitchen GERD (gastroesophageal reflux disease)   . Tobacco dependence    trying to quit with nicotene patches    SURGICAL HISTORY: Past Surgical History:  Procedure Laterality Date  . CESAREAN SECTION    . PORTACATH PLACEMENT N/A 01/14/2017   Procedure: INSERTION PORT-A-CATH WITH [place];  Surgeon: [name], MD;  Location: [facility];  Service: General;  Laterality: N/A;    I have reviewed the social  history and family history with the patient and they are unchanged from previous note.  ALLERGIES:  is allergic to amoxicillin and nicotine.  MEDICATIONS:  Current Outpatient Prescriptions  Medication Sig Dispense Refill  . ibuprofen (ADVIL,MOTRIN) 200 MG tablet Take 200-400 mg by mouth daily as needed for headache or moderate pain.    Marland Kitchen lidocaine-prilocaine (EMLA) cream Apply to affected area once 30 g 3  . famotidine (PEPCID) 10 MG tablet Take 10 mg by mouth daily as needed for heartburn or indigestion.    . ondansetron (ZOFRAN) 8 MG tablet Take 1 tablet (8 mg total) by mouth 2 (two) times daily as needed. Start on the third day after chemotherapy. (Patient not taking: Reported on 02/27/2017) 30 tablet 1  . prochlorperazine (COMPAZINE) 10 MG tablet TAKE 1 TABLET EVERY 6 HOURS AS NEEDED FOR NAUSEA OR VOMITING (Patient not taking: Reported on 02/27/2017) 30 tablet 2  . traMADol (ULTRAM) 50 MG tablet Take 1 tablet (50 mg total) by mouth every 6 (six) hours as needed. (Patient not taking: Reported on 03/13/2017) 12 tablet 1   No current facility-administered medications for this visit.    Facility-Administered Medications Ordered in Other Visits  Medication Dose Route Frequency Provider Last Rate Last Dose  . 0.9 %  sodium chloride infusion   Intravenous Once [name], MD      .  dexamethasone (DECADRON) injection 10 mg  10 mg Intravenous Once [name], MD      . diphenhydrAMINE (BENADRYL) injection 50 mg  50 mg Intravenous Once [name], MD      . famotidine (PEPCID) IVPB 20 mg premix  20 mg Intravenous Once [name], MD      . heparin lock flush 100 unit/mL  500 Units Intracatheter Once PRN [name], MD      . PACLitaxel (TAXOL) 156 mg in dextrose 5 % 250 mL chemo infusion (</= 71m/m2)  80 mg/m2 (Treatment Plan Recorded) Intravenous Once FTruitt Merle MD      . sodium chloride flush (NS) 0.9 % injection 10 mL  10 mL Intracatheter PRN FTruitt Merle MD        PHYSICAL EXAMINATION: ECOG  PERFORMANCE STATUS: 1 - Symptomatic but completely ambulatory  Vitals:   03/20/17 1322  BP: 115/70  Pulse: 81  Resp: 20  Temp: 98.5 F (36.9 C)  SpO2: 97%   Filed Weights   03/20/17 1322  Weight: 194 lb 3.2 oz (88.1 kg)    GENERAL:alert, no distress and comfortable SKIN: skin color, texture, turgor are normal, no rashes or significant lesions. (+) [facility], minimal erythema, no drainage. EYES: normal, Conjunctiva are pink and non-injected, sclera clear OROPHARYNX:no exudate, no erythema and lips, buccal mucosa, and tongue normal  NECK: supple, thyroid normal size, non-tender, without nodularity LYMPH:  no palpable cervical, supraclavicular or axillary lymphadenopathy  LUNGS: clear to auscultation bilaterally with normal breathing effort HEART: regular rate & rhythm, S1 and S2 present, no murmurs and no lower extremity edema ABDOMEN:abdomen soft, non-tender and normal bowel sounds. No palpable hepatomegaly or masses Musculoskeletal:no cyanosis of digits and no clubbing  NEURO: alert & oriented x 3 with fluent speech, no focal motor/sensory deficits Breast:soft 1 cm palpable mass in UOQ of left breast; no palpable axillarylymph node. Right breast is benign  LABORATORY DATA:  I have reviewed the data as listed CBC Latest Ref Rng & Units 03/20/2017 03/13/2017 02/27/2017  WBC 3.9 - 10.3 10e3/uL 6.3 15.8(H) 17.2(H)  Hemoglobin 11.6 - 15.9 g/dL 10.4(L) 10.8(L) 11.9  Hematocrit 34.8 - 46.6 % 29.8(L) 31.7(L) 34.4(L)  Platelets 145 - 400 10e3/uL 346 183 167     CMP Latest Ref Rng & Units 03/20/2017 03/13/2017 02/27/2017  Glucose 70 - 140 mg/dl 83 139 127  BUN 7.0 - 26.0 mg/dL 13.5 12.4 18.5  Creatinine 0.6 - 1.1 mg/dL 0.8 0.8 0.8  Sodium 136 - 145 mEq/L 139 138 139  Potassium 3.5 - 5.1 mEq/L 4.0 3.6 3.9  Chloride 101 - 111 mmol/L - - -  CO2 22 - 29 mEq/L _0 Calcium 8.4 - 10.4 mg/dL 9.1 9.3 9.2  Total Protein 6.4 - 8.3 g/dL 6.7 7.0 7.0  Total Bilirubin  0.20 - 1.20 mg/dL 0.40 0.31 0.24  Alkaline Phos 40 - 150 U/L 84 107 106  AST 5 - 34 U/L _1 ALT 0 - 55 U/L 55 22 23    RADIOGRAPHIC STUDIES: I have personally reviewed the radiological images as listed and agreed with the findings in the report. No results found.   ASSESSMENT & PLAN: JVETTA COUZENSis a 41y.o. premenopausal female with no significant past medical history, presented with a palpable left breast mass.  1. Malignant neoplasm of upper-outer quadrant of left breast in female, ductal carcinoma, cT2N1M0, Stage IIIa, ER weakly +, PR - , HER2 - , Grade  3 2. Genetics 3. Financial Support 4. Cold symptoms  Ms. Privette tolerated her first cycle weekly Taxol without difficulty. She continues to have mild cold symptoms including dry cough, clear rhinorrhea, low-grade temp 99 oral at home. She is afebrile today, VS stable. She feels well otherwise and feels well enough to proceed with cycle 2 today.w e will defer flu shot until cold symptom recovery. CBC and Cmet are unremarkable. Her left upper outer quadrant breast mass remains palpable at 1 cm, which is decreased from initial measurement. She will proceed with cycle 2 weekly taxol today and return in 1 week for next cycle.  PLAN: -proceed with cycle 2 weekly taxol today -she will begin OTC cold remedies -flu shot upon cold symptom resolution -Return in 1 week for lab, flush, chemo taxol -f/u Dr. Burr Medico in 2 weeks    All questions were answered. The patient knows to call the clinic with any problems, questions or concerns. No barriers to learning was detected. I spent 25 minutes counseling the patient face to face. The total time spent in the appointment was 30 minutes and more than 50% was on counseling and review of test results     Alla Feeling, NP 03/20/17

## 2017-03-20 NOTE — Patient Instructions (Signed)
[facility] Discharge Instructions for Patients Receiving Chemotherapy  Today you received the following chemotherapy agents Paclitaxel.   To help prevent nausea and vomiting after your treatment, we encourage you to take your nausea medication as prescribed.   If you develop nausea and vomiting that is not controlled by your nausea medication, call the clinic.   BELOW ARE SYMPTOMS THAT SHOULD BE REPORTED IMMEDIATELY:  *FEVER GREATER THAN 100.5 F  *CHILLS WITH OR WITHOUT FEVER  NAUSEA AND VOMITING THAT IS NOT CONTROLLED WITH YOUR NAUSEA MEDICATION  *UNUSUAL SHORTNESS OF BREATH  *UNUSUAL BRUISING OR BLEEDING  TENDERNESS IN MOUTH AND THROAT WITH OR WITHOUT PRESENCE OF ULCERS  *URINARY PROBLEMS  *BOWEL PROBLEMS  UNUSUAL RASH Items with * indicate a potential emergency and should be followed up as soon as possible.  Feel free to call the clinic should you have any questions or concerns. The clinic phone number is [phone].  Please show the [place] at check-in to the Emergency Department and triage nurse.

## 2017-03-20 NOTE — Patient Instructions (Signed)
Implanted Port Home Guide An implanted port is a type of central line that is placed under the skin. Central lines are used to provide IV access when treatment or nutrition needs to be given through a person's veins. Implanted ports are used for long-term IV access. An implanted port may be placed because:  You need IV medicine that would be irritating to the small veins in your hands or arms.  You need long-term IV medicines, such as antibiotics.  You need IV nutrition for a long period.  You need frequent blood draws for lab tests.  You need dialysis.  Implanted ports are usually placed in the chest area, but they can also be placed in the upper arm, the abdomen, or the leg. An implanted port has two main parts:  Reservoir. The reservoir is round and will appear as a small, raised area under your skin. The reservoir is the part where a needle is inserted to give medicines or draw blood.  Catheter. The catheter is a thin, flexible tube that extends from the reservoir. The catheter is placed into a large vein. Medicine that is inserted into the reservoir goes into the catheter and then into the vein.  How will I care for my incision site? Do not get the incision site wet. Bathe or shower as directed by your health care provider. How is my port accessed? Special steps must be taken to access the port:  Before the port is accessed, a numbing cream can be placed on the skin. This helps numb the skin over the port site.  Your health care provider uses a sterile technique to access the port. ? Your health care provider must put on a mask and sterile gloves. ? The skin over your port is cleaned carefully with an antiseptic and allowed to dry. ? The port is gently pinched between sterile gloves, and a needle is inserted into the port.  Only "non-coring" port needles should be used to access the port. Once the port is accessed, a blood return should be checked. This helps ensure that the port  is in the vein and is not clogged.  If your port needs to remain accessed for a constant infusion, a clear (transparent) bandage will be placed over the needle site. The bandage and needle will need to be changed every week, or as directed by your health care provider.  Keep the bandage covering the needle clean and dry. Do not get it wet. Follow your health care provider's instructions on how to take a shower or bath while the port is accessed.  If your port does not need to stay accessed, no bandage is needed over the port.  What is flushing? Flushing helps keep the port from getting clogged. Follow your health care provider's instructions on how and when to flush the port. Ports are usually flushed with saline solution or a medicine called heparin. The need for flushing will depend on how the port is used.  If the port is used for intermittent medicines or blood draws, the port will need to be flushed: ? After medicines have been given. ? After blood has been drawn. ? As part of routine maintenance.  If a constant infusion is running, the port may not need to be flushed.  How long will my port stay implanted? The port can stay in for as long as your health care provider thinks it is needed. When it is time for the port to come out, surgery will be   done to remove it. The procedure is similar to the one performed when the port was put in. When should I seek immediate medical care? When you have an implanted port, you should seek immediate medical care if:  You notice a bad smell coming from the incision site.  You have swelling, redness, or drainage at the incision site.  You have more swelling or pain at the port site or the surrounding area.  You have a fever that is not controlled with medicine.  This information is not intended to replace advice given to you by your health care provider. Make sure you discuss any questions you have with your health care provider. Document  Released: 05/26/2005 Document Revised: 11/01/2015 Document Reviewed: 01/31/2013 Elsevier Interactive Patient Education  2017 [employer]  

## 2017-03-23 ENCOUNTER — Ambulatory Visit: Payer: Self-pay | Admitting: Genetic Counselor

## 2017-03-23 ENCOUNTER — Encounter: Payer: Self-pay | Admitting: Genetic Counselor

## 2017-03-23 DIAGNOSIS — Z1379 Encounter for other screening for genetic and chromosomal anomalies: Secondary | ICD-10-CM

## 2017-03-23 NOTE — Progress Notes (Signed)
[facility]      Initial Visit    Patient Name: Kathryn Stanley Patient DOB: 02/27/1976 Patient Age: 41 y.o. Encounter Date: 03/23/2017  Referring Provider: [name], MD  Reason for Call: Discuss results of genetic testing   Kathryn Stanley was seen in the [place] clinic on 03/05/2017 due to a personal history of cancer and concern regarding a hereditary predisposition to cancer in the family. Please refer to the prior Genetics clinic note for more information regarding Kathryn Stanley's medical and family histories and our assessment at the time.   Genetic Testing: At the time of Kathryn Stanley's visit, we recommended she pursue genetic testing of multiple genes associated with hereditary predisposition to cancer. Testing included sequencing and deletion/duplication analysis. 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  A copy of the genetic test report will be scanned into Epic under the media tab.  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 At this time, it is unknown if this finding is associated with increased cancer risk or not. With time, we suspect the lab will determine the significance of it, if any. If we do learn more about it, we will try to contact Kathryn Stanley to discuss it further. However, it is important that she stay in touch with [place] periodically and keep the address and phone number up to date. Medical management and screening are not impacted by the identification of a VUS.  BRCA-Related Cancer Risks: Women with a BRCA1 mutation have a 40-87% lifetime risk of developing breast cancer, compared to the general population's risk of 12%. There is up to a 43% risk of contralateral breast cancer within 10 years of the initial primary. Women also have an increased risk of developing ovarian cancer (16-54%), which is significantly higher  than the general population's risk (1-2%). Risks for other types of cancer, including prostate cancer, pancreatic cancer, and female breast cancer, are increased as well.  MITF Cancer Risks: The c.952G>A (p.Glu318Lys) mutation in MITF, also known as E318K, is associated with an increased risk of melanoma. The risks are not yet established; however, studies suggest the risk may be 3- to 5-fold higher than the general population risk (PMID: 56314970). This variant has been associated with features including high nevi count (>200), fair skin, non-blue eye color, and early-onset melanoma (under age 35). Additionally, there is evidence to suggest this variant may predispose to fast-growing melanomas.   Studies also show an overrepresentation of renal cell carcinoma in individuals with this mutation; however, the studies were performed on relatively small patient populations and these findings have not been independently replicated. Therefore, the risk for renal cancer in individuals with this MITF mutation is currently unknown.    Medical Management: Kathryn Stanley is at increased risk of cancer and we, therefore, discussed the following recommendations from the NCCN (Genetic/Familial High-Risk Assessment: Breast and Ovarian V2.2019) that are specific to women with a BRCA1 mutation. Because the NCCN updates these guidelines periodically, clinicians are recommended to view the most recent guidelines at https://www.martin.info/.   BRCA1 Breast Cancer:  - Starting at age 58: Breast awareness - Women should be familiar with their breasts and promptly report changes to their healthcare provider. Performing regular breast self exams may help increase breast awareness, especially when checked at the end of the menstrual cycle.  - Starting at age 69: Clinical breast exam every 6-12 months. -  Between ages 6-29: Breast MRI screening with contrast (preferred) every year or mammogram with consideration of tomosynthesis if MRI is  unavailable; or individualized based on family history if a breast cancer diagnosis before age 15 is present. - Between ages 62-75: Mammogram with consideration of tomosynthesis and breast MRI screening with contrast every year.  - After age 30: Management should be considered on an individualized basis. - Option of risk-reducing bilateral mastectomies. - Consider risk reduction agents as options for breast cancer, including discussing risks and benefits.  Ovarian Cancer: - Recommend risk-reducing salpingo-oophorectomy (RRSO), typically between 6 and 81 y, and upon completion of child bearing, unless age at diagnosis in the family warrants earlier age for consideration of this surgery.  - While there may be circumstances where ovarian cancer screening with transvaginal ultrasound and a blood test for a protein called CA-125 are helpful, these techniques have not been shown to be effective in detecting early ovarian cancer and are generally not recommended. - Consider risk reduction agents as options for ovarian cancer, including discussing risks and benefits. - Limited data suggest that there may be a slightly increased risk of serous uterine cancer among women with a BRCA1 mutation, but the clinical significance of these findings is unclear. It is recommended to discuss the risks and benefits of concurrent hysterectomy at the time of RRSO for women with a BRCA1 mutation.  Other Cancers: We discussed that while literature has shown other cancers, such as pancreatic cancer, to be associated with BRCA1 mutations, national guidelines do not currently recommend any specific screenings for these cancers. Screening may be individualized based on the family history.  MITF There are currently no established cancer screening guidelines for individuals with a pathogenic MITF mutation. Cancer screening should ultimately be guided by personal and family history.   Recommendations due to increased risk of  melanoma can include: - Regular dermatologic and eye exams for melanoma  - Monthly self-examination of the skin  - Sun protection strategies such as wearing a hat, sunglasses and long protective clothing, applying sunscreen with SPF of 30 or higher and avoiding tanning beds and sun lamps.  - There are no recommended screenings at this time for kidney cancer  Family Members: It is important that all of Kathryn Stanley's relatives (both men and women) know of the presence of these gene mutations. Genetic testing can sort out who in the family is at risk and who is not. Her brother as well as parents are recommended to undergo testing. Kathryn Stanley twin daughters have a 50% chance to have inherited this mutation. However, they are relatively young and this will not be of any consequence to them for several years. We do not test children because there is no risk to them until they are adults. We recommend they have genetic counseling and testing by the time that they are in their early 20s.    Support and Resources: If Kathryn Stanley is interested in BRCA-specific information and support, there are two groups, Facing Our Risk (www.facingourrisk.com) and Bright Pink (www.brightpink.org) which some people have found useful. They provide opportunities to speak with other individuals from high-risk families. To locate genetic counselors in other cities, visit the website of the [employer] (ArtistMovie.se) and Secretary/administrator for a Social worker by zip code.  We encouraged Kathryn Stanley to remain in contact with [place] on an annual basis so we can update her personal and family histories, and let her know of advances in cancer genetics that may  benefit the family. Our contact number was provided and she knows she is welcome to call anytime with additional questions.    Steele Berg, MS, [place] Certified Genetic Counselor phone: [phone]

## 2017-03-26 ENCOUNTER — Telehealth: Payer: Self-pay | Admitting: Genetics

## 2017-03-26 NOTE — Telephone Encounter (Signed)
Kathryn Stanley husband answered.  I told him that she was trying to get in contact with someone in genetics, and that the genetic counselor's number she was given to contact, [name], is at a different cancer center location today and will not be able to return calls until tomorrow.  Therefore if she wants to speak to genetics today, I left him my number for her to call.

## 2017-03-27 ENCOUNTER — Other Ambulatory Visit: Payer: Self-pay | Admitting: Medical

## 2017-03-27 ENCOUNTER — Ambulatory Visit (HOSPITAL_BASED_OUTPATIENT_CLINIC_OR_DEPARTMENT_OTHER): Payer: Medicaid Other

## 2017-03-27 ENCOUNTER — Ambulatory Visit (HOSPITAL_BASED_OUTPATIENT_CLINIC_OR_DEPARTMENT_OTHER): Payer: Medicaid Other | Admitting: Medical

## 2017-03-27 ENCOUNTER — Other Ambulatory Visit (HOSPITAL_BASED_OUTPATIENT_CLINIC_OR_DEPARTMENT_OTHER): Payer: Medicaid Other

## 2017-03-27 ENCOUNTER — Ambulatory Visit: Payer: Medicaid Other

## 2017-03-27 DIAGNOSIS — R05 Cough: Secondary | ICD-10-CM

## 2017-03-27 DIAGNOSIS — R509 Fever, unspecified: Secondary | ICD-10-CM | POA: Diagnosis present

## 2017-03-27 DIAGNOSIS — C50412 Malignant neoplasm of upper-outer quadrant of left female breast: Secondary | ICD-10-CM

## 2017-03-27 DIAGNOSIS — Z170 Estrogen receptor positive status [ER+]: Principal | ICD-10-CM

## 2017-03-27 DIAGNOSIS — Z95828 Presence of other vascular implants and grafts: Secondary | ICD-10-CM

## 2017-03-27 DIAGNOSIS — J069 Acute upper respiratory infection, unspecified: Secondary | ICD-10-CM | POA: Diagnosis not present

## 2017-03-27 DIAGNOSIS — Z5111 Encounter for antineoplastic chemotherapy: Secondary | ICD-10-CM

## 2017-03-27 LAB — CBC WITH DIFFERENTIAL/PLATELET
BASO%: 1.5 % (ref 0.0–2.0)
BASOS ABS: 0.1 10*3/uL (ref 0.0–0.1)
EOS%: 8.6 % — AB (ref 0.0–7.0)
Eosinophils Absolute: 0.5 10*3/uL (ref 0.0–0.5)
HCT: 28.8 % — ABNORMAL LOW (ref 34.8–46.6)
HGB: 9.9 g/dL — ABNORMAL LOW (ref 11.6–15.9)
LYMPH%: 12.8 % — ABNORMAL LOW (ref 14.0–49.7)
MCH: 30 pg (ref 25.1–34.0)
MCHC: 34.5 g/dL (ref 31.5–36.0)
MCV: 86.9 fL (ref 79.5–101.0)
MONO#: 0.7 10*3/uL (ref 0.1–0.9)
MONO%: 13 % (ref 0.0–14.0)
NEUT#: 3.5 10*3/uL (ref 1.5–6.5)
NEUT%: 64.1 % (ref 38.4–76.8)
Platelets: 241 10*3/uL (ref 145–400)
RBC: 3.31 10*6/uL — ABNORMAL LOW (ref 3.70–5.45)
RDW: 17.3 % — ABNORMAL HIGH (ref 11.2–14.5)
WBC: 5.5 10*3/uL (ref 3.9–10.3)
lymph#: 0.7 10*3/uL — ABNORMAL LOW (ref 0.9–3.3)

## 2017-03-27 LAB — COMPREHENSIVE METABOLIC PANEL
ALT: 58 U/L — ABNORMAL HIGH (ref 0–55)
AST: 25 U/L (ref 5–34)
Albumin: 3.3 g/dL — ABNORMAL LOW (ref 3.5–5.0)
Alkaline Phosphatase: 80 U/L (ref 40–150)
Anion Gap: 10 mEq/L (ref 3–11)
BUN: 13.4 mg/dL (ref 7.0–26.0)
CALCIUM: 9 mg/dL (ref 8.4–10.4)
CHLORIDE: 106 meq/L (ref 98–109)
CO2: 23 mEq/L (ref 22–29)
Creatinine: 0.8 mg/dL (ref 0.6–1.1)
EGFR: >60 mL/min/{1.73_m2} (ref >60)
GLUCOSE: 132 mg/dL (ref 70–140)
POTASSIUM: 3.5 meq/L (ref 3.5–5.1)
SODIUM: 139 meq/L (ref 136–145)
Total Bilirubin: 0.4 mg/dL (ref 0.20–1.20)
Total Protein: 6.5 g/dL (ref 6.4–8.3)

## 2017-03-27 MED ORDER — DIPHENHYDRAMINE HCL 50 MG/ML IJ SOLN: 25.0000 mg | Freq: Once | INTRAMUSCULAR | Status: DC | PRN

## 2017-03-27 MED ORDER — DEXAMETHASONE SODIUM PHOSPHATE 10 MG/ML IJ SOLN
INTRAMUSCULAR | Status: AC
  Filled 2017-03-27: qty 1

## 2017-03-27 MED ORDER — FAMOTIDINE IN NACL 20-0.9 MG/50ML-% IV SOLN: 20.0000 mg | Freq: Once | INTRAVENOUS | Status: DC | PRN

## 2017-03-27 MED ORDER — HEPARIN SOD (PORK) LOCK FLUSH 100 UNIT/ML IV SOLN
250.0000 [IU] | Freq: Once | INTRAVENOUS | Status: DC | PRN
  Filled 2017-03-27: qty 5

## 2017-03-27 MED ORDER — SODIUM CHLORIDE 0.9% FLUSH
10.0000 mL | Freq: Once | INTRAVENOUS | Status: AC
  Administered 2017-03-27: 10 mL
  Filled 2017-03-27: qty 10

## 2017-03-27 MED ORDER — FAMOTIDINE IN NACL 20-0.9 MG/50ML-% IV SOLN
20.0000 mg | Freq: Once | INTRAVENOUS | Status: AC
  Administered 2017-03-27: 20 mg via INTRAVENOUS

## 2017-03-27 MED ORDER — DIPHENHYDRAMINE HCL 50 MG/ML IJ SOLN
50.0000 mg | Freq: Once | INTRAMUSCULAR | Status: AC
  Administered 2017-03-27: 50 mg via INTRAVENOUS

## 2017-03-27 MED ORDER — DIPHENHYDRAMINE HCL 50 MG/ML IJ SOLN
INTRAMUSCULAR | Status: AC
  Filled 2017-03-27: qty 1

## 2017-03-27 MED ORDER — AZITHROMYCIN 250 MG PO TABS: ORAL_TABLET | ORAL | 0 refills | Status: DC

## 2017-03-27 MED ORDER — SODIUM CHLORIDE 0.9 % IV SOLN: Freq: Once | INTRAVENOUS | Status: DC | PRN

## 2017-03-27 MED ORDER — SODIUM CHLORIDE 0.9 % IV SOLN: Freq: Once | INTRAVENOUS | Status: DC

## 2017-03-27 MED ORDER — DEXAMETHASONE SODIUM PHOSPHATE 10 MG/ML IJ SOLN
10.0000 mg | Freq: Once | INTRAMUSCULAR | Status: AC
  Administered 2017-03-27: 10 mg via INTRAVENOUS

## 2017-03-27 MED ORDER — SODIUM CHLORIDE 0.9 % IV SOLN
80.0000 mg/m2 | Freq: Once | INTRAVENOUS | Status: AC
  Administered 2017-03-27: 156 mg via INTRAVENOUS
  Filled 2017-03-27: qty 26

## 2017-03-27 MED ORDER — FAMOTIDINE IN NACL 20-0.9 MG/50ML-% IV SOLN
INTRAVENOUS | Status: AC
  Filled 2017-03-27: qty 50

## 2017-03-27 MED ORDER — METHYLPREDNISOLONE SODIUM SUCC 125 MG IJ SOLR: 125.0000 mg | Freq: Once | INTRAMUSCULAR | Status: DC | PRN

## 2017-03-27 MED ORDER — COLD PACK MISC ONCOLOGY
1.0000 | Freq: Once | Status: DC | PRN
  Filled 2017-03-27: qty 1

## 2017-03-27 MED FILL — AZITHROMYCIN 250 MG TAB: 250 | 5 days supply | Qty: 6 | Fill #0

## 2017-03-27 NOTE — Progress Notes (Signed)
[facility] Progress Note   Kathryn Stanley [id number] November 09, 1975 41 y.o.  Kathryn Stanley is managed by Dr. 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  Fever: The patient was instructed to continue using Advil or Tylenol today and tomorrow as needed.  Upper respiratory tract infection: The patient was given a prescription for a Z-Pak.  Please see After Visit Summary for patient specific instructions.  Future Appointments Date Time Provider [place]  04/03/2017 9:45 AM [facility] LAB 4 [facility] None  04/03/2017 10:15 AM [facility] J32 DNS [facility] None  04/03/2017 10:45 AM [name], MD [facility] None  04/03/2017 11:45 AM [facility] C10 [facility] None  04/10/2017 9:15 AM [facility] LAB 3 [facility] None  04/10/2017 9:45 AM [facility] FLUSH NURSE [facility] None  04/10/2017 11:00 AM [facility] G24 [facility] None  04/17/2017 10:00 AM [facility] ECHO 1-BUZZ [facility]  04/17/2017 11:40 AM Larey Dresser, MD [facility] None  04/17/2017 1:15 PM [facility] LAB ONLY [facility] None  04/17/2017 1:30 PM [facility] INJ NURSE [facility] None  04/17/2017 2:30 PM [facility] H30 [facility] None    No orders of the defined types were placed in this encounter.      Subjective:   Patient ID:  Kathryn Stanley is a 41 y.o. (DOB 1975-11-13) female.  Chief Complaint: No chief complaint on file.   HPI Kathryn Stanley presents to the office today for cycle 3 of dose dense paclitaxel. The patient reports having a three-day history of fevers. She had a fever of 100.4 on Wednesday. She has been taking Advil but reports that her fever will return  once she stops taking Advil. She has had a nonproductive cough for the past 3 weeks. She denies chills, diaphoresis, postnasal drainage, facial pressure, sinus pain and has had no sick contacts. I was asked to see the patient in the infusion room prior to beginning chemotherapy.  Medications: I have reviewed the patient's current medications.  Allergies:  Allergies  Allergen Reactions  . Chlorhexidine Gluconate Rash  . Amoxicillin Rash    PATIENT HAS HAD A PCN REACTION WITH IMMEDIATE RASH, FACIAL/TONGUE/THROAT SWELLING, SOB, OR LIGHTHEADEDNESS WITH HYPOTENSION:  #  #  #  YES  #  #  #   Has patient had a PCN reaction causing severe rash involving mucus membranes or skin necrosis: No Has patient had a PCN reaction that required hospitalization: No Has patient had a PCN reaction occurring within the last 10 years: No If all of the above answers are "NO", then may proceed with Cephalosporin use.   . Nicotine     Patch caused soreness at application site    Past Medical History:  Diagnosis Date  . Cancer [facility])    left breast cancer  . Dermatologic problem    possible psoriasis. Pt has not had been diagnosed by dermatology.  . 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 GERD (gastroesophageal reflux disease)   . Tobacco dependence    trying to quit with nicotene patches    Past Surgical History:  Procedure Laterality Date  . CESAREAN SECTION    . PORTACATH PLACEMENT  N/A 01/14/2017   Procedure: INSERTION PORT-A-CATH WITH [place];  Surgeon: [name], MD;  Location: [facility];  Service: General;  Laterality: N/A;    Family History  Problem Relation Age of Onset  . Stroke Maternal Grandmother   . Stroke Father   . Hypertension Father   . Other Father        Adopted    Social History   Social History  . Marital status: Married    Spouse name: N/A  . Number of children: N/A  . Years of education: N/A    Occupational History  . Not on file.   Social History Main Topics  . Smoking status: Former Smoker    Packs/day: 1.00    Years: 17.00    Types: Cigarettes    Quit date: 12/15/2016  . Smokeless tobacco: Never Used  . Alcohol use No  . Drug use: No  . Sexual activity: Yes    Birth control/ protection: None   Other Topics Concern  . Not on file   Social History Narrative  . No narrative on file    Past Medical History, Surgical history, Social history, and Family history were reviewed and updated as appropriate.   Please see review of systems for further details on the patient's review from today.   Review of Systems:  Review of Systems  Constitutional: Positive for fever. Negative for chills and diaphoresis.  HENT: Negative for postnasal drip, sinus pain, sinus pressure, sneezing and sore throat.   Respiratory: Positive for cough.     Objective:   Physical Exam:  There were no vitals taken for this visit. ECOG: 0  Physical Exam  Constitutional: No distress.  HENT:  Head: Normocephalic and atraumatic.  Nose: Right sinus exhibits no maxillary sinus tenderness and no frontal sinus tenderness. Left sinus exhibits no maxillary sinus tenderness and no frontal sinus tenderness.  Mouth/Throat: Oropharynx is clear and moist. No oropharyngeal exudate.  Eyes: Right eye exhibits no discharge. Left eye exhibits no discharge. No scleral icterus.  Cardiovascular: Normal rate, regular rhythm and normal heart sounds.  Exam reveals no gallop and no friction rub.   No murmur heard. Pulmonary/Chest: Effort normal and breath sounds normal. No respiratory distress. She has no wheezes. She has no rales.  Musculoskeletal: She exhibits no edema.  Neurological: She is alert.  Skin: Skin is warm and dry. No rash noted. She is not diaphoretic. No erythema.    Lab Review:     Component Value Date/Time   NA 139 03/27/2017 0923   K 3.5 03/27/2017 0923   CL 108 01/14/2017 0814   CO2 23  03/27/2017 0923   GLUCOSE 132 03/27/2017 0923   BUN 13.4 03/27/2017 0923   CREATININE 0.8 03/27/2017 0923   CALCIUM 9.0 03/27/2017 0923   PROT 6.5 03/27/2017 0923   ALBUMIN 3.3 (L) 03/27/2017 0923   AST 25 03/27/2017 0923   ALT 58 (H) 03/27/2017 0923   ALKPHOS 80 03/27/2017 0923   BILITOT 0.40 03/27/2017 0923   GFRNONAA >60 01/14/2017 0814   GFRAA >60 01/14/2017 0814       Component Value Date/Time   WBC 5.5 03/27/2017 0923   WBC 6.7 01/14/2017 0814   RBC 3.31 (L) 03/27/2017 0923   RBC 4.75 01/14/2017 0814   HGB 9.9 (L) 03/27/2017 0923   HCT 28.8 (L) 03/27/2017 0923   PLT 241 03/27/2017 0923   MCV 86.9 03/27/2017 0923   MCH 30.0 03/27/2017 0923   MCH 29.1 01/14/2017 5784  MCHC 34.5 03/27/2017 0923   MCHC 34.5 01/14/2017 0814   RDW 17.3 (H) 03/27/2017 0923   LYMPHSABS 0.7 (L) 03/27/2017 0923   MONOABS 0.7 03/27/2017 0923   EOSABS 0.5 03/27/2017 0923   BASOSABS 0.1 03/27/2017 0923   -------------------------------  Imaging from last 24 hours (if applicable):  Radiology interpretation: No results found.

## 2017-04-02 NOTE — Progress Notes (Signed)
[place]  [phone] [phone]  Clinic Follow-up Note   Patient Care Team: [name], MD as PCP - General (Internal Medicine) [name], MD as Consulting Physician (General Surgery) [name], MD as Consulting Physician (Hematology) [name], MD as Consulting Physician (Radiation Oncology) 04/03/2017  CHIEF COMPLAINTS:  Follow-up for Malignant neoplasm of upper-outer quadrant of left breast in female, estrogen receptor positive   Oncology History   Cancer Staging Malignant neoplasm of upper-outer quadrant of left breast in female, estrogen receptor positive ([place]) Staging form: Breast, AJCC 8th Edition - Clinical stage from 12/29/2016: Stage IIIA (cT2, cN1, cM0, G3, ER: Positive, PR: Negative, HER2: Negative) - Signed by [name], MD on 01/05/2017       Malignant neoplasm of upper-outer quadrant of left breast in female, estrogen receptor positive ([place])   12/25/2016 Mammogram    [place] and [facility] Diagnostic Breast Tomo Bilateral 12/25/16 IMPRESSION: 1. Suspicious mass 2.3 x 1.6 x 3.0 cm  in the 130 o'clock location of the left breast 8cm from the nipple, biopsy recommended. Adjacent simple cyst is 1.9 cm. 2. Suspicious left axillary lymph node 2.2cm for which biopsy is indicated. 3. Indeterminate group of calcifications in the upper central portion of the left breast for which biopsy is recommended.       12/29/2016 Initial Biopsy    Diagnosis 12/29/16 1. Breast, left, needle core biopsy, stereotactic, upper inner quadrant - FIBROCYSTIC CHANGES INCLUDING APOCRINE METAPLASIA WITH CALCIFICATIONS - NO MALIGNANCY IDENTIFIED 2. Breast, left, needle core biopsy, ultrasound, 1:30 o'clock - INVASIVE MAMMARY CARCINOMA, G3 - SEE COMMENT 3. Lymph node, needle/core biopsy, ultrasound, left axillary - METASTIC CARCINOMA INVOLVING ONE LYMPH NODE (1/1) E-cadherin is positive supporting a ductal origin.       12/29/2016 Receptors her2    ER  15%+, weak staining  PR - HER2- Ki67 10%       12/29/2016 Initial Diagnosis    Malignant neoplasm of upper-outer quadrant of left breast in female, estrogen receptor positive ([place])      01/13/2017 Echocardiogram    ECHO 01/13/17 Study Conclusions - Left ventricle: The cavity size was normal. Systolic function was   normal. The estimated ejection fraction was in the range of 60%   to 65%. Wall motion was normal; there were no regional wall   motion abnormalities. Left ventricular diastolic function   parameters were normal. - Mitral valve: There was trivial regurgitation. - Right atrium: The atrium was mildly dilated. - Tricuspid valve: There was trivial regurgitation.       01/14/2017 Surgery    Port placement by Dr. Donne Hazel       01/15/2017 Imaging    Whole Body bone Scan 01/15/17 IMPRESSION: 1.  No significant abnormality identified.      01/15/2017 Imaging    CT CAP W Contrast 01/15/17 IMPRESSION: 1. Left lateral breast mass with pathologic left axillary adenopathy including a 2.0 cm left axillary lymph node. 2. Several hypodense lesions in the liver are likely benign lesions such as cysts. However, these are technically too small to characterize by CT. Possibilities for further workup include surveillance or hepatic protocol MRI with and without contrast. 3. There is also a tiny hypodense lesion in the spleen which is technically nonspecific but statistically highly likely to be benign. 4.  Prominent stool throughout the colon favors constipation      01/16/2017 -  Chemotherapy    Neoadjuvant chemo AC every 2 weeks for 4 cycles starting on 01/16/2017 and ended  02/27/17. 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   Analysis also detected a Variant of Uncertain Significance (VUS) in the NBN gene called c.628G>T  (p.Val210Phe)        HISTORY OF PRESENTING ILLNESS: 01/07/17 Kathryn Stanley 41 y.o. female is here because of newly diagnosed Malignant neoplasm of upper-outer quadrant of left breast in female, estrogen receptor positive. She presents to the breast clinic today with her common law husband. She felt the lump herself 3 months ago. She felt pain like "stabbing" first. She then had a mammogram 12/25/16 and subsequent biopsy   In the past she was using smoking match and quit smoking "cold [place]" that was 1 month ago.   Today she reports her breast pain still comes and goes. She has not noticed any new changes. 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 Lately has regular period. She has a weak cervix and her children were born premature. She is currently working at [employer] and works on her feet all the time, working 5 days a week.   GYN HISTORY  Menarchal: 10 LMP: 12/10/16 Contraceptive: No HRT: NA GP: G2P1 - twins    CURRENT THERAPY: Neoadjuvant chemo AC every 2 weeks for 4 cycles starting on 01/16/2017 and ended 02/27/17. Then proceed with weekly taxol for 12 treatments starting 03/13/17, will add Carboplatin AUC 2 on 04/03/17 to weekly taxol.   INTERVAL HISTORY:  Kathryn Stanley is here for a follow-up and cycle 4 Taxol. She presents to the clinic with her husband today.  She notes she has not had fever since her Zpac but still has dry cough, much improved. She denies any trouble with her throat.  With chemo she does have fatigue but she improves as the week goes. This is much easier for her to tolerate. Her appetite has been up and down and her weight is fluctuating as well. She has not noticed change in her breast lump.   Her BRCA1+ results were discussed with patient. She wants to proceed with Prophylactic steps.    MEDICAL HISTORY:  Past Medical History:  Diagnosis Date  . Cancer [facility])    left breast cancer    . Dermatologic problem    possible psoriasis. Pt has not had been diagnosed by dermatology.  . 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 GERD (gastroesophageal reflux disease)   . Tobacco dependence    trying to quit with nicotene patches    SURGICAL HISTORY: Past Surgical History:  Procedure Laterality Date  . CESAREAN SECTION    . PORTACATH PLACEMENT N/A 01/14/2017   Procedure: INSERTION PORT-A-CATH WITH [place];  Surgeon: [name], MD;  Location: [facility];  Service: General;  Laterality: N/A;    SOCIAL HISTORY: Social History   Social History  . Marital status: Married    Spouse name: N/A  . Number of children: N/A  . Years of education: N/A   Occupational History  . Not on file.   Social History Main Topics  . Smoking status: Former Smoker    Packs/day: 1.00    Years: 17.00    Types: Cigarettes    Quit date: 12/15/2016  . Smokeless tobacco: Never Used  .  Alcohol use No  . Drug use: No  . Sexual activity: Yes    Birth control/ protection: None   Other Topics Concern  . Not on file   Social History Narrative  . No narrative on file    FAMILY HISTORY: Family History  Problem Relation Age of Onset  . Stroke Maternal Grandmother   . Stroke Father   . Hypertension Father   . Other Father        Adopted    ALLERGIES:  is allergic to chlorhexidine gluconate; amoxicillin; and nicotine.  MEDICATIONS:  Current Outpatient Prescriptions  Medication Sig Dispense Refill  . azithromycin (ZITHROMAX) 250 MG tablet 2 tablets a day 1 then 1 tablet days 2 through 5 6 each 0  . famotidine (PEPCID) 10 MG tablet Take 10 mg by mouth daily as needed for heartburn or indigestion.    Marland Kitchen ibuprofen (ADVIL,MOTRIN) 200 MG tablet Take 200-400 mg by mouth daily as needed for headache or moderate pain.    Marland Kitchen lidocaine-prilocaine (EMLA) cream Apply to affected area once 30 g 3  . ondansetron (ZOFRAN) 8 MG tablet  Take 1 tablet (8 mg total) by mouth 2 (two) times daily as needed. Start on the third day after chemotherapy. (Patient not taking: Reported on 02/27/2017) 30 tablet 1  . prochlorperazine (COMPAZINE) 10 MG tablet TAKE 1 TABLET EVERY 6 HOURS AS NEEDED FOR NAUSEA OR VOMITING (Patient not taking: Reported on 02/27/2017) 30 tablet 2  . traMADol (ULTRAM) 50 MG tablet Take 1 tablet (50 mg total) by mouth every 6 (six) hours as needed. (Patient not taking: Reported on 03/13/2017) 12 tablet 1   No current facility-administered medications for this visit.    REVIEW OF SYSTEMS:   Constitutional: Denies abnormal night sweats.  (+) complete hair loss Eyes: Denies blurriness of vision, double vision or watery eyes Ears, nose, mouth, throat, and face: Denies mucositis or sore throat Respiratory: Denies dyspnea or wheezes (+) Dry cough  Cardiovascular: Denies palpitation, chest discomfort or lower extremity swelling Gastrointestinal:  Denies nausea, heartburn.  Skin: Denies abnormal skin rashes Lymphatics: Denies new lymphadenopathy or easy bruising Neurological:Denies numbness, tingling or new weaknesses MSK: negative Breast: (+) occasional shooting pain in left breast, palpable mass in left breast, now smaller and softer Behavioral/Psych: Mood is stable, no new changes  All other systems were reviewed with the patient and are negative.  PHYSICAL EXAMINATION:  ECOG PERFORMANCE STATUS: 1  Vitals:   04/03/17 1107  BP: 128/78  Pulse: 96  Resp: 20  Temp: 98 F (36.7 C)  SpO2: 100%   Filed Weights   04/03/17 1107  Weight: 186 lb 8 oz (84.6 kg)    GENERAL:alert, no distress and comfortable SKIN: skin color, texture, turgor are normal, no rashes or significant lesions EYES: normal, conjunctiva are pink and non-injected, sclera clear OROPHARYNX:no exudate, no erythema and lips, buccal mucosa, and tongue normal  NECK: supple, thyroid normal size, non-tender, without nodularity LYMPH:  no palpable  lymphadenopathy in the cervical, axillary or inguinal LUNGS: clear to auscultation and percussion with normal breathing effort HEART: regular rate & rhythm and no murmurs and no lower extremity edema ABDOMEN:abdomen soft, non-tender and normal bowel sounds Musculoskeletal:no cyanosis of digits and no clubbing  PSYCH: alert & oriented x 3 with fluent speech NEURO: no focal motor/sensory deficits Breast: (+) previous 1 cm,  now 0.8 cm mass in UOQ of left breast is softer than before. (+) no longer palpable lymph node in left axilary that was previously  about 1 cm.  LABORATORY DATA:  I have reviewed the data as listed CBC Latest Ref Rng & Units 04/03/2017 03/27/2017 03/20/2017  WBC 3.9 - 10.3 10e3/uL 5.5 5.5 6.3  Hemoglobin 11.6 - 15.9 g/dL 10.0(L) 9.9(L) 10.4(L)  Hematocrit 34.8 - 46.6 % 30.3(L) 28.8(L) 29.8(L)  Platelets 145 - 400 10e3/uL 261 241 346    CMP Latest Ref Rng & Units 04/03/2017 03/27/2017 03/20/2017  Glucose 70 - 140 mg/dl 98 132 83  BUN 7.0 - 26.0 mg/dL 15.3 13.4 13.5  Creatinine 0.6 - 1.1 mg/dL 0.8 0.8 0.8  Sodium 136 - 145 mEq/L 140 139 139  Potassium 3.5 - 5.1 mEq/L 4.2 3.5 4.0  Chloride 101 - 111 mmol/L - - -  CO2 22 - 29 mEq/L 22 23 23   Calcium 8.4 - 10.4 mg/dL 9.2 9.0 9.1  Total Protein 6.4 - 8.3 g/dL 6.7 6.5 6.7  Total Bilirubin 0.20 - 1.20 mg/dL 0.35 0.40 0.40  Alkaline Phos 40 - 150 U/L 82 80 84  AST 5 - 34 U/L 28 25 28   ALT 0 - 55 U/L 72(H) 58(H) 55     PATHOLOGY  Diagnosis 12/29/16 1. Breast, left, needle core biopsy, stereotactic, upper inner quadrant - FIBROCYSTIC CHANGES INCLUDING APOCRINE METAPLASIA WITH CALCIFICATIONS - NO MALIGNANCY IDENTIFIED 2. Breast, left, needle core biopsy, ultrasound, 1:30 o'clock - INVASIVE MAMMARY CARCINOMA - SEE COMMENT 3. Lymph node, needle/core biopsy, ultrasound, left axillary - METASTIC CARCINOMA INVOLVING ONE LYMPH NODE (1/1) Microscopic Comment 2. The biopsy material shows an infiltrative proliferation of  cells with large vesicular nuclei with conspicuous nucleoli, arranged linearly and in small clusters. There are scattered cells with marked pleomorphism and cytologic atypia. Based on the biopsy, the carcinoma appears Nottingham grade 3 of 3 and measures 0.9 cm in greatest linear extent. E-cadherin and prognostic markers (ER/PR/ki-67/HER2-FISH)are pending and will be reported in an addendum. Dr. Tresa Moore reviewed the case and agrees with the above diagnosis. This case was called to [facility] on December 30, 2016. Results: IMMUNOHISTOCHEMICAL AND MORPHOMETRIC ANALYSIS PERFORMED MANUALLY Estrogen Receptor: 15%, POSITIVE, WEAK STAINING INTENSITY Progesterone Receptor: 0%, NEGATIVE Proliferation Marker Ki67: 10% COMMENT: The negative hormone receptor study(ies) in this case has An internal positive control. Results: HER2 - NEGATIVE RATIO OF HER2/CEP17 SIGNALS 1.63 AVERAGE HER2 COPY NUMBER PER CELL 3.10   PROCEDURES  ECHO 01/13/17 Study Conclusions - Left ventricle: The cavity size was normal. Systolic function was   normal. The estimated ejection fraction was in the range of 60%   to 65%. Wall motion was normal; there were no regional wall   motion abnormalities. Left ventricular diastolic function   parameters were normal. - Mitral valve: There was trivial regurgitation. - Right atrium: The atrium was mildly dilated. - Tricuspid valve: There was trivial regurgitation.  RADIOGRAPHIC STUDIES: I have personally reviewed the radiological images as listed and agreed with the findings in the report. No results found. ASSESSMENT & PLAN:  Kathryn Stanley is a 41 y.o. premenopausal female with no significant past medical history, presented with a palpable left breast mass.  1. Malignant neoplasm of upper-outer quadrant of left breast in female, ductal carcinoma, cT2N1M0, Stage IIIa, ER weakly +, PR - , HER2 - , Grade 3 -We reviewed her mammogram and initial biopsy results in  details with patient and her husband.  -Her tumor is weakly ER positive, PR negative, HER-2 negative, grade 3, she has positive lymph nodes, those are all high risks for recurrence. I recommend neoadjuvant  or adjuvant chemotherapy to reduce her risk of recurrence after surgery. -We discussed that the benefit of neoadjuvant chemotherapy, to shrink the tumor and lymph nodes, and potentially she would be a candidate for lumpectomy and may not need lymph node dissection if she has good response. I have discussed this with her surgeon Dr. Donne Hazel today, both of [place] recommend her to consider neoadjuvant chemotherapy. She agrees.  -we reviewed her staging scan CT from 01/15/17 in person. A few hypodense lesion were seen in the liver, appear to be benign cysts. CT and bone scan otherwise negative for distant metastasis. 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 This was discussed with patient. -I recommended neoadjuvant chemotherapy with dose dense Adriamycin and Cytoxan (AC) every 2 weeks for 4 cycles, followed by weekly Taxol for 12 weeks. She agreed. -She completed AC treatment 01/16/17-02/27/17. She tolerated well other than bone pain and constipation. She has had good clinical responce to treatment as her tumor is slightly smaller and her lymph node is no longer palpable upon 03/13/17 breast exam.  -She starts her weekly taxol 03/13/17, tolerating well so far -I discussed adding Carboplatin to her weekly taxol since her genetic testing is positive for BRCA1 mutation. I discussed the side effects and the benefits to helping further shrink her tumor. I explained that BRCA mutated breast cancer is more sensitive to platinum. This would be in attempt to achieve better or even complete response from chemotherapy. -Labs reviewed, her ALT is slightly elevated at 72, but adequate to continue with treatment today.  -I discussed her BRCA1 positive results. I explained the prophylactic steps and she agreed  to proceed with  bilateral oophorectomy and mastectomy. She is interested in breast reconstruction post mastectomy.   2. Genetics: BRCA1 + -Due to her young age of diagnosis I suggested she get genetic testing. 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  -She is agreeable with bilateral oophorectomy and mastectomy, will copy note to her Gynecologist  -I explained when her children get older they need to get tested and start cancer screenings earlier.   3. Financial Support -They are not insured and are attempting to get an orange card.  -They have met our financial support/social worker  4. Cold symptoms  -developed week of 03/13/17 with low grade fever, body aches, chills, headache, and dry cough.  -Will hold flu vaccination until resolved.  -She would like to proceed with chemotherapy today as labs are adequate. -I highly suggested she contacts [place] if her fever reaches 100.4 F or above. I encouraged her to stay away from certain environments until this resolves.  -She is fine to take advil, tylenol  -Will defer flu shot until symptoms resolve -Symptoms much improved since Zpac, only slight dry cough remains -She is fine to proceed with Flu vaccination today     PLAN:  -Flu shot today  -will proceed with weekly Taxol today, due to her BRCA1 mutation, I will add weekly carboplatin AUC 2 to taxol  -Continue lab and treatment every week, we will see her back in one week    All questions were answered. The patient knows to call the clinic with any problems, questions or concerns. I spent 20 minutes counseling the patient face to face. The total time spent in the appointment was 25 minutes and more  than 50% was on counseling.  This document serves as a record of services  personally performed by [name], MD. It was created on her behalf by Joslyn Devon, a trained medical scribe. The creation of this record is based on the scribe's personal observations and the provider's statements to them. This document has been checked and approved by the attending provider.     [name], MD 04/03/2017 11:24 AM

## 2017-04-03 ENCOUNTER — Ambulatory Visit (HOSPITAL_BASED_OUTPATIENT_CLINIC_OR_DEPARTMENT_OTHER): Payer: Medicaid Other

## 2017-04-03 ENCOUNTER — Ambulatory Visit: Payer: Medicaid Other

## 2017-04-03 ENCOUNTER — Encounter: Payer: Self-pay | Admitting: Hematology

## 2017-04-03 ENCOUNTER — Other Ambulatory Visit: Payer: Medicaid Other

## 2017-04-03 ENCOUNTER — Ambulatory Visit (HOSPITAL_BASED_OUTPATIENT_CLINIC_OR_DEPARTMENT_OTHER): Payer: Medicaid Other | Admitting: Hematology

## 2017-04-03 ENCOUNTER — Other Ambulatory Visit (HOSPITAL_BASED_OUTPATIENT_CLINIC_OR_DEPARTMENT_OTHER): Payer: Medicaid Other

## 2017-04-03 VITALS — BP 128/78 | HR 96 | Temp 98.0°F | Resp 20 | Ht 64.0 in | Wt 186.5 lb

## 2017-04-03 DIAGNOSIS — Z5111 Encounter for antineoplastic chemotherapy: Secondary | ICD-10-CM

## 2017-04-03 DIAGNOSIS — R05 Cough: Secondary | ICD-10-CM | POA: Diagnosis not present

## 2017-04-03 DIAGNOSIS — C50412 Malignant neoplasm of upper-outer quadrant of left female breast: Secondary | ICD-10-CM

## 2017-04-03 DIAGNOSIS — Z1501 Genetic susceptibility to malignant neoplasm of breast: Secondary | ICD-10-CM

## 2017-04-03 DIAGNOSIS — Z170 Estrogen receptor positive status [ER+]: Secondary | ICD-10-CM

## 2017-04-03 DIAGNOSIS — Z1509 Genetic susceptibility to other malignant neoplasm: Secondary | ICD-10-CM

## 2017-04-03 DIAGNOSIS — Z95828 Presence of other vascular implants and grafts: Secondary | ICD-10-CM

## 2017-04-03 LAB — CBC WITH DIFFERENTIAL/PLATELET
BASO%: 1.1 % (ref 0.0–2.0)
Basophils Absolute: 0.1 10*3/uL (ref 0.0–0.1)
EOS ABS: 0.5 10*3/uL (ref 0.0–0.5)
EOS%: 9.7 % — ABNORMAL HIGH (ref 0.0–7.0)
HCT: 30.3 % — ABNORMAL LOW (ref 34.8–46.6)
HGB: 10 g/dL — ABNORMAL LOW (ref 11.6–15.9)
LYMPH%: 15.8 % (ref 14.0–49.7)
MCH: 29.7 pg (ref 25.1–34.0)
MCHC: 33 g/dL (ref 31.5–36.0)
MCV: 89.9 fL (ref 79.5–101.0)
MONO#: 0.8 10*3/uL (ref 0.1–0.9)
MONO%: 14.5 % — ABNORMAL HIGH (ref 0.0–14.0)
NEUT%: 58.9 % (ref 38.4–76.8)
NEUTROS ABS: 3.2 10*3/uL (ref 1.5–6.5)
PLATELETS: 261 10*3/uL (ref 145–400)
RBC: 3.37 10*6/uL — ABNORMAL LOW (ref 3.70–5.45)
RDW: 16.9 % — ABNORMAL HIGH (ref 11.2–14.5)
WBC: 5.5 10*3/uL (ref 3.9–10.3)
lymph#: 0.9 10*3/uL (ref 0.9–3.3)

## 2017-04-03 LAB — COMPREHENSIVE METABOLIC PANEL
ALT: 72 U/L — AB (ref 0–55)
AST: 28 U/L (ref 5–34)
Albumin: 3.5 g/dL (ref 3.5–5.0)
Alkaline Phosphatase: 82 U/L (ref 40–150)
Anion Gap: 10 mEq/L (ref 3–11)
BUN: 15.3 mg/dL (ref 7.0–26.0)
CALCIUM: 9.2 mg/dL (ref 8.4–10.4)
CHLORIDE: 109 meq/L (ref 98–109)
CO2: 22 mEq/L (ref 22–29)
CREATININE: 0.8 mg/dL (ref 0.6–1.1)
EGFR: >60 mL/min/{1.73_m2} (ref >60)
GLUCOSE: 98 mg/dL (ref 70–140)
POTASSIUM: 4.2 meq/L (ref 3.5–5.1)
SODIUM: 140 meq/L (ref 136–145)
Total Bilirubin: 0.35 mg/dL (ref 0.20–1.20)
Total Protein: 6.7 g/dL (ref 6.4–8.3)

## 2017-04-03 MED ORDER — SODIUM CHLORIDE 0.9 % IV SOLN
80.0000 mg/m2 | Freq: Once | INTRAVENOUS | Status: AC
  Administered 2017-04-03: 156 mg via INTRAVENOUS
  Filled 2017-04-03: qty 26

## 2017-04-03 MED ORDER — DEXAMETHASONE SODIUM PHOSPHATE 10 MG/ML IJ SOLN
10.0000 mg | Freq: Once | INTRAMUSCULAR | Status: AC
  Administered 2017-04-03: 10 mg via INTRAVENOUS

## 2017-04-03 MED ORDER — HEPARIN SOD (PORK) LOCK FLUSH 100 UNIT/ML IV SOLN
500.0000 [IU] | Freq: Once | INTRAVENOUS | Status: AC | PRN
  Administered 2017-04-03: 500 [IU]
  Filled 2017-04-03: qty 5

## 2017-04-03 MED ORDER — FAMOTIDINE IN NACL 20-0.9 MG/50ML-% IV SOLN
INTRAVENOUS | Status: AC
  Filled 2017-04-03: qty 50

## 2017-04-03 MED ORDER — FAMOTIDINE IN NACL 20-0.9 MG/50ML-% IV SOLN
20.0000 mg | Freq: Once | INTRAVENOUS | Status: AC
  Administered 2017-04-03: 20 mg via INTRAVENOUS

## 2017-04-03 MED ORDER — DEXAMETHASONE SODIUM PHOSPHATE 10 MG/ML IJ SOLN
INTRAMUSCULAR | Status: AC
  Filled 2017-04-03: qty 1

## 2017-04-03 MED ORDER — SODIUM CHLORIDE 0.9% FLUSH
10.0000 mL | Freq: Once | INTRAVENOUS | Status: AC
  Administered 2017-04-03: 10 mL
  Filled 2017-04-03: qty 10

## 2017-04-03 MED ORDER — SODIUM CHLORIDE 0.9 % IV SOLN
300.0000 mg | Freq: Once | INTRAVENOUS | Status: AC
  Administered 2017-04-03: 300 mg via INTRAVENOUS
  Filled 2017-04-03: qty 30

## 2017-04-03 MED ORDER — PALONOSETRON HCL INJECTION 0.25 MG/5ML
0.2500 mg | Freq: Once | INTRAVENOUS | Status: AC
  Administered 2017-04-03: 0.25 mg via INTRAVENOUS

## 2017-04-03 MED ORDER — SODIUM CHLORIDE 0.9% FLUSH
10.0000 mL | INTRAVENOUS | Status: DC | PRN
  Administered 2017-04-03: 10 mL
  Filled 2017-04-03: qty 10

## 2017-04-03 MED ORDER — DIPHENHYDRAMINE HCL 50 MG/ML IJ SOLN
INTRAMUSCULAR | Status: AC
  Filled 2017-04-03: qty 1

## 2017-04-03 MED ORDER — DIPHENHYDRAMINE HCL 50 MG/ML IJ SOLN
50.0000 mg | Freq: Once | INTRAMUSCULAR | Status: AC
  Administered 2017-04-03: 50 mg via INTRAVENOUS

## 2017-04-03 MED ORDER — PALONOSETRON HCL INJECTION 0.25 MG/5ML
INTRAVENOUS | Status: AC
  Filled 2017-04-03: qty 5

## 2017-04-03 MED ORDER — SODIUM CHLORIDE 0.9 % IV SOLN
Freq: Once | INTRAVENOUS | Status: AC
  Administered 2017-04-03: 12:00:00 via INTRAVENOUS

## 2017-04-03 NOTE — Patient Instructions (Signed)
[facility] Discharge Instructions for Patients Receiving Chemotherapy  Today you received the following chemotherapy agents Paclitaxel.   To help prevent nausea and vomiting after your treatment, we encourage you to take your nausea medication as prescribed.   If you develop nausea and vomiting that is not controlled by your nausea medication, call the clinic.   BELOW ARE SYMPTOMS THAT SHOULD BE REPORTED IMMEDIATELY:  *FEVER GREATER THAN 100.5 F  *CHILLS WITH OR WITHOUT FEVER  NAUSEA AND VOMITING THAT IS NOT CONTROLLED WITH YOUR NAUSEA MEDICATION  *UNUSUAL SHORTNESS OF BREATH  *UNUSUAL BRUISING OR BLEEDING  TENDERNESS IN MOUTH AND THROAT WITH OR WITHOUT PRESENCE OF ULCERS  *URINARY PROBLEMS  *BOWEL PROBLEMS  UNUSUAL RASH Items with * indicate a potential emergency and should be followed up as soon as possible.  Feel free to call the clinic should you have any questions or concerns. The clinic phone number is [phone].  Please show the [place] at check-in to the Emergency Department and triage nurse.

## 2017-04-05 ENCOUNTER — Encounter: Payer: Self-pay | Admitting: Hematology

## 2017-04-05 DIAGNOSIS — Z1509 Genetic susceptibility to other malignant neoplasm: Secondary | ICD-10-CM

## 2017-04-05 DIAGNOSIS — Z1501 Genetic susceptibility to malignant neoplasm of breast: Secondary | ICD-10-CM | POA: Insufficient documentation

## 2017-04-07 ENCOUNTER — Telehealth: Payer: Self-pay | Admitting: Hematology

## 2017-04-07 NOTE — Telephone Encounter (Signed)
Scheduled appt per 10/24 los - left message and sent reminder letter in the mail.

## 2017-04-08 ENCOUNTER — Telehealth: Payer: Self-pay | Admitting: *Deleted

## 2017-04-08 NOTE — Telephone Encounter (Signed)
-----   Message from Ma Hillock, RN sent at 04/03/2017  2:43 PM EDT ----- Regarding: [facility]: 1st chemo F/U Patient received first dose of carboplatin and tolerated the infusion without any difficulty. Please call patient to follow-up. She has already been getting taxol.

## 2017-04-08 NOTE — Telephone Encounter (Signed)
Called & spoke with pt regarding addition to chemo of carboplatin.  She reports no problems or concerns & has no questions.  She will return on 04/10/17 for f/u.

## 2017-04-10 ENCOUNTER — Ambulatory Visit: Payer: Medicaid Other

## 2017-04-10 ENCOUNTER — Ambulatory Visit (HOSPITAL_BASED_OUTPATIENT_CLINIC_OR_DEPARTMENT_OTHER): Payer: Medicaid Other | Admitting: Nurse Practitioner

## 2017-04-10 ENCOUNTER — Encounter: Payer: Self-pay | Admitting: Nurse Practitioner

## 2017-04-10 ENCOUNTER — Ambulatory Visit (HOSPITAL_BASED_OUTPATIENT_CLINIC_OR_DEPARTMENT_OTHER): Payer: Medicaid Other

## 2017-04-10 ENCOUNTER — Other Ambulatory Visit (HOSPITAL_BASED_OUTPATIENT_CLINIC_OR_DEPARTMENT_OTHER): Payer: Medicaid Other

## 2017-04-10 VITALS — BP 113/76 | HR 100 | Temp 98.5°F | Resp 18 | Ht 64.0 in | Wt 185.6 lb

## 2017-04-10 DIAGNOSIS — Z170 Estrogen receptor positive status [ER+]: Secondary | ICD-10-CM

## 2017-04-10 DIAGNOSIS — Z95828 Presence of other vascular implants and grafts: Secondary | ICD-10-CM

## 2017-04-10 DIAGNOSIS — Z1509 Genetic susceptibility to other malignant neoplasm: Secondary | ICD-10-CM

## 2017-04-10 DIAGNOSIS — N644 Mastodynia: Secondary | ICD-10-CM

## 2017-04-10 DIAGNOSIS — C50412 Malignant neoplasm of upper-outer quadrant of left female breast: Secondary | ICD-10-CM

## 2017-04-10 DIAGNOSIS — Z1501 Genetic susceptibility to malignant neoplasm of breast: Secondary | ICD-10-CM

## 2017-04-10 DIAGNOSIS — Z5111 Encounter for antineoplastic chemotherapy: Secondary | ICD-10-CM

## 2017-04-10 LAB — COMPREHENSIVE METABOLIC PANEL
ALBUMIN: 3.5 g/dL (ref 3.5–5.0)
ALK PHOS: 68 U/L (ref 40–150)
ALT: 46 U/L (ref 0–55)
ANION GAP: 10 meq/L (ref 3–11)
AST: 22 U/L (ref 5–34)
BILIRUBIN TOTAL: 0.37 mg/dL (ref 0.20–1.20)
BUN: 15.3 mg/dL (ref 7.0–26.0)
CO2: 23 mEq/L (ref 22–29)
Calcium: 9.2 mg/dL (ref 8.4–10.4)
Chloride: 107 mEq/L (ref 98–109)
Creatinine: 0.8 mg/dL (ref 0.6–1.1)
GLUCOSE: 120 mg/dL (ref 70–140)
POTASSIUM: 3.7 meq/L (ref 3.5–5.1)
SODIUM: 140 meq/L (ref 136–145)
TOTAL PROTEIN: 6.6 g/dL (ref 6.4–8.3)

## 2017-04-10 LAB — CBC WITH DIFFERENTIAL/PLATELET
BASO%: 1.1 % (ref 0.0–2.0)
Basophils Absolute: 0 10*3/uL (ref 0.0–0.1)
EOS ABS: 0.2 10*3/uL (ref 0.0–0.5)
EOS%: 4.3 % (ref 0.0–7.0)
HCT: 30.4 % — ABNORMAL LOW (ref 34.8–46.6)
HEMOGLOBIN: 10.1 g/dL — AB (ref 11.6–15.9)
LYMPH%: 14.9 % (ref 14.0–49.7)
MCH: 29.6 pg (ref 25.1–34.0)
MCHC: 33.2 g/dL (ref 31.5–36.0)
MCV: 89.1 fL (ref 79.5–101.0)
MONO#: 0.4 10*3/uL (ref 0.1–0.9)
MONO%: 9.6 % (ref 0.0–14.0)
NEUT%: 70.1 % (ref 38.4–76.8)
NEUTROS ABS: 2.6 10*3/uL (ref 1.5–6.5)
PLATELETS: 214 10*3/uL (ref 145–400)
RBC: 3.41 10*6/uL — AB (ref 3.70–5.45)
RDW: 16.1 % — AB (ref 11.2–14.5)
WBC: 3.8 10*3/uL — AB (ref 3.9–10.3)
lymph#: 0.6 10*3/uL — ABNORMAL LOW (ref 0.9–3.3)

## 2017-04-10 MED ORDER — DIPHENHYDRAMINE HCL 50 MG/ML IJ SOLN
50.0000 mg | Freq: Once | INTRAMUSCULAR | Status: AC
  Administered 2017-04-10: 50 mg via INTRAVENOUS

## 2017-04-10 MED ORDER — FAMOTIDINE IN NACL 20-0.9 MG/50ML-% IV SOLN
20.0000 mg | Freq: Once | INTRAVENOUS | Status: AC
  Administered 2017-04-10: 20 mg via INTRAVENOUS

## 2017-04-10 MED ORDER — CARBOPLATIN CHEMO INJECTION 450 MG/45ML
298.4000 mg | Freq: Once | INTRAVENOUS | Status: AC
  Administered 2017-04-10: 300 mg via INTRAVENOUS
  Filled 2017-04-10: qty 30

## 2017-04-10 MED ORDER — SODIUM CHLORIDE 0.9 % IV SOLN
Freq: Once | INTRAVENOUS | Status: AC
  Administered 2017-04-10: 11:00:00 via INTRAVENOUS

## 2017-04-10 MED ORDER — FAMOTIDINE IN NACL 20-0.9 MG/50ML-% IV SOLN
INTRAVENOUS | Status: AC
  Filled 2017-04-10: qty 50

## 2017-04-10 MED ORDER — DIPHENHYDRAMINE HCL 50 MG/ML IJ SOLN
INTRAMUSCULAR | Status: AC
  Filled 2017-04-10: qty 1

## 2017-04-10 MED ORDER — SODIUM CHLORIDE 0.9% FLUSH
10.0000 mL | INTRAVENOUS | Status: DC | PRN
  Administered 2017-04-10: 10 mL
  Filled 2017-04-10: qty 10

## 2017-04-10 MED ORDER — DEXAMETHASONE SODIUM PHOSPHATE 10 MG/ML IJ SOLN
10.0000 mg | Freq: Once | INTRAMUSCULAR | Status: AC
  Administered 2017-04-10: 10 mg via INTRAVENOUS

## 2017-04-10 MED ORDER — DEXAMETHASONE SODIUM PHOSPHATE 10 MG/ML IJ SOLN
INTRAMUSCULAR | Status: AC
  Filled 2017-04-10: qty 1

## 2017-04-10 MED ORDER — SODIUM CHLORIDE 0.9% FLUSH
10.0000 mL | Freq: Once | INTRAVENOUS | Status: AC
  Administered 2017-04-10: 10 mL
  Filled 2017-04-10: qty 10

## 2017-04-10 MED ORDER — PALONOSETRON HCL INJECTION 0.25 MG/5ML
0.2500 mg | Freq: Once | INTRAVENOUS | Status: AC
  Administered 2017-04-10: 0.25 mg via INTRAVENOUS

## 2017-04-10 MED ORDER — HEPARIN SOD (PORK) LOCK FLUSH 100 UNIT/ML IV SOLN
500.0000 [IU] | Freq: Once | INTRAVENOUS | Status: AC | PRN
  Administered 2017-04-10: 500 [IU]
  Filled 2017-04-10: qty 5

## 2017-04-10 MED ORDER — PALONOSETRON HCL INJECTION 0.25 MG/5ML
INTRAVENOUS | Status: AC
  Filled 2017-04-10: qty 5

## 2017-04-10 MED ORDER — PACLITAXEL CHEMO INJECTION 300 MG/50ML
80.0000 mg/m2 | Freq: Once | INTRAVENOUS | Status: AC
  Administered 2017-04-10: 156 mg via INTRAVENOUS
  Filled 2017-04-10: qty 26

## 2017-04-10 NOTE — Progress Notes (Signed)
Kathryn Stanley  [phone] [phone]  Clinic Follow-up Note   Patient Care Team: [name], MD as PCP - General (Internal Medicine) [name], MD as Consulting Physician (General Surgery) [name], MD as Consulting Physician (Hematology) [name], MD as Consulting Physician (Radiation Oncology) 04/10/2017  CHIEF COMPLAINTS:  Follow-up for Malignant neoplasm of upper-outer quadrant of left breast in female, estrogen receptor positive   Oncology History   Cancer Staging Malignant neoplasm of upper-outer quadrant of left breast in female, estrogen receptor positive ([place]) Staging form: Breast, AJCC 8th Edition - Clinical stage from 12/29/2016: Stage IIIA (cT2, cN1, cM0, G3, ER: Positive, PR: Negative, HER2: Negative) - Signed by [name], MD on 01/05/2017       Malignant neoplasm of upper-outer quadrant of left breast in female, estrogen receptor positive ([place])   12/25/2016 Mammogram    [place] and [facility] Diagnostic Breast Tomo Bilateral 12/25/16 IMPRESSION: 1. Suspicious mass 2.3 x 1.6 x 3.0 cm  in the 130 o'clock location of the left breast 8cm from the nipple, biopsy recommended. Adjacent simple cyst is 1.9 cm. 2. Suspicious left axillary lymph node 2.2cm for which biopsy is indicated. 3. Indeterminate group of calcifications in the upper central portion of the left breast for which biopsy is recommended.       12/29/2016 Initial Biopsy    Diagnosis 12/29/16 1. Breast, left, needle core biopsy, stereotactic, upper inner quadrant - FIBROCYSTIC CHANGES INCLUDING APOCRINE METAPLASIA WITH CALCIFICATIONS - NO MALIGNANCY IDENTIFIED 2. Breast, left, needle core biopsy, ultrasound, 1:30 o'clock - INVASIVE MAMMARY CARCINOMA, G3 - SEE COMMENT 3. Lymph node, needle/core biopsy, ultrasound, left axillary - METASTIC CARCINOMA INVOLVING ONE LYMPH NODE (1/1) E-cadherin is positive supporting a ductal origin.       12/29/2016 Receptors her2    ER  15%+, weak staining  PR - HER2- Ki67 10%       12/29/2016 Initial Diagnosis    Malignant neoplasm of upper-outer quadrant of left breast in female, estrogen receptor positive ([place])      01/13/2017 Echocardiogram    ECHO 01/13/17 Study Conclusions - Left ventricle: The cavity size was normal. Systolic function was   normal. The estimated ejection fraction was in the range of 60%   to 65%. Wall motion was normal; there were no regional wall   motion abnormalities. Left ventricular diastolic function   parameters were normal. - Mitral valve: There was trivial regurgitation. - Right atrium: The atrium was mildly dilated. - Tricuspid valve: There was trivial regurgitation.       01/14/2017 Surgery    Port placement by Dr. Donne Hazel       01/15/2017 Imaging    Whole Body bone Scan 01/15/17 IMPRESSION: 1.  No significant abnormality identified.      01/15/2017 Imaging    CT CAP W Contrast 01/15/17 IMPRESSION: 1. Left lateral breast mass with pathologic left axillary adenopathy including a 2.0 cm left axillary lymph node. 2. Several hypodense lesions in the liver are likely benign lesions such as cysts. However, these are technically too small to characterize by CT. Possibilities for further workup include surveillance or hepatic protocol MRI with and without contrast. 3. There is also a tiny hypodense lesion in the spleen which is technically nonspecific but statistically highly likely to be benign. 4.  Prominent stool throughout the colon favors constipation      01/16/2017 -  Chemotherapy    Neoadjuvant chemo AC every 2 weeks for 4 cycles starting on 01/16/2017 and ended  02/27/17. 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   Analysis also detected a Variant of Uncertain Significance (VUS) in the NBN gene called c.628G>T  (p.Val210Phe)        HISTORY OF PRESENTING ILLNESS: 01/07/17 Kathryn Stanley 41 y.o. female is here because of newly diagnosed Malignant neoplasm of upper-outer quadrant of left breast in female, estrogen receptor positive. She presents to the breast clinic today with her common law husband. She felt the lump herself 3 months ago. She felt pain like "stabbing" first. She then had a mammogram 12/25/16 and subsequent biopsy   In the past she was using smoking match and quit smoking "cold [place]" that was 1 month ago.   Today she reports her breast pain still comes and goes. She has not noticed any new changes. 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 Lately has regular period. She has a weak cervix and her children were born premature. She is currently working at [employer] and works on her feet all the time, working 5 days a week.   GYN HISTORY  Menarchal: 12 LMP: 12/10/16 Contraceptive: No HRT: NA GP: G2P1 - twins    CURRENT THERAPY: Neoadjuvant chemo AC every 2 weeks for 4 cycles starting on 01/16/2017 and ended 02/27/17. Then proceed with weekly taxol for 12 treatments starting 03/13/17, added Carboplatin AUC 2 on 04/03/17 to weekly taxol.   INTERVAL HISTORY:  Kathryn Stanley is here for a follow-up and cycle 4 Taxol. She presents to the clinic with her mother today.  She did well with the addition of Carboplatin and was her usual fatigue, no change in side effects. She notes her finger had a spasm. She notes to keeping up her weight and eating well. She will see Dr. Donne Hazel as she will proceed with bilateral mastectomy.     MEDICAL HISTORY:  Past Medical History:  Diagnosis Date  . Cancer [facility])    left breast cancer  . Dermatologic problem    possible psoriasis. Pt has not had been diagnosed by dermatology.  . 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 GERD (gastroesophageal reflux disease)   . Tobacco dependence    trying to quit with nicotene patches    SURGICAL HISTORY: Past Surgical History:  Procedure Laterality Date  . CESAREAN SECTION    . PORTACATH PLACEMENT N/A 01/14/2017   Procedure: INSERTION PORT-A-CATH WITH [place];  Surgeon: [name], MD;  Location: [place];  Service: General;  Laterality: N/A;    SOCIAL HISTORY: Social History   Social History  . Marital status: Married    Spouse name: N/A  . Number of children: N/A  . Years of education: N/A   Occupational History  . Not on file.   Social History Main Topics  . Smoking status: Former Smoker    Packs/day: 1.00    Years: 17.00    Types: Cigarettes    Quit date: 12/15/2016  . Smokeless tobacco: Never Used  . Alcohol use No  . Drug use: No  . Sexual activity: Yes    Birth control/ protection: None   Other Topics Concern  . Not on file   Social History Narrative  .  No narrative on file    FAMILY HISTORY: Family History  Problem Relation Age of Onset  . Stroke Maternal Grandmother   . Stroke Father   . Hypertension Father   . Other Father        Adopted    ALLERGIES:  is allergic to chlorhexidine gluconate; amoxicillin; and nicotine.  MEDICATIONS:  Current Outpatient Prescriptions  Medication Sig Dispense Refill  . lidocaine-prilocaine (EMLA) cream Apply to affected area once 30 g 3  . famotidine (PEPCID) 10 MG tablet Take 10 mg by mouth daily as needed for heartburn or indigestion.    Marland Kitchen ibuprofen (ADVIL,MOTRIN) 200 MG tablet Take 200-400 mg by mouth daily as needed for headache or moderate pain.    Marland Kitchen ondansetron (ZOFRAN) 8 MG tablet Take 1 tablet (8 mg total) by mouth 2 (two) times daily as needed. Start on the third day after chemotherapy. (Patient not taking: Reported on 02/27/2017) 30 tablet 1  . prochlorperazine (COMPAZINE) 10 MG tablet TAKE 1 TABLET EVERY 6 HOURS AS NEEDED FOR NAUSEA OR VOMITING (Patient not taking:  Reported on 02/27/2017) 30 tablet 2  . traMADol (ULTRAM) 50 MG tablet Take 1 tablet (50 mg total) by mouth every 6 (six) hours as needed. (Patient not taking: Reported on 03/13/2017) 12 tablet 1   No current facility-administered medications for this visit.    Facility-Administered Medications Ordered in Other Visits  Medication Dose Route Frequency Provider Last Rate Last Dose  . sodium chloride flush (NS) 0.9 % injection 10 mL  10 mL Intracatheter PRN [name], MD   10 mL at 04/10/17 1454   REVIEW OF SYSTEMS:   Constitutional: Denies abnormal night sweats.  (+) complete hair loss (+) fatigue  Eyes: Denies blurriness of vision, double vision or watery eyes Ears, nose, mouth, throat, and face: Denies mucositis or sore throat Respiratory: Denies dyspnea or wheezes   Cardiovascular: Denies palpitation, chest discomfort or lower extremity swelling Gastrointestinal:  Denies nausea, heartburn.  Skin: Denies abnormal skin rashes Lymphatics: Denies new lymphadenopathy or easy bruising Neurological:Denies numbness, tingling or new weaknesses MSK: negative Breast: (+) occasional shooting pain in left breast, palpable mass in left breast, now smaller and softer Behavioral/Psych: Mood is stable, no new changes  All other systems were reviewed with the patient and are negative.  PHYSICAL EXAMINATION:  ECOG PERFORMANCE STATUS: 1  Vitals:   04/10/17 1022  BP: 113/76  Pulse: 100  Resp: 18  Temp: 98.5 F (36.9 C)  SpO2: 100%   Filed Weights   04/10/17 1022  Weight: 185 lb 9.6 oz (84.2 kg)    GENERAL:alert, no distress and comfortable SKIN: skin color, texture, turgor are normal, no rashes or significant lesions EYES: normal, conjunctiva are pink and non-injected, sclera clear OROPHARYNX:no exudate, no erythema and lips, buccal mucosa, and tongue normal  NECK: supple, thyroid normal size, non-tender, without nodularity LYMPH:  no palpable lymphadenopathy in the cervical, axillary or  inguinal LUNGS: clear to auscultation and percussion with normal breathing effort HEART: regular rate & rhythm and no murmurs and no lower extremity edema ABDOMEN:abdomen soft, non-tender and normal bowel sounds Musculoskeletal:no cyanosis of digits and no clubbing  PSYCH: alert & oriented x 3 with fluent speech NEURO: no focal motor/sensory deficits Breast: (+) previous 1 cm,  now 0.8 cm mass in UOQ of left breast is softer than before. (+) no longer palpable lymph node in left axilary that was previously about 1 cm.  LABORATORY DATA:  I have reviewed the data as listed  CBC Latest Ref Rng & Units 04/10/2017 04/03/2017 03/27/2017  WBC 3.9 - 10.3 10e3/uL 3.8(L) 5.5 5.5  Hemoglobin 11.6 - 15.9 g/dL 10.1(L) 10.0(L) 9.9(L)  Hematocrit 34.8 - 46.6 % 30.4(L) 30.3(L) 28.8(L)  Platelets 145 - 400 10e3/uL 214 261 241    CMP Latest Ref Rng & Units 04/10/2017 04/03/2017 03/27/2017  Glucose 70 - 140 mg/dl 120 98 132  BUN 7.0 - 26.0 mg/dL 15.3 15.3 13.4  Creatinine 0.6 - 1.1 mg/dL 0.8 0.8 0.8  Sodium 136 - 145 mEq/L 140 140 139  Potassium 3.5 - 5.1 mEq/L 3.7 4.2 3.5  Chloride 101 - 111 mmol/L - - -  CO2 22 - 29 mEq/L 23 22 23   Calcium 8.4 - 10.4 mg/dL 9.2 9.2 9.0  Total Protein 6.4 - 8.3 g/dL 6.6 6.7 6.5  Total Bilirubin 0.20 - 1.20 mg/dL 0.37 0.35 0.40  Alkaline Phos 40 - 150 U/L 68 82 80  AST 5 - 34 U/L 22 28 25   ALT 0 - 55 U/L 46 72(H) 58(H)     PATHOLOGY  Diagnosis 12/29/16 1. Breast, left, needle core biopsy, stereotactic, upper inner quadrant - FIBROCYSTIC CHANGES INCLUDING APOCRINE METAPLASIA WITH CALCIFICATIONS - NO MALIGNANCY IDENTIFIED 2. Breast, left, needle core biopsy, ultrasound, 1:30 o'clock - INVASIVE MAMMARY CARCINOMA - SEE COMMENT 3. Lymph node, needle/core biopsy, ultrasound, left axillary - METASTIC CARCINOMA INVOLVING ONE LYMPH NODE (1/1) Microscopic Comment 2. The biopsy material shows an infiltrative proliferation of cells with large vesicular nuclei with  conspicuous nucleoli, arranged linearly and in small clusters. There are scattered cells with marked pleomorphism and cytologic atypia. Based on the biopsy, the carcinoma appears Nottingham grade 3 of 3 and measures 0.9 cm in greatest linear extent. E-cadherin and prognostic markers (ER/PR/ki-67/HER2-FISH)are pending and will be reported in an addendum. Dr. Tresa Moore reviewed the case and agrees with the above diagnosis. This case was called to [facility] on December 30, 2016. Results: IMMUNOHISTOCHEMICAL AND MORPHOMETRIC ANALYSIS PERFORMED MANUALLY Estrogen Receptor: 15%, POSITIVE, WEAK STAINING INTENSITY Progesterone Receptor: 0%, NEGATIVE Proliferation Marker Ki67: 10% COMMENT: The negative hormone receptor study(ies) in this case has An internal positive control. Results: HER2 - NEGATIVE RATIO OF HER2/CEP17 SIGNALS 1.63 AVERAGE HER2 COPY NUMBER PER CELL 3.10   PROCEDURES  ECHO 01/13/17 Study Conclusions - Left ventricle: The cavity size was normal. Systolic function was   normal. The estimated ejection fraction was in the range of 60%   to 65%. Wall motion was normal; there were no regional wall   motion abnormalities. Left ventricular diastolic function   parameters were normal. - Mitral valve: There was trivial regurgitation. - Right atrium: The atrium was mildly dilated. - Tricuspid valve: There was trivial regurgitation.  RADIOGRAPHIC STUDIES: I have personally reviewed the radiological images as listed and agreed with the findings in the report. No results found. ASSESSMENT & PLAN:  Kathryn Stanley is a 41 y.o. premenopausal female with no significant past medical history, presented with a palpable left breast mass.  1. Malignant neoplasm of upper-outer quadrant of left breast in female, ductal carcinoma, cT2N1M0, Stage IIIa, ER weakly +, PR - , HER2 - , Grade 3 -We reviewed her mammogram and initial biopsy results in details with patient and her husband.    -Her tumor is weakly ER positive, PR negative, HER-2 negative, grade 3, she has positive lymph nodes, those are all high risks for recurrence. I recommend neoadjuvant or adjuvant chemotherapy to reduce her risk of recurrence after surgery. -We discussed  that the benefit of neoadjuvant chemotherapy, to shrink the tumor and lymph nodes, and potentially she would be a candidate for lumpectomy and may not need lymph node dissection if she has good response. I have discussed this with her surgeon Dr. Donne Hazel today, both of [place] recommend her to consider neoadjuvant chemotherapy. She agrees.  -we reviewed her staging scan CT from 01/15/17 in person. A few hypodense lesion were seen in the liver, appear to be benign cysts. CT and bone scan otherwise negative for distant metastasis. 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 This was discussed with patient. -I recommended neoadjuvant chemotherapy with dose dense Adriamycin and Cytoxan (AC) every 2 weeks for 4 cycles, followed by weekly Taxol for 12 weeks. She agreed. -She completed AC treatment 01/16/17-02/27/17. She tolerated well other than bone pain and constipation. She has had good clinical responce to treatment as her tumor is slightly smaller and her lymph node is no longer palpable upon 03/13/17 breast exam.  -She starts her weekly taxol 03/13/17, tolerating well so far -I discussed adding Carboplatin to her weekly taxol since her genetic testing is positive for BRCA1 mutation. I discussed the side effects and the benefits to helping further shrink her tumor. I explained that BRCA mutated breast cancer is more sensitive to platinum. This would be in attempt to achieve better or even complete response from chemotherapy. -I previously discussed her BRCA1 positive results. I explained the prophylactic steps and she agreed to proceed with  bilateral oophorectomy and mastectomy. She is interested in breast reconstruction post mastectomy. -We added  Carboplatin with cycle 4 on 04/03/17. She tolerated well. I discussed this drug may lower her blood counts and if needed we can give IV fluids or hold Carbo.  -F/u with me in 2 week with with lacie in 4 weeks.  -Labs reviewed, her HG is stable and her WBC is trending slightly low. May need to hold Carbo in a few weeks. Labs adequate to proceed with CT today.    2. Genetics: BRCA1 + -Due to her young age of diagnosis I suggested she get genetic testing. 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  -She is agreeable with bilateral oophorectomy and mastectomy, she is scheduled to see her breast surgeon Dr. Donne Hazel in a few weeks to discuss bilateral mastectomy and reconstruction.  -I explained when her children get older they need to get tested and start cancer screenings earlier.   3. Financial Support -They are not insured and are attempting to get an orange card.  -They have met our financial support/social worker  4. Cold symptoms  -developed week of 03/13/17 with low grade fever, body aches, chills, headache, and dry cough.  -Will hold flu vaccination until resolved.  -She would like to proceed with chemotherapy today as labs are adequate. -I highly suggested she contacts [place] if her fever reaches 100.4 F or above. I encouraged her to stay away from certain environments until this resolves.  -She is fine to take advil, tylenol  -Will defer flu shot until symptoms resolve -Symptoms much improved since Zpac, only slight dry cough remains -She is fine to proceed with Flu vaccination today     PLAN:  -Labs reviewed and adequate to proceed with [place] and Taxol (CT) today, continue weekly -f/u in 2 weeks  All questions were answered. The patient knows to call the clinic  with any problems, questions or concerns. I spent 15 minutes counseling the patient face to face. The total time spent in the appointment was 20 minutes and more than 50% was on counseling.  This document serves as a record of services personally performed by [name], MD. It was created on her behalf by Joslyn Devon, a trained medical scribe. The creation of this record is based on the scribe's personal observations and the provider's statements to them. This document has been checked and approved by the attending provider.     [name], MD 04/10/2017 5:29 PM

## 2017-04-10 NOTE — Patient Instructions (Signed)
   [facility] Discharge Instructions for Patients Receiving Chemotherapy  Today you received the following chemotherapy agents Taxol and Carboplatin   To help prevent nausea and vomiting after your treatment, we encourage you to take your nausea medication as directed.    If you develop nausea and vomiting that is not controlled by your nausea medication, call the clinic.   BELOW ARE SYMPTOMS THAT SHOULD BE REPORTED IMMEDIATELY:  *FEVER GREATER THAN 100.5 F  *CHILLS WITH OR WITHOUT FEVER  NAUSEA AND VOMITING THAT IS NOT CONTROLLED WITH YOUR NAUSEA MEDICATION  *UNUSUAL SHORTNESS OF BREATH  *UNUSUAL BRUISING OR BLEEDING  TENDERNESS IN MOUTH AND THROAT WITH OR WITHOUT PRESENCE OF ULCERS  *URINARY PROBLEMS  *BOWEL PROBLEMS  UNUSUAL RASH Items with * indicate a potential emergency and should be followed up as soon as possible.  Feel free to call the clinic should you have any questions or concerns. The clinic phone number is [phone].  Please show the CHEMO ALERT CARD at check-in to the Emergency Department and triage nurse.   

## 2017-04-17 ENCOUNTER — Encounter (HOSPITAL_COMMUNITY): Payer: Self-pay | Admitting: Cardiology

## 2017-04-17 ENCOUNTER — Ambulatory Visit (HOSPITAL_BASED_OUTPATIENT_CLINIC_OR_DEPARTMENT_OTHER): Payer: Medicaid Other

## 2017-04-17 ENCOUNTER — Ambulatory Visit: Payer: Medicaid Other

## 2017-04-17 ENCOUNTER — Ambulatory Visit (HOSPITAL_BASED_OUTPATIENT_CLINIC_OR_DEPARTMENT_OTHER)
Admission: RE | Admit: 2017-04-17 | Discharge: 2017-04-17 | Disposition: A | Discharge status: Home / Self Care | Payer: Medicaid Other | Source: Ambulatory Visit | Attending: Cardiology | Admitting: Cardiology

## 2017-04-17 ENCOUNTER — Ambulatory Visit (HOSPITAL_COMMUNITY)
Admission: RE | Admit: 2017-04-17 | Discharge: 2017-04-17 | Disposition: A | Discharge status: Home / Self Care | Payer: Medicaid Other | Source: Ambulatory Visit | Attending: Cardiology | Admitting: Cardiology

## 2017-04-17 ENCOUNTER — Other Ambulatory Visit (HOSPITAL_COMMUNITY): Payer: Self-pay

## 2017-04-17 ENCOUNTER — Other Ambulatory Visit (HOSPITAL_BASED_OUTPATIENT_CLINIC_OR_DEPARTMENT_OTHER): Payer: Medicaid Other

## 2017-04-17 VITALS — BP 129/86 | HR 95 | Wt 190.8 lb

## 2017-04-17 DIAGNOSIS — Z170 Estrogen receptor positive status [ER+]: Principal | ICD-10-CM

## 2017-04-17 DIAGNOSIS — Z823 Family history of stroke: Secondary | ICD-10-CM | POA: Insufficient documentation

## 2017-04-17 DIAGNOSIS — C50412 Malignant neoplasm of upper-outer quadrant of left female breast: Secondary | ICD-10-CM

## 2017-04-17 DIAGNOSIS — Z9889 Other specified postprocedural states: Secondary | ICD-10-CM | POA: Diagnosis not present

## 2017-04-17 DIAGNOSIS — Z8249 Family history of ischemic heart disease and other diseases of the circulatory system: Secondary | ICD-10-CM | POA: Diagnosis not present

## 2017-04-17 DIAGNOSIS — Z87891 Personal history of nicotine dependence: Secondary | ICD-10-CM | POA: Insufficient documentation

## 2017-04-17 DIAGNOSIS — C50011 Malignant neoplasm of nipple and areola, right female breast: Secondary | ICD-10-CM | POA: Diagnosis not present

## 2017-04-17 DIAGNOSIS — Z95828 Presence of other vascular implants and grafts: Secondary | ICD-10-CM

## 2017-04-17 DIAGNOSIS — Z79899 Other long term (current) drug therapy: Secondary | ICD-10-CM | POA: Insufficient documentation

## 2017-04-17 DIAGNOSIS — Z5111 Encounter for antineoplastic chemotherapy: Secondary | ICD-10-CM | POA: Diagnosis present

## 2017-04-17 LAB — COMPREHENSIVE METABOLIC PANEL
ALT: 52 U/L (ref 0–55)
ANION GAP: 8 meq/L (ref 3–11)
AST: 31 U/L (ref 5–34)
Albumin: 3.7 g/dL (ref 3.5–5.0)
Alkaline Phosphatase: 70 U/L (ref 40–150)
BUN: 15.6 mg/dL (ref 7.0–26.0)
CALCIUM: 9.1 mg/dL (ref 8.4–10.4)
CHLORIDE: 108 meq/L (ref 98–109)
CO2: 24 meq/L (ref 22–29)
CREATININE: 0.7 mg/dL (ref 0.6–1.1)
EGFR: >60 mL/min/{1.73_m2} (ref >60)
Glucose: 94 mg/dl (ref 70–140)
POTASSIUM: 3.7 meq/L (ref 3.5–5.1)
Sodium: 140 mEq/L (ref 136–145)
Total Bilirubin: 0.44 mg/dL (ref 0.20–1.20)
Total Protein: 6.6 g/dL (ref 6.4–8.3)

## 2017-04-17 LAB — CBC WITH DIFFERENTIAL/PLATELET
BASO%: 0.7 % (ref 0.0–2.0)
BASOS ABS: 0 10*3/uL (ref 0.0–0.1)
EOS ABS: 0 10*3/uL (ref 0.0–0.5)
EOS%: 1.1 % (ref 0.0–7.0)
HEMATOCRIT: 29.6 % — AB (ref 34.8–46.6)
HEMOGLOBIN: 9.9 g/dL — AB (ref 11.6–15.9)
LYMPH%: 27.8 % (ref 14.0–49.7)
MCH: 30 pg (ref 25.1–34.0)
MCHC: 33.4 g/dL (ref 31.5–36.0)
MCV: 89.7 fL (ref 79.5–101.0)
MONO#: 0.4 10*3/uL (ref 0.1–0.9)
MONO%: 12.8 % (ref 0.0–14.0)
NEUT#: 1.6 10*3/uL (ref 1.5–6.5)
NEUT%: 57.6 % (ref 38.4–76.8)
Platelets: 176 10*3/uL (ref 145–400)
RBC: 3.3 10*6/uL — ABNORMAL LOW (ref 3.70–5.45)
RDW: 16.2 % — AB (ref 11.2–14.5)
WBC: 2.7 10*3/uL — AB (ref 3.9–10.3)
lymph#: 0.8 10*3/uL — ABNORMAL LOW (ref 0.9–3.3)

## 2017-04-17 MED ORDER — DEXAMETHASONE SODIUM PHOSPHATE 10 MG/ML IJ SOLN
INTRAMUSCULAR | Status: AC
  Filled 2017-04-17: qty 1

## 2017-04-17 MED ORDER — DEXAMETHASONE SODIUM PHOSPHATE 10 MG/ML IJ SOLN
10.0000 mg | Freq: Once | INTRAMUSCULAR | Status: AC
  Administered 2017-04-17: 10 mg via INTRAVENOUS

## 2017-04-17 MED ORDER — DIPHENHYDRAMINE HCL 50 MG/ML IJ SOLN
INTRAMUSCULAR | Status: AC
  Filled 2017-04-17: qty 1

## 2017-04-17 MED ORDER — SODIUM CHLORIDE 0.9 % IV SOLN
Freq: Once | INTRAVENOUS | Status: AC
  Administered 2017-04-17: 15:00:00 via INTRAVENOUS

## 2017-04-17 MED ORDER — DIPHENHYDRAMINE HCL 50 MG/ML IJ SOLN
50.0000 mg | Freq: Once | INTRAMUSCULAR | Status: AC
  Administered 2017-04-17: 50 mg via INTRAVENOUS

## 2017-04-17 MED ORDER — SODIUM CHLORIDE 0.9% FLUSH
10.0000 mL | INTRAVENOUS | Status: DC | PRN
  Administered 2017-04-17: 10 mL
  Filled 2017-04-17: qty 10

## 2017-04-17 MED ORDER — SODIUM CHLORIDE 0.9% FLUSH
10.0000 mL | Freq: Once | INTRAVENOUS | Status: AC
  Administered 2017-04-17: 10 mL
  Filled 2017-04-17: qty 10

## 2017-04-17 MED ORDER — SODIUM CHLORIDE 0.9 % IV SOLN
80.0000 mg/m2 | Freq: Once | INTRAVENOUS | Status: AC
  Administered 2017-04-17: 156 mg via INTRAVENOUS
  Filled 2017-04-17: qty 26

## 2017-04-17 MED ORDER — PALONOSETRON HCL INJECTION 0.25 MG/5ML
INTRAVENOUS | Status: AC
  Filled 2017-04-17: qty 5

## 2017-04-17 MED ORDER — FAMOTIDINE IN NACL 20-0.9 MG/50ML-% IV SOLN
INTRAVENOUS | Status: AC
  Filled 2017-04-17: qty 50

## 2017-04-17 MED ORDER — SODIUM CHLORIDE 0.9 % IV SOLN
298.4000 mg | Freq: Once | INTRAVENOUS | Status: AC
  Administered 2017-04-17: 300 mg via INTRAVENOUS
  Filled 2017-04-17: qty 30

## 2017-04-17 MED ORDER — FAMOTIDINE IN NACL 20-0.9 MG/50ML-% IV SOLN
20.0000 mg | Freq: Once | INTRAVENOUS | Status: AC
  Administered 2017-04-17: 20 mg via INTRAVENOUS

## 2017-04-17 MED ORDER — PALONOSETRON HCL INJECTION 0.25 MG/5ML
0.2500 mg | Freq: Once | INTRAVENOUS | Status: AC
  Administered 2017-04-17: 0.25 mg via INTRAVENOUS

## 2017-04-17 MED ORDER — HEPARIN SOD (PORK) LOCK FLUSH 100 UNIT/ML IV SOLN
500.0000 [IU] | Freq: Once | INTRAVENOUS | Status: AC | PRN
  Administered 2017-04-17: 500 [IU]
  Filled 2017-04-17: qty 5

## 2017-04-17 NOTE — Progress Notes (Signed)
  Echocardiogram 2D Echocardiogram has been performed.  Kathryn Stanley 04/17/2017, 10:54 AM

## 2017-04-17 NOTE — Patient Instructions (Signed)
Implanted Port Home Guide An implanted port is a type of central line that is placed under the skin. Central lines are used to provide IV access when treatment or nutrition needs to be given through a person's veins. Implanted ports are used for long-term IV access. An implanted port may be placed because:  You need IV medicine that would be irritating to the small veins in your hands or arms.  You need long-term IV medicines, such as antibiotics.  You need IV nutrition for a long period.  You need frequent blood draws for lab tests.  You need dialysis.  Implanted ports are usually placed in the chest area, but they can also be placed in the upper arm, the abdomen, or the leg. An implanted port has two main parts:  Reservoir. The reservoir is round and will appear as a small, raised area under your skin. The reservoir is the part where a needle is inserted to give medicines or draw blood.  Catheter. The catheter is a thin, flexible tube that extends from the reservoir. The catheter is placed into a large vein. Medicine that is inserted into the reservoir goes into the catheter and then into the vein.  How will I care for my incision site? Do not get the incision site wet. Bathe or shower as directed by your health care provider. How is my port accessed? Special steps must be taken to access the port:  Before the port is accessed, a numbing cream can be placed on the skin. This helps numb the skin over the port site.  Your health care provider uses a sterile technique to access the port. ? Your health care provider must put on a mask and sterile gloves. ? The skin over your port is cleaned carefully with an antiseptic and allowed to dry. ? The port is gently pinched between sterile gloves, and a needle is inserted into the port.  Only "non-coring" port needles should be used to access the port. Once the port is accessed, a blood return should be checked. This helps ensure that the port  is in the vein and is not clogged.  If your port needs to remain accessed for a constant infusion, a clear (transparent) bandage will be placed over the needle site. The bandage and needle will need to be changed every week, or as directed by your health care provider.  Keep the bandage covering the needle clean and dry. Do not get it wet. Follow your health care provider's instructions on how to take a shower or bath while the port is accessed.  If your port does not need to stay accessed, no bandage is needed over the port.  What is flushing? Flushing helps keep the port from getting clogged. Follow your health care provider's instructions on how and when to flush the port. Ports are usually flushed with saline solution or a medicine called heparin. The need for flushing will depend on how the port is used.  If the port is used for intermittent medicines or blood draws, the port will need to be flushed: ? After medicines have been given. ? After blood has been drawn. ? As part of routine maintenance.  If a constant infusion is running, the port may not need to be flushed.  How long will my port stay implanted? The port can stay in for as long as your health care provider thinks it is needed. When it is time for the port to come out, surgery will be   done to remove it. The procedure is similar to the one performed when the port was put in. When should I seek immediate medical care? When you have an implanted port, you should seek immediate medical care if:  You notice a bad smell coming from the incision site.  You have swelling, redness, or drainage at the incision site.  You have more swelling or pain at the port site or the surrounding area.  You have a fever that is not controlled with medicine.  This information is not intended to replace advice given to you by your health care provider. Make sure you discuss any questions you have with your health care provider. Document  Released: 05/26/2005 Document Revised: 11/01/2015 Document Reviewed: 01/31/2013 Elsevier Interactive Patient Education  2017 [employer]  

## 2017-04-19 NOTE — Progress Notes (Signed)
Oncology: Dr. Burr Medico  41 yo referred by Dr. Burr Medico for cardio-oncology evaluation.  She was diagnosed 7/18 with left breast cancer, ER+/PR-/HER2-, lymph node positive. BRCA1+. Chemo starting 8/18 with Adriamcyin/Cytoxan x 4 cycles then Taxol x 12.  She will have bilateral mastectomy and oophorectomy.   She has no known cardiac problems, no known family history of cardiac disease.  Symptomatically stable with no chest pain or exertional dyspnea.    PMH:  1. Breast cancer: Diagnosed 7/18 with left breast cancer, ER+/PR-/HER2-, lymph node positive. BRCA1+. Chemo starting 8/18 with Adriamcyin/Cytoxan x 4 cycles then Taxol x 12.  She will have bilateral mastectomy and oophorectomy.  - Echo (8/18): EF 60-65%.  - Echo (11/18): EF 33-29%, normal diastolic function, GLS -51%, normal RV size and systolic function.   Social History   Socioeconomic History  . Marital status: Married    Spouse name: Not on file  . Number of children: Not on file  . Years of education: Not on file  . Highest education level: Not on file  Social Needs  . Financial resource strain: Not on file  . Food insecurity - worry: Not on file  . Food insecurity - inability: Not on file  . Transportation needs - medical: Not on file  . Transportation needs - non-medical: Not on file  Occupational History  . Not on file  Tobacco Use  . Smoking status: Former Smoker    Packs/day: 1.00    Years: 17.00    Pack years: 17.00    Types: Cigarettes    Last attempt to quit: 12/15/2016    Years since quitting: 0.3  . Smokeless tobacco: Never Used  Substance and Sexual Activity  . Alcohol use: No  . Drug use: No  . Sexual activity: Yes    Birth control/protection: None  Other Topics Concern  . Not on file  Social History Narrative  . Not on file   Family History  Problem Relation Age of Onset  . Stroke Maternal Grandmother   . Stroke Father   . Hypertension Father   . Other Father        Adopted   ROS: All systems reviewed  and negative except as per HPI.   Current Outpatient Medications  Medication Sig Dispense Refill  . ibuprofen (ADVIL,MOTRIN) 200 MG tablet Take 200-400 mg by mouth daily as needed for headache or moderate pain.    . famotidine (PEPCID) 10 MG tablet Take 10 mg by mouth daily as needed for heartburn or indigestion.    . lidocaine-prilocaine (EMLA) cream Apply to affected area once (Patient not taking: Reported on 04/17/2017) 30 g 3  . ondansetron (ZOFRAN) 8 MG tablet Take 1 tablet (8 mg total) by mouth 2 (two) times daily as needed. Start on the third day after chemotherapy. (Patient not taking: Reported on 02/27/2017) 30 tablet 1  . prochlorperazine (COMPAZINE) 10 MG tablet TAKE 1 TABLET EVERY 6 HOURS AS NEEDED FOR NAUSEA OR VOMITING (Patient not taking: Reported on 02/27/2017) 30 tablet 2  . traMADol (ULTRAM) 50 MG tablet Take 1 tablet (50 mg total) by mouth every 6 (six) hours as needed. (Patient not taking: Reported on 03/13/2017) 12 tablet 1   No current facility-administered medications for this encounter.    BP 129/86   Pulse 95   Wt 190 lb 12.8 oz (86.5 kg)   SpO2 100%   BMI 32.75 kg/m  General: NAD Neck: No JVD, no thyromegaly or thyroid nodule.  Lungs: Clear to auscultation bilaterally  with normal respiratory effort. CV: Nondisplaced PMI.  Heart regular S1/S2, no S3/S4, no murmur.  No peripheral edema.  No carotid bruit.  Normal pedal pulses.  Abdomen: Soft, nontender, no hepatosplenomegaly, no distention.  Skin: Intact without lesions or rashes.  Neurologic: Alert and oriented x 3.  Psych: Normal affect. Extremities: No clubbing or cyanosis.  HEENT: Normal.   Assessment/Plan: 1. Breast cancer: She recently finished Adriamcyin treatment for her breast cancer.  Baseline echo in 8/18 was normal.  I reviewed today's echo, EF and strain pattern were normal.  We discussed the rationale behind echo screening with Adriamycin use.  - She will have a repeat echo in 6 months to look for any  late effects from Adriamycin.    Loralie Champagne 04/19/2017

## 2017-04-23 NOTE — Progress Notes (Signed)
[place]  [phone] [phone]  Clinic Follow-up Note   Patient Care Team: [name], MD as PCP - General (Internal Medicine) [name], MD as Consulting Physician (General Surgery) [name], MD as Consulting Physician (Hematology) [name], MD as Consulting Physician (Radiation Oncology) 04/24/2017  CHIEF COMPLAINTS:  Follow-up for Malignant neoplasm of upper-outer quadrant of left breast in female, estrogen receptor positive   Oncology History   Cancer Staging Malignant neoplasm of upper-outer quadrant of left breast in female, estrogen receptor positive ([place]) Staging form: Breast, AJCC 8th Edition - Clinical stage from 12/29/2016: Stage IIIA (cT2, cN1, cM0, G3, ER: Positive, PR: Negative, HER2: Negative) - Signed by [name], MD on 01/05/2017       Malignant neoplasm of upper-outer quadrant of left breast in female, estrogen receptor positive ([place])   12/25/2016 Mammogram    [place] and [facility] Diagnostic Breast Tomo Bilateral 12/25/16 IMPRESSION: 1. Suspicious mass 2.3 x 1.6 x 3.0 cm  in the 130 o'clock location of the left breast 8cm from the nipple, biopsy recommended. Adjacent simple cyst is 1.9 cm. 2. Suspicious left axillary lymph node 2.2cm for which biopsy is indicated. 3. Indeterminate group of calcifications in the upper central portion of the left breast for which biopsy is recommended.       12/29/2016 Initial Biopsy    Diagnosis 12/29/16 1. Breast, left, needle core biopsy, stereotactic, upper inner quadrant - FIBROCYSTIC CHANGES INCLUDING APOCRINE METAPLASIA WITH CALCIFICATIONS - NO MALIGNANCY IDENTIFIED 2. Breast, left, needle core biopsy, ultrasound, 1:30 o'clock - INVASIVE MAMMARY CARCINOMA, G3 - SEE COMMENT 3. Lymph node, needle/core biopsy, ultrasound, left axillary - METASTIC CARCINOMA INVOLVING ONE LYMPH NODE (1/1) E-cadherin is positive supporting a ductal origin.       12/29/2016 Receptors her2    ER  15%+, weak staining  PR - HER2- Ki67 10%       12/29/2016 Initial Diagnosis    Malignant neoplasm of upper-outer quadrant of left breast in female, estrogen receptor positive ([place])      01/13/2017 Echocardiogram    ECHO 01/13/17 Study Conclusions - Left ventricle: The cavity size was normal. Systolic function was   normal. The estimated ejection fraction was in the range of 60%   to 65%. Wall motion was normal; there were no regional wall   motion abnormalities. Left ventricular diastolic function   parameters were normal. - Mitral valve: There was trivial regurgitation. - Right atrium: The atrium was mildly dilated. - Tricuspid valve: There was trivial regurgitation.       01/14/2017 Surgery    Port placement by Dr. Donne Hazel       01/15/2017 Imaging    Whole Body bone Scan 01/15/17 IMPRESSION: 1.  No significant abnormality identified.      01/15/2017 Imaging    CT CAP W Contrast 01/15/17 IMPRESSION: 1. Left lateral breast mass with pathologic left axillary adenopathy including a 2.0 cm left axillary lymph node. 2. Several hypodense lesions in the liver are likely benign lesions such as cysts. However, these are technically too small to characterize by CT. Possibilities for further workup include surveillance or hepatic protocol MRI with and without contrast. 3. There is also a tiny hypodense lesion in the spleen which is technically nonspecific but statistically highly likely to be benign. 4.  Prominent stool throughout the colon favors constipation      01/16/2017 -  Chemotherapy    Neoadjuvant chemo AC every 2 weeks for 4 cycles starting on 01/16/2017 and ended  02/27/17. 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   Analysis also detected a Variant of Uncertain Significance (VUS) in the NBN gene called c.628G>T  (p.Val210Phe)        HISTORY OF PRESENTING ILLNESS: 01/07/17 Kathryn Stanley 41 y.o. female is here because of newly diagnosed Malignant neoplasm of upper-outer quadrant of left breast in female, estrogen receptor positive. She presents to the breast clinic today with her common law husband. She felt the lump herself 3 months ago. She felt pain like "stabbing" first. She then had a mammogram 12/25/16 and subsequent biopsy   In the past she was using smoking match and quit smoking "cold [place]" that was 1 month ago.   Today she reports her breast pain still comes and goes. She has not noticed any new changes. 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 Lately has regular period. She has a weak cervix and her children were born premature. She is currently working at [employer] and works on her feet all the time, working 5 days a week.   GYN HISTORY  Menarchal: 10 LMP: 12/10/16 Contraceptive: No HRT: NA GP: G2P1 - twins    CURRENT THERAPY: Neoadjuvant chemo AC every 2 weeks for 4 cycles starting on 01/16/2017 and ended 02/27/17. Then proceed with weekly taxol for 12 treatments starting 03/13/17, added Carboplatin AUC 2 on 04/03/17 to weekly taxol, plan to complete on 05/29/17.  INTERVAL HISTORY:  [name] is here for a follow-up and cycle 7 Taxol. She presents to the clinic with her husband.  She notes UTI symptoms for the past few weeks. She denies dysuria but has urgency and feeling like she has to pee but cannot. She tried cranberry pills but does not feel like it will go away. She felt dizzy yesterday but it was not due to nutrition. It would come in waves and her lips presented as pale. She feels better today. She plans to see plastic surgeon, Dr. Iran Planas, today but hopes she can make it after chemo infusion. Her and her husband are thankful for her treatment and care.     MEDICAL HISTORY:  Past Medical History:   Diagnosis Date  . Cancer [facility])    left breast cancer  . Dermatologic problem    possible psoriasis. Pt has not had been diagnosed by dermatology.  . 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 GERD (gastroesophageal reflux disease)   . Tobacco dependence    trying to quit with nicotene patches    SURGICAL HISTORY: Past Surgical History:  Procedure Laterality Date  . CESAREAN SECTION    . PORTACATH PLACEMENT N/A 01/14/2017   Procedure: INSERTION PORT-A-CATH WITH [place];  Surgeon: [name], MD;  Location: [place];  Service: General;  Laterality: N/A;    SOCIAL HISTORY: Social History   Socioeconomic History  . Marital status: Married    Spouse name: Not on file  . Number of children: Not on file  . Years of education: Not on file  . Highest education level: Not on file  Social Needs  . Financial resource strain: Not on file  . Food insecurity - worry: Not on file  . Food insecurity -  inability: Not on file  . Transportation needs - medical: Not on file  . Transportation needs - non-medical: Not on file  Occupational History  . Not on file  Tobacco Use  . Smoking status: Former Smoker    Packs/day: 1.00    Years: 17.00    Pack years: 17.00    Types: Cigarettes    Last attempt to quit: 12/15/2016    Years since quitting: 0.3  . Smokeless tobacco: Never Used  Substance and Sexual Activity  . Alcohol use: No  . Drug use: No  . Sexual activity: Yes    Birth control/protection: None  Other Topics Concern  . Not on file  Social History Narrative  . Not on file    FAMILY HISTORY: Family History  Problem Relation Age of Onset  . Stroke Maternal Grandmother   . Stroke Father   . Hypertension Father   . Other Father        Adopted    ALLERGIES:  is allergic to chlorhexidine gluconate; amoxicillin; and nicotine.  MEDICATIONS:  Current Outpatient Medications  Medication Sig Dispense Refill    . famotidine (PEPCID) 10 MG tablet Take 10 mg by mouth daily as needed for heartburn or indigestion.    Marland Kitchen ibuprofen (ADVIL,MOTRIN) 200 MG tablet Take 200-400 mg by mouth daily as needed for headache or moderate pain.    Marland Kitchen lidocaine-prilocaine (EMLA) cream Apply to affected area once (Patient not taking: Reported on 04/17/2017) 30 g 3  . ondansetron (ZOFRAN) 8 MG tablet Take 1 tablet (8 mg total) by mouth 2 (two) times daily as needed. Start on the third day after chemotherapy. (Patient not taking: Reported on 02/27/2017) 30 tablet 1  . prochlorperazine (COMPAZINE) 10 MG tablet TAKE 1 TABLET EVERY 6 HOURS AS NEEDED FOR NAUSEA OR VOMITING (Patient not taking: Reported on 02/27/2017) 30 tablet 2  . traMADol (ULTRAM) 50 MG tablet Take 1 tablet (50 mg total) by mouth every 6 (six) hours as needed. (Patient not taking: Reported on 03/13/2017) 12 tablet 1   No current facility-administered medications for this visit.    REVIEW OF SYSTEMS:   Constitutional: Denies abnormal night sweats.  (+) complete hair loss  Eyes: Denies blurriness of vision, double vision or watery eyes Ears, nose, mouth, throat, and face: Denies mucositis or sore throat Respiratory: Denies dyspnea or wheezes   Cardiovascular: Denies palpitation, chest discomfort or lower extremity swelling Gastrointestinal:  Denies nausea, heartburn.  Skin: Denies abnormal skin rashes Lymphatics: Denies new lymphadenopathy or easy bruising Urinary: (+) urgency but feels she cannot urinate Neurological:Denies numbness, tingling or new weaknesses MSK: negative Breast: (+) occasional shooting pain in left breast, palpable mass in left breast, now smaller and softer Behavioral/Psych: Mood is stable, no new changes  All other systems were reviewed with the patient and are negative.  PHYSICAL EXAMINATION:  ECOG PERFORMANCE STATUS: 1  Vitals:   04/24/17 0859  BP: 129/76  Pulse: 92  Resp: 18  Temp: 97.9 F (36.6 C)  SpO2: 100%   Filed Weights    04/24/17 0859  Weight: 193 lb 1.6 oz (87.6 kg)     GENERAL:alert, no distress and comfortable SKIN: skin color, texture, turgor are normal, no rashes or significant lesions EYES: normal, conjunctiva are pink and non-injected, sclera clear OROPHARYNX:no exudate, no erythema and lips, buccal mucosa, and tongue normal  NECK: supple, thyroid normal size, non-tender, without nodularity LYMPH:  no palpable lymphadenopathy in the cervical, axillary or inguinal LUNGS: clear to auscultation and percussion  with normal breathing effort HEART: regular rate & rhythm and no murmurs and no lower extremity edema ABDOMEN:abdomen soft, non-tender and normal bowel sounds Musculoskeletal:no cyanosis of digits and no clubbing  PSYCH: alert & oriented x 3 with fluent speech NEURO: no focal motor/sensory deficits Breast: (+) previous 1 cm,  now 0.8 cm mass in UOQ of left breast is softer than before. (+) no longer palpable lymph node in left axillary that was previously about 1 cm.  LABORATORY DATA:  I have reviewed the data as listed CBC Latest Ref Rng & Units 04/24/2017 04/17/2017 04/10/2017  WBC 3.9 - 10.3 10e3/uL 2.7(L) 2.7(L) 3.8(L)  Hemoglobin 11.6 - 15.9 g/dL 9.7(L) 9.9(L) 10.1(L)  Hematocrit 34.8 - 46.6 % 29.6(L) 29.6(L) 30.4(L)  Platelets 145 - 400 10e3/uL 150 176 214    CMP Latest Ref Rng & Units 04/17/2017 04/10/2017 04/03/2017  Glucose 70 - 140 mg/dl 94 120 98  BUN 7.0 - 26.0 mg/dL 15.6 15.3 15.3  Creatinine 0.6 - 1.1 mg/dL 0.7 0.8 0.8  Sodium 136 - 145 mEq/L 140 140 140  Potassium 3.5 - 5.1 mEq/L 3.7 3.7 4.2  Chloride 101 - 111 mmol/L - - -  CO2 22 - 29 mEq/L 24 23 22   Calcium 8.4 - 10.4 mg/dL 9.1 9.2 9.2  Total Protein 6.4 - 8.3 g/dL 6.6 6.6 6.7  Total Bilirubin 0.20 - 1.20 mg/dL 0.44 0.37 0.35  Alkaline Phos 40 - 150 U/L 70 68 82  AST 5 - 34 U/L 31 22 28   ALT 0 - 55 U/L 52 46 72(H)     PATHOLOGY  Diagnosis 12/29/16 1. Breast, left, needle core biopsy, stereotactic, upper inner  quadrant - FIBROCYSTIC CHANGES INCLUDING APOCRINE METAPLASIA WITH CALCIFICATIONS - NO MALIGNANCY IDENTIFIED 2. Breast, left, needle core biopsy, ultrasound, 1:30 o'clock - INVASIVE MAMMARY CARCINOMA - SEE COMMENT 3. Lymph node, needle/core biopsy, ultrasound, left axillary - METASTIC CARCINOMA INVOLVING ONE LYMPH NODE (1/1) Microscopic Comment 2. The biopsy material shows an infiltrative proliferation of cells with large vesicular nuclei with conspicuous nucleoli, arranged linearly and in small clusters. There are scattered cells with marked pleomorphism and cytologic atypia. Based on the biopsy, the carcinoma appears Nottingham grade 3 of 3 and measures 0.9 cm in greatest linear extent. E-cadherin and prognostic markers (ER/PR/ki-67/HER2-FISH)are pending and will be reported in an addendum. Dr. Tresa Moore reviewed the case and agrees with the above diagnosis. This case was called to [facility] on December 30, 2016. Results: IMMUNOHISTOCHEMICAL AND MORPHOMETRIC ANALYSIS PERFORMED MANUALLY Estrogen Receptor: 15%, POSITIVE, WEAK STAINING INTENSITY Progesterone Receptor: 0%, NEGATIVE Proliferation Marker Ki67: 10% COMMENT: The negative hormone receptor study(ies) in this case has An internal positive control. Results: HER2 - NEGATIVE RATIO OF HER2/CEP17 SIGNALS 1.63 AVERAGE HER2 COPY NUMBER PER CELL 3.10   PROCEDURES  ECHO 01/13/17 Study Conclusions - Left ventricle: The cavity size was normal. Systolic function was   normal. The estimated ejection fraction was in the range of 60%   to 65%. Wall motion was normal; there were no regional wall   motion abnormalities. Left ventricular diastolic function   parameters were normal. - Mitral valve: There was trivial regurgitation. - Right atrium: The atrium was mildly dilated. - Tricuspid valve: There was trivial regurgitation.  RADIOGRAPHIC STUDIES: I have personally reviewed the radiological images as listed and agreed  with the findings in the report. No results found. ASSESSMENT & PLAN:  QUETZALY EBNER is a 41 y.o. premenopausal female with no significant past medical history, presented  with a palpable left breast mass.  1. Malignant neoplasm of upper-outer quadrant of left breast in female, ductal carcinoma, cT2N1M0, Stage IIIa, ER weakly +, PR - , HER2 - , Grade 3 -We reviewed her mammogram and initial biopsy results in details with patient and her husband.  -Her tumor is weakly ER positive, PR negative, HER-2 negative, grade 3, she has positive lymph nodes, those are all high risks for recurrence. I recommend neoadjuvant or adjuvant chemotherapy to reduce her risk of recurrence after surgery. -We discussed that the benefit of neoadjuvant chemotherapy, to shrink the tumor and lymph nodes, and potentially she would be a candidate for lumpectomy and may not need lymph node dissection if she has good response. I have discussed this with her surgeon Dr. Donne Hazel today, both of [place] recommend her to consider neoadjuvant chemotherapy. She agrees.  -we reviewed her staging scan CT from 01/15/17 in person. A few hypodense lesion were seen in the liver, appear to be benign cysts. CT and bone scan otherwise negative for distant metastasis. 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 This was discussed with patient. -I recommended neoadjuvant chemotherapy with dose dense Adriamycin and Cytoxan (AC) every 2 weeks for 4 cycles, followed by weekly Taxol for 12 weeks. She agreed. -She completed AC treatment 01/16/17-02/27/17. She tolerated well other than bone pain and constipation. She has had good clinical responce to treatment as her tumor is slightly smaller and her lymph node is no longer palpable upon 03/13/17 breast exam.  -She started her weekly taxol 03/13/17, tolerating well so far -I discussed adding Carboplatin to her weekly taxol since her genetic testing is positive for BRCA1 mutation. I discussed the side  effects and the benefits to helping further shrink her tumor. I explained that BRCA mutated breast cancer is more sensitive to platinum. This would be in attempt to achieve better or even complete response from chemotherapy. -I previously discussed her BRCA1 positive results. I explained the prophylactic steps and she agreed to proceed with  bilateral oophorectomy and mastectomy. She is interested in breast reconstruction post mastectomy. -We added Carboplatin with cycle 4 on 04/03/17. She tolerated well. I discussed this drug may lower her blood counts and if needed we can give IV fluids or hold Carbo. Plan to complete weekly CT on 12/21 -Labs reviewed, WBC stable at 2.7, Hg dropped to 9.7 today but does not need a blood transfusion. I suggest she increase her salt intake. We will monitor this. Overall CBC adequate to proceed with treatment today.  -If she develops neutropenia, will hold carboplatin or add Granix  -F/u every 2 weeks   2. Genetics: BRCA1 + -Due to her young age of diagnosis I suggested she get genetic testing. 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  -She is agreeable with bilateral oophorectomy and mastectomy, she has seen her breast surgeon Dr. Donne Hazel I again and reviewed bilateral mastectomy and reconstruction.  She is scheduled to see plastic surgeon Dr. Ashley Mariner today -I explained when her children get older they need to get tested and start cancer screenings earlier.   3. Financial Support -They are not insured and are attempting to get an orange card.  -They have met our financial support/social worker  4. Mild intermittent dizziness -Possible related to anemia and mild dehydration, she is tolerating oral  intake very well. -Encouraged her to add  some salt in her diet  5. Possible UTI -With persistent symptoms we will do a urinalysis and culture today. If positive I will call in Cipro.    PLAN:  -Labs reviewed and adequate to proceed with cycle 7 carbo and Taxol (CT) today, continue weekly -UA and urine Culture today, if results are positive I will call in Cipro and contact pt to start.  -f/u I 2 weeks    All questions were answered. The patient knows to call the clinic with any problems, questions or concerns. I spent 15 minutes counseling the patient face to face. The total time spent in the appointment was 20 minutes and more than 50% was on counseling.  This document serves as a record of services personally performed by [name], MD. It was created on her behalf by Joslyn Devon, a trained medical scribe. The creation of this record is based on the scribe's personal observations and the provider's statements to them.   I have reviewed the above documentation for accuracy and completeness, and I agree with the above.     [name], MD 04/24/2017 9:21 AM

## 2017-04-24 ENCOUNTER — Ambulatory Visit: Payer: Medicaid Other

## 2017-04-24 ENCOUNTER — Ambulatory Visit (HOSPITAL_BASED_OUTPATIENT_CLINIC_OR_DEPARTMENT_OTHER): Payer: Medicaid Other | Admitting: Hematology

## 2017-04-24 ENCOUNTER — Other Ambulatory Visit (HOSPITAL_BASED_OUTPATIENT_CLINIC_OR_DEPARTMENT_OTHER): Payer: Medicaid Other

## 2017-04-24 ENCOUNTER — Other Ambulatory Visit: Payer: Self-pay | Admitting: Hematology

## 2017-04-24 ENCOUNTER — Ambulatory Visit (HOSPITAL_BASED_OUTPATIENT_CLINIC_OR_DEPARTMENT_OTHER): Payer: Medicaid Other

## 2017-04-24 ENCOUNTER — Telehealth: Payer: Self-pay | Admitting: Hematology

## 2017-04-24 ENCOUNTER — Encounter: Payer: Self-pay | Admitting: Hematology

## 2017-04-24 VITALS — BP 129/76 | HR 92 | Temp 97.9°F | Resp 18 | Ht 64.0 in | Wt 193.1 lb

## 2017-04-24 DIAGNOSIS — N390 Urinary tract infection, site not specified: Secondary | ICD-10-CM | POA: Insufficient documentation

## 2017-04-24 DIAGNOSIS — Z170 Estrogen receptor positive status [ER+]: Secondary | ICD-10-CM

## 2017-04-24 DIAGNOSIS — R3915 Urgency of urination: Secondary | ICD-10-CM

## 2017-04-24 DIAGNOSIS — C50412 Malignant neoplasm of upper-outer quadrant of left female breast: Secondary | ICD-10-CM

## 2017-04-24 DIAGNOSIS — Z95828 Presence of other vascular implants and grafts: Secondary | ICD-10-CM

## 2017-04-24 DIAGNOSIS — Z5111 Encounter for antineoplastic chemotherapy: Secondary | ICD-10-CM | POA: Diagnosis present

## 2017-04-24 DIAGNOSIS — N3000 Acute cystitis without hematuria: Secondary | ICD-10-CM

## 2017-04-24 DIAGNOSIS — R42 Dizziness and giddiness: Secondary | ICD-10-CM

## 2017-04-24 HISTORY — DX: Urinary tract infection, site not specified: N39.0

## 2017-04-24 LAB — URINALYSIS, MICROSCOPIC - CHCC
BILIRUBIN (URINE): NEGATIVE
BLOOD: NEGATIVE
Glucose: NEGATIVE mg/dL
Ketones: 5 mg/dL
Nitrite: NEGATIVE
Protein: NEGATIVE mg/dL
RBC / HPF: NEGATIVE (ref 0–2)
Specific Gravity, Urine: 1.015 (ref 1.003–1.035)
Urobilinogen, UR: 0.2 mg/dL (ref 0.2–1)
pH: 7.5 (ref 4.6–8.0)

## 2017-04-24 LAB — CBC WITH DIFFERENTIAL/PLATELET
BASO%: 1.5 % (ref 0.0–2.0)
Basophils Absolute: 0 10*3/uL (ref 0.0–0.1)
EOS ABS: 0 10*3/uL (ref 0.0–0.5)
EOS%: 0.4 % (ref 0.0–7.0)
HCT: 29.6 % — ABNORMAL LOW (ref 34.8–46.6)
HEMOGLOBIN: 9.7 g/dL — AB (ref 11.6–15.9)
LYMPH%: 32.8 % (ref 14.0–49.7)
MCH: 29.9 pg (ref 25.1–34.0)
MCHC: 32.8 g/dL (ref 31.5–36.0)
MCV: 91.4 fL (ref 79.5–101.0)
MONO#: 0.2 10*3/uL (ref 0.1–0.9)
MONO%: 8.1 % (ref 0.0–14.0)
NEUT%: 57.2 % (ref 38.4–76.8)
NEUTROS ABS: 1.6 10*3/uL (ref 1.5–6.5)
Platelets: 150 10*3/uL (ref 145–400)
RBC: 3.24 10*6/uL — ABNORMAL LOW (ref 3.70–5.45)
RDW: 16.6 % — AB (ref 11.2–14.5)
WBC: 2.7 10*3/uL — AB (ref 3.9–10.3)
lymph#: 0.9 10*3/uL (ref 0.9–3.3)

## 2017-04-24 LAB — COMPREHENSIVE METABOLIC PANEL
ALBUMIN: 3.6 g/dL (ref 3.5–5.0)
ALT: 41 U/L (ref 0–55)
AST: 22 U/L (ref 5–34)
Alkaline Phosphatase: 66 U/L (ref 40–150)
Anion Gap: 8 mEq/L (ref 3–11)
BILIRUBIN TOTAL: 0.34 mg/dL (ref 0.20–1.20)
BUN: 19.4 mg/dL (ref 7.0–26.0)
CO2: 22 mEq/L (ref 22–29)
CREATININE: 0.8 mg/dL (ref 0.6–1.1)
Calcium: 8.9 mg/dL (ref 8.4–10.4)
Chloride: 109 mEq/L (ref 98–109)
GLUCOSE: 89 mg/dL (ref 70–140)
Potassium: 4.2 mEq/L (ref 3.5–5.1)
SODIUM: 139 meq/L (ref 136–145)
TOTAL PROTEIN: 6.5 g/dL (ref 6.4–8.3)

## 2017-04-24 MED ORDER — SODIUM CHLORIDE 0.9 % IV SOLN
298.4000 mg | Freq: Once | INTRAVENOUS | Status: AC
  Administered 2017-04-24: 300 mg via INTRAVENOUS
  Filled 2017-04-24: qty 30

## 2017-04-24 MED ORDER — DEXAMETHASONE SODIUM PHOSPHATE 10 MG/ML IJ SOLN
INTRAMUSCULAR | Status: AC
  Filled 2017-04-24: qty 1

## 2017-04-24 MED ORDER — FAMOTIDINE IN NACL 20-0.9 MG/50ML-% IV SOLN
INTRAVENOUS | Status: AC
  Filled 2017-04-24: qty 50

## 2017-04-24 MED ORDER — DIPHENHYDRAMINE HCL 50 MG/ML IJ SOLN
50.0000 mg | Freq: Once | INTRAMUSCULAR | Status: AC
  Administered 2017-04-24: 50 mg via INTRAVENOUS

## 2017-04-24 MED ORDER — DEXAMETHASONE SODIUM PHOSPHATE 10 MG/ML IJ SOLN
10.0000 mg | Freq: Once | INTRAMUSCULAR | Status: AC
  Administered 2017-04-24: 10 mg via INTRAVENOUS

## 2017-04-24 MED ORDER — PALONOSETRON HCL INJECTION 0.25 MG/5ML
0.2500 mg | Freq: Once | INTRAVENOUS | Status: AC
  Administered 2017-04-24: 0.25 mg via INTRAVENOUS

## 2017-04-24 MED ORDER — SODIUM CHLORIDE 0.9% FLUSH
10.0000 mL | Freq: Once | INTRAVENOUS | Status: AC
  Administered 2017-04-24: 10 mL
  Filled 2017-04-24: qty 10

## 2017-04-24 MED ORDER — PALONOSETRON HCL INJECTION 0.25 MG/5ML
INTRAVENOUS | Status: AC
  Filled 2017-04-24: qty 5

## 2017-04-24 MED ORDER — FAMOTIDINE IN NACL 20-0.9 MG/50ML-% IV SOLN
20.0000 mg | Freq: Once | INTRAVENOUS | Status: AC
  Administered 2017-04-24: 20 mg via INTRAVENOUS

## 2017-04-24 MED ORDER — DIPHENHYDRAMINE HCL 50 MG/ML IJ SOLN
INTRAMUSCULAR | Status: AC
  Filled 2017-04-24: qty 1

## 2017-04-24 MED ORDER — SODIUM CHLORIDE 0.9% FLUSH
10.0000 mL | INTRAVENOUS | Status: DC | PRN
  Administered 2017-04-24: 10 mL
  Filled 2017-04-24: qty 10

## 2017-04-24 MED ORDER — SODIUM CHLORIDE 0.9 % IV SOLN
80.0000 mg/m2 | Freq: Once | INTRAVENOUS | Status: AC
  Administered 2017-04-24: 156 mg via INTRAVENOUS
  Filled 2017-04-24: qty 26

## 2017-04-24 MED ORDER — HEPARIN SOD (PORK) LOCK FLUSH 100 UNIT/ML IV SOLN
500.0000 [IU] | Freq: Once | INTRAVENOUS | Status: AC | PRN
  Administered 2017-04-24: 500 [IU]
  Filled 2017-04-24: qty 5

## 2017-04-24 MED ORDER — CIPROFLOXACIN HCL 500 MG PO TABS: 500.0000 mg | ORAL_TABLET | Freq: Two times a day (BID) | ORAL | 0 refills | Status: DC

## 2017-04-24 MED ORDER — SODIUM CHLORIDE 0.9 % IV SOLN
Freq: Once | INTRAVENOUS | Status: AC
  Administered 2017-04-24: 10:00:00 via INTRAVENOUS

## 2017-04-24 MED FILL — CIPROFLOXACIN HCL 500 MG TA: 500 | 5 days supply | Qty: 10 | Fill #0

## 2017-04-24 NOTE — Patient Instructions (Signed)
[place] Discharge Instructions for Patients Receiving Chemotherapy  Today you received the following chemotherapy agents: Paclitaxel and Carboplatin.  To help prevent nausea and vomiting after your treatment, we encourage you to take your nausea medication: Compazine. Take one every 6 hours as needed. If you develop nausea and vomiting that is not controlled by your nausea medication, call the clinic.   BELOW ARE SYMPTOMS THAT SHOULD BE REPORTED IMMEDIATELY:  *FEVER GREATER THAN 100.5 F  *CHILLS WITH OR WITHOUT FEVER  NAUSEA AND VOMITING THAT IS NOT CONTROLLED WITH YOUR NAUSEA MEDICATION  *UNUSUAL SHORTNESS OF BREATH  *UNUSUAL BRUISING OR BLEEDING  TENDERNESS IN MOUTH AND THROAT WITH OR WITHOUT PRESENCE OF ULCERS  *URINARY PROBLEMS  *BOWEL PROBLEMS  UNUSUAL RASH Items with * indicate a potential emergency and should be followed up as soon as possible.  Feel free to call the clinic should you have any questions or concerns. The clinic phone number is [phone].  Please show the [facility] at check-in to the Emergency Department and triage nurse.

## 2017-04-24 NOTE — Telephone Encounter (Signed)
I called patient and spoke with her husband about her UA result and I have called in cipro for her, she will start tomorrow.   [name]  04/24/2017

## 2017-04-25 LAB — URINE CULTURE: Organism ID, Bacteria: NO GROWTH

## 2017-04-28 ENCOUNTER — Telehealth: Payer: Self-pay | Admitting: *Deleted

## 2017-04-28 NOTE — Telephone Encounter (Signed)
TCT patient  To inform pt of u/a results. Spoke with pts husband. He states she is at work at this time.  Advised him of u/a results, and that an antibiotic has been called in to her pharmacy. He states she has picked up the prescription and started taking it over the weekend.  He states she is feeling better. He did also state that pt has had a little bit of blood from her nose the last couple of mornings.  Advised him that with the change in temperature and drier air may be a contributing factor. Advised him on the use of a humidifier at night and/or saline nasal spray to help keep mucous membranes moister.  He voiced understanding. No further questions or concerns.

## 2017-05-01 ENCOUNTER — Ambulatory Visit: Payer: Medicaid Other

## 2017-05-01 ENCOUNTER — Ambulatory Visit (HOSPITAL_BASED_OUTPATIENT_CLINIC_OR_DEPARTMENT_OTHER): Payer: Medicaid Other

## 2017-05-01 ENCOUNTER — Other Ambulatory Visit: Payer: Self-pay | Admitting: Oncology

## 2017-05-01 ENCOUNTER — Other Ambulatory Visit (HOSPITAL_BASED_OUTPATIENT_CLINIC_OR_DEPARTMENT_OTHER): Payer: Medicaid Other

## 2017-05-01 VITALS — BP 129/83 | HR 98 | Temp 98.6°F | Resp 18

## 2017-05-01 DIAGNOSIS — C50412 Malignant neoplasm of upper-outer quadrant of left female breast: Secondary | ICD-10-CM

## 2017-05-01 DIAGNOSIS — Z170 Estrogen receptor positive status [ER+]: Principal | ICD-10-CM

## 2017-05-01 DIAGNOSIS — Z5111 Encounter for antineoplastic chemotherapy: Secondary | ICD-10-CM

## 2017-05-01 DIAGNOSIS — Z95828 Presence of other vascular implants and grafts: Secondary | ICD-10-CM

## 2017-05-01 LAB — CBC WITH DIFFERENTIAL/PLATELET
BASO%: 0.4 % (ref 0.0–2.0)
Basophils Absolute: 0 10*3/uL (ref 0.0–0.1)
EOS%: 0.4 % (ref 0.0–7.0)
Eosinophils Absolute: 0 10*3/uL (ref 0.0–0.5)
HCT: 29 % — ABNORMAL LOW (ref 34.8–46.6)
HGB: 9.7 g/dL — ABNORMAL LOW (ref 11.6–15.9)
LYMPH%: 32.5 % (ref 14.0–49.7)
MCH: 30.1 pg (ref 25.1–34.0)
MCHC: 33.4 g/dL (ref 31.5–36.0)
MCV: 90.1 fL (ref 79.5–101.0)
MONO#: 0.2 10*3/uL (ref 0.1–0.9)
MONO%: 9.5 % (ref 0.0–14.0)
NEUT%: 57.2 % (ref 38.4–76.8)
NEUTROS ABS: 1.4 10*3/uL — AB (ref 1.5–6.5)
NRBC: 0 % (ref 0–0)
Platelets: 92 10*3/uL — ABNORMAL LOW (ref 145–400)
RBC: 3.22 10*6/uL — AB (ref 3.70–5.45)
RDW: 16.7 % — AB (ref 11.2–14.5)
WBC: 2.5 10*3/uL — AB (ref 3.9–10.3)
lymph#: 0.8 10*3/uL — ABNORMAL LOW (ref 0.9–3.3)

## 2017-05-01 LAB — COMPREHENSIVE METABOLIC PANEL
ALT: 55 U/L (ref 0–55)
AST: 27 U/L (ref 5–34)
Albumin: 3.5 g/dL (ref 3.5–5.0)
Alkaline Phosphatase: 66 U/L (ref 40–150)
Anion Gap: 9 mEq/L (ref 3–11)
BILIRUBIN TOTAL: 0.31 mg/dL (ref 0.20–1.20)
BUN: 10.8 mg/dL (ref 7.0–26.0)
CHLORIDE: 108 meq/L (ref 98–109)
CO2: 22 meq/L (ref 22–29)
Calcium: 8.9 mg/dL (ref 8.4–10.4)
Creatinine: 0.7 mg/dL (ref 0.6–1.1)
GLUCOSE: 114 mg/dL (ref 70–140)
Potassium: 3.7 mEq/L (ref 3.5–5.1)
SODIUM: 139 meq/L (ref 136–145)
TOTAL PROTEIN: 6.4 g/dL (ref 6.4–8.3)

## 2017-05-01 MED ORDER — SODIUM CHLORIDE 0.9% FLUSH
10.0000 mL | INTRAVENOUS | Status: DC | PRN
  Administered 2017-05-01: 10 mL
  Filled 2017-05-01: qty 10

## 2017-05-01 MED ORDER — DEXAMETHASONE SODIUM PHOSPHATE 10 MG/ML IJ SOLN
10.0000 mg | Freq: Once | INTRAMUSCULAR | Status: AC
  Administered 2017-05-01: 10 mg via INTRAVENOUS

## 2017-05-01 MED ORDER — DIPHENHYDRAMINE HCL 50 MG/ML IJ SOLN
INTRAMUSCULAR | Status: AC
  Filled 2017-05-01: qty 1

## 2017-05-01 MED ORDER — FAMOTIDINE IN NACL 20-0.9 MG/50ML-% IV SOLN
INTRAVENOUS | Status: AC
  Filled 2017-05-01: qty 50

## 2017-05-01 MED ORDER — SODIUM CHLORIDE 0.9% FLUSH
10.0000 mL | Freq: Once | INTRAVENOUS | Status: AC
  Administered 2017-05-01: 10 mL
  Filled 2017-05-01: qty 10

## 2017-05-01 MED ORDER — SODIUM CHLORIDE 0.9 % IV SOLN
80.0000 mg/m2 | Freq: Once | INTRAVENOUS | Status: AC
  Administered 2017-05-01: 156 mg via INTRAVENOUS
  Filled 2017-05-01: qty 26

## 2017-05-01 MED ORDER — FAMOTIDINE IN NACL 20-0.9 MG/50ML-% IV SOLN
20.0000 mg | Freq: Once | INTRAVENOUS | Status: AC
  Administered 2017-05-01: 20 mg via INTRAVENOUS

## 2017-05-01 MED ORDER — DIPHENHYDRAMINE HCL 50 MG/ML IJ SOLN
50.0000 mg | Freq: Once | INTRAMUSCULAR | Status: AC
  Administered 2017-05-01: 50 mg via INTRAVENOUS

## 2017-05-01 MED ORDER — HEPARIN SOD (PORK) LOCK FLUSH 100 UNIT/ML IV SOLN
500.0000 [IU] | Freq: Once | INTRAVENOUS | Status: AC | PRN
  Administered 2017-05-01: 500 [IU]
  Filled 2017-05-01: qty 5

## 2017-05-01 MED ORDER — SODIUM CHLORIDE 0.9 % IV SOLN
Freq: Once | INTRAVENOUS | Status: AC
  Administered 2017-05-01: 13:00:00 via INTRAVENOUS

## 2017-05-01 MED ORDER — DEXAMETHASONE SODIUM PHOSPHATE 10 MG/ML IJ SOLN
INTRAMUSCULAR | Status: AC
  Filled 2017-05-01: qty 1

## 2017-05-01 NOTE — Progress Notes (Signed)
Pt complaint of blood tinged secretions when she blows her nose x 1 week.  Pt also states that her right ankle has been swollen x 1 day. Dr Learta Codding notified (covering for Dr. Burr Medico) and Per Dr. Learta Codding okay to treat pt with Taxol today and hold Carbo related to [facility] of 1.4.

## 2017-05-01 NOTE — Patient Instructions (Addendum)
[facility] Discharge Instructions for Patients Receiving Chemotherapy  Today you received the following chemotherapy agents Taxol   To help prevent nausea and vomiting after your treatment, we encourage you to take your nausea medication as directed   If you develop nausea and vomiting that is not controlled by your nausea medication, call the clinic.   BELOW ARE SYMPTOMS THAT SHOULD BE REPORTED IMMEDIATELY:  *FEVER GREATER THAN 100.5 F  *CHILLS WITH OR WITHOUT FEVER  NAUSEA AND VOMITING THAT IS NOT CONTROLLED WITH YOUR NAUSEA MEDICATION  *UNUSUAL SHORTNESS OF BREATH  *UNUSUAL BRUISING OR BLEEDING  TENDERNESS IN MOUTH AND THROAT WITH OR WITHOUT PRESENCE OF ULCERS  *URINARY PROBLEMS  *BOWEL PROBLEMS  UNUSUAL RASH Items with * indicate a potential emergency and should be followed up as soon as possible.  Feel free to call the clinic should you have any questions or concerns. The clinic phone number is [phone].  Please show the [facility] at check-in to the Emergency Department and triage nurse.   Neutropenia Neutropenia is a condition that occurs when you have a lower-than-normal level of a type of white blood cell (neutrophil) in your body. Neutrophils are made in the spongy center of large bones (bone marrow) and they fight infections. Neutrophils are your body's main defense against bacterial and fungal infections. The fewer neutrophils you have and the longer your body remains without them, the greater your risk of getting a severe infection. What are the causes? This condition can occur if your body uses up or destroys neutrophils faster than your bone marrow can make them. This problem may happen because of:  Bacterial or fungal infection.  Allergic disorders.  Reactions to some medicines.  Autoimmune disease.  An enlarged spleen.  This condition can also occur if your bone marrow does not produce enough neutrophils. This problem may  be caused by:  Cancer.  Cancer treatments, such as radiation or chemotherapy.  Viral infections.  Medicines, such as phenytoin.  Vitamin B12 deficiency.  Diseases of the bone marrow.  Environmental toxins, such as insecticides.  What are the signs or symptoms? This condition does not usually cause symptoms. If symptoms are present, they are usually caused by an underlying infection. Symptoms of an infection may include:  Fever.  Chills.  Swollen glands.  Oral or anal ulcers.  Cough and shortness of breath.  Rash.  Skin infection.  Fatigue.  How is this diagnosed? Your health care provider may suspect neutropenia if you have:  A condition that may cause neutropenia.  Symptoms of infection, especially fever.  Frequent and unusual infections.  You will have a medical history and physical exam. Tests will also be done, such as:  A complete blood count (CBC).  A procedure to collect a sample of bone marrow for examination (bone marrow biopsy).  A chest X-ray.  A urine culture.  A blood culture.  How is this treated? Treatment depends on the underlying cause and severity of your condition. Mild neutropenia may not require treatment. Treatment may include medicines, such as:  Antibiotic medicine given through an IV tube.  Antiviral medicines.  Antifungal medicines.  A medicine to increase neutrophil production (colony-stimulating factor). You may get this drug through an IV tube or by injection.  Steroids given through an IV tube.  If an underlying condition is causing neutropenia, you may need treatment for that condition. If medicines you are taking are causing neutropenia, your health care provider may have you stop taking  those medicines. Follow these instructions at home: Medicines  Take over-the-counter and prescription medicines only as told by your health care provider.  Get a seasonal flu shot (influenza vaccine). Lifestyle  Do not eat  unpasteurized foods.Do not eat unwashed raw fruits or vegetables.  Avoid exposure to groups of people or children.  Avoid being around people who are sick.  Avoid being around dirt or dust, such as in construction areas or gardens.  Do not provide direct care for pets. Avoid animal droppings. Do not clean litter boxes and bird cages. Hygiene   Bathe daily.  Clean the area between the genitals and the anus (perineal area) after you urinate or have a bowel movement. If you are female, wipe from front to back.  Brush your teeth with a soft toothbrush before and after meals.  Do not use a razor that has a blade. Use an electric razor to remove hair.  Wash your hands often. Make sure others who come in contact with you also wash their hands. If soap and water are not available, use hand sanitizer. General instructions  Do not have sex unless your health care provider has approved.  Take actions to avoid cuts and burns. For example: ? Be cautious when you use knives. Always cut away from yourself. ? Keep knives in protective sheaths or guards when not in use. ? Use oven mitts when you cook with a hot stove, oven, or grill. ? Stand a safe distance away from open fires.  Avoid people who received a vaccine in the past 30 days if that vaccine contained a live version of the germ (live vaccine). You should not get a live vaccine. Common live vaccines are varicella, measles, mumps, and rubella.  Do not share food utensils.  Do not use tampons, enemas, or rectal suppositories unless your health care provider has approved.  Keep all appointments as told by your health care provider. This is important. Contact a health care provider if:  You have a fever.  You have chills or you start to shake.  You have: ? A sore throat. ? A warm, red, or tender area on your skin. ? A cough. ? Frequent or painful urination. ? Vaginal discharge or itching.  You develop: ? Sores in your mouth or  anus. ? Swollen lymph nodes. ? Red streaks on the skin. ? A rash.  You feel: ? Nauseous or you vomit. ? Very fatigued. ? Short of breath. This information is not intended to replace advice given to you by your health care provider. Make sure you discuss any questions you have with your health care provider. Document Released: 11/15/2001 Document Revised: 11/01/2015 Document Reviewed: 12/06/2014 Elsevier Interactive Patient Education  [employer].

## 2017-05-01 NOTE — Patient Instructions (Signed)

## 2017-05-08 ENCOUNTER — Ambulatory Visit: Payer: Medicaid Other

## 2017-05-08 ENCOUNTER — Ambulatory Visit (HOSPITAL_BASED_OUTPATIENT_CLINIC_OR_DEPARTMENT_OTHER): Payer: Medicaid Other

## 2017-05-08 ENCOUNTER — Encounter: Payer: Self-pay | Admitting: Nurse Practitioner

## 2017-05-08 ENCOUNTER — Ambulatory Visit (HOSPITAL_BASED_OUTPATIENT_CLINIC_OR_DEPARTMENT_OTHER): Payer: Medicaid Other | Admitting: Nurse Practitioner

## 2017-05-08 ENCOUNTER — Telehealth: Payer: Self-pay | Admitting: Hematology

## 2017-05-08 VITALS — BP 129/85 | HR 88 | Temp 98.3°F | Resp 18 | Ht 64.0 in | Wt 192.6 lb

## 2017-05-08 DIAGNOSIS — R040 Epistaxis: Secondary | ICD-10-CM

## 2017-05-08 DIAGNOSIS — D696 Thrombocytopenia, unspecified: Secondary | ICD-10-CM

## 2017-05-08 DIAGNOSIS — C50412 Malignant neoplasm of upper-outer quadrant of left female breast: Secondary | ICD-10-CM

## 2017-05-08 DIAGNOSIS — R609 Edema, unspecified: Secondary | ICD-10-CM | POA: Diagnosis not present

## 2017-05-08 DIAGNOSIS — D702 Other drug-induced agranulocytosis: Secondary | ICD-10-CM

## 2017-05-08 DIAGNOSIS — D6959 Other secondary thrombocytopenia: Secondary | ICD-10-CM | POA: Diagnosis not present

## 2017-05-08 DIAGNOSIS — Z170 Estrogen receptor positive status [ER+]: Secondary | ICD-10-CM | POA: Diagnosis not present

## 2017-05-08 DIAGNOSIS — Z95828 Presence of other vascular implants and grafts: Secondary | ICD-10-CM

## 2017-05-08 DIAGNOSIS — R600 Localized edema: Secondary | ICD-10-CM

## 2017-05-08 LAB — COMPREHENSIVE METABOLIC PANEL
ALT: 54 U/L (ref 0–55)
ANION GAP: 8 meq/L (ref 3–11)
AST: 30 U/L (ref 5–34)
Albumin: 3.7 g/dL (ref 3.5–5.0)
Alkaline Phosphatase: 70 U/L (ref 40–150)
BILIRUBIN TOTAL: 0.47 mg/dL (ref 0.20–1.20)
BUN: 12.9 mg/dL (ref 7.0–26.0)
CO2: 22 mEq/L (ref 22–29)
CREATININE: 0.7 mg/dL (ref 0.6–1.1)
Calcium: 8.7 mg/dL (ref 8.4–10.4)
Chloride: 109 mEq/L (ref 98–109)
EGFR: >60 mL/min/{1.73_m2} (ref >60)
Glucose: 88 mg/dl (ref 70–140)
Potassium: 3.3 mEq/L — ABNORMAL LOW (ref 3.5–5.1)
Sodium: 139 mEq/L (ref 136–145)
TOTAL PROTEIN: 6.4 g/dL (ref 6.4–8.3)

## 2017-05-08 LAB — CBC WITH DIFFERENTIAL/PLATELET
BASO%: 0.5 % (ref 0.0–2.0)
Basophils Absolute: 0 10*3/uL (ref 0.0–0.1)
EOS%: 1 % (ref 0.0–7.0)
Eosinophils Absolute: 0 10*3/uL (ref 0.0–0.5)
HEMATOCRIT: 27.2 % — AB (ref 34.8–46.6)
HEMOGLOBIN: 9.2 g/dL — AB (ref 11.6–15.9)
LYMPH#: 0.9 10*3/uL (ref 0.9–3.3)
LYMPH%: 43.3 % (ref 14.0–49.7)
MCH: 30.7 pg (ref 25.1–34.0)
MCHC: 33.8 g/dL (ref 31.5–36.0)
MCV: 90.7 fL (ref 79.5–101.0)
MONO#: 0.2 10*3/uL (ref 0.1–0.9)
MONO%: 11.5 % (ref 0.0–14.0)
NEUT%: 43.7 % (ref 38.4–76.8)
NEUTROS ABS: 0.9 10*3/uL — AB (ref 1.5–6.5)
PLATELETS: 54 10*3/uL — AB (ref 145–400)
RBC: 3 10*6/uL — ABNORMAL LOW (ref 3.70–5.45)
RDW: 17.7 % — AB (ref 11.2–14.5)
WBC: 2.1 10*3/uL — AB (ref 3.9–10.3)

## 2017-05-08 MED ORDER — SODIUM CHLORIDE 0.9% FLUSH
10.0000 mL | Freq: Once | INTRAVENOUS | Status: AC
  Administered 2017-05-08: 10 mL
  Filled 2017-05-08: qty 10

## 2017-05-08 MED ORDER — POTASSIUM CHLORIDE CRYS ER 20 MEQ PO TBCR: 20.0000 meq | EXTENDED_RELEASE_TABLET | Freq: Every day | ORAL | 0 refills | Status: DC

## 2017-05-08 MED ORDER — HEPARIN SOD (PORK) LOCK FLUSH 100 UNIT/ML IV SOLN
500.0000 [IU] | Freq: Once | INTRAVENOUS | Status: AC
  Administered 2017-05-08: 500 [IU]
  Filled 2017-05-08: qty 5

## 2017-05-08 MED ORDER — FUROSEMIDE 20 MG PO TABS: 20.0000 mg | ORAL_TABLET | Freq: Every day | ORAL | 0 refills | Status: DC

## 2017-05-08 MED FILL — FUROSEMIDE 20 MG TABLET: 20 | 10 days supply | Qty: 10 | Fill #0

## 2017-05-08 MED FILL — POTASSIUM CL ER 20 MEQ TAB: 20 | 10 days supply | Qty: 10 | Fill #0

## 2017-05-08 NOTE — Progress Notes (Addendum)
[facility]  Telephone:([phone] [phone]  Clinic Follow up Note   Patient Care Team: [name], MD as PCP - General (Internal Medicine) [name], MD as Consulting Physician (General Surgery) [name], MD as Consulting Physician (Hematology) [name], MD as Consulting Physician (Radiation Oncology) 05/08/2017   CHIEF COMPLAINTS:  Follow-up for Malignant neoplasm of upper-outer quadrant of left breast in female, estrogen receptor positive   SUMMARY OF ONCOLOGIC HISTORY: Oncology History   Cancer Staging Malignant neoplasm of upper-outer quadrant of left breast in female, estrogen receptor positive ([place]) Staging form: Breast, AJCC 8th Edition - Clinical stage from 12/29/2016: Stage IIIA (cT2, cN1, cM0, G3, ER: Positive, PR: Negative, HER2: Negative) - Signed by [name], MD on 01/05/2017       Malignant neoplasm of upper-outer quadrant of left breast in female, estrogen receptor positive ([place])   12/25/2016 Mammogram    [place] and [facility] Diagnostic Breast Tomo Bilateral 12/25/16 IMPRESSION: 1. Suspicious mass 2.3 x 1.6 x 3.0 cm  in the 130 o'clock location of the left breast 8cm from the nipple, biopsy recommended. Adjacent simple cyst is 1.9 cm. 2. Suspicious left axillary lymph node 2.2cm for which biopsy is indicated. 3. Indeterminate group of calcifications in the upper central portion of the left breast for which biopsy is recommended.       12/29/2016 Initial Biopsy    Diagnosis 12/29/16 1. Breast, left, needle core biopsy, stereotactic, upper inner quadrant - FIBROCYSTIC CHANGES INCLUDING APOCRINE METAPLASIA WITH CALCIFICATIONS - NO MALIGNANCY IDENTIFIED 2. Breast, left, needle core biopsy, ultrasound, 1:30 o'clock - INVASIVE MAMMARY CARCINOMA, G3 - SEE COMMENT 3. Lymph node, needle/core biopsy, ultrasound, left axillary - METASTIC CARCINOMA INVOLVING ONE LYMPH NODE (1/1) E-cadherin is positive supporting a ductal origin.         12/29/2016 Receptors her2    ER 15%+, weak staining  PR - HER2- Ki67 10%       12/29/2016 Initial Diagnosis    Malignant neoplasm of upper-outer quadrant of left breast in female, estrogen receptor positive ([place])      01/13/2017 Echocardiogram    ECHO 01/13/17 Study Conclusions - Left ventricle: The cavity size was normal. Systolic function was   normal. The estimated ejection fraction was in the range of 60%   to 65%. Wall motion was normal; there were no regional wall   motion abnormalities. Left ventricular diastolic function   parameters were normal. - Mitral valve: There was trivial regurgitation. - Right atrium: The atrium was mildly dilated. - Tricuspid valve: There was trivial regurgitation.       01/14/2017 Surgery    Port placement by Dr. Donne Hazel       01/15/2017 Imaging    Whole Body bone Scan 01/15/17 IMPRESSION: 1.  No significant abnormality identified.      01/15/2017 Imaging    CT CAP W Contrast 01/15/17 IMPRESSION: 1. Left lateral breast mass with pathologic left axillary adenopathy including a 2.0 cm left axillary lymph node. 2. Several hypodense lesions in the liver are likely benign lesions such as cysts. However, these are technically too small to characterize by CT. Possibilities for further workup include surveillance or hepatic protocol MRI with and without contrast. 3. There is also a tiny hypodense lesion in the spleen which is technically nonspecific but statistically highly likely to be benign. 4.  Prominent stool throughout the colon favors constipation      01/16/2017 -  Chemotherapy    Neoadjuvant chemo AC every 2 weeks for  4 cycles starting on 01/16/2017 and ended 02/27/17. 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   Analysis also detected a Variant of Uncertain Significance (VUS)  in the NBN gene called c.628G>T (p.Val210Phe)      CURRENT THERAPY: Neoadjuvant chemo AC every 2 weeks for 4 cycles starting on 01/16/2017 and ended 02/27/17. Then proceed with weekly taxol for 12 treatments starting 03/13/17, added Carboplatin AUC 2 on 04/03/17 to weekly taxol, plan to complete on 05/29/17.  INTERVAL HISTORY: Ms. Hurtado returns for follow-up as scheduled prior to cycle 9 neoadjuvant Taxol/carbo.  He feels well, denies fatigue or decreased appetite; she continues to work.  She has "normal" constipation for 3 days after chemo, spontaneously resolves; denies nausea, vomiting, diarrhea.  Over the last 2 weeks she developed bilateral swelling in lower extremities that usually fluctuates but has become more persistent over the last few days.  Her feet swell she notices tingling in her toes intermittently, no numbness.  Denies calf tenderness, dyspnea, cough, chest pain.  Additionally she has noticed clots when blowing her nose, at first large clots then get smaller throughout the day.  Denies material, dysuria, or blood in stool.  She otherwise feels well and has no specific complaints.   REVIEW OF SYSTEMS:   Constitutional: Denies fatigue, fevers, chills or abnormal weight loss (+) good appetite Eyes: Denies blurriness of vision Ears, nose, mouth, throat, and face: Denies mucositis or sore throat (+) passing large clots daily when blowing nose Respiratory: Denies cough, dyspnea or wheezes Cardiovascular: Denies palpitation, chest discomfort (+) lateral lower extremity swelling; denies calf tenderness Gastrointestinal:  Denies nausea, vomiting, diarrhea, heartburn (+) constipation 3 days after chemo, resolved spontaneously GU:  Denies hematuria or dysuria skin: Denies abnormal skin rashes Lymphatics: Denies new lymphadenopathy or easy bruising.   Neurological:Denies numbness or new weaknesses (+) intermittent tingling to toes when feet swell Behavioral/Psych: Mood is stable, no new  changes  All other systems were reviewed with the patient and are negative.  MEDICAL HISTORY:  Past Medical History:  Diagnosis Date  . Cancer [facility])    left breast cancer  . Dermatologic problem    possible psoriasis. Pt has not had been diagnosed by dermatology.  . 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 GERD (gastroesophageal reflux disease)   . Tobacco dependence    trying to quit with nicotene patches    SURGICAL HISTORY: Past Surgical History:  Procedure Laterality Date  . CESAREAN SECTION    . PORTACATH PLACEMENT N/A 01/14/2017   Procedure: INSERTION PORT-A-CATH WITH [place];  Surgeon: [name], MD;  Location: [facility];  Service: General;  Laterality: N/A;    I have reviewed the social history and family history with the patient and they are unchanged from previous note.  ALLERGIES:  is allergic to chlorhexidine gluconate; amoxicillin; and nicotine.  MEDICATIONS:  Current Outpatient Medications  Medication Sig Dispense Refill  . famotidine (PEPCID) 10 MG tablet Take 10 mg by mouth daily as needed for heartburn or indigestion.    Marland Kitchen ibuprofen (ADVIL,MOTRIN) 200 MG tablet Take 200-400 mg by mouth daily as needed for headache or moderate pain.    Marland Kitchen lidocaine-prilocaine (EMLA) cream Apply to affected area once 30 g 3  . ondansetron (ZOFRAN) 8 MG tablet Take 1  tablet (8 mg total) by mouth 2 (two) times daily as needed. Start on the third day after chemotherapy. 30 tablet 1  . ciprofloxacin (CIPRO) 500 MG tablet Take 1 tablet (500 mg total) 2 (two) times daily by mouth. 10 tablet 0  . furosemide (LASIX) 20 MG tablet Take 1 tablet (20 mg total) by mouth daily. Take for 3 days 10 tablet 0  . potassium chloride SA (K-DUR,KLOR-CON) 20 MEQ tablet Take 1 tablet (20 mEq total) by mouth daily. Take once daily with lasix 10 tablet 0  . prochlorperazine (COMPAZINE) 10 MG tablet TAKE 1 TABLET EVERY 6 HOURS AS NEEDED  FOR NAUSEA OR VOMITING (Patient not taking: Reported on 05/08/2017) 30 tablet 2  . traMADol (ULTRAM) 50 MG tablet Take 1 tablet (50 mg total) by mouth every 6 (six) hours as needed. 12 tablet 1   No current facility-administered medications for this visit.     PHYSICAL EXAMINATION: ECOG PERFORMANCE STATUS: 1 - Symptomatic but completely ambulatory  Vitals:   05/08/17 1251  BP: 129/85  Pulse: 88  Resp: 18  Temp: 98.3 F (36.8 C)  SpO2: 98%   Filed Weights   05/08/17 1251  Weight: 192 lb 9.6 oz (87.4 kg)    GENERAL:alert, no distress and comfortable SKIN: skin color, texture, turgor are normal, no rashes or significant lesions EYES: normal, Conjunctiva are pink and non-injected, sclera clear OROPHARYNX:no exudate, no erythema and lips, buccal mucosa, and tongue normal  NECK: supple, thyroid normal size, non-tender, without nodularity LYMPH:  no palpable cervical, supraclavicular, or axillary lymphadenopathy  LUNGS: clear to auscultation bilaterally with normal breathing effort HEART: regular rate & rhythm and no murmurs (+) 2+ symmetric bilateral lower extremity edema; no calf tenderness or palpable cord ABDOMEN:abdomen soft, non-tender and normal bowel sounds Musculoskeletal:no cyanosis of digits and no clubbing  NEURO: alert & oriented x 3 with fluent speech, no focal motor/sensory deficits Breast exam deferred today  LABORATORY DATA:  I have reviewed the data as listed CBC Latest Ref Rng & Units 05/08/2017 05/01/2017 04/24/2017  WBC 3.9 - 10.3 10e3/uL 2.1(L) 2.5(L) 2.7(L)  Hemoglobin 11.6 - 15.9 g/dL 9.2(L) 9.7(L) 9.7(L)  Hematocrit 34.8 - 46.6 % 27.2(L) 29.0(L) 29.6(L)  Platelets 145 - 400 10e3/uL 54(L) 92(L) 150     CMP Latest Ref Rng & Units 05/08/2017 05/01/2017 04/24/2017  Glucose 70 - 140 mg/dl 88 114 89  BUN 7.0 - 26.0 mg/dL 12.9 10.8 19.4  Creatinine 0.6 - 1.1 mg/dL 0.7 0.7 0.8  Sodium 136 - 145 mEq/L 139 139 139  Potassium 3.5 - 5.1 mEq/L 3.3(L) 3.7 4.2    Chloride 101 - 111 mmol/L - - -  CO2 22 - 29 mEq/L _0 Calcium 8.4 - 10.4 mg/dL 8.7 8.9 8.9  Total Protein 6.4 - 8.3 g/dL 6.4 6.4 6.5  Total Bilirubin 0.20 - 1.20 mg/dL 0.47 0.31 0.34  Alkaline Phos 40 - 150 U/L 70 66 66  AST 5 - 34 U/L _1 ALT 0 - 55 U/L 54 55 41    RADIOGRAPHIC STUDIES: I have personally reviewed the radiological images as listed and agreed with the findings in the report. No results found.   ASSESSMENT & PLAN: TAWNEY VANORMAN is a 41 y.o. premenopausal female with no significant past medical history, presented with a palpable left breast mass.  1. Malignant neoplasm of upper-outer quadrant of left breast in female, ductal carcinoma, cT2N1M0, Stage IIIa, ER weakly +, PR - , HER2 - ,  Grade 3 2. Genetics BRCA1+ 3. Financial support  4. Mild intermittent dizziness 5. Possible UTI 6. Bleeding with nose blowing 7. Fluid retention   Ms. Olgin appears stable today.  She developed passing large clots when blowing nose, secondary to thrombocytopenia from chemotherapy.  Carboplatin was held from cycle 8 on 05/01/2017.  Platelets 54k today, she does not require platelet transfusion.  Discussed bleeding precautions, she will avoid NSAIDs and blowing her nose.  ANC 0.9.  Will hold chemo today.  We discussed hand hygiene, fever, and neutropenia precautions.  She developed symmetric bilateral lower extremity edema over 2 weeks.  No cough, dyspnea, calf tenderness, or palpable cord. This is likely secondary to chemotherapy, I do not suspect blood clot. She does elevate legs and this helps; she can wear compression stockings. She will take 20 mg Lasix daily for 3 days with 20 mEq potassium daily for 3 days.  She will return Monday 12/3 for lab visit only, will wait in the lobby for review of results to determine if further supportive therapy is necessary.  She will then return 05/15/2017 for Taxol and dose-reduced carboplatin counts are adequate.  PLAN -Reviewed bleeding,  fever, infection precautions -Avoid blowing nose and NSAIDs for bleeding -Lasix and potassium daily for 3 days for fluid retention; elevate legs, compression stockings -Labs reviewed, thrombocytopenic and neutropenic, hold chemotherapy today -Return 12/3 for labs, wait in lobby for review of results -Return 12/7 for next cycle taxol and dose-reduced carbo if counts adequate -I completed paperwork for patient regarding rent assistance; signed and returned to patient during visit  All questions were answered. The patient knows to call the clinic with any problems, questions or concerns. No barriers to learning was detected.    Alla Feeling, NP 05/08/17

## 2017-05-08 NOTE — Telephone Encounter (Signed)
Gave avs and calendar for december

## 2017-05-11 ENCOUNTER — Other Ambulatory Visit (HOSPITAL_BASED_OUTPATIENT_CLINIC_OR_DEPARTMENT_OTHER): Payer: Medicaid Other

## 2017-05-11 DIAGNOSIS — C50412 Malignant neoplasm of upper-outer quadrant of left female breast: Secondary | ICD-10-CM | POA: Diagnosis present

## 2017-05-11 DIAGNOSIS — Z170 Estrogen receptor positive status [ER+]: Principal | ICD-10-CM

## 2017-05-11 LAB — COMPREHENSIVE METABOLIC PANEL
ALT: 66 U/L — ABNORMAL HIGH (ref 0–55)
AST: 36 U/L — AB (ref 5–34)
Albumin: 3.8 g/dL (ref 3.5–5.0)
Alkaline Phosphatase: 68 U/L (ref 40–150)
Anion Gap: 9 mEq/L (ref 3–11)
BUN: 18.3 mg/dL (ref 7.0–26.0)
CO2: 24 meq/L (ref 22–29)
Calcium: 8.8 mg/dL (ref 8.4–10.4)
Chloride: 108 mEq/L (ref 98–109)
Creatinine: 0.9 mg/dL (ref 0.6–1.1)
GLUCOSE: 100 mg/dL (ref 70–140)
POTASSIUM: 3.5 meq/L (ref 3.5–5.1)
SODIUM: 140 meq/L (ref 136–145)
Total Bilirubin: 0.43 mg/dL (ref 0.20–1.20)
Total Protein: 6.5 g/dL (ref 6.4–8.3)

## 2017-05-11 LAB — CBC WITH DIFFERENTIAL/PLATELET
BASO%: 0.8 % (ref 0.0–2.0)
BASOS ABS: 0 10*3/uL (ref 0.0–0.1)
EOS%: 0.5 % (ref 0.0–7.0)
Eosinophils Absolute: 0 10*3/uL (ref 0.0–0.5)
HCT: 28.5 % — ABNORMAL LOW (ref 34.8–46.6)
HGB: 9.5 g/dL — ABNORMAL LOW (ref 11.6–15.9)
LYMPH#: 0.9 10*3/uL (ref 0.9–3.3)
LYMPH%: 40.2 % (ref 14.0–49.7)
MCH: 30.9 pg (ref 25.1–34.0)
MCHC: 33.3 g/dL (ref 31.5–36.0)
MCV: 92.5 fL (ref 79.5–101.0)
MONO#: 0.4 10*3/uL (ref 0.1–0.9)
MONO%: 16.6 % — AB (ref 0.0–14.0)
NEUT#: 0.9 10*3/uL — ABNORMAL LOW (ref 1.5–6.5)
NEUT%: 41.9 % (ref 38.4–76.8)
Platelets: 81 10*3/uL — ABNORMAL LOW (ref 145–400)
RBC: 3.08 10*6/uL — AB (ref 3.70–5.45)
RDW: 20.8 % — ABNORMAL HIGH (ref 11.2–14.5)
WBC: 2.2 10*3/uL — AB (ref 3.9–10.3)

## 2017-05-12 ENCOUNTER — Telehealth: Payer: Self-pay | Admitting: Hematology

## 2017-05-12 ENCOUNTER — Other Ambulatory Visit: Payer: Self-pay | Admitting: Hematology

## 2017-05-12 ENCOUNTER — Telehealth: Payer: Self-pay | Admitting: *Deleted

## 2017-05-12 ENCOUNTER — Ambulatory Visit (HOSPITAL_BASED_OUTPATIENT_CLINIC_OR_DEPARTMENT_OTHER): Payer: Medicaid Other

## 2017-05-12 VITALS — BP 137/84 | HR 94 | Temp 98.2°F | Resp 18

## 2017-05-12 DIAGNOSIS — Z95828 Presence of other vascular implants and grafts: Secondary | ICD-10-CM

## 2017-05-12 DIAGNOSIS — C50412 Malignant neoplasm of upper-outer quadrant of left female breast: Secondary | ICD-10-CM | POA: Diagnosis not present

## 2017-05-12 DIAGNOSIS — Z170 Estrogen receptor positive status [ER+]: Principal | ICD-10-CM

## 2017-05-12 DIAGNOSIS — Z5189 Encounter for other specified aftercare: Secondary | ICD-10-CM

## 2017-05-12 MED ORDER — TBO-FILGRASTIM 480 MCG/0.8ML ~~LOC~~ SOSY
480.0000 ug | PREFILLED_SYRINGE | Freq: Once | SUBCUTANEOUS | Status: AC
  Administered 2017-05-12: 480 ug via SUBCUTANEOUS
  Filled 2017-05-12: qty 0.8

## 2017-05-12 MED ORDER — FILGRASTIM 480 MCG/0.8ML IJ SOSY: 480.0000 ug | PREFILLED_SYRINGE | Freq: Once | INTRAMUSCULAR | Status: DC

## 2017-05-12 NOTE — Telephone Encounter (Signed)
Scheduled sat per 12/4 sch msg when patient came in

## 2017-05-12 NOTE — Telephone Encounter (Signed)
Called & spoke to pt's husband & req pt to come in today & Sat. for granix injection to build [facility]  She is at work & can be here @ 3pm.  Message to [employer]

## 2017-05-12 NOTE — Telephone Encounter (Signed)
-----   Message from [name], MD sent at 05/12/2017  9:53 AM EST ----- Please call pt and schedule granix injection today, and this Saturday, thanks   [name]  05/12/2017

## 2017-05-12 NOTE — Patient Instructions (Signed)
Tbo-Filgrastim injection What is this medicine? TBO-FILGRASTIM (T B O fil GRA stim) is a granulocyte colony-stimulating factor that stimulates the growth of neutrophils, a type of white blood cell important in the body's fight against infection. It is used to reduce the incidence of fever and infection in patients with certain types of cancer who are receiving chemotherapy that affects the bone marrow. This medicine may be used for other purposes; ask your health care provider or pharmacist if you have questions. COMMON BRAND NAME(S): Granix What should I tell my health care provider before I take this medicine? They need to know if you have any of these conditions: -bone scan or tests planned -kidney disease -sickle cell anemia -an unusual or allergic reaction to tbo-filgrastim, filgrastim, pegfilgrastim, other medicines, foods, dyes, or preservatives -pregnant or trying to get pregnant -breast-feeding How should I use this medicine? This medicine is for injection under the skin. If you get this medicine at home, you will be taught how to prepare and give this medicine. Refer to the Instructions for Use that come with your medication packaging. Use exactly as directed. Take your medicine at regular intervals. Do not take your medicine more often than directed. It is important that you put your used needles and syringes in a special sharps container. Do not put them in a trash can. If you do not have a sharps container, call your pharmacist or healthcare provider to get one. Talk to your pediatrician regarding the use of this medicine in children. Special care may be needed. Overdosage: If you think you have taken too much of this medicine contact a poison control center or emergency room at once. NOTE: This medicine is only for you. Do not share this medicine with others. What if I miss a dose? It is important not to miss your dose. Call your doctor or health care professional if you miss a  dose. What may interact with this medicine? This medicine may interact with the following medications: -medicines that may cause a release of neutrophils, such as lithium This list may not describe all possible interactions. Give your health care provider a list of all the medicines, herbs, non-prescription drugs, or dietary supplements you use. Also tell them if you smoke, drink alcohol, or use illegal drugs. Some items may interact with your medicine. What should I watch for while using this medicine? You may need blood work done while you are taking this medicine. What side effects may I notice from receiving this medicine? Side effects that you should report to your doctor or health care professional as soon as possible: -allergic reactions like skin rash, itching or hives, swelling of the face, lips, or tongue -blood in the urine -dark urine -dizziness -fast heartbeat -feeling faint -shortness of breath or breathing problems -signs and symptoms of infection like fever or chills; cough; or sore throat -signs and symptoms of kidney injury like trouble passing urine or change in the amount of urine -stomach or side pain, or pain at the shoulder -sweating -swelling of the legs, ankles, or abdomen -tiredness Side effects that usually do not require medical attention (report to your doctor or health care professional if they continue or are bothersome): -bone pain -headache -muscle pain -vomiting This list may not describe all possible side effects. Call your doctor for medical advice about side effects. You may report side effects to FDA at 1-800-FDA-1088. Where should I keep my medicine? Keep out of the reach of children. Store in a refrigerator between   2 and 8 degrees C (36 and 46 degrees F). Keep in carton to protect from light. Throw away this medicine if it is left out of the refrigerator for more than 5 consecutive days. Throw away any unused medicine after the expiration  date. NOTE: This sheet is a summary. It may not cover all possible information. If you have questions about this medicine, talk to your doctor, pharmacist, or health care provider.  2018 Elsevier/Gold Standard (2015-07-16 19:07:04)  

## 2017-05-12 NOTE — Telephone Encounter (Signed)
Scheduled appt per 12/4 sch msg - left message for patient regarding appt.

## 2017-05-15 ENCOUNTER — Other Ambulatory Visit (HOSPITAL_BASED_OUTPATIENT_CLINIC_OR_DEPARTMENT_OTHER): Payer: Medicaid Other

## 2017-05-15 ENCOUNTER — Ambulatory Visit (HOSPITAL_BASED_OUTPATIENT_CLINIC_OR_DEPARTMENT_OTHER): Payer: Medicaid Other

## 2017-05-15 VITALS — BP 116/81 | HR 98 | Temp 97.8°F | Resp 18

## 2017-05-15 DIAGNOSIS — Z5111 Encounter for antineoplastic chemotherapy: Secondary | ICD-10-CM

## 2017-05-15 DIAGNOSIS — C50412 Malignant neoplasm of upper-outer quadrant of left female breast: Secondary | ICD-10-CM

## 2017-05-15 DIAGNOSIS — Z170 Estrogen receptor positive status [ER+]: Principal | ICD-10-CM

## 2017-05-15 LAB — COMPREHENSIVE METABOLIC PANEL
ALT: 74 U/L — AB (ref 0–55)
ANION GAP: 11 meq/L (ref 3–11)
AST: 33 U/L (ref 5–34)
Albumin: 3.8 g/dL (ref 3.5–5.0)
Alkaline Phosphatase: 82 U/L (ref 40–150)
BILIRUBIN TOTAL: 0.35 mg/dL (ref 0.20–1.20)
BUN: 16 mg/dL (ref 7.0–26.0)
CALCIUM: 9 mg/dL (ref 8.4–10.4)
CHLORIDE: 108 meq/L (ref 98–109)
CO2: 23 mEq/L (ref 22–29)
CREATININE: 0.8 mg/dL (ref 0.6–1.1)
Glucose: 118 mg/dl (ref 70–140)
Potassium: 3.6 mEq/L (ref 3.5–5.1)
Sodium: 141 mEq/L (ref 136–145)
Total Protein: 6.8 g/dL (ref 6.4–8.3)

## 2017-05-15 LAB — CBC WITH DIFFERENTIAL/PLATELET
BASO%: 0.3 % (ref 0.0–2.0)
Basophils Absolute: 0 10*3/uL (ref 0.0–0.1)
EOS%: 0.9 % (ref 0.0–7.0)
Eosinophils Absolute: 0 10*3/uL (ref 0.0–0.5)
HEMATOCRIT: 31.8 % — AB (ref 34.8–46.6)
HGB: 10.6 g/dL — ABNORMAL LOW (ref 11.6–15.9)
LYMPH#: 1.3 10*3/uL (ref 0.9–3.3)
LYMPH%: 37.9 % (ref 14.0–49.7)
MCH: 30.9 pg (ref 25.1–34.0)
MCHC: 33.3 g/dL (ref 31.5–36.0)
MCV: 92.7 fL (ref 79.5–101.0)
MONO#: 0.4 10*3/uL (ref 0.1–0.9)
MONO%: 10.2 % (ref 0.0–14.0)
NEUT#: 1.7 10*3/uL (ref 1.5–6.5)
NEUT%: 50.7 % (ref 38.4–76.8)
Platelets: 100 10*3/uL — ABNORMAL LOW (ref 145–400)
RBC: 3.43 10*6/uL — ABNORMAL LOW (ref 3.70–5.45)
RDW: 19 % — AB (ref 11.2–14.5)
WBC: 3.4 10*3/uL — ABNORMAL LOW (ref 3.9–10.3)

## 2017-05-15 MED ORDER — SODIUM CHLORIDE 0.9 % IV SOLN
Freq: Once | INTRAVENOUS | Status: AC
  Administered 2017-05-15: 14:00:00 via INTRAVENOUS

## 2017-05-15 MED ORDER — PALONOSETRON HCL INJECTION 0.25 MG/5ML
0.2500 mg | Freq: Once | INTRAVENOUS | Status: AC
  Administered 2017-05-15: 0.25 mg via INTRAVENOUS

## 2017-05-15 MED ORDER — FAMOTIDINE IN NACL 20-0.9 MG/50ML-% IV SOLN
20.0000 mg | Freq: Once | INTRAVENOUS | Status: AC
  Administered 2017-05-15: 20 mg via INTRAVENOUS

## 2017-05-15 MED ORDER — HEPARIN SOD (PORK) LOCK FLUSH 100 UNIT/ML IV SOLN
500.0000 [IU] | Freq: Once | INTRAVENOUS | Status: AC | PRN
  Administered 2017-05-15: 500 [IU]
  Filled 2017-05-15: qty 5

## 2017-05-15 MED ORDER — DEXAMETHASONE SODIUM PHOSPHATE 10 MG/ML IJ SOLN
INTRAMUSCULAR | Status: AC
  Filled 2017-05-15: qty 1

## 2017-05-15 MED ORDER — DIPHENHYDRAMINE HCL 50 MG/ML IJ SOLN
50.0000 mg | Freq: Once | INTRAMUSCULAR | Status: AC
  Administered 2017-05-15: 50 mg via INTRAVENOUS

## 2017-05-15 MED ORDER — DIPHENHYDRAMINE HCL 50 MG/ML IJ SOLN
INTRAMUSCULAR | Status: AC
  Filled 2017-05-15: qty 1

## 2017-05-15 MED ORDER — PALONOSETRON HCL INJECTION 0.25 MG/5ML
INTRAVENOUS | Status: AC
  Filled 2017-05-15: qty 5

## 2017-05-15 MED ORDER — DEXAMETHASONE SODIUM PHOSPHATE 10 MG/ML IJ SOLN
10.0000 mg | Freq: Once | INTRAMUSCULAR | Status: AC
  Administered 2017-05-15: 10 mg via INTRAVENOUS

## 2017-05-15 MED ORDER — SODIUM CHLORIDE 0.9% FLUSH
10.0000 mL | INTRAVENOUS | Status: DC | PRN
Start: 2017-05-15 — End: 2017-05-15
  Administered 2017-05-15: 10 mL
  Filled 2017-05-15: qty 10

## 2017-05-15 MED ORDER — FAMOTIDINE IN NACL 20-0.9 MG/50ML-% IV SOLN
INTRAVENOUS | Status: AC
  Filled 2017-05-15: qty 50

## 2017-05-15 MED ORDER — SODIUM CHLORIDE 0.9 % IV SOLN
80.0000 mg/m2 | Freq: Once | INTRAVENOUS | Status: AC
  Administered 2017-05-15: 156 mg via INTRAVENOUS
  Filled 2017-05-15: qty 26

## 2017-05-15 NOTE — Patient Instructions (Signed)
[facility] Discharge Instructions for Patients Receiving Chemotherapy  Today you received the following chemotherapy agents:  Taxol.  To help prevent nausea and vomiting after your treatment, we encourage you to take your nausea medication as directed.   If you develop nausea and vomiting that is not controlled by your nausea medication, call the clinic.   BELOW ARE SYMPTOMS THAT SHOULD BE REPORTED IMMEDIATELY:  *FEVER GREATER THAN 100.5 F  *CHILLS WITH OR WITHOUT FEVER  NAUSEA AND VOMITING THAT IS NOT CONTROLLED WITH YOUR NAUSEA MEDICATION  *UNUSUAL SHORTNESS OF BREATH  *UNUSUAL BRUISING OR BLEEDING  TENDERNESS IN MOUTH AND THROAT WITH OR WITHOUT PRESENCE OF ULCERS  *URINARY PROBLEMS  *BOWEL PROBLEMS  UNUSUAL RASH Items with * indicate a potential emergency and should be followed up as soon as possible.  Feel free to call the clinic should you have any questions or concerns. The clinic phone number is [phone].  Please show the CHEMO ALERT CARD at check-in to the Emergency Department and triage nurse.   

## 2017-05-15 NOTE — Progress Notes (Signed)
Per Dr. Burr Medico, okay to proceed with taxol treatment today but to hold Carboplatin treatment with patients plt of 100.

## 2017-05-16 ENCOUNTER — Ambulatory Visit: Payer: Medicaid Other

## 2017-05-17 ENCOUNTER — Telehealth: Payer: Self-pay | Admitting: *Deleted

## 2017-05-17 NOTE — Telephone Encounter (Signed)
Attempted to call patient to let patient know that the [facility] will be closed tomorrow 05/18/2017 and that someone from the [facility] will call to reschedule appointment. Left voicemail with information.

## 2017-05-18 ENCOUNTER — Ambulatory Visit: Payer: Medicaid Other

## 2017-05-20 ENCOUNTER — Telehealth: Payer: Self-pay | Admitting: Hematology

## 2017-05-20 ENCOUNTER — Ambulatory Visit (HOSPITAL_BASED_OUTPATIENT_CLINIC_OR_DEPARTMENT_OTHER): Payer: Medicaid Other

## 2017-05-20 VITALS — BP 124/80 | HR 88 | Temp 97.8°F | Resp 18

## 2017-05-20 DIAGNOSIS — Z5189 Encounter for other specified aftercare: Secondary | ICD-10-CM | POA: Diagnosis not present

## 2017-05-20 DIAGNOSIS — C50412 Malignant neoplasm of upper-outer quadrant of left female breast: Secondary | ICD-10-CM

## 2017-05-20 DIAGNOSIS — Z170 Estrogen receptor positive status [ER+]: Principal | ICD-10-CM

## 2017-05-20 MED ORDER — TBO-FILGRASTIM 480 MCG/0.8ML ~~LOC~~ SOSY
480.0000 ug | PREFILLED_SYRINGE | Freq: Once | SUBCUTANEOUS | Status: AC
  Administered 2017-05-20: 480 ug via SUBCUTANEOUS

## 2017-05-20 MED ORDER — TBO-FILGRASTIM 480 MCG/0.8ML ~~LOC~~ SOSY
PREFILLED_SYRINGE | SUBCUTANEOUS | Status: AC
  Filled 2017-05-20: qty 0.8

## 2017-05-20 NOTE — Telephone Encounter (Signed)
Left message for patient regarding appts - per 12/7 sch msg. Patient will get inj with infusion

## 2017-05-21 NOTE — Progress Notes (Signed)
Kathryn Stanley  [phone] [phone]  Clinic Follow-up Note   Patient Care Team: [name], MD as PCP - General (Internal Medicine) [name], MD as Consulting Physician (General Surgery) [name], MD as Consulting Physician (Hematology) [name], MD as Consulting Physician (Radiation Oncology)   Date of Service:  05/22/2017  CHIEF COMPLAINTS:  Follow-up for Malignant neoplasm of upper-outer quadrant of left breast in female, estrogen receptor positive   Oncology History   Cancer Staging Malignant neoplasm of upper-outer quadrant of left breast in female, estrogen receptor positive ([place]) Staging form: Breast, AJCC 8th Edition - Clinical stage from 12/29/2016: Stage IIIA (cT2, cN1, cM0, G3, ER: Positive, PR: Negative, HER2: Negative) - Signed by [name], MD on 01/05/2017       Malignant neoplasm of upper-outer quadrant of left breast in female, estrogen receptor positive ([place])   12/25/2016 Mammogram    [place] and [facility] Diagnostic Breast Tomo Bilateral 12/25/16 IMPRESSION: 1. Suspicious mass 2.3 x 1.6 x 3.0 cm  in the 130 o'clock location of the left breast 8cm from the nipple, biopsy recommended. Adjacent simple cyst is 1.9 cm. 2. Suspicious left axillary lymph node 2.2cm for which biopsy is indicated. 3. Indeterminate group of calcifications in the upper central portion of the left breast for which biopsy is recommended.       12/29/2016 Initial Biopsy    Diagnosis 12/29/16 1. Breast, left, needle core biopsy, stereotactic, upper inner quadrant - FIBROCYSTIC CHANGES INCLUDING APOCRINE METAPLASIA WITH CALCIFICATIONS - NO MALIGNANCY IDENTIFIED 2. Breast, left, needle core biopsy, ultrasound, 1:30 o'clock - INVASIVE MAMMARY CARCINOMA, G3 - SEE COMMENT 3. Lymph node, needle/core biopsy, ultrasound, left axillary - METASTIC CARCINOMA INVOLVING ONE LYMPH NODE (1/1) E-cadherin is positive supporting a ductal origin.       12/29/2016  Receptors her2    ER 15%+, weak staining  PR - HER2- Ki67 10%       12/29/2016 Initial Diagnosis    Malignant neoplasm of upper-outer quadrant of left breast in female, estrogen receptor positive ([place])      01/13/2017 Echocardiogram    ECHO 01/13/17 Study Conclusions - Left ventricle: The cavity size was normal. Systolic function was   normal. The estimated ejection fraction was in the range of 60%   to 65%. Wall motion was normal; there were no regional wall   motion abnormalities. Left ventricular diastolic function   parameters were normal. - Mitral valve: There was trivial regurgitation. - Right atrium: The atrium was mildly dilated. - Tricuspid valve: There was trivial regurgitation.       01/14/2017 Surgery    Port placement by Dr. Donne Hazel       01/15/2017 Imaging    Whole Body bone Scan 01/15/17 IMPRESSION: 1.  No significant abnormality identified.      01/15/2017 Imaging    CT CAP W Contrast 01/15/17 IMPRESSION: 1. Left lateral breast mass with pathologic left axillary adenopathy including a 2.0 cm left axillary lymph node. 2. Several hypodense lesions in the liver are likely benign lesions such as cysts. However, these are technically too small to characterize by CT. Possibilities for further workup include surveillance or hepatic protocol MRI with and without contrast. 3. There is also a tiny hypodense lesion in the spleen which is technically nonspecific but statistically highly likely to be benign. 4.  Prominent stool throughout the colon favors constipation      01/16/2017 -  Chemotherapy    Neoadjuvant chemo AC every 2 weeks for 4  cycles starting on 01/16/2017 and ended 02/27/17. 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   Analysis also detected a Variant of Uncertain Significance (VUS) in the NBN  gene called c.628G>T (p.Val210Phe)        HISTORY OF PRESENTING ILLNESS: 01/07/17 Kathryn Stanley 41 y.o. female is here because of newly diagnosed Malignant neoplasm of upper-outer quadrant of left breast in female, estrogen receptor positive. She presents to the breast clinic today with her common law husband. She felt the lump herself 3 months ago. She felt pain like "stabbing" first. She then had a mammogram 12/25/16 and subsequent biopsy   In the past she was using smoking match and quit smoking "cold [place]" that was 1 month ago.   Today she reports her breast pain still comes and goes. She has not noticed any new changes. 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 Lately has regular period. She has a weak cervix and her children were born premature. She is currently working at [employer] and works on her feet all the time, working 5 days a week.   GYN HISTORY  Menarchal: 10 LMP: 12/10/16 Contraceptive: No HRT: NA GP: G2P1 - twins    CURRENT THERAPY:  -Neoadjuvant chemo AC every 2 weeks for 4 cycles starting on 01/16/2017 and ended 02/27/17.  -Then proceed with weekly taxol for 12 treatments starting 03/13/17, added Carboplatin AUC 2 on 04/03/17 to weekly taxol. Due to symptomatic thrombocytopenia we held Carbo since cycle 8. Cycle 9 was post-poned due to neutropenia and Granix was added starting 05/12/17. Added carbo back at reduced dose with cycle 10 on 05/22/17. Plan to complete weekly CT on 06/05/17    INTERVAL HISTORY:  Kathryn Stanley is here for a follow-up and cycle 10 chemo. She presents to the clinic with her husband.  She notes she felt dizzy with [place]. When she stopped [place] her bladder felt better, no more dysuria. She has tolerated taxol. She will feel lethargic and sore from Granix injection but since it is a short injection she can tolerate it. She heard from Dr. Donne Hazel her reconstruction was able  to be covered. She received bruise on her heal when her platelet count was low. She overall feels good and is ready for treatment today.     MEDICAL HISTORY:  Past Medical History:  Diagnosis Date  . Cancer [facility])    left breast cancer  . Dermatologic problem    possible psoriasis. Pt has not had been diagnosed by dermatology.  . 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 GERD (gastroesophageal reflux disease)   . Tobacco dependence    trying to quit with nicotene patches    SURGICAL HISTORY: Past Surgical History:  Procedure Laterality Date  . CESAREAN SECTION    . PORTACATH PLACEMENT N/A 01/14/2017   Procedure: INSERTION PORT-A-CATH WITH [place];  Surgeon: [name], MD;  Location: [facility];  Service: General;  Laterality: N/A;    SOCIAL HISTORY: Social History   Socioeconomic History  . Marital status: Married    Spouse name: Not on file  . Number of children: Not on file  . Years of education: Not on file  . Highest  education level: Not on file  Social Needs  . Financial resource strain: Not on file  . Food insecurity - worry: Not on file  . Food insecurity - inability: Not on file  . Transportation needs - medical: Not on file  . Transportation needs - non-medical: Not on file  Occupational History  . Not on file  Tobacco Use  . Smoking status: Former Smoker    Packs/day: 1.00    Years: 17.00    Pack years: 17.00    Types: Cigarettes    Last attempt to quit: 12/15/2016    Years since quitting: 0.4  . Smokeless tobacco: Never Used  Substance and Sexual Activity  . Alcohol use: No  . Drug use: No  . Sexual activity: Yes    Birth control/protection: None  Other Topics Concern  . Not on file  Social History Narrative  . Not on file    FAMILY HISTORY: Family History  Problem Relation Age of Onset  . Stroke Maternal Grandmother   . Stroke Father   . Hypertension Father   . Other  Father        Adopted    ALLERGIES:  is allergic to chlorhexidine gluconate; amoxicillin; and nicotine.  MEDICATIONS:  Current Outpatient Medications  Medication Sig Dispense Refill  . famotidine (PEPCID) 10 MG tablet Take 10 mg by mouth daily as needed for heartburn or indigestion.    Marland Kitchen ibuprofen (ADVIL,MOTRIN) 200 MG tablet Take 200-400 mg by mouth daily as needed for headache or moderate pain.    Marland Kitchen lidocaine-prilocaine (EMLA) cream Apply to affected area once 30 g 3  . ondansetron (ZOFRAN) 8 MG tablet Take 1 tablet (8 mg total) by mouth 2 (two) times daily as needed. Start on the third day after chemotherapy. 30 tablet 1  . prochlorperazine (COMPAZINE) 10 MG tablet TAKE 1 TABLET EVERY 6 HOURS AS NEEDED FOR NAUSEA OR VOMITING 30 tablet 2  . furosemide (LASIX) 20 MG tablet Take 1 tablet (20 mg total) by mouth daily. Take for 3 days (Patient not taking: Reported on 05/22/2017) 10 tablet 0  . potassium chloride SA (K-DUR,KLOR-CON) 20 MEQ tablet Take 1 tablet (20 mEq total) by mouth daily. Take once daily with lasix (Patient not taking: Reported on 05/22/2017) 10 tablet 0  . traMADol (ULTRAM) 50 MG tablet Take 1 tablet (50 mg total) by mouth every 6 (six) hours as needed. (Patient not taking: Reported on 05/22/2017) 12 tablet 1   No current facility-administered medications for this visit.    Facility-Administered Medications Ordered in Other Visits  Medication Dose Route Frequency Provider Last Rate Last Dose  . sodium chloride flush (NS) 0.9 % injection 10 mL  10 mL Intracatheter PRN [name], MD   10 mL at 05/22/17 1833   REVIEW OF SYSTEMS:   Constitutional: Denies abnormal night sweats.  (+) complete hair loss (+) weight gain  Eyes: Denies blurriness of vision, double vision or watery eyes Ears, nose, mouth, throat, and face: Denies mucositis or sore throat Respiratory: Denies dyspnea or wheezes   Cardiovascular: Denies palpitation, chest discomfort or lower extremity  swelling Gastrointestinal:  Denies nausea, heartburn.  Skin: Denies abnormal skin rashes Lymphatics: Denies new lymphadenopathy or easy bruising Neurological:Denies numbness, tingling or new weaknesses MSK: negative Breast: (+) occasional shooting pain in left breast, palpable mass in left breast, now smaller and softer Behavioral/Psych: Mood is stable, no new changes  All other systems were reviewed with the patient and are negative.  PHYSICAL EXAMINATION:  ECOG PERFORMANCE STATUS: 1  Vitals:   05/22/17 1403  BP: 126/73  Pulse: 88  Resp: 20  Temp: 98 F (36.7 C)  SpO2: 100%   Filed Weights   05/22/17 1403  Weight: 198 lb 6.4 oz (90 kg)     GENERAL:alert, no distress and comfortable SKIN: skin color, texture, turgor are normal, no rashes or significant lesions EYES: normal, conjunctiva are pink and non-injected, sclera clear OROPHARYNX:no exudate, no erythema and lips, buccal mucosa, and tongue normal  NECK: supple, thyroid normal size, non-tender, without nodularity LYMPH:  no palpable lymphadenopathy in the cervical, axillary or inguinal LUNGS: clear to auscultation and percussion with normal breathing effort HEART: regular rate & rhythm and no murmurs and no lower extremity edema ABDOMEN:abdomen soft, non-tender and normal bowel sounds Musculoskeletal:no cyanosis of digits and no clubbing  PSYCH: alert & oriented x 3 with fluent speech NEURO: no focal motor/sensory deficits Breast: (+) previous 1 cm,  now 0.8 cm mass in UOQ of left breast is softer than before. (+) no longer palpable lymph node in left axillary that was previously about 1 cm.  LABORATORY DATA:  I have reviewed the data as listed CBC Latest Ref Rng & Units 05/22/2017 05/15/2017 05/11/2017  WBC 3.9 - 10.3 10e3/uL 8.2 3.4(L) 2.2(L)  Hemoglobin 11.6 - 15.9 g/dL 10.6(L) 10.6(L) 9.5(L)  Hematocrit 34.8 - 46.6 % 31.7(L) 31.8(L) 28.5(L)  Platelets 145 - 400 10e3/uL 231 100(L) 81(L)    CMP Latest Ref Rng &  Units 05/22/2017 05/15/2017 05/11/2017  Glucose 70 - 140 mg/dl 111 118 100  BUN 7.0 - 26.0 mg/dL 19.3 16.0 18.3  Creatinine 0.6 - 1.1 mg/dL 0.8 0.8 0.9  Sodium 136 - 145 mEq/L 141 141 140  Potassium 3.5 - 5.1 mEq/L 3.6 3.6 3.5  Chloride 101 - 111 mmol/L - - -  CO2 22 - 29 mEq/L 22 23 24   Calcium 8.4 - 10.4 mg/dL 9.2 9.0 8.8  Total Protein 6.4 - 8.3 g/dL 6.9 6.8 6.5  Total Bilirubin 0.20 - 1.20 mg/dL 0.30 0.35 0.43  Alkaline Phos 40 - 150 U/L 82 82 68  AST 5 - 34 U/L 44(H) 33 36(H)  ALT 0 - 55 U/L 102(H) 74(H) 66(H)     PATHOLOGY  Diagnosis 12/29/16 1. Breast, left, needle core biopsy, stereotactic, upper inner quadrant - FIBROCYSTIC CHANGES INCLUDING APOCRINE METAPLASIA WITH CALCIFICATIONS - NO MALIGNANCY IDENTIFIED 2. Breast, left, needle core biopsy, ultrasound, 1:30 o'clock - INVASIVE MAMMARY CARCINOMA - SEE COMMENT 3. Lymph node, needle/core biopsy, ultrasound, left axillary - METASTIC CARCINOMA INVOLVING ONE LYMPH NODE (1/1) Microscopic Comment 2. The biopsy material shows an infiltrative proliferation of cells with large vesicular nuclei with conspicuous nucleoli, arranged linearly and in small clusters. There are scattered cells with marked pleomorphism and cytologic atypia. Based on the biopsy, the carcinoma appears Nottingham grade 3 of 3 and measures 0.9 cm in greatest linear extent. E-cadherin and prognostic markers (ER/PR/ki-67/HER2-FISH)are pending and will be reported in an addendum. Dr. Tresa Moore reviewed the case and agrees with the above diagnosis. This case was called to [facility] on December 30, 2016. Results: IMMUNOHISTOCHEMICAL AND MORPHOMETRIC ANALYSIS PERFORMED MANUALLY Estrogen Receptor: 15%, POSITIVE, WEAK STAINING INTENSITY Progesterone Receptor: 0%, NEGATIVE Proliferation Marker Ki67: 10% COMMENT: The negative hormone receptor study(ies) in this case has An internal positive control. Results: HER2 - NEGATIVE RATIO OF HER2/CEP17 SIGNALS  1.63 AVERAGE HER2 COPY NUMBER PER CELL 3.10   PROCEDURES  ECHO 01/13/17 Study Conclusions - Left ventricle: The  cavity size was normal. Systolic function was   normal. The estimated ejection fraction was in the range of 60%   to 65%. Wall motion was normal; there were no regional wall   motion abnormalities. Left ventricular diastolic function   parameters were normal. - Mitral valve: There was trivial regurgitation. - Right atrium: The atrium was mildly dilated. - Tricuspid valve: There was trivial regurgitation.  RADIOGRAPHIC STUDIES: I have personally reviewed the radiological images as listed and agreed with the findings in the report. No results found. ASSESSMENT & PLAN:  Kathryn Stanley is a 41 y.o. premenopausal female with no significant past medical history, presented with a palpable left breast mass.  1. Malignant neoplasm of upper-outer quadrant of left breast in female, ductal carcinoma, cT2N1M0, Stage IIIa, ER weakly +, PR - , HER2 - , Grade 3 -We reviewed her mammogram and initial biopsy results in details with patient and her husband.  -Her tumor is weakly ER positive, PR negative, HER-2 negative, grade 3, she has positive lymph nodes, those are all high risks for recurrence. I recommend neoadjuvant or adjuvant chemotherapy to reduce her risk of recurrence after surgery. -We discussed that the benefit of neoadjuvant chemotherapy, to shrink the tumor and lymph nodes, and potentially she would be a candidate for lumpectomy and may not need lymph node dissection if she has good response. I have discussed this with her surgeon Dr. Donne Hazel today, both of [place] recommend her to consider neoadjuvant chemotherapy. She agrees.  -we reviewed her staging scan CT from 01/15/17 in person. A few hypodense lesion were seen in the liver, appear to be benign cysts. CT and bone scan otherwise negative for distant metastasis. 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 This was  discussed with patient. -I recommended neoadjuvant chemotherapy with dose dense Adriamycin and Cytoxan (AC) every 2 weeks for 4 cycles, followed by weekly Taxol for 12 weeks. She agreed. -She completed AC treatment 01/16/17-02/27/17. She tolerated well other than bone pain and constipation. She has had good clinical response to treatment as her tumor is slightly smaller and her lymph node is no longer palpable upon 03/13/17 breast exam.  -She started her weekly taxol 03/13/17, tolerating well so far -We added Carboplatin with cycle 4 on 04/03/17. She tolerated well. Due to symptomatic thrombocytopenia we held Carbo since cycle 8. Cycle 9 was post poned due to neutropenia and Granix was added starting 05/12/17.  -Labs reviewed and her blood counts recovered with Granix added. Will add carbo at reduced dose AUC 1.5 to her taxol today with cycle 10. Plan to complete weekly CT on 06/05/17. Will monitor her elevated AST and ALT.  -She has seen Dr. Donne Hazel and Dr. [place] Planas lately and will proceed to bilateral mastectomy and reconstruction with tissue expanders -I encouraged her to stay active with biking, jogging and walking as she has been gaining weight lately   -F/u in 2 weeks and schedule breast MRI   2. Genetics: BRCA1 + -Due to her young age of diagnosis I suggested she get genetic testing. 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  -She is agreeable with bilateral oophorectomy and mastectomy, she has seen her breast surgeon Dr. Donne Hazel I again and reviewed bilateral mastectomy and reconstruction.  She is scheduled to see plastic surgeon Dr. Ashley Mariner today -I explained  when her children get older they need to get tested and start cancer screenings earlier.  -When she is ready for  Oophorectomy I will refer her to a GYN.   3. Financial Support -They are not insured and are attempting to get an orange card.  -They have met our financial support/social worker  4. Mild intermittent dizziness -Possible related to anemia and mild dehydration, she is tolerating oral intake very well. -Encouraged her to add some salt in her diet  5. Epistaxis  -She developed passing large clots when blowing nose, secondary to thrombocytopenia from chemotherapy. Carboplatin has been held since cycle 8 on 05/01/2017.  Platelets 54k 05/08/17, she does not require platelet transfusion. -Discussed bleeding precautions, she will avoid NSAIDs and blowing her nose  6. Lower Extremity Swelling  -This is likely secondary to chemotherapy, I do not suspect blood clot. She does elevate legs and this helps; she can wear compression stockings. She did try lasix with some improvement   7. Transmantitis -05/22/17 show increase in AST at 44 and ALT at 102, likely secondary to chemotherapy -Will monitor.    PLAN:  -Lab reviewed with elevated liver enzymes, still adequate to treat. Given her recovered blood counts, will reduce Carbo to AUC 1.5 and continue Taxol today -Lab, flush and carbo taxol on 05/29/17 and 12/28 (last treatment) -F/u in 2 weeks -Breast MRI in 2-3 weeks    All questions were answered. The patient knows to call the clinic with any problems, questions or concerns. I spent 30 minutes counseling the patient face to face. The total time spent in the appointment was 40 minutes and more than 50% was on counseling.  This document serves as a record of services personally performed by [name], MD. It was created on her behalf by Joslyn Devon, a trained medical scribe. The creation of this record is based on the scribe's personal observations and the provider's statements to them.    I have reviewed the above documentation for accuracy and completeness, and I agree with the above.       [name], MD 05/22/2017 6:34 PM

## 2017-05-22 ENCOUNTER — Ambulatory Visit (HOSPITAL_BASED_OUTPATIENT_CLINIC_OR_DEPARTMENT_OTHER): Payer: Medicaid Other

## 2017-05-22 ENCOUNTER — Other Ambulatory Visit (HOSPITAL_BASED_OUTPATIENT_CLINIC_OR_DEPARTMENT_OTHER): Payer: Medicaid Other

## 2017-05-22 ENCOUNTER — Ambulatory Visit: Payer: Medicaid Other

## 2017-05-22 ENCOUNTER — Ambulatory Visit (HOSPITAL_BASED_OUTPATIENT_CLINIC_OR_DEPARTMENT_OTHER): Payer: Medicaid Other | Admitting: Hematology

## 2017-05-22 ENCOUNTER — Encounter: Payer: Self-pay | Admitting: Hematology

## 2017-05-22 VITALS — BP 126/73 | HR 88 | Temp 98.0°F | Resp 20 | Ht 64.0 in | Wt 198.4 lb

## 2017-05-22 DIAGNOSIS — C50412 Malignant neoplasm of upper-outer quadrant of left female breast: Secondary | ICD-10-CM | POA: Diagnosis not present

## 2017-05-22 DIAGNOSIS — Z1509 Genetic susceptibility to other malignant neoplasm: Secondary | ICD-10-CM

## 2017-05-22 DIAGNOSIS — Z170 Estrogen receptor positive status [ER+]: Principal | ICD-10-CM

## 2017-05-22 DIAGNOSIS — Z5111 Encounter for antineoplastic chemotherapy: Secondary | ICD-10-CM

## 2017-05-22 DIAGNOSIS — R42 Dizziness and giddiness: Secondary | ICD-10-CM

## 2017-05-22 DIAGNOSIS — R748 Abnormal levels of other serum enzymes: Secondary | ICD-10-CM

## 2017-05-22 DIAGNOSIS — Z95828 Presence of other vascular implants and grafts: Secondary | ICD-10-CM

## 2017-05-22 DIAGNOSIS — Z1501 Genetic susceptibility to malignant neoplasm of breast: Secondary | ICD-10-CM

## 2017-05-22 LAB — CBC WITH DIFFERENTIAL/PLATELET
BASO%: 0.2 % (ref 0.0–2.0)
BASOS ABS: 0 10*3/uL (ref 0.0–0.1)
EOS ABS: 0.1 10*3/uL (ref 0.0–0.5)
EOS%: 0.9 % (ref 0.0–7.0)
HCT: 31.7 % — ABNORMAL LOW (ref 34.8–46.6)
HGB: 10.6 g/dL — ABNORMAL LOW (ref 11.6–15.9)
LYMPH%: 21.2 % (ref 14.0–49.7)
MCH: 31.4 pg (ref 25.1–34.0)
MCHC: 33.4 g/dL (ref 31.5–36.0)
MCV: 93.8 fL (ref 79.5–101.0)
MONO#: 0.5 10*3/uL (ref 0.1–0.9)
MONO%: 5.7 % (ref 0.0–14.0)
NEUT#: 5.9 10*3/uL (ref 1.5–6.5)
NEUT%: 72 % (ref 38.4–76.8)
Platelets: 231 10*3/uL (ref 145–400)
RBC: 3.38 10*6/uL — AB (ref 3.70–5.45)
RDW: 18.2 % — AB (ref 11.2–14.5)
WBC: 8.2 10*3/uL (ref 3.9–10.3)
lymph#: 1.7 10*3/uL (ref 0.9–3.3)

## 2017-05-22 LAB — COMPREHENSIVE METABOLIC PANEL
ALT: 102 U/L — AB (ref 0–55)
ANION GAP: 12 meq/L — AB (ref 3–11)
AST: 44 U/L — AB (ref 5–34)
Albumin: 3.7 g/dL (ref 3.5–5.0)
Alkaline Phosphatase: 82 U/L (ref 40–150)
BILIRUBIN TOTAL: 0.3 mg/dL (ref 0.20–1.20)
BUN: 19.3 mg/dL (ref 7.0–26.0)
CALCIUM: 9.2 mg/dL (ref 8.4–10.4)
CHLORIDE: 107 meq/L (ref 98–109)
CO2: 22 meq/L (ref 22–29)
CREATININE: 0.8 mg/dL (ref 0.6–1.1)
EGFR: >60 mL/min/{1.73_m2} (ref >60)
Glucose: 111 mg/dl (ref 70–140)
Potassium: 3.6 mEq/L (ref 3.5–5.1)
Sodium: 141 mEq/L (ref 136–145)
TOTAL PROTEIN: 6.9 g/dL (ref 6.4–8.3)

## 2017-05-22 MED ORDER — DEXAMETHASONE SODIUM PHOSPHATE 10 MG/ML IJ SOLN
10.0000 mg | Freq: Once | INTRAMUSCULAR | Status: AC
  Administered 2017-05-22: 10 mg via INTRAVENOUS

## 2017-05-22 MED ORDER — PALONOSETRON HCL INJECTION 0.25 MG/5ML
INTRAVENOUS | Status: AC
  Filled 2017-05-22: qty 5

## 2017-05-22 MED ORDER — SODIUM CHLORIDE 0.9% FLUSH
10.0000 mL | INTRAVENOUS | Status: DC | PRN
  Administered 2017-05-22: 10 mL
  Filled 2017-05-22: qty 10

## 2017-05-22 MED ORDER — PALONOSETRON HCL INJECTION 0.25 MG/5ML
0.2500 mg | Freq: Once | INTRAVENOUS | Status: AC
  Administered 2017-05-22: 0.25 mg via INTRAVENOUS

## 2017-05-22 MED ORDER — FAMOTIDINE IN NACL 20-0.9 MG/50ML-% IV SOLN
INTRAVENOUS | Status: AC
  Filled 2017-05-22: qty 50

## 2017-05-22 MED ORDER — SODIUM CHLORIDE 0.9 % IV SOLN
Freq: Once | INTRAVENOUS | Status: AC
  Administered 2017-05-22: 15:00:00 via INTRAVENOUS

## 2017-05-22 MED ORDER — SODIUM CHLORIDE 0.9 % IV SOLN
223.8000 mg | Freq: Once | INTRAVENOUS | Status: AC
  Administered 2017-05-22: 220 mg via INTRAVENOUS
  Filled 2017-05-22: qty 22

## 2017-05-22 MED ORDER — SODIUM CHLORIDE 0.9 % IV SOLN
80.0000 mg/m2 | Freq: Once | INTRAVENOUS | Status: AC
  Administered 2017-05-22: 156 mg via INTRAVENOUS
  Filled 2017-05-22: qty 26

## 2017-05-22 MED ORDER — DIPHENHYDRAMINE HCL 50 MG/ML IJ SOLN
50.0000 mg | Freq: Once | INTRAMUSCULAR | Status: AC
  Administered 2017-05-22: 50 mg via INTRAVENOUS

## 2017-05-22 MED ORDER — SODIUM CHLORIDE 0.9% FLUSH
10.0000 mL | Freq: Once | INTRAVENOUS | Status: AC
  Administered 2017-05-22: 10 mL
  Filled 2017-05-22: qty 10

## 2017-05-22 MED ORDER — FAMOTIDINE IN NACL 20-0.9 MG/50ML-% IV SOLN
20.0000 mg | Freq: Once | INTRAVENOUS | Status: AC
  Administered 2017-05-22: 20 mg via INTRAVENOUS

## 2017-05-22 MED ORDER — DIPHENHYDRAMINE HCL 50 MG/ML IJ SOLN
INTRAMUSCULAR | Status: AC
  Filled 2017-05-22: qty 1

## 2017-05-22 MED ORDER — DEXAMETHASONE SODIUM PHOSPHATE 10 MG/ML IJ SOLN
INTRAMUSCULAR | Status: AC
  Filled 2017-05-22: qty 1

## 2017-05-22 MED ORDER — HEPARIN SOD (PORK) LOCK FLUSH 100 UNIT/ML IV SOLN
500.0000 [IU] | Freq: Once | INTRAVENOUS | Status: AC | PRN
  Administered 2017-05-22: 500 [IU]
  Filled 2017-05-22: qty 5

## 2017-05-22 NOTE — Patient Instructions (Signed)
[facility] Discharge Instructions for Patients Receiving Chemotherapy  Today you received the following chemotherapy agents:  Taxol.  To help prevent nausea and vomiting after your treatment, we encourage you to take your nausea medication as directed.   If you develop nausea and vomiting that is not controlled by your nausea medication, call the clinic.   BELOW ARE SYMPTOMS THAT SHOULD BE REPORTED IMMEDIATELY:  *FEVER GREATER THAN 100.5 F  *CHILLS WITH OR WITHOUT FEVER  NAUSEA AND VOMITING THAT IS NOT CONTROLLED WITH YOUR NAUSEA MEDICATION  *UNUSUAL SHORTNESS OF BREATH  *UNUSUAL BRUISING OR BLEEDING  TENDERNESS IN MOUTH AND THROAT WITH OR WITHOUT PRESENCE OF ULCERS  *URINARY PROBLEMS  *BOWEL PROBLEMS  UNUSUAL RASH Items with * indicate a potential emergency and should be followed up as soon as possible.  Feel free to call the clinic should you have any questions or concerns. The clinic phone number is [phone].  Please show the CHEMO ALERT CARD at check-in to the Emergency Department and triage nurse.   

## 2017-05-22 NOTE — Patient Instructions (Signed)
Implanted Port Home Guide An implanted port is a type of central line that is placed under the skin. Central lines are used to provide IV access when treatment or nutrition needs to be given through a person's veins. Implanted ports are used for long-term IV access. An implanted port may be placed because:  You need IV medicine that would be irritating to the small veins in your hands or arms.  You need long-term IV medicines, such as antibiotics.  You need IV nutrition for a long period.  You need frequent blood draws for lab tests.  You need dialysis.  Implanted ports are usually placed in the chest area, but they can also be placed in the upper arm, the abdomen, or the leg. An implanted port has two main parts:  Reservoir. The reservoir is round and will appear as a small, raised area under your skin. The reservoir is the part where a needle is inserted to give medicines or draw blood.  Catheter. The catheter is a thin, flexible tube that extends from the reservoir. The catheter is placed into a large vein. Medicine that is inserted into the reservoir goes into the catheter and then into the vein.  How will I care for my incision site? Do not get the incision site wet. Bathe or shower as directed by your health care provider. How is my port accessed? Special steps must be taken to access the port:  Before the port is accessed, a numbing cream can be placed on the skin. This helps numb the skin over the port site.  Your health care provider uses a sterile technique to access the port. ? Your health care provider must put on a mask and sterile gloves. ? The skin over your port is cleaned carefully with an antiseptic and allowed to dry. ? The port is gently pinched between sterile gloves, and a needle is inserted into the port.  Only "non-coring" port needles should be used to access the port. Once the port is accessed, a blood return should be checked. This helps ensure that the port  is in the vein and is not clogged.  If your port needs to remain accessed for a constant infusion, a clear (transparent) bandage will be placed over the needle site. The bandage and needle will need to be changed every week, or as directed by your health care provider.  Keep the bandage covering the needle clean and dry. Do not get it wet. Follow your health care provider's instructions on how to take a shower or bath while the port is accessed.  If your port does not need to stay accessed, no bandage is needed over the port.  What is flushing? Flushing helps keep the port from getting clogged. Follow your health care provider's instructions on how and when to flush the port. Ports are usually flushed with saline solution or a medicine called heparin. The need for flushing will depend on how the port is used.  If the port is used for intermittent medicines or blood draws, the port will need to be flushed: ? After medicines have been given. ? After blood has been drawn. ? As part of routine maintenance.  If a constant infusion is running, the port may not need to be flushed.  How long will my port stay implanted? The port can stay in for as long as your health care provider thinks it is needed. When it is time for the port to come out, surgery will be   done to remove it. The procedure is similar to the one performed when the port was put in. When should I seek immediate medical care? When you have an implanted port, you should seek immediate medical care if:  You notice a bad smell coming from the incision site.  You have swelling, redness, or drainage at the incision site.  You have more swelling or pain at the port site or the surrounding area.  You have a fever that is not controlled with medicine.  This information is not intended to replace advice given to you by your health care provider. Make sure you discuss any questions you have with your health care provider. Document  Released: 05/26/2005 Document Revised: 11/01/2015 Document Reviewed: 01/31/2013 Elsevier Interactive Patient Education  2017 [employer]  

## 2017-05-22 NOTE — Progress Notes (Signed)
Per Dr. Burr Medico, ok to treat with ALT of 102 today. Carboplatin dose will be reduced.

## 2017-05-23 ENCOUNTER — Telehealth: Payer: Self-pay | Admitting: Hematology

## 2017-05-23 NOTE — Telephone Encounter (Signed)
Patient to also contact [facility] for mri.

## 2017-05-23 NOTE — Telephone Encounter (Signed)
Added appointments for 12/31. Spoke with spouse. Patient will get updated schedule at next visit.

## 2017-05-25 ENCOUNTER — Ambulatory Visit (HOSPITAL_BASED_OUTPATIENT_CLINIC_OR_DEPARTMENT_OTHER): Payer: Medicaid Other

## 2017-05-25 VITALS — BP 132/90 | HR 95 | Temp 98.0°F | Resp 18

## 2017-05-25 DIAGNOSIS — C50412 Malignant neoplasm of upper-outer quadrant of left female breast: Secondary | ICD-10-CM

## 2017-05-25 DIAGNOSIS — Z170 Estrogen receptor positive status [ER+]: Secondary | ICD-10-CM

## 2017-05-25 DIAGNOSIS — Z5189 Encounter for other specified aftercare: Secondary | ICD-10-CM

## 2017-05-25 DIAGNOSIS — Z95828 Presence of other vascular implants and grafts: Secondary | ICD-10-CM

## 2017-05-25 MED ORDER — TBO-FILGRASTIM 480 MCG/0.8ML ~~LOC~~ SOSY
480.0000 ug | PREFILLED_SYRINGE | Freq: Once | SUBCUTANEOUS | Status: AC
  Administered 2017-05-25: 480 ug via SUBCUTANEOUS

## 2017-05-25 MED ORDER — TBO-FILGRASTIM 480 MCG/0.8ML ~~LOC~~ SOSY
PREFILLED_SYRINGE | SUBCUTANEOUS | Status: AC
  Filled 2017-05-25: qty 0.8

## 2017-05-25 NOTE — Patient Instructions (Signed)
Tbo-Filgrastim injection What is this medicine? TBO-FILGRASTIM (T B O fil GRA stim) is a granulocyte colony-stimulating factor that stimulates the growth of neutrophils, a type of white blood cell important in the body's fight against infection. It is used to reduce the incidence of fever and infection in patients with certain types of cancer who are receiving chemotherapy that affects the bone marrow. This medicine may be used for other purposes; ask your health care provider or pharmacist if you have questions. COMMON BRAND NAME(S): Granix What should I tell my health care provider before I take this medicine? They need to know if you have any of these conditions: -bone scan or tests planned -kidney disease -sickle cell anemia -an unusual or allergic reaction to tbo-filgrastim, filgrastim, pegfilgrastim, other medicines, foods, dyes, or preservatives -pregnant or trying to get pregnant -breast-feeding How should I use this medicine? This medicine is for injection under the skin. If you get this medicine at home, you will be taught how to prepare and give this medicine. Refer to the Instructions for Use that come with your medication packaging. Use exactly as directed. Take your medicine at regular intervals. Do not take your medicine more often than directed. It is important that you put your used needles and syringes in a special sharps container. Do not put them in a trash can. If you do not have a sharps container, call your pharmacist or healthcare provider to get one. Talk to your pediatrician regarding the use of this medicine in children. Special care may be needed. Overdosage: If you think you have taken too much of this medicine contact a poison control center or emergency room at once. NOTE: This medicine is only for you. Do not share this medicine with others. What if I miss a dose? It is important not to miss your dose. Call your doctor or health care professional if you miss a  dose. What may interact with this medicine? This medicine may interact with the following medications: -medicines that may cause a release of neutrophils, such as lithium This list may not describe all possible interactions. Give your health care provider a list of all the medicines, herbs, non-prescription drugs, or dietary supplements you use. Also tell them if you smoke, drink alcohol, or use illegal drugs. Some items may interact with your medicine. What should I watch for while using this medicine? You may need blood work done while you are taking this medicine. What side effects may I notice from receiving this medicine? Side effects that you should report to your doctor or health care professional as soon as possible: -allergic reactions like skin rash, itching or hives, swelling of the face, lips, or tongue -blood in the urine -dark urine -dizziness -fast heartbeat -feeling faint -shortness of breath or breathing problems -signs and symptoms of infection like fever or chills; cough; or sore throat -signs and symptoms of kidney injury like trouble passing urine or change in the amount of urine -stomach or side pain, or pain at the shoulder -sweating -swelling of the legs, ankles, or abdomen -tiredness Side effects that usually do not require medical attention (report to your doctor or health care professional if they continue or are bothersome): -bone pain -headache -muscle pain -vomiting This list may not describe all possible side effects. Call your doctor for medical advice about side effects. You may report side effects to FDA at 1-800-FDA-1088. Where should I keep my medicine? Keep out of the reach of children. Store in a refrigerator between   2 and 8 degrees C (36 and 46 degrees F). Keep in carton to protect from light. Throw away this medicine if it is left out of the refrigerator for more than 5 consecutive days. Throw away any unused medicine after the expiration  date. NOTE: This sheet is a summary. It may not cover all possible information. If you have questions about this medicine, talk to your doctor, pharmacist, or health care provider.  2018 Elsevier/Gold Standard (2015-07-16 19:07:04)  

## 2017-05-29 ENCOUNTER — Ambulatory Visit (HOSPITAL_BASED_OUTPATIENT_CLINIC_OR_DEPARTMENT_OTHER): Payer: Medicaid Other

## 2017-05-29 ENCOUNTER — Other Ambulatory Visit: Payer: Self-pay | Admitting: General Surgery

## 2017-05-29 ENCOUNTER — Other Ambulatory Visit (HOSPITAL_BASED_OUTPATIENT_CLINIC_OR_DEPARTMENT_OTHER): Payer: Medicaid Other

## 2017-05-29 ENCOUNTER — Ambulatory Visit: Payer: Medicaid Other

## 2017-05-29 VITALS — BP 128/79 | HR 85 | Temp 98.6°F | Resp 18 | Wt 198.0 lb

## 2017-05-29 DIAGNOSIS — Z5111 Encounter for antineoplastic chemotherapy: Secondary | ICD-10-CM

## 2017-05-29 DIAGNOSIS — C50412 Malignant neoplasm of upper-outer quadrant of left female breast: Secondary | ICD-10-CM | POA: Diagnosis not present

## 2017-05-29 DIAGNOSIS — Z170 Estrogen receptor positive status [ER+]: Principal | ICD-10-CM

## 2017-05-29 DIAGNOSIS — Z95828 Presence of other vascular implants and grafts: Secondary | ICD-10-CM

## 2017-05-29 DIAGNOSIS — C50212 Malignant neoplasm of upper-inner quadrant of left female breast: Secondary | ICD-10-CM

## 2017-05-29 LAB — CBC WITH DIFFERENTIAL/PLATELET
BASO%: 1 % (ref 0.0–2.0)
BASOS ABS: 0 10*3/uL (ref 0.0–0.1)
EOS ABS: 0 10*3/uL (ref 0.0–0.5)
EOS%: 0.7 % (ref 0.0–7.0)
HEMATOCRIT: 31.4 % — AB (ref 34.8–46.6)
HEMOGLOBIN: 10.8 g/dL — AB (ref 11.6–15.9)
LYMPH#: 1.2 10*3/uL (ref 0.9–3.3)
LYMPH%: 44.5 % (ref 14.0–49.7)
MCH: 31.7 pg (ref 25.1–34.0)
MCHC: 34.3 g/dL (ref 31.5–36.0)
MCV: 92.4 fL (ref 79.5–101.0)
MONO#: 0.4 10*3/uL (ref 0.1–0.9)
MONO%: 13.1 % (ref 0.0–14.0)
NEUT#: 1.1 10*3/uL — ABNORMAL LOW (ref 1.5–6.5)
NEUT%: 40.7 % (ref 38.4–76.8)
PLATELETS: 300 10*3/uL (ref 145–400)
RBC: 3.4 10*6/uL — ABNORMAL LOW (ref 3.70–5.45)
RDW: 19.3 % — ABNORMAL HIGH (ref 11.2–14.5)
WBC: 2.8 10*3/uL — ABNORMAL LOW (ref 3.9–10.3)

## 2017-05-29 LAB — COMPREHENSIVE METABOLIC PANEL
ALT: 41 U/L (ref 0–55)
AST: 22 U/L (ref 5–34)
Albumin: 3.7 g/dL (ref 3.5–5.0)
Alkaline Phosphatase: 70 U/L (ref 40–150)
Anion Gap: 8 mEq/L (ref 3–11)
BILIRUBIN TOTAL: 0.23 mg/dL (ref 0.20–1.20)
BUN: 15.8 mg/dL (ref 7.0–26.0)
CHLORIDE: 109 meq/L (ref 98–109)
CO2: 23 meq/L (ref 22–29)
Calcium: 8.6 mg/dL (ref 8.4–10.4)
Creatinine: 0.9 mg/dL (ref 0.6–1.1)
GLUCOSE: 136 mg/dL (ref 70–140)
Potassium: 3.5 mEq/L (ref 3.5–5.1)
SODIUM: 140 meq/L (ref 136–145)
TOTAL PROTEIN: 6.6 g/dL (ref 6.4–8.3)

## 2017-05-29 MED ORDER — SODIUM CHLORIDE 0.9% FLUSH
10.0000 mL | Freq: Once | INTRAVENOUS | Status: AC
  Administered 2017-05-29: 10 mL
  Filled 2017-05-29: qty 10

## 2017-05-29 MED ORDER — SODIUM CHLORIDE 0.9% FLUSH
10.0000 mL | INTRAVENOUS | Status: DC | PRN
  Administered 2017-05-29: 10 mL
  Filled 2017-05-29: qty 10

## 2017-05-29 MED ORDER — FAMOTIDINE IN NACL 20-0.9 MG/50ML-% IV SOLN
20.0000 mg | Freq: Once | INTRAVENOUS | Status: AC
  Administered 2017-05-29: 20 mg via INTRAVENOUS

## 2017-05-29 MED ORDER — SODIUM CHLORIDE 0.9 % IV SOLN
Freq: Once | INTRAVENOUS | Status: AC
  Administered 2017-05-29: 14:00:00 via INTRAVENOUS

## 2017-05-29 MED ORDER — DIPHENHYDRAMINE HCL 50 MG/ML IJ SOLN
INTRAMUSCULAR | Status: AC
  Filled 2017-05-29: qty 1

## 2017-05-29 MED ORDER — DEXAMETHASONE SODIUM PHOSPHATE 10 MG/ML IJ SOLN
INTRAMUSCULAR | Status: AC
Start: 2017-05-29 — End: 2017-05-29
  Filled 2017-05-29: qty 1

## 2017-05-29 MED ORDER — PALONOSETRON HCL INJECTION 0.25 MG/5ML
INTRAVENOUS | Status: AC
Start: 2017-05-29 — End: 2017-05-29
  Filled 2017-05-29: qty 5

## 2017-05-29 MED ORDER — SODIUM CHLORIDE 0.9 % IV SOLN
80.0000 mg/m2 | Freq: Once | INTRAVENOUS | Status: AC
  Administered 2017-05-29: 156 mg via INTRAVENOUS
  Filled 2017-05-29: qty 26

## 2017-05-29 MED ORDER — DIPHENHYDRAMINE HCL 50 MG/ML IJ SOLN
50.0000 mg | Freq: Once | INTRAMUSCULAR | Status: AC
  Administered 2017-05-29: 50 mg via INTRAVENOUS

## 2017-05-29 MED ORDER — HEPARIN SOD (PORK) LOCK FLUSH 100 UNIT/ML IV SOLN
500.0000 [IU] | Freq: Once | INTRAVENOUS | Status: AC | PRN
  Administered 2017-05-29: 500 [IU]
  Filled 2017-05-29: qty 5

## 2017-05-29 MED ORDER — DEXAMETHASONE SODIUM PHOSPHATE 10 MG/ML IJ SOLN
10.0000 mg | Freq: Once | INTRAMUSCULAR | Status: AC
  Administered 2017-05-29: 10 mg via INTRAVENOUS

## 2017-05-29 MED ORDER — FAMOTIDINE IN NACL 20-0.9 MG/50ML-% IV SOLN
INTRAVENOUS | Status: AC
  Filled 2017-05-29: qty 50

## 2017-05-29 MED ORDER — PALONOSETRON HCL INJECTION 0.25 MG/5ML
0.2500 mg | Freq: Once | INTRAVENOUS | Status: AC
  Administered 2017-05-29: 0.25 mg via INTRAVENOUS

## 2017-05-29 NOTE — Progress Notes (Signed)
Pt reports body aches that seem to correlate with " Carboplatin and Granix treatments", Left back molar "crack" and cold sensitivity, Nose bleeds when blowing her nose and ankle swelling. WBC 2.8, ANC 1.1. Per Dr. Benay Spice proceed with Taxol, hold Carboplatin, Pt to have Granix and lab recheck 06/01/17. Per Dr. Benay Spice have pt consult her dentist regarding the tooth. Pt is to monitor for signs and symptoms or infection (and pt also aware to not go to dentist if any s/s of infection develop). Pt verbalizes understanding.

## 2017-05-29 NOTE — Patient Instructions (Signed)
[facility] Discharge Instructions for Patients Receiving Chemotherapy  Today you received the following chemotherapy agents:  Taxol.  To help prevent nausea and vomiting after your treatment, we encourage you to take your nausea medication as directed.   If you develop nausea and vomiting that is not controlled by your nausea medication, call the clinic.   BELOW ARE SYMPTOMS THAT SHOULD BE REPORTED IMMEDIATELY:  *FEVER GREATER THAN 100.5 F  *CHILLS WITH OR WITHOUT FEVER  NAUSEA AND VOMITING THAT IS NOT CONTROLLED WITH YOUR NAUSEA MEDICATION  *UNUSUAL SHORTNESS OF BREATH  *UNUSUAL BRUISING OR BLEEDING  TENDERNESS IN MOUTH AND THROAT WITH OR WITHOUT PRESENCE OF ULCERS  *URINARY PROBLEMS  *BOWEL PROBLEMS  UNUSUAL RASH Items with * indicate a potential emergency and should be followed up as soon as possible.  Feel free to call the clinic should you have any questions or concerns. The clinic phone number is [phone].  Please show the CHEMO ALERT CARD at check-in to the Emergency Department and triage nurse.   

## 2017-05-29 NOTE — Patient Instructions (Signed)
Implanted Port Home Guide An implanted port is a type of central line that is placed under the skin. Central lines are used to provide IV access when treatment or nutrition needs to be given through a person's veins. Implanted ports are used for long-term IV access. An implanted port may be placed because:  You need IV medicine that would be irritating to the small veins in your hands or arms.  You need long-term IV medicines, such as antibiotics.  You need IV nutrition for a long period.  You need frequent blood draws for lab tests.  You need dialysis.  Implanted ports are usually placed in the chest area, but they can also be placed in the upper arm, the abdomen, or the leg. An implanted port has two main parts:  Reservoir. The reservoir is round and will appear as a small, raised area under your skin. The reservoir is the part where a needle is inserted to give medicines or draw blood.  Catheter. The catheter is a thin, flexible tube that extends from the reservoir. The catheter is placed into a large vein. Medicine that is inserted into the reservoir goes into the catheter and then into the vein.  How will I care for my incision site? Do not get the incision site wet. Bathe or shower as directed by your health care provider. How is my port accessed? Special steps must be taken to access the port:  Before the port is accessed, a numbing cream can be placed on the skin. This helps numb the skin over the port site.  Your health care provider uses a sterile technique to access the port. ? Your health care provider must put on a mask and sterile gloves. ? The skin over your port is cleaned carefully with an antiseptic and allowed to dry. ? The port is gently pinched between sterile gloves, and a needle is inserted into the port.  Only "non-coring" port needles should be used to access the port. Once the port is accessed, a blood return should be checked. This helps ensure that the port  is in the vein and is not clogged.  If your port needs to remain accessed for a constant infusion, a clear (transparent) bandage will be placed over the needle site. The bandage and needle will need to be changed every week, or as directed by your health care provider.  Keep the bandage covering the needle clean and dry. Do not get it wet. Follow your health care provider's instructions on how to take a shower or bath while the port is accessed.  If your port does not need to stay accessed, no bandage is needed over the port.  What is flushing? Flushing helps keep the port from getting clogged. Follow your health care provider's instructions on how and when to flush the port. Ports are usually flushed with saline solution or a medicine called heparin. The need for flushing will depend on how the port is used.  If the port is used for intermittent medicines or blood draws, the port will need to be flushed: ? After medicines have been given. ? After blood has been drawn. ? As part of routine maintenance.  If a constant infusion is running, the port may not need to be flushed.  How long will my port stay implanted? The port can stay in for as long as your health care provider thinks it is needed. When it is time for the port to come out, surgery will be   done to remove it. The procedure is similar to the one performed when the port was put in. When should I seek immediate medical care? When you have an implanted port, you should seek immediate medical care if:  You notice a bad smell coming from the incision site.  You have swelling, redness, or drainage at the incision site.  You have more swelling or pain at the port site or the surrounding area.  You have a fever that is not controlled with medicine.  This information is not intended to replace advice given to you by your health care provider. Make sure you discuss any questions you have with your health care provider. Document  Released: 05/26/2005 Document Revised: 11/01/2015 Document Reviewed: 01/31/2013 Elsevier Interactive Patient Education  2017 [employer]  

## 2017-06-01 ENCOUNTER — Ambulatory Visit (HOSPITAL_BASED_OUTPATIENT_CLINIC_OR_DEPARTMENT_OTHER): Payer: Medicaid Other

## 2017-06-01 ENCOUNTER — Other Ambulatory Visit (HOSPITAL_BASED_OUTPATIENT_CLINIC_OR_DEPARTMENT_OTHER): Payer: Medicaid Other

## 2017-06-01 VITALS — BP 128/83 | HR 89 | Temp 97.7°F | Resp 18

## 2017-06-01 DIAGNOSIS — Z170 Estrogen receptor positive status [ER+]: Secondary | ICD-10-CM

## 2017-06-01 DIAGNOSIS — C50412 Malignant neoplasm of upper-outer quadrant of left female breast: Secondary | ICD-10-CM

## 2017-06-01 DIAGNOSIS — Z5189 Encounter for other specified aftercare: Secondary | ICD-10-CM | POA: Diagnosis not present

## 2017-06-01 DIAGNOSIS — Z95828 Presence of other vascular implants and grafts: Secondary | ICD-10-CM

## 2017-06-01 LAB — CBC WITH DIFFERENTIAL/PLATELET
BASO%: 1.2 % (ref 0.0–2.0)
BASOS ABS: 0 10*3/uL (ref 0.0–0.1)
EOS ABS: 0 10*3/uL (ref 0.0–0.5)
EOS%: 0.2 % (ref 0.0–7.0)
HCT: 33.8 % — ABNORMAL LOW (ref 34.8–46.6)
HGB: 11.5 g/dL — ABNORMAL LOW (ref 11.6–15.9)
LYMPH%: 48.4 % (ref 14.0–49.7)
MCH: 31.3 pg (ref 25.1–34.0)
MCHC: 33.9 g/dL (ref 31.5–36.0)
MCV: 92.5 fL (ref 79.5–101.0)
MONO#: 0.2 10*3/uL (ref 0.1–0.9)
MONO%: 12.1 % (ref 0.0–14.0)
NEUT#: 0.6 10*3/uL — ABNORMAL LOW (ref 1.5–6.5)
NEUT%: 38.1 % — AB (ref 38.4–76.8)
PLATELETS: 247 10*3/uL (ref 145–400)
RBC: 3.66 10*6/uL — AB (ref 3.70–5.45)
RDW: 18.9 % — ABNORMAL HIGH (ref 11.2–14.5)
WBC: 1.7 10*3/uL — ABNORMAL LOW (ref 3.9–10.3)
lymph#: 0.8 10*3/uL — ABNORMAL LOW (ref 0.9–3.3)

## 2017-06-01 LAB — COMPREHENSIVE METABOLIC PANEL
ALBUMIN: 3.6 g/dL (ref 3.5–5.0)
ALK PHOS: 60 U/L (ref 40–150)
ALT: 34 U/L (ref 0–55)
ANION GAP: 11 meq/L (ref 3–11)
AST: 15 U/L (ref 5–34)
BUN: 19.9 mg/dL (ref 7.0–26.0)
CALCIUM: 8.8 mg/dL (ref 8.4–10.4)
CO2: 22 mEq/L (ref 22–29)
Chloride: 106 mEq/L (ref 98–109)
Creatinine: 0.8 mg/dL (ref 0.6–1.1)
Glucose: 106 mg/dl (ref 70–140)
POTASSIUM: 3.7 meq/L (ref 3.5–5.1)
SODIUM: 139 meq/L (ref 136–145)
Total Bilirubin: 0.27 mg/dL (ref 0.20–1.20)
Total Protein: 6.4 g/dL (ref 6.4–8.3)

## 2017-06-01 MED ORDER — TBO-FILGRASTIM 480 MCG/0.8ML ~~LOC~~ SOSY
480.0000 ug | PREFILLED_SYRINGE | Freq: Once | SUBCUTANEOUS | Status: AC
  Administered 2017-06-01: 480 ug via SUBCUTANEOUS

## 2017-06-01 NOTE — Patient Instructions (Signed)
Tbo-Filgrastim injection What is this medicine? TBO-FILGRASTIM (T B O fil GRA stim) is a granulocyte colony-stimulating factor that stimulates the growth of neutrophils, a type of white blood cell important in the body's fight against infection. It is used to reduce the incidence of fever and infection in patients with certain types of cancer who are receiving chemotherapy that affects the bone marrow. This medicine may be used for other purposes; ask your health care provider or pharmacist if you have questions. COMMON BRAND NAME(S): Granix What should I tell my health care provider before I take this medicine? They need to know if you have any of these conditions: -bone scan or tests planned -kidney disease -sickle cell anemia -an unusual or allergic reaction to tbo-filgrastim, filgrastim, pegfilgrastim, other medicines, foods, dyes, or preservatives -pregnant or trying to get pregnant -breast-feeding How should I use this medicine? This medicine is for injection under the skin. If you get this medicine at home, you will be taught how to prepare and give this medicine. Refer to the Instructions for Use that come with your medication packaging. Use exactly as directed. Take your medicine at regular intervals. Do not take your medicine more often than directed. It is important that you put your used needles and syringes in a special sharps container. Do not put them in a trash can. If you do not have a sharps container, call your pharmacist or healthcare provider to get one. Talk to your pediatrician regarding the use of this medicine in children. Special care may be needed. Overdosage: If you think you have taken too much of this medicine contact a poison control center or emergency room at once. NOTE: This medicine is only for you. Do not share this medicine with others. What if I miss a dose? It is important not to miss your dose. Call your doctor or health care professional if you miss a  dose. What may interact with this medicine? This medicine may interact with the following medications: -medicines that may cause a release of neutrophils, such as lithium This list may not describe all possible interactions. Give your health care provider a list of all the medicines, herbs, non-prescription drugs, or dietary supplements you use. Also tell them if you smoke, drink alcohol, or use illegal drugs. Some items may interact with your medicine. What should I watch for while using this medicine? You may need blood work done while you are taking this medicine. What side effects may I notice from receiving this medicine? Side effects that you should report to your doctor or health care professional as soon as possible: -allergic reactions like skin rash, itching or hives, swelling of the face, lips, or tongue -blood in the urine -dark urine -dizziness -fast heartbeat -feeling faint -shortness of breath or breathing problems -signs and symptoms of infection like fever or chills; cough; or sore throat -signs and symptoms of kidney injury like trouble passing urine or change in the amount of urine -stomach or side pain, or pain at the shoulder -sweating -swelling of the legs, ankles, or abdomen -tiredness Side effects that usually do not require medical attention (report to your doctor or health care professional if they continue or are bothersome): -bone pain -headache -muscle pain -vomiting This list may not describe all possible side effects. Call your doctor for medical advice about side effects. You may report side effects to FDA at 1-800-FDA-1088. Where should I keep my medicine? Keep out of the reach of children. Store in a refrigerator between   2 and 8 degrees C (36 and 46 degrees F). Keep in carton to protect from light. Throw away this medicine if it is left out of the refrigerator for more than 5 consecutive days. Throw away any unused medicine after the expiration  date. NOTE: This sheet is a summary. It may not cover all possible information. If you have questions about this medicine, talk to your doctor, pharmacist, or health care provider.  2018 Elsevier/Gold Standard (2015-07-16 19:07:04)  

## 2017-06-04 ENCOUNTER — Encounter: Payer: Self-pay | Admitting: Hematology

## 2017-06-05 ENCOUNTER — Telehealth: Payer: Self-pay | Admitting: *Deleted

## 2017-06-05 ENCOUNTER — Ambulatory Visit (HOSPITAL_BASED_OUTPATIENT_CLINIC_OR_DEPARTMENT_OTHER): Payer: Medicaid Other | Admitting: Hematology

## 2017-06-05 ENCOUNTER — Ambulatory Visit (HOSPITAL_COMMUNITY)
Admission: RE | Admit: 2017-06-05 | Discharge: 2017-06-05 | Disposition: A | Discharge status: Home / Self Care | Payer: Medicaid Other | Source: Ambulatory Visit | Attending: Hematology | Admitting: Hematology

## 2017-06-05 ENCOUNTER — Encounter: Payer: Self-pay | Admitting: Hematology

## 2017-06-05 ENCOUNTER — Telehealth: Payer: Self-pay | Admitting: Hematology

## 2017-06-05 ENCOUNTER — Ambulatory Visit: Payer: Medicaid Other

## 2017-06-05 ENCOUNTER — Other Ambulatory Visit: Payer: Self-pay | Admitting: General Surgery

## 2017-06-05 ENCOUNTER — Ambulatory Visit (HOSPITAL_BASED_OUTPATIENT_CLINIC_OR_DEPARTMENT_OTHER): Payer: Medicaid Other

## 2017-06-05 ENCOUNTER — Other Ambulatory Visit (HOSPITAL_BASED_OUTPATIENT_CLINIC_OR_DEPARTMENT_OTHER): Payer: Medicaid Other

## 2017-06-05 VITALS — BP 120/77 | HR 97 | Temp 98.0°F | Resp 18 | Ht 64.0 in | Wt 201.0 lb

## 2017-06-05 DIAGNOSIS — Z95828 Presence of other vascular implants and grafts: Secondary | ICD-10-CM

## 2017-06-05 DIAGNOSIS — C50212 Malignant neoplasm of upper-inner quadrant of left female breast: Secondary | ICD-10-CM

## 2017-06-05 DIAGNOSIS — C50412 Malignant neoplasm of upper-outer quadrant of left female breast: Secondary | ICD-10-CM

## 2017-06-05 DIAGNOSIS — Z170 Estrogen receptor positive status [ER+]: Principal | ICD-10-CM

## 2017-06-05 DIAGNOSIS — Z1501 Genetic susceptibility to malignant neoplasm of breast: Secondary | ICD-10-CM

## 2017-06-05 DIAGNOSIS — D649 Anemia, unspecified: Secondary | ICD-10-CM | POA: Diagnosis not present

## 2017-06-05 DIAGNOSIS — R600 Localized edema: Secondary | ICD-10-CM

## 2017-06-05 DIAGNOSIS — M7989 Other specified soft tissue disorders: Secondary | ICD-10-CM | POA: Insufficient documentation

## 2017-06-05 DIAGNOSIS — Z5111 Encounter for antineoplastic chemotherapy: Secondary | ICD-10-CM | POA: Diagnosis not present

## 2017-06-05 DIAGNOSIS — Z1379 Encounter for other screening for genetic and chromosomal anomalies: Secondary | ICD-10-CM

## 2017-06-05 DIAGNOSIS — Z1509 Genetic susceptibility to other malignant neoplasm: Secondary | ICD-10-CM

## 2017-06-05 LAB — COMPREHENSIVE METABOLIC PANEL
ALT: 46 U/L (ref 0–55)
AST: 23 U/L (ref 5–34)
Albumin: 3.6 g/dL (ref 3.5–5.0)
Alkaline Phosphatase: 72 U/L (ref 40–150)
Anion Gap: 9 mEq/L (ref 3–11)
BUN: 25.7 mg/dL (ref 7.0–26.0)
CHLORIDE: 109 meq/L (ref 98–109)
CO2: 22 meq/L (ref 22–29)
Calcium: 9 mg/dL (ref 8.4–10.4)
Creatinine: 0.8 mg/dL (ref 0.6–1.1)
EGFR: >60 mL/min/{1.73_m2} (ref >60)
GLUCOSE: 129 mg/dL (ref 70–140)
POTASSIUM: 3.7 meq/L (ref 3.5–5.1)
SODIUM: 140 meq/L (ref 136–145)
Total Bilirubin: 0.22 mg/dL (ref 0.20–1.20)
Total Protein: 6.7 g/dL (ref 6.4–8.3)

## 2017-06-05 LAB — CBC WITH DIFFERENTIAL/PLATELET
BASO%: 1 % (ref 0.0–2.0)
BASOS ABS: 0 10*3/uL (ref 0.0–0.1)
EOS%: 0.4 % (ref 0.0–7.0)
Eosinophils Absolute: 0 10*3/uL (ref 0.0–0.5)
HCT: 33 % — ABNORMAL LOW (ref 34.8–46.6)
HGB: 11.4 g/dL — ABNORMAL LOW (ref 11.6–15.9)
LYMPH%: 28.2 % (ref 14.0–49.7)
MCH: 31.9 pg (ref 25.1–34.0)
MCHC: 34.6 g/dL (ref 31.5–36.0)
MCV: 92.2 fL (ref 79.5–101.0)
MONO#: 0.4 10*3/uL (ref 0.1–0.9)
MONO%: 9.4 % (ref 0.0–14.0)
NEUT#: 2.5 10*3/uL (ref 1.5–6.5)
NEUT%: 61 % (ref 38.4–76.8)
Platelets: 255 10*3/uL (ref 145–400)
RBC: 3.58 10*6/uL — ABNORMAL LOW (ref 3.70–5.45)
RDW: 19.8 % — AB (ref 11.2–14.5)
WBC: 4.1 10*3/uL (ref 3.9–10.3)
lymph#: 1.1 10*3/uL (ref 0.9–3.3)

## 2017-06-05 MED ORDER — PALONOSETRON HCL INJECTION 0.25 MG/5ML
0.2500 mg | Freq: Once | INTRAVENOUS | Status: AC
  Administered 2017-06-05: 0.25 mg via INTRAVENOUS

## 2017-06-05 MED ORDER — SODIUM CHLORIDE 0.9% FLUSH
10.0000 mL | INTRAVENOUS | Status: DC | PRN
  Administered 2017-06-05: 10 mL
  Filled 2017-06-05: qty 10

## 2017-06-05 MED ORDER — FAMOTIDINE IN NACL 20-0.9 MG/50ML-% IV SOLN
INTRAVENOUS | Status: AC
  Filled 2017-06-05: qty 50

## 2017-06-05 MED ORDER — DIPHENHYDRAMINE HCL 50 MG/ML IJ SOLN
INTRAMUSCULAR | Status: AC
  Filled 2017-06-05: qty 1

## 2017-06-05 MED ORDER — SODIUM CHLORIDE 0.9% FLUSH
10.0000 mL | Freq: Once | INTRAVENOUS | Status: AC
  Administered 2017-06-05: 10 mL
  Filled 2017-06-05: qty 10

## 2017-06-05 MED ORDER — SODIUM CHLORIDE 0.9 % IV SOLN
223.8000 mg | Freq: Once | INTRAVENOUS | Status: AC
  Administered 2017-06-05: 220 mg via INTRAVENOUS
  Filled 2017-06-05: qty 22

## 2017-06-05 MED ORDER — FAMOTIDINE IN NACL 20-0.9 MG/50ML-% IV SOLN
20.0000 mg | Freq: Once | INTRAVENOUS | Status: AC
  Administered 2017-06-05: 20 mg via INTRAVENOUS

## 2017-06-05 MED ORDER — PALONOSETRON HCL INJECTION 0.25 MG/5ML
INTRAVENOUS | Status: AC
Start: 2017-06-05 — End: 2017-06-05
  Filled 2017-06-05: qty 5

## 2017-06-05 MED ORDER — DEXAMETHASONE SODIUM PHOSPHATE 10 MG/ML IJ SOLN
10.0000 mg | Freq: Once | INTRAMUSCULAR | Status: AC
  Administered 2017-06-05: 10 mg via INTRAVENOUS

## 2017-06-05 MED ORDER — HEPARIN SOD (PORK) LOCK FLUSH 100 UNIT/ML IV SOLN
500.0000 [IU] | Freq: Once | INTRAVENOUS | Status: AC | PRN
  Administered 2017-06-05: 500 [IU]
  Filled 2017-06-05: qty 5

## 2017-06-05 MED ORDER — DEXAMETHASONE SODIUM PHOSPHATE 10 MG/ML IJ SOLN
INTRAMUSCULAR | Status: AC
Start: 2017-06-05 — End: 2017-06-05
  Filled 2017-06-05: qty 1

## 2017-06-05 MED ORDER — DIPHENHYDRAMINE HCL 50 MG/ML IJ SOLN
50.0000 mg | Freq: Once | INTRAMUSCULAR | Status: AC
  Administered 2017-06-05: 50 mg via INTRAVENOUS

## 2017-06-05 MED ORDER — PACLITAXEL CHEMO INJECTION 300 MG/50ML
80.0000 mg/m2 | Freq: Once | INTRAVENOUS | Status: AC
  Administered 2017-06-05: 156 mg via INTRAVENOUS
  Filled 2017-06-05: qty 26

## 2017-06-05 MED ORDER — SODIUM CHLORIDE 0.9 % IV SOLN
Freq: Once | INTRAVENOUS | Status: AC
  Administered 2017-06-05: 10:00:00 via INTRAVENOUS

## 2017-06-05 NOTE — Patient Instructions (Signed)
Implanted Port Home Guide An implanted port is a type of central line that is placed under the skin. Central lines are used to provide IV access when treatment or nutrition needs to be given through a person's veins. Implanted ports are used for long-term IV access. An implanted port may be placed because:  You need IV medicine that would be irritating to the small veins in your hands or arms.  You need long-term IV medicines, such as antibiotics.  You need IV nutrition for a long period.  You need frequent blood draws for lab tests.  You need dialysis.  Implanted ports are usually placed in the chest area, but they can also be placed in the upper arm, the abdomen, or the leg. An implanted port has two main parts:  Reservoir. The reservoir is round and will appear as a small, raised area under your skin. The reservoir is the part where a needle is inserted to give medicines or draw blood.  Catheter. The catheter is a thin, flexible tube that extends from the reservoir. The catheter is placed into a large vein. Medicine that is inserted into the reservoir goes into the catheter and then into the vein.  How will I care for my incision site? Do not get the incision site wet. Bathe or shower as directed by your health care provider. How is my port accessed? Special steps must be taken to access the port:  Before the port is accessed, a numbing cream can be placed on the skin. This helps numb the skin over the port site.  Your health care provider uses a sterile technique to access the port. ? Your health care provider must put on a mask and sterile gloves. ? The skin over your port is cleaned carefully with an antiseptic and allowed to dry. ? The port is gently pinched between sterile gloves, and a needle is inserted into the port.  Only "non-coring" port needles should be used to access the port. Once the port is accessed, a blood return should be checked. This helps ensure that the port  is in the vein and is not clogged.  If your port needs to remain accessed for a constant infusion, a clear (transparent) bandage will be placed over the needle site. The bandage and needle will need to be changed every week, or as directed by your health care provider.  Keep the bandage covering the needle clean and dry. Do not get it wet. Follow your health care provider's instructions on how to take a shower or bath while the port is accessed.  If your port does not need to stay accessed, no bandage is needed over the port.  What is flushing? Flushing helps keep the port from getting clogged. Follow your health care provider's instructions on how and when to flush the port. Ports are usually flushed with saline solution or a medicine called heparin. The need for flushing will depend on how the port is used.  If the port is used for intermittent medicines or blood draws, the port will need to be flushed: ? After medicines have been given. ? After blood has been drawn. ? As part of routine maintenance.  If a constant infusion is running, the port may not need to be flushed.  How long will my port stay implanted? The port can stay in for as long as your health care provider thinks it is needed. When it is time for the port to come out, surgery will be   done to remove it. The procedure is similar to the one performed when the port was put in. When should I seek immediate medical care? When you have an implanted port, you should seek immediate medical care if:  You notice a bad smell coming from the incision site.  You have swelling, redness, or drainage at the incision site.  You have more swelling or pain at the port site or the surrounding area.  You have a fever that is not controlled with medicine.  This information is not intended to replace advice given to you by your health care provider. Make sure you discuss any questions you have with your health care provider. Document  Released: 05/26/2005 Document Revised: 11/01/2015 Document Reviewed: 01/31/2013 Elsevier Interactive Patient Education  2017 [employer]  

## 2017-06-05 NOTE — Telephone Encounter (Signed)
Scheduled appt per 12/28 los - Gave patient AVS and calender per los. Per Otho Ket to work on Scheduling doppler.

## 2017-06-05 NOTE — Progress Notes (Addendum)
*  PRELIMINARY RESULTS* Vascular Ultrasound Bilateral lower extremity venous duplex has been completed.  Preliminary findings: No evidence of deep vein thrombosis or baker's cysts bilaterally.  Preliminary results called to [facility] @ 13:50.  Everrett Coombe 06/05/2017, 1:51 PM

## 2017-06-05 NOTE — Telephone Encounter (Signed)
Received call from [facility] stating that pt's bilateral doppler is negative & pt will be released to home.  Dr Burr Medico informed.

## 2017-06-05 NOTE — Progress Notes (Signed)
[facility]  [phone] [phone]  Clinic Follow-up Note   Patient Care Team: [name], MD as PCP - General (Internal Medicine) [name], MD as Consulting Physician (General Surgery) [name], MD as Consulting Physician (Hematology) [name], MD as Consulting Physician (Radiation Oncology)   Date of Service:  06/05/2017  CHIEF COMPLAINTS:  Follow-up for Malignant neoplasm of upper-outer quadrant of left breast in female, estrogen receptor positive   Oncology History   Cancer Staging Malignant neoplasm of upper-outer quadrant of left breast in female, estrogen receptor positive ([place]) Staging form: Breast, AJCC 8th Edition - Clinical stage from 12/29/2016: Stage IIIA (cT2, cN1, cM0, G3, ER: Positive, PR: Negative, HER2: Negative) - Signed by [name], MD on 01/05/2017       Malignant neoplasm of upper-outer quadrant of left breast in female, estrogen receptor positive ([place])   12/25/2016 Mammogram    [place] and [facility] Diagnostic Breast Tomo Bilateral 12/25/16 IMPRESSION: 1. Suspicious mass 2.3 x 1.6 x 3.0 cm  in the 130 o'clock location of the left breast 8cm from the nipple, biopsy recommended. Adjacent simple cyst is 1.9 cm. 2. Suspicious left axillary lymph node 2.2cm for which biopsy is indicated. 3. Indeterminate group of calcifications in the upper central portion of the left breast for which biopsy is recommended.       12/29/2016 Initial Biopsy    Diagnosis 12/29/16 1. Breast, left, needle core biopsy, stereotactic, upper inner quadrant - FIBROCYSTIC CHANGES INCLUDING APOCRINE METAPLASIA WITH CALCIFICATIONS - NO MALIGNANCY IDENTIFIED 2. Breast, left, needle core biopsy, ultrasound, 1:30 o'clock - INVASIVE MAMMARY CARCINOMA, G3 - SEE COMMENT 3. Lymph node, needle/core biopsy, ultrasound, left axillary - METASTIC CARCINOMA INVOLVING ONE LYMPH NODE (1/1) E-cadherin is positive supporting a ductal origin.       12/29/2016  Receptors her2    ER 15%+, weak staining  PR - HER2- Ki67 10%       12/29/2016 Initial Diagnosis    Malignant neoplasm of upper-outer quadrant of left breast in female, estrogen receptor positive ([place])      01/13/2017 Echocardiogram    ECHO 01/13/17 Study Conclusions - Left ventricle: The cavity size was normal. Systolic function was   normal. The estimated ejection fraction was in the range of 60%   to 65%. Wall motion was normal; there were no regional wall   motion abnormalities. Left ventricular diastolic function   parameters were normal. - Mitral valve: There was trivial regurgitation. - Right atrium: The atrium was mildly dilated. - Tricuspid valve: There was trivial regurgitation.       01/14/2017 Surgery    Port placement by Dr. Donne Hazel       01/15/2017 Imaging    Whole Body bone Scan 01/15/17 IMPRESSION: 1.  No significant abnormality identified.      01/15/2017 Imaging    CT CAP W Contrast 01/15/17 IMPRESSION: 1. Left lateral breast mass with pathologic left axillary adenopathy including a 2.0 cm left axillary lymph node. 2. Several hypodense lesions in the liver are likely benign lesions such as cysts. However, these are technically too small to characterize by CT. Possibilities for further workup include surveillance or hepatic protocol MRI with and without contrast. 3. There is also a tiny hypodense lesion in the spleen which is technically nonspecific but statistically highly likely to be benign. 4.  Prominent stool throughout the colon favors constipation      01/16/2017 - 06/05/2017 Chemotherapy    Neoadjuvant chemo AC every 2 weeks for 4  cycles starting on 01/16/2017 and ended 02/27/17. Then proceed with weekly taxol for 12 treatments starting 03/13/17. Added Carboplatin AUC 2 on 04/03/17 to weekly taxol. Due to symptomatic thrombocytopenia we held Carbo since cycle 8. Cycle 9 was post-poned due to neutropenia and Granix was added starting 05/12/17. Added carbo  back at reduced dose AUC 1.5 with cycle 10 on 05/22/17. 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   Analysis also detected a Variant of Uncertain Significance (VUS) in the NBN gene called c.628G>T (p.Val210Phe)        HISTORY OF PRESENTING ILLNESS: 01/07/17 Kathryn Stanley 41 y.o. female is here because of newly diagnosed Malignant neoplasm of upper-outer quadrant of left breast in female, estrogen receptor positive. She presents to the breast clinic today with her common law husband. She felt the lump herself 3 months ago. She felt pain like "stabbing" first. She then had a mammogram 12/25/16 and subsequent biopsy   In the past she was using smoking match and quit smoking "cold [place]" that was 1 month ago.   Today she reports her breast pain still comes and goes. She has not noticed any new changes. 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 Lately has regular period. She has a weak cervix and her children were born premature. She is currently working at [employer] and works on her feet all the time, working 5 days a week.   GYN HISTORY  Menarchal: 10 LMP: 12/10/16 Contraceptive: No HRT: NA GP: G2P1 - twins    CURRENT THERAPY:   -Neoadjuvant chemo AC every 2 weeks for 4 cycles starting on 01/16/2017 and ended 02/27/17.  -Then proceed with weekly taxol for 12 treatments starting 03/13/17, added Carboplatin AUC 2 on 04/03/17 to weekly taxol. Due to symptomatic thrombocytopenia we held Carbo since cycle 8. Cycle 9 was post-poned due to neutropenia and Granix was added starting 05/12/17. Added carbo back at reduced dose AUC 1.5 with cycle 10 on 05/22/17. Plan to complete weekly CT on 06/05/17    INTERVAL HISTORY:  Kathryn Stanley is here for a follow-up and last cycle chemo. She presents  to the clinic with her husband.  She notes the swelling in her ankles does not go away after elevation. It seem "puffy" all the time. It does not hurt when she squeezes her ankles or calves. She works on her feet all day so at the end of the day her feet will hurt. She will get pain in her feet and her calf occasionally.      MEDICAL HISTORY:  Past Medical History:  Diagnosis Date  . Cancer [facility])    left breast cancer  . Dermatologic problem    possible psoriasis. Pt has not had been diagnosed by dermatology.  . 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 GERD (gastroesophageal reflux disease)   . Tobacco dependence    trying to quit with nicotene patches    SURGICAL HISTORY: Past Surgical History:  Procedure Laterality Date  . CESAREAN SECTION    . PORTACATH PLACEMENT N/A 01/14/2017   Procedure: INSERTION PORT-A-CATH WITH [place];  Surgeon: [name], MD;  Location: [place];  Service: General;  Laterality: N/A;  SOCIAL HISTORY: Social History   Socioeconomic History  . Marital status: Married    Spouse name: Not on file  . Number of children: Not on file  . Years of education: Not on file  . Highest education level: Not on file  Social Needs  . Financial resource strain: Not on file  . Food insecurity - worry: Not on file  . Food insecurity - inability: Not on file  . Transportation needs - medical: Not on file  . Transportation needs - non-medical: Not on file  Occupational History  . Not on file  Tobacco Use  . Smoking status: Former Smoker    Packs/day: 1.00    Years: 17.00    Pack years: 17.00    Types: Cigarettes    Last attempt to quit: 12/15/2016    Years since quitting: 0.4  . Smokeless tobacco: Never Used  Substance and Sexual Activity  . Alcohol use: No  . Drug use: No  . Sexual activity: Yes    Birth control/protection: None  Other Topics Concern  . Not on file  Social History  Narrative  . Not on file    FAMILY HISTORY: Family History  Problem Relation Age of Onset  . Stroke Maternal Grandmother   . Stroke Father   . Hypertension Father   . Other Father        Adopted    ALLERGIES:  is allergic to chlorhexidine gluconate; amoxicillin; and nicotine.  MEDICATIONS:  Current Outpatient Medications  Medication Sig Dispense Refill  . famotidine (PEPCID) 10 MG tablet Take 10 mg by mouth daily as needed for heartburn or indigestion.    Marland Kitchen ibuprofen (ADVIL,MOTRIN) 200 MG tablet Take 200-400 mg by mouth daily as needed for headache or moderate pain.    Marland Kitchen lidocaine-prilocaine (EMLA) cream Apply to affected area once 30 g 3  . furosemide (LASIX) 20 MG tablet Take 1 tablet (20 mg total) by mouth daily. Take for 3 days (Patient not taking: Reported on 05/22/2017) 10 tablet 0  . ondansetron (ZOFRAN) 8 MG tablet Take 1 tablet (8 mg total) by mouth 2 (two) times daily as needed. Start on the third day after chemotherapy. (Patient not taking: Reported on 06/05/2017) 30 tablet 1  . potassium chloride SA (K-DUR,KLOR-CON) 20 MEQ tablet Take 1 tablet (20 mEq total) by mouth daily. Take once daily with lasix (Patient not taking: Reported on 05/22/2017) 10 tablet 0  . prochlorperazine (COMPAZINE) 10 MG tablet TAKE 1 TABLET EVERY 6 HOURS AS NEEDED FOR NAUSEA OR VOMITING (Patient not taking: Reported on 06/05/2017) 30 tablet 2  . traMADol (ULTRAM) 50 MG tablet Take 1 tablet (50 mg total) by mouth every 6 (six) hours as needed. (Patient not taking: Reported on 05/22/2017) 12 tablet 1   No current facility-administered medications for this visit.    REVIEW OF SYSTEMS:   Constitutional: Denies abnormal night sweats.  (+) complete hair loss  Eyes: Denies blurriness of vision, double vision or watery eyes Ears, nose, mouth, throat, and face: Denies mucositis or sore throat Respiratory: Denies dyspnea or wheezes   Cardiovascular: Denies palpitation, chest discomfort (+) persistent lower  extremity swelling Gastrointestinal:  Denies nausea, heartburn.  Skin: Denies abnormal skin rashes Lymphatics: Denies new lymphadenopathy or easy bruising Neurological:Denies numbness, tingling or new weaknesses MSK: negative Breast: (+) occasional shooting pain in left breast, palpable mass in left breast, now smaller and softer Behavioral/Psych: Mood is stable, no new changes  All other systems were reviewed with the patient and  are negative.  PHYSICAL EXAMINATION:  ECOG PERFORMANCE STATUS: 1  Vitals:   06/05/17 0917  BP: 120/77  Pulse: 97  Resp: 18  Temp: 98 F (36.7 C)  SpO2: 100%   Filed Weights   06/05/17 0917  Weight: 201 lb (91.2 kg)     GENERAL:alert, no distress and comfortable SKIN: skin color, texture, turgor are normal, no rashes or significant lesions EYES: normal, conjunctiva are pink and non-injected, sclera clear OROPHARYNX:no exudate, no erythema and lips, buccal mucosa, and tongue normal  NECK: supple, thyroid normal size, non-tender, without nodularity LYMPH:  no palpable lymphadenopathy in the cervical, axillary or inguinal LUNGS: clear to auscultation and percussion with normal breathing effort HEART: regular rate & rhythm and no murmurs and no lower extremity edema ABDOMEN:abdomen soft, non-tender and normal bowel sounds Musculoskeletal:no cyanosis of digits and no clubbing  PSYCH: alert & oriented x 3 with fluent speech NEURO: no focal motor/sensory deficits Breast: (+) previous 1 cm,  now 0.5 cm mass in UOQ of left breast is softer than before. (+) no longer palpable lymph node in left axillary that was previously about 1 cm.  LABORATORY DATA:  I have reviewed the data as listed CBC Latest Ref Rng & Units 06/05/2017 06/01/2017 05/29/2017  WBC 3.9 - 10.3 10e3/uL 4.1 1.7(L) 2.8(L)  Hemoglobin 11.6 - 15.9 g/dL 11.4(L) 11.5(L) 10.8(L)  Hematocrit 34.8 - 46.6 % 33.0(L) 33.8(L) 31.4(L)  Platelets 145 - 400 10e3/uL 255 247 300    CMP Latest Ref Rng  & Units 06/05/2017 06/01/2017 05/29/2017  Glucose 70 - 140 mg/dl 129 106 136  BUN 7.0 - 26.0 mg/dL 25.7 19.9 15.8  Creatinine 0.6 - 1.1 mg/dL 0.8 0.8 0.9  Sodium 136 - 145 mEq/L 140 139 140  Potassium 3.5 - 5.1 mEq/L 3.7 3.7 3.5  Chloride 101 - 111 mmol/L - - -  CO2 22 - 29 mEq/L _0 Calcium 8.4 - 10.4 mg/dL 9.0 8.8 8.6  Total Protein 6.4 - 8.3 g/dL 6.7 6.4 6.6  Total Bilirubin 0.20 - 1.20 mg/dL 0.22 0.27 0.23  Alkaline Phos 40 - 150 U/L 72 60 70  AST 5 - 34 U/L _1 ALT 0 - 55 U/L 46 34 41     PATHOLOGY  Diagnosis 12/29/16 1. Breast, left, needle core biopsy, stereotactic, upper inner quadrant - FIBROCYSTIC CHANGES INCLUDING APOCRINE METAPLASIA WITH CALCIFICATIONS - NO MALIGNANCY IDENTIFIED 2. Breast, left, needle core biopsy, ultrasound, 1:30 o'clock - INVASIVE MAMMARY CARCINOMA - SEE COMMENT 3. Lymph node, needle/core biopsy, ultrasound, left axillary - METASTIC CARCINOMA INVOLVING ONE LYMPH NODE (1/1) Microscopic Comment 2. The biopsy material shows an infiltrative proliferation of cells with large vesicular nuclei with conspicuous nucleoli, arranged linearly and in small clusters. There are scattered cells with marked pleomorphism and cytologic atypia. Based on the biopsy, the carcinoma appears Nottingham grade 3 of 3 and measures 0.9 cm in greatest linear extent. E-cadherin and prognostic markers (ER/PR/ki-67/HER2-FISH)are pending and will be reported in an addendum. Dr. Tresa Moore reviewed the case and agrees with the above diagnosis. This case was called to [name] on December 30, 2016. Results: IMMUNOHISTOCHEMICAL AND MORPHOMETRIC ANALYSIS PERFORMED MANUALLY Estrogen Receptor: 15%, POSITIVE, WEAK STAINING INTENSITY Progesterone Receptor: 0%, NEGATIVE Proliferation Marker Ki67: 10% COMMENT: The negative hormone receptor study(ies) in this case has An internal positive control. Results: HER2 - NEGATIVE RATIO OF HER2/CEP17 SIGNALS  1.63 AVERAGE HER2 COPY NUMBER PER CELL 3.10   PROCEDURES  ECHO 01/13/17 Study Conclusions -  Left ventricle: The cavity size was normal. Systolic function was   normal. The estimated ejection fraction was in the range of 60%   to 65%. Wall motion was normal; there were no regional wall   motion abnormalities. Left ventricular diastolic function   parameters were normal. - Mitral valve: There was trivial regurgitation. - Right atrium: The atrium was mildly dilated. - Tricuspid valve: There was trivial regurgitation.  RADIOGRAPHIC STUDIES: I have personally reviewed the radiological images as listed and agreed with the findings in the report. No results found. ASSESSMENT & PLAN:  NETHA DAFOE is a 41 y.o. premenopausal female with no significant past medical history, presented with a palpable left breast mass.  1. Malignant neoplasm of upper-outer quadrant of left breast in female, ductal carcinoma, cT2N1M0, Stage IIIa, ER weakly +, PR - , HER2 - , Grade 3 -We reviewed her mammogram and initial biopsy results in details with patient and her husband.  -Her tumor is weakly ER positive, PR negative, HER-2 negative, grade 3, she has positive lymph nodes, those are all high risks for recurrence. I recommend neoadjuvant or adjuvant chemotherapy to reduce her risk of recurrence after surgery. -We discussed that the benefit of neoadjuvant chemotherapy, to shrink the tumor and lymph nodes, and potentially she would be a candidate for lumpectomy and may not need lymph node dissection if she has good response. I have discussed this with her surgeon Dr. Donne Hazel today, both of [place] recommend her to consider neoadjuvant chemotherapy. She agrees.  -we reviewed her staging scan CT from 01/15/17 in person. A few hypodense lesion were seen in the liver, appear to be benign cysts. CT and bone scan otherwise negative for distant metastasis. 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 This was  discussed with patient. -I recommended neoadjuvant chemotherapy with dose dense Adriamycin and Cytoxan (AC) every 2 weeks for 4 cycles, followed by weekly Taxol for 12 weeks. She agreed. -She completed AC treatment 01/16/17-02/27/17. She tolerated well other than bone pain and constipation. She has had good clinical response to treatment as her tumor is slightly smaller and her lymph node is no longer palpable upon 03/13/17 breast exam.  -She started her weekly taxol 03/13/17, tolerating well so far -We added Carboplatin with cycle 4 on 04/03/17. She tolerated well. Due to symptomatic thrombocytopenia we held Carbo since cycle 8. Cycle 9 was post poned due to neutropenia and Granix was added starting 05/12/17. We added carbo at reduced dose AUC 1.5 to her taxol with cycle 10. She will complete her CT treatment on 12/28/8 -Labs reviewed, Transmantitis has resolved, mild anemia at 11.4. Labs adequate to complete last CT today. -She plans to have her Breast MRI on 06/11/16. I will call her with results. If normal she is fine to have port removed during surgery. She will proceed with her bilateral mastectomy on 07/16/26. -I explained if she still has significant residual disease after her surgery I would recommend adjuvant Xeloda.  -I discussed after surgery she will have adjuvant radiation then anti-estrogen therapy Tamoxifen. There is not much benefit to put her on Tamoxifen before surgery as she is weekly ER positive and the risk of thrombosis from tamoxifen and surgery.   -F/u in 2-3 weeks    2. Genetics: BRCA1 + -Due to her young age of diagnosis I suggested she get genetic testing. 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 Analysis also  detected a Variant of Uncertain Significance (VUS) in the NBN gene called c.628G>T (p.Val210Phe).  -She is agreeable with bilateral oophorectomy and mastectomy, she  has seen her breast surgeon Dr. Donne Hazel I again and reviewed bilateral mastectomy and reconstruction.  She has been seen by plastic surgeon Dr. Ashley Mariner -I explained when her children get older they need to get tested and start cancer screenings earlier.  -I will refer her to GYN oncologist Dr. Denman George for BSO  3. Financial Support -They are not insured and are attempting to get an orange card.  -They have met our financial support/social worker  4. Mild intermittent dizziness -Possible related to anemia and mild dehydration, she is tolerating oral intake very well. -Encouraged her to add some salt in her diet  5. Epistaxis  -She developed passing large clots when blowing nose, secondary to thrombocytopenia from chemotherapy. Carboplatin has been held since cycle 8 on 05/01/2017.  Platelets 54k 05/08/17, she does not require platelet transfusion. -Discussed bleeding precautions, she will avoid NSAIDs and blowing her nose  6. Lower Extremity Swelling  -This is likely secondary to chemotherapy, I do not suspect blood clot. She does elevate legs and this helps; she can wear compression stockings. She did try lasix with some improvement  -Swelling has not reduced with elevation and continues to persist.  -Will get a doppler of LE today   7. Transmantitis  -05/22/17 show increase in AST at 44 and ALT at 102, likely secondary to chemotherapy -Will monitor.  -resolved as of 12/28/8 labs    PLAN:  -Labs reviewed and adequate for last cycle CT today  -Lab, flush and f/u in 2-3 weeks  -she is going to have lateral mastectomy and reconstruction in early February -Granix Injection on 12/31  -Doppler today  -MRI on 06/11/16   All questions were answered. The patient knows to call the clinic with any problems, questions or concerns. I spent 30 minutes counseling the patient face to face. The total time spent in the appointment was 40 minutes and more than 50% was on counseling.  This document  serves as a record of services personally performed by [name], MD. It was created on her behalf by Joslyn Devon, a trained medical scribe. The creation of this record is based on the scribe's personal observations and the provider's statements to them.    I have reviewed the above documentation for accuracy and completeness, and I agree with the above.     [name], MD 06/05/2017   ADDENDUM  Her Doppler of lower extremities were negative for DVT. Pt is aware of the result.   [name]  06/05/2017

## 2017-06-05 NOTE — Telephone Encounter (Signed)
Scheduled new patient appt, gave patient a schedule

## 2017-06-05 NOTE — Patient Instructions (Signed)
[place] Discharge Instructions for Patients Receiving Chemotherapy  Today you received the following chemotherapy agents: Carboplatin and Taxol  To help prevent nausea and vomiting after your treatment, we encourage you to take your nausea medication as directed   If you develop nausea and vomiting that is not controlled by your nausea medication, call the clinic.   BELOW ARE SYMPTOMS THAT SHOULD BE REPORTED IMMEDIATELY:  *FEVER GREATER THAN 100.5 F  *CHILLS WITH OR WITHOUT FEVER  NAUSEA AND VOMITING THAT IS NOT CONTROLLED WITH YOUR NAUSEA MEDICATION  *UNUSUAL SHORTNESS OF BREATH  *UNUSUAL BRUISING OR BLEEDING  TENDERNESS IN MOUTH AND THROAT WITH OR WITHOUT PRESENCE OF ULCERS  *URINARY PROBLEMS  *BOWEL PROBLEMS  UNUSUAL RASH Items with * indicate a potential emergency and should be followed up as soon as possible.  Feel free to call the clinic should you have any questions or concerns. The clinic phone number is [phone].  Please show the [place] at check-in to the Emergency Department and triage nurse.

## 2017-06-08 ENCOUNTER — Ambulatory Visit (HOSPITAL_BASED_OUTPATIENT_CLINIC_OR_DEPARTMENT_OTHER): Payer: Medicaid Other

## 2017-06-08 ENCOUNTER — Other Ambulatory Visit: Payer: Medicaid Other

## 2017-06-08 ENCOUNTER — Ambulatory Visit: Payer: Medicaid Other

## 2017-06-08 ENCOUNTER — Ambulatory Visit: Payer: Medicaid Other | Admitting: Hematology

## 2017-06-08 VITALS — BP 126/90 | HR 95 | Temp 97.8°F | Resp 20

## 2017-06-08 DIAGNOSIS — Z170 Estrogen receptor positive status [ER+]: Secondary | ICD-10-CM

## 2017-06-08 DIAGNOSIS — Z95828 Presence of other vascular implants and grafts: Secondary | ICD-10-CM

## 2017-06-08 DIAGNOSIS — Z5189 Encounter for other specified aftercare: Secondary | ICD-10-CM

## 2017-06-08 DIAGNOSIS — C50412 Malignant neoplasm of upper-outer quadrant of left female breast: Secondary | ICD-10-CM | POA: Diagnosis present

## 2017-06-08 MED ORDER — TBO-FILGRASTIM 480 MCG/0.8ML ~~LOC~~ SOSY
480.0000 ug | PREFILLED_SYRINGE | Freq: Once | SUBCUTANEOUS | Status: AC
  Administered 2017-06-08: 480 ug via SUBCUTANEOUS

## 2017-06-08 MED ORDER — TBO-FILGRASTIM 480 MCG/0.8ML ~~LOC~~ SOSY
PREFILLED_SYRINGE | SUBCUTANEOUS | Status: AC
  Filled 2017-06-08: qty 0.8

## 2017-06-08 NOTE — Patient Instructions (Signed)
Tbo-Filgrastim injection What is this medicine? TBO-FILGRASTIM (T B O fil GRA stim) is a granulocyte colony-stimulating factor that stimulates the growth of neutrophils, a type of white blood cell important in the body's fight against infection. It is used to reduce the incidence of fever and infection in patients with certain types of cancer who are receiving chemotherapy that affects the bone marrow. This medicine may be used for other purposes; ask your health care provider or pharmacist if you have questions. COMMON BRAND NAME(S): Granix What should I tell my health care provider before I take this medicine? They need to know if you have any of these conditions: -bone scan or tests planned -kidney disease -sickle cell anemia -an unusual or allergic reaction to tbo-filgrastim, filgrastim, pegfilgrastim, other medicines, foods, dyes, or preservatives -pregnant or trying to get pregnant -breast-feeding How should I use this medicine? This medicine is for injection under the skin. If you get this medicine at home, you will be taught how to prepare and give this medicine. Refer to the Instructions for Use that come with your medication packaging. Use exactly as directed. Take your medicine at regular intervals. Do not take your medicine more often than directed. It is important that you put your used needles and syringes in a special sharps container. Do not put them in a trash can. If you do not have a sharps container, call your pharmacist or healthcare provider to get one. Talk to your pediatrician regarding the use of this medicine in children. Special care may be needed. Overdosage: If you think you have taken too much of this medicine contact a poison control center or emergency room at once. NOTE: This medicine is only for you. Do not share this medicine with others. What if I miss a dose? It is important not to miss your dose. Call your doctor or health care professional if you miss a  dose. What may interact with this medicine? This medicine may interact with the following medications: -medicines that may cause a release of neutrophils, such as lithium This list may not describe all possible interactions. Give your health care provider a list of all the medicines, herbs, non-prescription drugs, or dietary supplements you use. Also tell them if you smoke, drink alcohol, or use illegal drugs. Some items may interact with your medicine. What should I watch for while using this medicine? You may need blood work done while you are taking this medicine. What side effects may I notice from receiving this medicine? Side effects that you should report to your doctor or health care professional as soon as possible: -allergic reactions like skin rash, itching or hives, swelling of the face, lips, or tongue -blood in the urine -dark urine -dizziness -fast heartbeat -feeling faint -shortness of breath or breathing problems -signs and symptoms of infection like fever or chills; cough; or sore throat -signs and symptoms of kidney injury like trouble passing urine or change in the amount of urine -stomach or side pain, or pain at the shoulder -sweating -swelling of the legs, ankles, or abdomen -tiredness Side effects that usually do not require medical attention (report to your doctor or health care professional if they continue or are bothersome): -bone pain -headache -muscle pain -vomiting This list may not describe all possible side effects. Call your doctor for medical advice about side effects. You may report side effects to FDA at 1-800-FDA-1088. Where should I keep my medicine? Keep out of the reach of children. Store in a refrigerator between   2 and 8 degrees C (36 and 46 degrees F). Keep in carton to protect from light. Throw away this medicine if it is left out of the refrigerator for more than 5 consecutive days. Throw away any unused medicine after the expiration  date. NOTE: This sheet is a summary. It may not cover all possible information. If you have questions about this medicine, talk to your doctor, pharmacist, or health care provider.  2018 Elsevier/Gold Standard (2015-07-16 19:07:04)  

## 2017-06-10 ENCOUNTER — Other Ambulatory Visit: Payer: Self-pay | Admitting: General Surgery

## 2017-06-10 DIAGNOSIS — Z170 Estrogen receptor positive status [ER+]: Principal | ICD-10-CM

## 2017-06-10 DIAGNOSIS — C50012 Malignant neoplasm of nipple and areola, left female breast: Secondary | ICD-10-CM

## 2017-06-11 ENCOUNTER — Other Ambulatory Visit: Payer: Medicaid Other

## 2017-06-11 ENCOUNTER — Other Ambulatory Visit: Payer: Self-pay | Admitting: General Surgery

## 2017-06-12 ENCOUNTER — Telehealth: Payer: Self-pay | Admitting: Hematology

## 2017-06-12 ENCOUNTER — Ambulatory Visit
Admission: RE | Admit: 2017-06-12 | Discharge: 2017-06-12 | Disposition: A | Discharge status: Home / Self Care | Payer: Medicaid Other | Source: Ambulatory Visit | Attending: Hematology | Admitting: Hematology

## 2017-06-12 DIAGNOSIS — C50412 Malignant neoplasm of upper-outer quadrant of left female breast: Secondary | ICD-10-CM

## 2017-06-12 DIAGNOSIS — Z170 Estrogen receptor positive status [ER+]: Principal | ICD-10-CM

## 2017-06-12 MED ORDER — GADOBENATE DIMEGLUMINE 529 MG/ML IV SOLN
19.0000 mL | Freq: Once | INTRAVENOUS | Status: AC | PRN
  Administered 2017-06-12: 19 mL via INTRAVENOUS

## 2017-06-12 NOTE — Telephone Encounter (Signed)
I called patient, and reviewed her breast MRI scan findings from today.  She has had very good partial response to neoadjuvant chemotherapy both the primary tumor and, lymph node has decreased in size.  No other new lesions.  Patient was very happy about the news.  She is scheduled to see Dr. Donne Hazel this month, and have surgery on July 16, 2017.  [name]  06/12/2017

## 2017-06-18 ENCOUNTER — Telehealth: Payer: Self-pay | Admitting: *Deleted

## 2017-06-18 NOTE — Telephone Encounter (Signed)
Husband stated he would call in the morning if they felt she needed to be seen. She is already set up for an appt on 06/23/17 with Dr. Burr Medico.

## 2017-06-18 NOTE — Telephone Encounter (Signed)
TCT patient in response to call received from her husband, [name].  Spoke with [name]. Patient is with him during this phone call per [name]. He states that pt c/o nosebleeds last 2-3 days-noted bloody tissues after blowing her nose on several occasions. Blood is not dripping out her nose per her husband. Advised husband that if nosebleed persists and is bleeding profusely, to go to the ED. Advised also that he could pressure on the bridge of her nose/ice on the back of her neck.  He also states that pt had 1 epsiode of vomiting yesterday-not preceded by nausea. He states she felt better after.  This was yesterday. No further episodes.  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 Last Taxol/carbo was on 06/05/17. Advised him that it takes more time to have that issue resolve. If it continues to be a problem for her, advised him that Dr. Burr Medico could prescribe medication for the neuropathy. Offered the option of the [facility] tomorrow if she needs to be seen by a provider here @ [facility].  We could check her labs and see Sandi Mealy, PA. Husband voiced understanding of the above. He stated

## 2017-06-22 NOTE — Progress Notes (Signed)
[place]  Telephone:(336) [phone] Fax:([phone]) [phone]  Clinic Follow-up Note   Patient Care Team: [name], MD as PCP - General (Internal Medicine) [name], MD as Consulting Physician (General Surgery) [name], MD as Consulting Physician (Hematology) [name], MD as Consulting Physician (Radiation Oncology)   Date of Service:  06/23/2017  CHIEF COMPLAINTS:  Follow-up for Malignant neoplasm of upper-outer quadrant of left breast in female, estrogen receptor positive   Oncology History   Cancer Staging Malignant neoplasm of upper-outer quadrant of left breast in female, estrogen receptor positive ([place]) Staging form: Breast, AJCC 8th Edition - Clinical stage from 12/29/2016: Stage IIIA (cT2, cN1, cM0, G3, ER: Positive, PR: Negative, HER2: Negative) - Signed by [name], MD on 01/05/2017       Malignant neoplasm of upper-outer quadrant of left breast in female, estrogen receptor positive ([place])   12/25/2016 Mammogram    [place] and [facility] Diagnostic Breast Tomo Bilateral 12/25/16 IMPRESSION: 1. Suspicious mass 2.3 x 1.6 x 3.0 cm  in the 130 o'clock location of the left breast 8cm from the nipple, biopsy recommended. Adjacent simple cyst is 1.9 cm. 2. Suspicious left axillary lymph node 2.2cm for which biopsy is indicated. 3. Indeterminate group of calcifications in the upper central portion of the left breast for which biopsy is recommended.       12/29/2016 Initial Biopsy    Diagnosis 12/29/16 1. Breast, left, needle core biopsy, stereotactic, upper inner quadrant - FIBROCYSTIC CHANGES INCLUDING APOCRINE METAPLASIA WITH CALCIFICATIONS - NO MALIGNANCY IDENTIFIED 2. Breast, left, needle core biopsy, ultrasound, 1:30 o'clock - INVASIVE MAMMARY CARCINOMA, G3 - SEE COMMENT 3. Lymph node, needle/core biopsy, ultrasound, left axillary - METASTIC CARCINOMA INVOLVING ONE LYMPH NODE (1/1) E-cadherin is positive supporting a ductal origin.       12/29/2016  Receptors her2    ER 15%+, weak staining  PR - HER2- Ki67 10%       12/29/2016 Initial Diagnosis    Malignant neoplasm of upper-outer quadrant of left breast in female, estrogen receptor positive ([place])      01/13/2017 Echocardiogram    ECHO 01/13/17 Study Conclusions - Left ventricle: The cavity size was normal. Systolic function was   normal. The estimated ejection fraction was in the range of 60%   to 65%. Wall motion was normal; there were no regional wall   motion abnormalities. Left ventricular diastolic function   parameters were normal. - Mitral valve: There was trivial regurgitation. - Right atrium: The atrium was mildly dilated. - Tricuspid valve: There was trivial regurgitation.       01/14/2017 Surgery    Port placement by Dr. Donne Hazel       01/15/2017 Imaging    Whole Body bone Scan 01/15/17 IMPRESSION: 1.  No significant abnormality identified.      01/15/2017 Imaging    CT CAP W Contrast 01/15/17 IMPRESSION: 1. Left lateral breast mass with pathologic left axillary adenopathy including a 2.0 cm left axillary lymph node. 2. Several hypodense lesions in the liver are likely benign lesions such as cysts. However, these are technically too small to characterize by CT. Possibilities for further workup include surveillance or hepatic protocol MRI with and without contrast. 3. There is also a tiny hypodense lesion in the spleen which is technically nonspecific but statistically highly likely to be benign. 4.  Prominent stool throughout the colon favors constipation      01/16/2017 - 06/05/2017 Chemotherapy    Neoadjuvant chemo AC every 2 weeks for 4  cycles starting on 01/16/2017 and ended 02/27/17. Then proceed with weekly taxol for 12 treatments starting 03/13/17. Added Carboplatin AUC 2 on 04/03/17 to weekly taxol. Due to symptomatic thrombocytopenia we held Carbo since cycle 8. Cycle 9 was post-poned due to neutropenia and Granix was added starting 05/12/17. Added carbo  back at reduced dose AUC 1.5 with cycle 10 on 05/22/17. 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   Analysis also detected a Variant of Uncertain Significance (VUS) in the NBN gene called c.628G>T (p.Val210Phe)      06/12/2017 Imaging    MR BREAST BILATERAL WO CONTRAST IMPRESSION: 1. Significant positive response to neoadjuvant chemotherapy. The index carcinoma in the left breast has significantly decreased in size. Left axillary adenopathy has also significantly improved. 2. No other evidence of malignancy.        HISTORY OF PRESENTING ILLNESS: 01/07/17 Kathryn Stanley 42 y.o. female is here because of newly diagnosed Malignant neoplasm of upper-outer quadrant of left breast in female, estrogen receptor positive. She presents to the breast clinic today with her common law husband. She felt the lump herself 3 months ago. She felt pain like "stabbing" first. She then had a mammogram 12/25/16 and subsequent biopsy   In the past she was using smoking match and quit smoking "cold [place]" that was 1 month ago.   Today she reports her breast pain still comes and goes. She has not noticed any new changes. 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 Lately has regular period. She has a weak cervix and her children were born premature. She is currently working at [employer] and works on her feet all the time, working 5 days a week.   GYN HISTORY  Menarchal: 10 LMP: 12/10/16 Contraceptive: No HRT: NA GP: G2P1 - twins    CURRENT THERAPY:  Pending surgery    INTERVAL HISTORY:  Kathryn Stanley is here for a follow-up. She presents to the clinic with her husband. She notes she is doing well overall. She reports a good appetite and a higher energy level since finishing chemo on 06/08/17. She  notes to have neuropathy in her hands and feet and mild ankle swelling that resolves with movement. Of note since last visit, she had a Breast MRI on 06/12/17 with results revealing significant positive response to neoadjuvant chemotherapy. Pt has mastectomy scheduled for 07/16/17 and a pre-op appointment in 2 days. She will have an appointment on 07/31/17 with Dr. Denman George to discuss SBO.  On review of systems, pt reports no other complaints at this time. Pertinent positives are listed and detailed within the above HPI.   MEDICAL HISTORY:  Past Medical History:  Diagnosis Date  . Cancer [facility])    left breast cancer  . Dermatologic problem    possible psoriasis. Pt has not had been diagnosed by dermatology.  . 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 GERD (gastroesophageal reflux disease)   . Tobacco dependence    trying to quit with nicotene patches    SURGICAL HISTORY: Past Surgical History:  Procedure Laterality Date  . CESAREAN SECTION    . PORTACATH PLACEMENT N/A 01/14/2017   Procedure: INSERTION PORT-A-CATH WITH [place];  Surgeon: [name],  MD;  Location: [facility];  Service: General;  Laterality: N/A;    SOCIAL HISTORY: Social History   Socioeconomic History  . Marital status: Married    Spouse name: Not on file  . Number of children: Not on file  . Years of education: Not on file  . Highest education level: Not on file  Social Needs  . Financial resource strain: Not on file  . Food insecurity - worry: Not on file  . Food insecurity - inability: Not on file  . Transportation needs - medical: Not on file  . Transportation needs - non-medical: Not on file  Occupational History  . Not on file  Tobacco Use  . Smoking status: Former Smoker    Packs/day: 1.00    Years: 17.00    Pack years: 17.00    Types: Cigarettes    Last attempt to quit: 12/15/2016    Years since quitting: 0.5  . Smokeless tobacco:  Never Used  Substance and Sexual Activity  . Alcohol use: No  . Drug use: No  . Sexual activity: Yes    Birth control/protection: None  Other Topics Concern  . Not on file  Social History Narrative  . Not on file    FAMILY HISTORY: Family History  Problem Relation Age of Onset  . Stroke Maternal Grandmother   . Stroke Father   . Hypertension Father   . Other Father        Adopted    ALLERGIES:  is allergic to chlorhexidine gluconate; amoxicillin; and nicotine.  MEDICATIONS:  Current Outpatient Medications  Medication Sig Dispense Refill  . famotidine (PEPCID) 10 MG tablet Take 10 mg by mouth daily as needed for heartburn or indigestion.    Marland Kitchen ibuprofen (ADVIL,MOTRIN) 200 MG tablet Take 200-400 mg by mouth daily as needed for headache or moderate pain.    Marland Kitchen lidocaine-prilocaine (EMLA) cream Apply to affected area once 30 g 3  . furosemide (LASIX) 20 MG tablet Take 1 tablet (20 mg total) by mouth daily. Take for 3 days (Patient not taking: Reported on 05/22/2017) 10 tablet 0  . ondansetron (ZOFRAN) 8 MG tablet Take 1 tablet (8 mg total) by mouth 2 (two) times daily as needed. Start on the third day after chemotherapy. (Patient not taking: Reported on 06/05/2017) 30 tablet 1  . potassium chloride SA (K-DUR,KLOR-CON) 20 MEQ tablet Take 1 tablet (20 mEq total) by mouth daily. Take once daily with lasix (Patient not taking: Reported on 05/22/2017) 10 tablet 0  . prochlorperazine (COMPAZINE) 10 MG tablet TAKE 1 TABLET EVERY 6 HOURS AS NEEDED FOR NAUSEA OR VOMITING (Patient not taking: Reported on 06/05/2017) 30 tablet 2  . traMADol (ULTRAM) 50 MG tablet Take 1 tablet (50 mg total) by mouth every 6 (six) hours as needed. (Patient not taking: Reported on 05/22/2017) 12 tablet 1   No current facility-administered medications for this visit.    REVIEW OF SYSTEMS:   Constitutional: Denies abnormal night sweats.  (+) complete hair loss  Eyes: Denies blurriness of vision, double vision or  watery eyes Ears, nose, mouth, throat, and face: Denies mucositis or sore throat Respiratory: Denies dyspnea or wheezes   Cardiovascular: Denies palpitation, chest discomfort (+) persistent lower extremity swelling Gastrointestinal:  Denies nausea, heartburn.  Skin: Denies abnormal skin rashes Lymphatics: Denies new lymphadenopathy or easy bruising Neurological:Denies numbness, tingling or new weaknesses MSK: negative Breast: (+) occasional shooting pain in left breast, palpable mass in left breast, now smaller and softer Behavioral/Psych: Mood is  stable, no new changes  All other systems were reviewed with the patient and are negative.  PHYSICAL EXAMINATION:  ECOG PERFORMANCE STATUS: 1  Vitals:   06/23/17 1407  BP: 121/85  Pulse: 87  Resp: 18  Temp: 97.7 F (36.5 C)  SpO2: 100%   Filed Weights   06/23/17 1407  Weight: 202 lb 4.8 oz (91.8 kg)     GENERAL:alert, no distress and comfortable SKIN: skin color, texture, turgor are normal, no rashes or significant lesions EYES: normal, conjunctiva are pink and non-injected, sclera clear OROPHARYNX:no exudate, no erythema and lips, buccal mucosa, and tongue normal  NECK: supple, thyroid normal size, non-tender, without nodularity LYMPH:  no palpable lymphadenopathy in the cervical, axillary or inguinal LUNGS: clear to auscultation and percussion with normal breathing effort HEART: regular rate & rhythm and no murmurs and no lower extremity edema ABDOMEN:abdomen soft, non-tender and normal bowel sounds Musculoskeletal:no cyanosis of digits and no clubbing  PSYCH: alert & oriented x 3 with fluent speech NEURO: no focal motor/sensory deficits Breast: (+) previous 1 cm,  now 0.5 cm mass in UOQ of left breast is softer than before. (+) no longer palpable lymph node in left axillary that was previously about 1 cm.  LABORATORY DATA:  I have reviewed the data as listed CBC Latest Ref Rng & Units 06/23/2017 06/05/2017 06/01/2017  WBC  3.9 - 10.3 K/uL 3.9 4.1 1.7(L)  Hemoglobin 11.6 - 15.9 g/dL 12.1 11.4(L) 11.5(L)  Hematocrit 34.8 - 46.6 % 35.4 33.0(L) 33.8(L)  Platelets 145 - 400 K/uL 207 255 247    CMP Latest Ref Rng & Units 06/23/2017 06/05/2017 06/01/2017  Glucose 70 - 140 mg/dL 93 129 106  BUN 7 - 26 mg/dL 18 25.7 19.9  Creatinine 0.60 - 1.10 mg/dL 0.83 0.8 0.8  Sodium 136 - 145 mmol/L 139 140 139  Potassium 3.3 - 4.7 mmol/L 3.7 3.7 3.7  Chloride 98 - 109 mmol/L 107 - -  CO2 22 - 29 mmol/L _0 Calcium 8.4 - 10.4 mg/dL 9.1 9.0 8.8  Total Protein 6.4 - 8.3 g/dL 6.6 6.7 6.4  Total Bilirubin 0.2 - 1.2 mg/dL 0.4 0.22 0.27  Alkaline Phos 40 - 150 U/L 71 72 60  AST 5 - 34 U/L 54(H) 23 15  ALT 0 - 55 U/L 90(H) 46 34     PATHOLOGY  Diagnosis 12/29/16 1. Breast, left, needle core biopsy, stereotactic, upper inner quadrant - FIBROCYSTIC CHANGES INCLUDING APOCRINE METAPLASIA WITH CALCIFICATIONS - NO MALIGNANCY IDENTIFIED 2. Breast, left, needle core biopsy, ultrasound, 1:30 o'clock - INVASIVE MAMMARY CARCINOMA - SEE COMMENT 3. Lymph node, needle/core biopsy, ultrasound, left axillary - METASTIC CARCINOMA INVOLVING ONE LYMPH NODE (1/1) Microscopic Comment 2. The biopsy material shows an infiltrative proliferation of cells with large vesicular nuclei with conspicuous nucleoli, arranged linearly and in small clusters. There are scattered cells with marked pleomorphism and cytologic atypia. Based on the biopsy, the carcinoma appears Nottingham grade 3 of 3 and measures 0.9 cm in greatest linear extent. E-cadherin and prognostic markers (ER/PR/ki-67/HER2-FISH)are pending and will be reported in an addendum. Dr. Tresa Moore reviewed the case and agrees with the above diagnosis. This case was called to [facility] on December 30, 2016. Results: IMMUNOHISTOCHEMICAL AND MORPHOMETRIC ANALYSIS PERFORMED MANUALLY Estrogen Receptor: 15%, POSITIVE, WEAK STAINING INTENSITY Progesterone Receptor: 0%,  NEGATIVE Proliferation Marker Ki67: 10% COMMENT: The negative hormone receptor study(ies) in this case has An internal positive control. Results: HER2 - NEGATIVE RATIO OF HER2/CEP17 SIGNALS  1.[place] NUMBER PER CELL 3.10   PROCEDURES  ECHO 01/13/17 Study Conclusions - Left ventricle: The cavity size was normal. Systolic function was   normal. The estimated ejection fraction was in the range of 60%   to 65%. Wall motion was normal; there were no regional wall   motion abnormalities. Left ventricular diastolic function   parameters were normal. - Mitral valve: There was trivial regurgitation. - Right atrium: The atrium was mildly dilated. - Tricuspid valve: There was trivial regurgitation.  RADIOGRAPHIC STUDIES: I have personally reviewed the radiological images as listed and agreed with the findings in the report. Mr Breast Bilateral W Wo Contrast  Result Date: 06/12/2017 CLINICAL DATA:  Restaging following neoadjuvant chemotherapy for left breast carcinoma diagnosed on 12/29/2016. No family history of breast cancer, but states that she does have genetic mutation. Patient had biopsy in 8/18 that revealed cancer. Patient started chemo 9/18. Patient did feel the mass 7/18. Malignant neoplasm of upper-outer quadrant of left breast estrogen receptor positive. LABS:  Not indicated EXAM: BILATERAL BREAST MRI WITH AND WITHOUT CONTRAST TECHNIQUE: Multiplanar, multisequence MR images of both breasts were obtained prior to and following the intravenous administration of 19 ml of MultiHance. THREE-DIMENSIONAL MR IMAGE RENDERING ON INDEPENDENT WORKSTATION: Three-dimensional MR images were rendered by post-processing of the original MR data on an independent workstation. The three-dimensional MR images were interpreted, and findings are reported in the following complete MRI report for this study. Three dimensional images were evaluated at the independent DynaCad workstation COMPARISON:  Prior  breast MRI, 01/13/2017. Previous mammograms and ultrasound. FINDINGS: Breast composition: c. Heterogeneous fibroglandular tissue. Background parenchymal enhancement: Moderate. Right breast: No mass or abnormal enhancement. Left breast: The known left breast malignancy has significantly decreased in size. It currently measures 1.2 x 1.0 x 1.6 cm, previously 2.5 x 1.3 x 1.8 cm. Artifact from the biopsy clip lies within the central aspect of the remaining enhancing mass. There are no other areas of abnormal left breast enhancement to suggest additional areas of malignancy. Lymph nodes: The previously noted enlarged left axillary lymph nodes have significantly decreased in size. The largest remaining node measures 11 x 8 mm. The previously biopsied lymph node measures 10 x 5 mm. No new adenopathy. No abnormal or enlarged right axillary lymph nodes. Ancillary findings:  None. IMPRESSION: 1. Significant positive response to neoadjuvant chemotherapy. The index carcinoma in the left breast has significantly decreased in size. Left axillary adenopathy has also significantly improved. 2. No other evidence of malignancy. RECOMMENDATION: 1. Treatment as planned for the known left breast carcinoma. BI-RADS CATEGORY  6: Known biopsy-proven malignancy. Electronically Signed   By: Lajean Manes M.D.   On: 06/12/2017 11:17   ASSESSMENT & PLAN:  Kathryn Stanley is a 42 y.o. premenopausal female with no significant past medical history, presented with a palpable left breast mass.  1. Malignant neoplasm of upper-outer quadrant of left breast in female, ductal carcinoma, cT2N1M0, Stage IIIa, ER weakly +, PR - , HER2 - , Grade 3 -We reviewed her mammogram and initial biopsy results in details with patient and her husband.  -Her tumor is weakly ER positive, PR negative, HER-2 negative, grade 3, she has positive lymph nodes, those are all high risks for recurrence. I recommend neoadjuvant or adjuvant chemotherapy to reduce her risk  of recurrence after surgery. -We discussed that the benefit of neoadjuvant chemotherapy, to shrink the tumor and lymph nodes, and potentially she would be a candidate for lumpectomy and may  not need lymph node dissection if she has good response. I have discussed this with her surgeon Dr. Donne Hazel, both of [place] recommend her to consider neoadjuvant chemotherapy. She agrees.  -we reviewed her staging scan CT from 01/15/17 in person. A few hypodense lesion were seen in the liver, appear to be benign cysts. CT and bone scan otherwise negative for distant metastasis. 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 This was discussed with patient. -I recommended neoadjuvant chemotherapy with dose dense Adriamycin and Cytoxan (AC) every 2 weeks for 4 cycles, followed by weekly Taxol for 12 weeks. She agreed. -She completed AC treatment 01/16/17-02/27/17. She tolerated well other than bone pain and constipation. She has had good clinical response to treatment as her tumor is slightly smaller and her lymph node is no longer palpable upon 03/13/17 breast exam.  -She started her weekly taxol 03/13/17, tolerating well so far -We added Carboplatin with cycle 4 on 04/03/17. She tolerated well. Due to symptomatic thrombocytopenia we held Carbo since cycle 8. Cycle 9 was post poned due to neutropenia and Granix was added starting 05/12/17. We added carbo at reduced dose AUC 1.5 to her taxol with cycle 10. She will complete her CT treatment on 12/28/8 -She will proceed with her bilateral mastectomy on 07/16/26.  -Pt's breast MRI on 06/12/17 revealed significant positive response to neoadjuvant chemotherapy. The index carcinoma in the left breast has significantly decreased in size. Left axillary adenopathy has also significantly improved. There is no other evidence of malignancy. -Pt's surgery is scheduled for 07/16/17.  -I explained if she still has significant residual disease after her surgery I would recommend adjuvant  Xeloda.  -I discussed after surgery she will have adjuvant radiation then anti-estrogen therapy Tamoxifen. There is not much benefit to put her on Tamoxifen before surgery as she is weekly ER positive and the risk of thrombosis from tamoxifen and surgery.  -pt has an appointment with Dr. Denman George on 07/31/17 to discuss and schedule an SBO. -Labs today (06/23/17) are WNL, anemia has resolved, she has mild transaminitis, possible related to chemotherapy.  We will continue monitoring. -F/u in 2 weeks after surgery    2. Genetics: BRCA1 + -Due to her young age of diagnosis I suggested she get genetic testing. 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  -She is agreeable with bilateral oophorectomy and mastectomy, she has seen her breast surgeon Dr. Donne Hazel I again and reviewed bilateral mastectomy and reconstruction.  She has been seen by plastic surgeon Dr. Ashley Mariner -I explained when her children get older they need to get tested and start cancer screenings earlier.  -I will refer her to GYN oncologist Dr. Denman George for BSO  3. Financial Support -They are not insured and are attempting to get an orange card.  -They have met our financial support/social worker  4. Lower Extremity Swelling  -This is likely secondary to chemotherapy, I do not suspect blood clot. She does elevate legs and this helps; she can wear compression stockings. She did try lasix with some improvement  -Swelling has not reduced with elevation and continues to persist.  -Will get a doppler of LE today  -Her Doppler of lower extremities were negative for DVT. Pt is aware of the result. (06/05/17)  5. Transmantitis  -05/22/17 show increase in AST at 44 and ALT  at 102, likely secondary to chemotherapy -She still has  a mild intermittent transaminitis, will continue monitoring   PLAN:  Breast MRI findings and lab reviewed with patient, she has had good partial response to neoadjuvant chemotherapy, will proceed to surgery next  lab and f/u the week of 2/25 after surgery 2/7, will review her path and discuss adjuvant Xeloda on next visit   All questions were answered. The patient knows to call the clinic with any problems, questions or concerns. I spent 20 minutes counseling the patient face to face. The total time spent in the appointment was 30 minutes and more than 50% was on counseling.  This document serves as a record of services personally performed by [name], MD. It was created on her behalf by Theresia Bough, a trained medical scribe. The creation of this record is based on the scribe's personal observations and the provider's statements to them.   I have reviewed the above documentation for accuracy and completeness, and I agree with the above.   [name]  06/23/2017

## 2017-06-23 ENCOUNTER — Inpatient Hospital Stay: Payer: Medicaid Other

## 2017-06-23 ENCOUNTER — Inpatient Hospital Stay: Payer: Medicaid Other | Attending: Hematology | Admitting: Hematology

## 2017-06-23 ENCOUNTER — Telehealth: Payer: Self-pay | Admitting: Hematology

## 2017-06-23 VITALS — BP 121/85 | HR 87 | Temp 97.7°F | Resp 18 | Wt 202.3 lb

## 2017-06-23 DIAGNOSIS — G629 Polyneuropathy, unspecified: Secondary | ICD-10-CM | POA: Insufficient documentation

## 2017-06-23 DIAGNOSIS — C50412 Malignant neoplasm of upper-outer quadrant of left female breast: Secondary | ICD-10-CM

## 2017-06-23 DIAGNOSIS — G893 Neoplasm related pain (acute) (chronic): Secondary | ICD-10-CM | POA: Diagnosis not present

## 2017-06-23 DIAGNOSIS — Z87891 Personal history of nicotine dependence: Secondary | ICD-10-CM | POA: Diagnosis not present

## 2017-06-23 DIAGNOSIS — Z170 Estrogen receptor positive status [ER+]: Principal | ICD-10-CM

## 2017-06-23 DIAGNOSIS — M7989 Other specified soft tissue disorders: Secondary | ICD-10-CM | POA: Insufficient documentation

## 2017-06-23 DIAGNOSIS — Z79899 Other long term (current) drug therapy: Secondary | ICD-10-CM | POA: Insufficient documentation

## 2017-06-23 DIAGNOSIS — Z95828 Presence of other vascular implants and grafts: Secondary | ICD-10-CM

## 2017-06-23 DIAGNOSIS — K219 Gastro-esophageal reflux disease without esophagitis: Secondary | ICD-10-CM | POA: Diagnosis not present

## 2017-06-23 DIAGNOSIS — Z9221 Personal history of antineoplastic chemotherapy: Secondary | ICD-10-CM | POA: Diagnosis not present

## 2017-06-23 DIAGNOSIS — Z1501 Genetic susceptibility to malignant neoplasm of breast: Secondary | ICD-10-CM | POA: Insufficient documentation

## 2017-06-23 LAB — CBC WITH DIFFERENTIAL/PLATELET
BASOS ABS: 0 10*3/uL (ref 0.0–0.1)
Basophils Relative: 1 %
Eosinophils Absolute: 0 10*3/uL (ref 0.0–0.5)
Eosinophils Relative: 1 %
HEMATOCRIT: 35.4 % (ref 34.8–46.6)
HEMOGLOBIN: 12.1 g/dL (ref 11.6–15.9)
LYMPHS PCT: 32 %
Lymphs Abs: 1.3 10*3/uL (ref 0.9–3.3)
MCH: 30.8 pg (ref 25.1–34.0)
MCHC: 34.1 g/dL (ref 31.5–36.0)
MCV: 90.2 fL (ref 79.5–101.0)
Monocytes Absolute: 0.4 10*3/uL (ref 0.1–0.9)
Monocytes Relative: 10 %
NEUTROS PCT: 56 %
Neutro Abs: 2.2 10*3/uL (ref 1.5–6.5)
Platelets: 207 10*3/uL (ref 145–400)
RBC: 3.93 MIL/uL (ref 3.70–5.45)
RDW: 17.1 % — ABNORMAL HIGH (ref 11.2–16.1)
WBC: 3.9 10*3/uL (ref 3.9–10.3)

## 2017-06-23 LAB — COMPREHENSIVE METABOLIC PANEL
ALBUMIN: 3.8 g/dL (ref 3.5–5.0)
ALT: 90 U/L — AB (ref 0–55)
AST: 54 U/L — AB (ref 5–34)
Alkaline Phosphatase: 71 U/L (ref 40–150)
Anion gap: 8 (ref 3–11)
BILIRUBIN TOTAL: 0.4 mg/dL (ref 0.2–1.2)
BUN: 18 mg/dL (ref 7–26)
CO2: 24 mmol/L (ref 22–29)
CREATININE: 0.83 mg/dL (ref 0.60–1.10)
Calcium: 9.1 mg/dL (ref 8.4–10.4)
Chloride: 107 mmol/L (ref 98–109)
GFR calc Af Amer: >60 mL/min (ref >60)
GLUCOSE: 93 mg/dL (ref 70–140)
Potassium: 3.7 mmol/L (ref 3.3–4.7)
Sodium: 139 mmol/L (ref 136–145)
TOTAL PROTEIN: 6.6 g/dL (ref 6.4–8.3)

## 2017-06-23 MED ORDER — HEPARIN SOD (PORK) LOCK FLUSH 100 UNIT/ML IV SOLN
500.0000 [IU] | Freq: Once | INTRAVENOUS | Status: AC
  Administered 2017-06-23: 500 [IU]
  Filled 2017-06-23: qty 5

## 2017-06-23 MED ORDER — SODIUM CHLORIDE 0.9% FLUSH
10.0000 mL | Freq: Once | INTRAVENOUS | Status: AC
  Administered 2017-06-23: 10 mL
  Filled 2017-06-23: qty 10

## 2017-06-23 NOTE — Patient Instructions (Signed)
Implanted Port Home Guide An implanted port is a type of central line that is placed under the skin. Central lines are used to provide IV access when treatment or nutrition needs to be given through a person's veins. Implanted ports are used for long-term IV access. An implanted port may be placed because:  You need IV medicine that would be irritating to the small veins in your hands or arms.  You need long-term IV medicines, such as antibiotics.  You need IV nutrition for a long period.  You need frequent blood draws for lab tests.  You need dialysis.  Implanted ports are usually placed in the chest area, but they can also be placed in the upper arm, the abdomen, or the leg. An implanted port has two main parts:  Reservoir. The reservoir is round and will appear as a small, raised area under your skin. The reservoir is the part where a needle is inserted to give medicines or draw blood.  Catheter. The catheter is a thin, flexible tube that extends from the reservoir. The catheter is placed into a large vein. Medicine that is inserted into the reservoir goes into the catheter and then into the vein.  How will I care for my incision site? Do not get the incision site wet. Bathe or shower as directed by your health care provider. How is my port accessed? Special steps must be taken to access the port:  Before the port is accessed, a numbing cream can be placed on the skin. This helps numb the skin over the port site.  Your health care provider uses a sterile technique to access the port. ? Your health care provider must put on a mask and sterile gloves. ? The skin over your port is cleaned carefully with an antiseptic and allowed to dry. ? The port is gently pinched between sterile gloves, and a needle is inserted into the port.  Only "non-coring" port needles should be used to access the port. Once the port is accessed, a blood return should be checked. This helps ensure that the port  is in the vein and is not clogged.  If your port needs to remain accessed for a constant infusion, a clear (transparent) bandage will be placed over the needle site. The bandage and needle will need to be changed every week, or as directed by your health care provider.  Keep the bandage covering the needle clean and dry. Do not get it wet. Follow your health care provider's instructions on how to take a shower or bath while the port is accessed.  If your port does not need to stay accessed, no bandage is needed over the port.  What is flushing? Flushing helps keep the port from getting clogged. Follow your health care provider's instructions on how and when to flush the port. Ports are usually flushed with saline solution or a medicine called heparin. The need for flushing will depend on how the port is used.  If the port is used for intermittent medicines or blood draws, the port will need to be flushed: ? After medicines have been given. ? After blood has been drawn. ? As part of routine maintenance.  If a constant infusion is running, the port may not need to be flushed.  How long will my port stay implanted? The port can stay in for as long as your health care provider thinks it is needed. When it is time for the port to come out, surgery will be   done to remove it. The procedure is similar to the one performed when the port was put in. When should I seek immediate medical care? When you have an implanted port, you should seek immediate medical care if:  You notice a bad smell coming from the incision site.  You have swelling, redness, or drainage at the incision site.  You have more swelling or pain at the port site or the surrounding area.  You have a fever that is not controlled with medicine.  This information is not intended to replace advice given to you by your health care provider. Make sure you discuss any questions you have with your health care provider. Document  Released: 05/26/2005 Document Revised: 11/01/2015 Document Reviewed: 01/31/2013 Elsevier Interactive Patient Education  2017 [employer]  

## 2017-06-23 NOTE — Telephone Encounter (Signed)
Gave avs and calendar for february °

## 2017-06-25 ENCOUNTER — Encounter: Payer: Self-pay | Admitting: Hematology

## 2017-06-25 NOTE — H&P (Signed)
Subjective:     Patient ID: Kathryn Stanley is a 42 y.o. female.  HPI  Here for follow up discussion breast reconstruction prior to planned bilateral mastectomies. Presented with palpable mass. MMG with mass 2.3 x 1.6 x 3.0 cm in the 130 o'clock e left breast 8cmfn with enlarged axillary LN . Indeterminate group of calcifications in the upper central portion of the left breast. Biopsy of 130 mass IDC, ER+ weak, PR-, Her2 -. LN positive for metastatic disease. MRI demonstrated 2.5 x 1.3 x 1.8 cm mass UOQ. Enlarged level I and level II left axillary adenopathy noted, including level II LN behind the pectoralis muscle.  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  Staging studies negative for distant disease.  Completed neoadjuvant chemotherapy. She will receive adjuvant radiation.   Final MRI with decrease size mass to 1.2 x 1.0 x 1.6 cm, previously 2.5 x 1.3 x 1.8 cm. The previously noted enlarged LN decreased in size.   Current 42C, happy wit this. Wt within 5-10 lb of current over last year.  Works at [employer].  Quit smoking 03/2017, off nicotine patch  Review of Systems     Objective:   Physical Exam  Constitutional: She is oriented to person, place, and time.  Cardiovascular: Normal rate.   Pulmonary/Chest: Effort normal.  Abdominal: Soft.  Lymphadenopathy:    She has no axillary adenopathy.  Neurological: She is alert and oriented to person, place, and time.  Skin:  Fitzpatrick 2     grade 2 ptosis bilateral, R>L volume Palpable mass left UOQ SN to nipple R 28 L 27.5 BW R 18 L 17 cm (CW 12 cm) Nipple to IMF R 8 L 7 cm  Assessment:   Left breast ca UOQ ER+ BRCA1 Neoadjuvant chemotherapy    Plan:      Plan bilateral skin reduction pattern mastectomies with prepectoral tissue expander placement.  Reviewed incisions, drains, OR length, hospital stay and recovery for each. Discussed process  of expansion and implant based risks including rupture, MRI surveillance for silicone implants, infection requiring surgery or removal, contracture. Anchor type scar reviewed. Reviewed post mastectomy and each reconstruction discusedwill be asensate and not stimulate. Reviewed with both risks mastectomy flap necrosis requiring additional surgery.  Discussed future surgery dependent on adjuvant treatments. This includes radiation- reviewed this significantly increases risk reconstruction including wound healing problems capsular contracture. Expanders will also be in place for several months (approximately 6 months from end of radiation) before continuing reconstruction process. At that time could do implant exchange alone, implant exchange with LD flap for radiated chest, or coversion to autologous.   Discussed use of acellular dermis in reconstruction, cadaveric source, incorporation over several weeks, risk that if has seroma or infection can act as additional nidus for infection if not incorporated.  Discussed prepectoral vs sub pectoral reconstruction. Discussed with patient and benefit of this is no animation deformity, may be less pain. Risk may be more visible rippling over upper poles, greater need of ADM. Reviewed pre pectoral would require larger amount acellular dermis, more drains. Discussed any type reconstruction also risks long term displacement implant and visible rippling. If prepectoral counseled I would recommend she be comfortable with silicone implants as more options that have less rippling. She agrees to prepectoral placement.  Reviewed reconstruction will be asensate and not stimulate. Reviewed additional risks including but not limited to risks mastectomy flap necrosis requiring additional surgery, seroma, hematoma, asymmetry, need to  additional procedures, fat necrosis, DVT/PE, damage to adjacent structures, cardiopulmonary complications.  Irene Limbo, MD [facility] [phone], pin [phone]

## 2017-07-08 NOTE — Pre-Procedure Instructions (Signed)
    Kathryn Stanley  07/08/2017      [place], [place] - [place] [address] Phone: [phone] Fax: [phone]    Your procedure is scheduled on Thurs. Feb. 7  Report to [facility] Admitting at 5:30 A.M.  Call this number if you have problems the morning of surgery:  [phone]   Remember:  Do not eat food or drink liquids after midnight on Wed. Feb. 6   Take these medicines the morning of surgery with A SIP OF WATER : famotidine (pepcid)              7 days prior to surgery STOP taking any Aspirin(unless otherwise instructed by your surgeon), Aleve, Naproxen, Ibuprofen, Motrin, Advil, Goody's, BC's, all herbal medications, fish oil, and all vitamins              Please complete your PRE-SURGERY ENSURE that was given to before you leave your house the morning of surgery.  Please, if able, drink it in one setting. DO NOT SIP.   Do not wear jewelry, make-up or nail polish.  Do not wear lotions, powders, or perfumes, or deodorant.  Do not shave 48 hours prior to surgery.  Men may shave face and neck.  Do not bring valuables to the hospital.  [facility] is not responsible for any belongings or valuables.  Contacts, dentures or bridgework may not be worn into surgery.  Leave your suitcase in the car.  After surgery it may be brought to your room.  For patients admitted to the hospital, discharge time will be determined by your treatment team.  Patients discharged the day of surgery will not be allowed to drive home.   Special instructions:  [place]- Preparing For Surgery  Before surgery, you can play an important role. Because skin is not sterile, your skin needs to be as free of germs as possible.  Please follow these instructions carefully.   1. Shower the NIGHT BEFORE SURGERY and the MORNING OF SURGERY with dial soap.  2. If you chose to wash your hair, wash your hair first as  usual with your normal shampoo.  3. After you shampoo, rinse your hair and body thoroughly to remove the shampoo.  4. Wash thoroughly, paying special attention to the area where your surgery will be performed.  5. Thoroughly rinse your body with warm water from the neck down.  6. Pat yourself dry with a CLEAN TOWEL.  7. Wear CLEAN PAJAMAS to bed the night before surgery, wear comfortable clothes the morning of surgery  8. Place CLEAN SHEETS on your bed the night of your first shower and DO NOT SLEEP WITH PETS.    Day of Surgery: Do not apply any deodorants/lotions. Please wear clean clothes to the hospital/surgery center.      Please read over the following fact sheets that you were given. Coughing and Deep Breathing and Surgical Site Infection Prevention

## 2017-07-09 ENCOUNTER — Encounter (HOSPITAL_COMMUNITY)
Admission: RE | Admit: 2017-07-09 | Discharge: 2017-07-09 | Disposition: A | Discharge status: Home / Self Care | Payer: Medicaid Other | Source: Ambulatory Visit | Attending: General Surgery | Admitting: General Surgery

## 2017-07-09 ENCOUNTER — Encounter (HOSPITAL_COMMUNITY): Payer: Self-pay

## 2017-07-09 ENCOUNTER — Other Ambulatory Visit: Payer: Self-pay

## 2017-07-09 DIAGNOSIS — Z170 Estrogen receptor positive status [ER+]: Secondary | ICD-10-CM | POA: Diagnosis not present

## 2017-07-09 DIAGNOSIS — Z01818 Encounter for other preprocedural examination: Secondary | ICD-10-CM | POA: Insufficient documentation

## 2017-07-09 DIAGNOSIS — C50212 Malignant neoplasm of upper-inner quadrant of left female breast: Secondary | ICD-10-CM | POA: Insufficient documentation

## 2017-07-09 LAB — CBC
HCT: 38.9 % (ref 36.0–46.0)
Hemoglobin: 13.1 g/dL (ref 12.0–15.0)
MCH: 30 pg (ref 26.0–34.0)
MCHC: 33.7 g/dL (ref 30.0–36.0)
MCV: 89 fL (ref 78.0–100.0)
PLATELETS: 193 10*3/uL (ref 150–400)
RBC: 4.37 MIL/uL (ref 3.87–5.11)
RDW: 13.5 % (ref 11.5–15.5)
WBC: 4.8 10*3/uL (ref 4.0–10.5)

## 2017-07-09 NOTE — Progress Notes (Signed)
PCP: Dr. [name] @ [facility]

## 2017-07-13 ENCOUNTER — Other Ambulatory Visit: Payer: Self-pay | Admitting: General Surgery

## 2017-07-15 ENCOUNTER — Other Ambulatory Visit: Payer: Self-pay | Admitting: General Surgery

## 2017-07-15 ENCOUNTER — Ambulatory Visit
Admission: RE | Admit: 2017-07-15 | Discharge: 2017-07-15 | Disposition: A | Discharge status: Home / Self Care | Payer: Medicaid Other | Source: Ambulatory Visit | Attending: General Surgery | Admitting: General Surgery

## 2017-07-15 ENCOUNTER — Inpatient Hospital Stay: Admission: RE | Admit: 2017-07-15 | Payer: Medicaid Other | Source: Ambulatory Visit

## 2017-07-15 ENCOUNTER — Encounter (HOSPITAL_COMMUNITY): Payer: Self-pay | Admitting: Anesthesiology

## 2017-07-15 DIAGNOSIS — Z170 Estrogen receptor positive status [ER+]: Principal | ICD-10-CM

## 2017-07-15 DIAGNOSIS — C50212 Malignant neoplasm of upper-inner quadrant of left female breast: Secondary | ICD-10-CM

## 2017-07-15 NOTE — Anesthesia Preprocedure Evaluation (Addendum)
Anesthesia Evaluation  Patient identified by MRN, date of birth, ID band Patient awake    Reviewed: Allergy & Precautions, NPO status , Patient's Chart, lab work & pertinent test results  Airway Mallampati: I       Dental no notable dental hx. (+) Teeth Intact   Pulmonary former smoker,    Pulmonary exam normal breath sounds clear to auscultation       Cardiovascular negative cardio ROS Normal cardiovascular exam Rhythm:Regular Rate:Normal     Neuro/Psych negative neurological ROS  negative psych ROS   GI/Hepatic Neg liver ROS, GERD  Medicated,  Endo/Other  negative endocrine ROS  Renal/GU negative Renal ROS     Musculoskeletal negative musculoskeletal ROS (+)   Abdominal (+) + obese,   Peds  Hematology negative hematology ROS (+)   Anesthesia Other Findings   Reproductive/Obstetrics                          Anesthesia Physical Anesthesia Plan  ASA: II  Anesthesia Plan: General   Post-op Pain Management:  Regional for Post-op pain   Induction: Intravenous  PONV Risk Score and Plan: 4 or greater and Ondansetron, Dexamethasone and Scopolamine patch - Pre-op  Airway Management Planned: Oral ETT  Additional Equipment:   Intra-op Plan:   Post-operative Plan: Extubation in OR  Informed Consent: I have reviewed the patients History and Physical, chart, labs and discussed the procedure including the risks, benefits and alternatives for the proposed anesthesia with the patient or authorized representative who has indicated his/her understanding and acceptance.   Dental advisory given  Plan Discussed with: CRNA and Surgeon  Anesthesia Plan Comments:        Anesthesia Quick Evaluation

## 2017-07-16 ENCOUNTER — Encounter (HOSPITAL_COMMUNITY)
Admission: RE | Disposition: A | Discharge status: Home / Self Care | Payer: Self-pay | Source: Ambulatory Visit | Attending: General Surgery

## 2017-07-16 ENCOUNTER — Ambulatory Visit (HOSPITAL_COMMUNITY): Payer: Medicaid Other | Admitting: Anesthesiology

## 2017-07-16 ENCOUNTER — Other Ambulatory Visit: Payer: Self-pay

## 2017-07-16 ENCOUNTER — Ambulatory Visit
Admission: RE | Admit: 2017-07-16 | Discharge: 2017-07-16 | Disposition: A | Discharge status: Home / Self Care | Payer: Medicaid Other | Source: Ambulatory Visit | Attending: General Surgery | Admitting: General Surgery

## 2017-07-16 ENCOUNTER — Encounter (HOSPITAL_COMMUNITY): Payer: Self-pay | Admitting: Certified Registered Nurse Anesthetist

## 2017-07-16 ENCOUNTER — Observation Stay (HOSPITAL_COMMUNITY)
Admission: RE | Admit: 2017-07-16 | Discharge: 2017-07-17 | Disposition: A | Discharge status: Home / Self Care | Payer: Medicaid Other | Source: Ambulatory Visit | Attending: General Surgery | Admitting: General Surgery

## 2017-07-16 ENCOUNTER — Ambulatory Visit (HOSPITAL_COMMUNITY)
Admission: RE | Admit: 2017-07-16 | Discharge: 2017-07-16 | Disposition: A | Discharge status: Home / Self Care | Payer: Medicaid Other | Source: Ambulatory Visit | Attending: General Surgery | Admitting: General Surgery

## 2017-07-16 DIAGNOSIS — Z1501 Genetic susceptibility to malignant neoplasm of breast: Secondary | ICD-10-CM | POA: Diagnosis not present

## 2017-07-16 DIAGNOSIS — Z87891 Personal history of nicotine dependence: Secondary | ICD-10-CM | POA: Insufficient documentation

## 2017-07-16 DIAGNOSIS — Z170 Estrogen receptor positive status [ER+]: Secondary | ICD-10-CM | POA: Diagnosis not present

## 2017-07-16 DIAGNOSIS — C50212 Malignant neoplasm of upper-inner quadrant of left female breast: Secondary | ICD-10-CM | POA: Diagnosis not present

## 2017-07-16 DIAGNOSIS — C773 Secondary and unspecified malignant neoplasm of axilla and upper limb lymph nodes: Secondary | ICD-10-CM | POA: Diagnosis not present

## 2017-07-16 DIAGNOSIS — C50012 Malignant neoplasm of nipple and areola, left female breast: Secondary | ICD-10-CM

## 2017-07-16 DIAGNOSIS — K219 Gastro-esophageal reflux disease without esophagitis: Secondary | ICD-10-CM | POA: Insufficient documentation

## 2017-07-16 DIAGNOSIS — Z4001 Encounter for prophylactic removal of breast: Secondary | ICD-10-CM | POA: Insufficient documentation

## 2017-07-16 DIAGNOSIS — Z9221 Personal history of antineoplastic chemotherapy: Secondary | ICD-10-CM | POA: Insufficient documentation

## 2017-07-16 HISTORY — PX: NIPPLE SPARING MASTECTOMY: SHX6537

## 2017-07-16 HISTORY — PX: PORT-A-CATH REMOVAL: SHX5289

## 2017-07-16 HISTORY — DX: Malignant neoplasm of unspecified site of left female breast: C50.912

## 2017-07-16 HISTORY — PX: PORTA CATH REMOVAL: CATH118286

## 2017-07-16 HISTORY — PX: MASTECTOMY: SHX3

## 2017-07-16 HISTORY — PX: BREAST RECONSTRUCTION: SHX9

## 2017-07-16 HISTORY — PX: MASTECTOMY WITH RADIOACTIVE SEED GUIDED EXCISION AND AXILLARY SENTINEL LYMPH NODE BIOPSY: SHX6736

## 2017-07-16 LAB — POCT PREGNANCY, URINE: Preg Test, Ur: NEGATIVE

## 2017-07-16 SURGERY — MASTECTOMY WITH RADIOACTIVE SEED GUIDED EXCISION AND AXILLARY SENTINEL LYMPH NODE BIOPSY
Anesthesia: General | Site: Chest | Laterality: Right

## 2017-07-16 MED ORDER — MIDAZOLAM HCL 2 MG/2ML IJ SOLN
INTRAMUSCULAR | Status: AC
  Filled 2017-07-16: qty 2

## 2017-07-16 MED ORDER — PHENYLEPHRINE 40 MCG/ML (10ML) SYRINGE FOR IV PUSH (FOR BLOOD PRESSURE SUPPORT)
PREFILLED_SYRINGE | INTRAVENOUS | Status: DC | PRN
  Administered 2017-07-16 (×3): 80 ug via INTRAVENOUS

## 2017-07-16 MED ORDER — METHOCARBAMOL 500 MG PO TABS
500.0000 mg | ORAL_TABLET | Freq: Four times a day (QID) | ORAL | Status: DC | PRN
  Administered 2017-07-16 – 2017-07-17 (×3): 500 mg via ORAL
  Filled 2017-07-16 (×3): qty 1

## 2017-07-16 MED ORDER — FENTANYL CITRATE (PF) 250 MCG/5ML IJ SOLN
INTRAMUSCULAR | Status: DC | PRN
  Administered 2017-07-16 (×9): 50 ug via INTRAVENOUS

## 2017-07-16 MED ORDER — LIDOCAINE 2% (20 MG/ML) 5 ML SYRINGE
INTRAMUSCULAR | Status: AC
  Filled 2017-07-16: qty 5

## 2017-07-16 MED ORDER — ENSURE PRE-SURGERY PO LIQD
592.0000 mL | Freq: Once | ORAL | Status: DC
  Filled 2017-07-16: qty 592

## 2017-07-16 MED ORDER — MIDAZOLAM HCL 2 MG/2ML IJ SOLN
INTRAMUSCULAR | Status: DC | PRN
  Administered 2017-07-16 (×2): 1 mg via INTRAVENOUS

## 2017-07-16 MED ORDER — ENSURE PRE-SURGERY PO LIQD
296.0000 mL | Freq: Once | ORAL | Status: DC
  Filled 2017-07-16: qty 296

## 2017-07-16 MED ORDER — ONDANSETRON 4 MG PO TBDP: 4.0000 mg | ORAL_TABLET | Freq: Four times a day (QID) | ORAL | Status: DC | PRN

## 2017-07-16 MED ORDER — SUGAMMADEX SODIUM 200 MG/2ML IV SOLN
INTRAVENOUS | Status: DC | PRN
  Administered 2017-07-16: 200 mg via INTRAVENOUS

## 2017-07-16 MED ORDER — PROPOFOL 10 MG/ML IV BOLUS
INTRAVENOUS | Status: AC
Start: 2017-07-16 — End: ?
  Filled 2017-07-16: qty 20

## 2017-07-16 MED ORDER — DEXAMETHASONE SODIUM PHOSPHATE 10 MG/ML IJ SOLN
INTRAMUSCULAR | Status: AC
  Filled 2017-07-16: qty 1

## 2017-07-16 MED ORDER — POLYMYXIN B SULFATE 500000 UNITS IJ SOLR
INTRAMUSCULAR | Status: DC | PRN
  Administered 2017-07-16 (×2): 500 mL

## 2017-07-16 MED ORDER — SODIUM CHLORIDE 0.9 % IJ SOLN
INTRAVENOUS | Status: DC | PRN
  Administered 2017-07-16: 5 mL via INTRAMUSCULAR

## 2017-07-16 MED ORDER — FENTANYL CITRATE (PF) 250 MCG/5ML IJ SOLN
INTRAMUSCULAR | Status: AC
  Filled 2017-07-16: qty 5

## 2017-07-16 MED ORDER — ONDANSETRON HCL 4 MG/2ML IJ SOLN: 4.0000 mg | Freq: Four times a day (QID) | INTRAMUSCULAR | Status: DC | PRN

## 2017-07-16 MED ORDER — PANTOPRAZOLE SODIUM 40 MG IV SOLR
40.0000 mg | Freq: Every day | INTRAVENOUS | Status: DC
  Administered 2017-07-16: 40 mg via INTRAVENOUS
  Filled 2017-07-16: qty 40

## 2017-07-16 MED ORDER — ROCURONIUM BROMIDE 10 MG/ML (PF) SYRINGE
PREFILLED_SYRINGE | INTRAVENOUS | Status: DC | PRN
  Administered 2017-07-16 (×4): 20 mg via INTRAVENOUS
  Administered 2017-07-16: 40 mg via INTRAVENOUS
  Administered 2017-07-16: 20 mg via INTRAVENOUS

## 2017-07-16 MED ORDER — CIPROFLOXACIN IN D5W 400 MG/200ML IV SOLN
INTRAVENOUS | Status: AC
  Filled 2017-07-16: qty 200

## 2017-07-16 MED ORDER — ENOXAPARIN SODIUM 40 MG/0.4ML ~~LOC~~ SOLN: 40.0000 mg | SUBCUTANEOUS | Status: DC

## 2017-07-16 MED ORDER — KETOROLAC TROMETHAMINE 15 MG/ML IJ SOLN
INTRAMUSCULAR | Status: AC
  Filled 2017-07-16: qty 1

## 2017-07-16 MED ORDER — ACETAMINOPHEN 650 MG RE SUPP: 650.0000 mg | Freq: Four times a day (QID) | RECTAL | Status: DC | PRN

## 2017-07-16 MED ORDER — 0.9 % SODIUM CHLORIDE (POUR BTL) OPTIME
TOPICAL | Status: DC | PRN
  Administered 2017-07-16 (×2): 1000 mL

## 2017-07-16 MED ORDER — METHYLENE BLUE 0.5 % INJ SOLN
INTRAVENOUS | Status: AC
  Filled 2017-07-16: qty 10

## 2017-07-16 MED ORDER — OXYCODONE HCL 5 MG PO TABS
5.0000 mg | ORAL_TABLET | ORAL | Status: DC | PRN
  Administered 2017-07-16 – 2017-07-17 (×3): 10 mg via ORAL
  Filled 2017-07-16 (×3): qty 2

## 2017-07-16 MED ORDER — LIDOCAINE 2% (20 MG/ML) 5 ML SYRINGE
INTRAMUSCULAR | Status: DC | PRN
  Administered 2017-07-16: 100 mg via INTRAVENOUS

## 2017-07-16 MED ORDER — CIPROFLOXACIN IN D5W 400 MG/200ML IV SOLN
400.0000 mg | INTRAVENOUS | Status: AC
  Administered 2017-07-16: 400 mg via INTRAVENOUS

## 2017-07-16 MED ORDER — GABAPENTIN 300 MG PO CAPS
ORAL_CAPSULE | ORAL | Status: AC
  Filled 2017-07-16: qty 1

## 2017-07-16 MED ORDER — SUCCINYLCHOLINE CHLORIDE 200 MG/10ML IV SOSY
PREFILLED_SYRINGE | INTRAVENOUS | Status: AC
  Filled 2017-07-16: qty 10

## 2017-07-16 MED ORDER — ONDANSETRON HCL 4 MG/2ML IJ SOLN
INTRAMUSCULAR | Status: AC
  Filled 2017-07-16: qty 2

## 2017-07-16 MED ORDER — EPHEDRINE 5 MG/ML INJ
INTRAVENOUS | Status: AC
  Filled 2017-07-16: qty 10

## 2017-07-16 MED ORDER — SIMETHICONE 80 MG PO CHEW: 40.0000 mg | CHEWABLE_TABLET | Freq: Four times a day (QID) | ORAL | Status: DC | PRN

## 2017-07-16 MED ORDER — SUGAMMADEX SODIUM 200 MG/2ML IV SOLN
INTRAVENOUS | Status: AC
  Filled 2017-07-16: qty 2

## 2017-07-16 MED ORDER — PROMETHAZINE HCL 25 MG/ML IJ SOLN
6.2500 mg | INTRAMUSCULAR | Status: DC | PRN
  Administered 2017-07-16: 6.25 mg via INTRAVENOUS

## 2017-07-16 MED ORDER — LACTATED RINGERS IV SOLN
INTRAVENOUS | Status: DC | PRN
  Administered 2017-07-16: 07:00:00 via INTRAVENOUS

## 2017-07-16 MED ORDER — PROPOFOL 10 MG/ML IV BOLUS
INTRAVENOUS | Status: DC | PRN
  Administered 2017-07-16: 150 mg via INTRAVENOUS

## 2017-07-16 MED ORDER — ONDANSETRON HCL 4 MG/2ML IJ SOLN
INTRAMUSCULAR | Status: DC | PRN
  Administered 2017-07-16 (×2): 4 mg via INTRAVENOUS

## 2017-07-16 MED ORDER — DEXAMETHASONE SODIUM PHOSPHATE 10 MG/ML IJ SOLN
INTRAMUSCULAR | Status: DC | PRN
  Administered 2017-07-16: 10 mg via INTRAVENOUS

## 2017-07-16 MED ORDER — TECHNETIUM TC 99M SULFUR COLLOID FILTERED
1.0000 | Freq: Once | INTRAVENOUS | Status: AC | PRN
  Administered 2017-07-16: 1 via INTRADERMAL

## 2017-07-16 MED ORDER — KETOROLAC TROMETHAMINE 15 MG/ML IJ SOLN
INTRAMUSCULAR | Status: DC | PRN
  Administered 2017-07-16: 15 mg via INTRAVENOUS

## 2017-07-16 MED ORDER — MORPHINE SULFATE (PF) 4 MG/ML IV SOLN: 2.0000 mg | INTRAVENOUS | Status: DC | PRN

## 2017-07-16 MED ORDER — ACETAMINOPHEN 500 MG PO TABS
1000.0000 mg | ORAL_TABLET | ORAL | Status: AC
  Administered 2017-07-16: 1000 mg via ORAL
  Filled 2017-07-16: qty 2

## 2017-07-16 MED ORDER — PROMETHAZINE HCL 25 MG/ML IJ SOLN
INTRAMUSCULAR | Status: AC
  Filled 2017-07-16: qty 1

## 2017-07-16 MED ORDER — CIPROFLOXACIN IN D5W 400 MG/200ML IV SOLN: 400.0000 mg | INTRAVENOUS | Status: DC

## 2017-07-16 MED ORDER — SODIUM CHLORIDE 0.9 % IV SOLN
INTRAVENOUS | Status: DC
  Administered 2017-07-16: 15:00:00 via INTRAVENOUS

## 2017-07-16 MED ORDER — ROPIVACAINE HCL 5 MG/ML IJ SOLN
INTRAMUSCULAR | Status: DC | PRN
  Administered 2017-07-16 (×12): 5 mL via PERINEURAL

## 2017-07-16 MED ORDER — SODIUM CHLORIDE 0.9 % IJ SOLN
INTRAMUSCULAR | Status: AC
  Filled 2017-07-16: qty 10

## 2017-07-16 MED ORDER — PHENYLEPHRINE 40 MCG/ML (10ML) SYRINGE FOR IV PUSH (FOR BLOOD PRESSURE SUPPORT)
PREFILLED_SYRINGE | INTRAVENOUS | Status: AC
  Filled 2017-07-16: qty 10

## 2017-07-16 MED ORDER — GABAPENTIN 300 MG PO CAPS
300.0000 mg | ORAL_CAPSULE | ORAL | Status: AC
  Administered 2017-07-16: 300 mg via ORAL

## 2017-07-16 MED ORDER — ACETAMINOPHEN 325 MG PO TABS
650.0000 mg | ORAL_TABLET | Freq: Four times a day (QID) | ORAL | Status: DC | PRN
  Administered 2017-07-16: 650 mg via ORAL
  Filled 2017-07-16: qty 2

## 2017-07-16 SURGICAL SUPPLY — 88 items
ADH SKN CLS APL DERMABOND .7 (GAUZE/BANDAGES/DRESSINGS) ×4
ALLODERM CONTOUR PERF LRG RTU (Tissue) ×8 IMPLANT
ALLODERM LG CONTOUR PERF RTU (Tissue) ×4 IMPLANT
ALLODERM TISSUE 12X16 RTUTHICK (Tissue) ×6 IMPLANT
APPLIER CLIP 9.375 MED OPEN (MISCELLANEOUS) ×6
APR CLP MED 9.3 20 MLT OPN (MISCELLANEOUS) ×4
BAG DECANTER FOR FLEXI CONT (MISCELLANEOUS) ×4 IMPLANT
BINDER BREAST XXLRG (GAUZE/BANDAGES/DRESSINGS) ×3 IMPLANT
CANISTER SUCT 3000ML PPV (MISCELLANEOUS) ×9 IMPLANT
CLIP APPLIE 9.375 MED OPEN (MISCELLANEOUS) ×4 IMPLANT
CONT SPEC 4OZ CLIKSEAL STRL BL (MISCELLANEOUS) ×6 IMPLANT
COVER PROBE W GEL 5X96 (DRAPES) ×6 IMPLANT
COVER SURGICAL LIGHT HANDLE (MISCELLANEOUS) ×9 IMPLANT
DERMABOND ADVANCED (GAUZE/BANDAGES/DRESSINGS) ×2
DERMABOND ADVANCED .7 DNX12 (GAUZE/BANDAGES/DRESSINGS) ×1 IMPLANT
DEVICE DUBIN SPECIMEN MAMMOGRA (MISCELLANEOUS) ×6 IMPLANT
DRAIN CHANNEL 15F RND FF W/TCR (WOUND CARE) ×4 IMPLANT
DRAIN CHANNEL 19F RND (DRAIN) ×8 IMPLANT
DRAPE ORTHO SPLIT 87X125 STRL (DRAPES) ×6 IMPLANT
DRAPE UTILITY XL STRL (DRAPES) ×2 IMPLANT
DRSG PAD ABDOMINAL 8X10 ST (GAUZE/BANDAGES/DRESSINGS) ×8 IMPLANT
DRSG TEGADERM 4X4.75 (GAUZE/BANDAGES/DRESSINGS) ×12 IMPLANT
DURAPREP 26ML APPLICATOR (WOUND CARE) ×6 IMPLANT
ELECT BLADE 4.0 EZ CLEAN MEGAD (MISCELLANEOUS) ×6
ELECT BLADE 6.5 EXT (BLADE) ×6 IMPLANT
ELECT CAUTERY BLADE 6.4 (BLADE) ×6 IMPLANT
ELECT REM PT RETURN 9FT ADLT (ELECTROSURGICAL) ×6
ELECTRODE BLDE 4.0 EZ CLN MEGD (MISCELLANEOUS) ×4 IMPLANT
ELECTRODE REM PT RTRN 9FT ADLT (ELECTROSURGICAL) ×4 IMPLANT
EVACUATOR SILICONE 100CC (DRAIN) ×12 IMPLANT
EXPANDER BREAST TISSUE 400CC (Breast) ×4 IMPLANT
GLOVE BIO SURGEON STRL SZ 6 (GLOVE) ×9 IMPLANT
GLOVE BIO SURGEON STRL SZ7 (GLOVE) ×12 IMPLANT
GLOVE BIO SURGEON STRL SZ7.5 (GLOVE) ×2 IMPLANT
GLOVE BIOGEL PI IND STRL 6.5 (GLOVE) IMPLANT
GLOVE BIOGEL PI IND STRL 7.0 (GLOVE) ×1 IMPLANT
GLOVE BIOGEL PI IND STRL 7.5 (GLOVE) ×4 IMPLANT
GLOVE BIOGEL PI IND STRL 8.5 (GLOVE) IMPLANT
GLOVE BIOGEL PI INDICATOR 6.5 (GLOVE) ×4
GLOVE BIOGEL PI INDICATOR 7.0 (GLOVE) ×2
GLOVE BIOGEL PI INDICATOR 7.5 (GLOVE) ×6
GLOVE BIOGEL PI INDICATOR 8.5 (GLOVE) ×2
GLOVE ECLIPSE 7.0 STRL STRAW (GLOVE) ×4 IMPLANT
GLOVE ECLIPSE 8.0 STRL XLNG CF (GLOVE) ×2 IMPLANT
GLOVE SURG SS PI 6.0 STRL IVOR (GLOVE) ×6 IMPLANT
GLOVE SURG SS PI 6.5 STRL IVOR (GLOVE) ×6 IMPLANT
GOWN STRL REUS W/ TWL LRG LVL3 (GOWN DISPOSABLE) ×12 IMPLANT
GOWN STRL REUS W/ TWL XL LVL3 (GOWN DISPOSABLE) IMPLANT
GOWN STRL REUS W/TWL LRG LVL3 (GOWN DISPOSABLE) ×36
GOWN STRL REUS W/TWL XL LVL3 (GOWN DISPOSABLE) ×6
KIT BASIN OR (CUSTOM PROCEDURE TRAY) ×6 IMPLANT
LIGHT WAVEGUIDE WIDE FLAT (MISCELLANEOUS) ×6 IMPLANT
MARKER SKIN DUAL TIP RULER LAB (MISCELLANEOUS) ×6 IMPLANT
NDL 18GX1X1/2 (RX/OR ONLY) (NEEDLE) IMPLANT
NDL FILTER BLUNT 18X1 1/2 (NEEDLE) IMPLANT
NDL HYPO 25GX1X1/2 BEV (NEEDLE) ×4 IMPLANT
NEEDLE 18GX1X1/2 (RX/OR ONLY) (NEEDLE) ×6 IMPLANT
NEEDLE FILTER BLUNT 18X 1/2SAF (NEEDLE) ×2
NEEDLE FILTER BLUNT 18X1 1/2 (NEEDLE) ×4 IMPLANT
NEEDLE HYPO 25GX1X1/2 BEV (NEEDLE) ×6 IMPLANT
NS IRRIG 1000ML POUR BTL (IV SOLUTION) ×9 IMPLANT
PACK GENERAL/GYN (CUSTOM PROCEDURE TRAY) ×6 IMPLANT
PAD ABD 8X10 STRL (GAUZE/BANDAGES/DRESSINGS) ×2 IMPLANT
PAD ARMBOARD 7.5X6 YLW CONV (MISCELLANEOUS) ×12 IMPLANT
PIN SAFETY STERILE (MISCELLANEOUS) ×9 IMPLANT
PUNCH BIOPSY 4MM (MISCELLANEOUS) ×6
PUNCH BIOPSY DISP 4 (MISCELLANEOUS) ×1 IMPLANT
SOLUTION BETADINE 4OZ (MISCELLANEOUS) ×9 IMPLANT
SPECIMEN JAR X LARGE (MISCELLANEOUS) ×9 IMPLANT
SPONGE LAP 18X18 X RAY DECT (DISPOSABLE) ×6 IMPLANT
STAPLER VISISTAT 35W (STAPLE) ×6 IMPLANT
SUT CHROMIC 4 0 PS 2 18 (SUTURE) ×16 IMPLANT
SUT ETHILON 2 0 FS 18 (SUTURE) ×15 IMPLANT
SUT MNCRL AB 4-0 PS2 18 (SUTURE) ×10 IMPLANT
SUT VIC AB 0 CT2 27 (SUTURE) ×12 IMPLANT
SUT VIC AB 2-0 SH 27 (SUTURE) ×6
SUT VIC AB 2-0 SH 27X BRD (SUTURE) ×1 IMPLANT
SUT VIC AB 3-0 SH 27 (SUTURE) ×18
SUT VIC AB 3-0 SH 27X BRD (SUTURE) ×4 IMPLANT
SUT VIC AB 4-0 PS2 27 (SUTURE) ×4 IMPLANT
SUT VLOC 180 0 24IN GS25 (SUTURE) ×4 IMPLANT
SYR BULB IRRIGATION 50ML (SYRINGE) ×9 IMPLANT
SYR CONTROL 10ML LL (SYRINGE) ×6 IMPLANT
TISSUE ALLDRM CONTOUR LRG MED (Tissue) IMPLANT
TISSUE ALLDRM TISSUE 12X16 (Tissue) IMPLANT
TRAY FOLEY CATH 14FR (SET/KITS/TRAYS/PACK) ×3 IMPLANT
TUBE CONNECTING 12'X1/4 (SUCTIONS) ×1
TUBE CONNECTING 12X1/4 (SUCTIONS) ×1 IMPLANT

## 2017-07-16 NOTE — Op Note (Signed)
Preoperative diagnosis: left breast cancer, clinical stage II BRCA 1 mutation Postoperative diagnosis: same as above Procedure: 1. Right prophylactic skin sparing mastectomy 2. Port removal 3. Left skin sparing mastectomy 4. Left targeted axillary dissection (left seed guided node excision and left axillary sentinel node biopsy) Surgeon: Dr Serita Grammes Asst: Dr Frederick Peers EBL: 50 cc Specimen: 1. Right ssm short superior, long lateral 2. Left ssm short superior, long lateral 3. Left targeted node dissection containing seed Complications none Drains per plastic surgery dispo case turned over to plastic surgery  Indications: This is a 35 yof who presented with clinical stage II left breast cancer with positive node.  She has undergone primary chemotherapy with significant positive response.  She has also been diagnosed with a brca mutation.  I discussed bilateral ssm with reconstruction, port removal and a targeted axillary node dissection.  Procedure: After informed consent obtained patient was taken to the OR. She had undergone bilateral pectoral blocks. She was given cefazolin. She had scds in place.  She was placed under general anesthesia without complication. She was prepped and draped in standard sterile surgical fashion. A surgical timeout was performed.    I first did the prophylactic side.  I used the markings for incision by Dr Iran Planas for skin sparing mastectomy.  I created flaps to near clavicle, parasternal region, inframammary fold and the latissimus laterally.  The breast was then removed from the pectoralis muscle and included the fascia.  I then removed the port from the same incision and sutured the pocket together with 2-0 vicryl.  I obtained hemostasis and placed a moist sponge. I then made a circular incision around left nipple areola complex. I created flaps in similar fashion and removed breast tissue including fascia. This was marked as above and passed off.  I  then entered the axilla. I located the seed and excised several nodes around this that were all sentinel nodes.  There was no more radioactivity.  I then obtained hemostasis. The case was turned over to plastic surgery

## 2017-07-16 NOTE — Brief Op Note (Signed)
07/16/2017  9:29 AM  PATIENT:  Kathryn Stanley  42 y.o. female  PRE-OPERATIVE DIAGNOSIS:  BREAST CANCER, BRCA POSITIVE  POST-OPERATIVE DIAGNOSIS:  * No post-op diagnosis entered *  PROCEDURE:  Procedure(s): LEFT SKIN SPARING MASTECTOMY WITH LEFT RADIOACTIVE SEED TARGETED AXILLARY LYMPH NODE EXCISION AND AXILLARY SENTINEL LYMPH NODE BIOPSY (Left) RIGHT PROPHYLACTIC SKIN SPARING MASTECTOMY (Right) REMOVAL PORT-A-CATH (N/A) BILATERAL BREAST RECONSTRUCTION WITH PLACEMENT OF TISSUE EXPANDER AND ALLODERM (Bilateral)  SURGEON:  Surgeon(s) and Role: Panel 1:    [name], MD - Primary Panel 2:    * Irene Limbo, MD - Primary  PHYSICIAN ASSISTANT:   ASSISTANTS: Dr Iran Planas   ANESTHESIA:   general with pectoral block  EBL:  50 cc  BLOOD ADMINISTERED:none  DRAINS: none   LOCAL MEDICATIONS USED:  MARCAINE     SPECIMEN:  Simple Mastectomy  DISPOSITION OF SPECIMEN:  PATHOLOGY  COUNTS:  YES  TOURNIQUET:  * No tourniquets in log *  DICTATION: .Dragon Dictation  PLAN OF CARE: admit to outpatient   PATIENT DISPOSITION:  PACU - hemodynamically stable.   Delay start of Pharmacological VTE agent (>24hrs) due to surgical blood loss or risk of bleeding: no

## 2017-07-16 NOTE — Discharge Instructions (Signed)
[facility], [place] [phone]  MASTECTOMY: POST OP INSTRUCTIONS  Always review your discharge instruction sheet given to you by the facility where your surgery was performed. IF YOU HAVE DISABILITY OR FAMILY LEAVE FORMS, YOU MUST BRING THEM TO THE OFFICE FOR PROCESSING.   DO NOT GIVE THEM TO YOUR DOCTOR. A prescription for pain medication may be given to you upon discharge.  Take your pain medication as prescribed, if needed.  If narcotic pain medicine is not needed, then you may take acetaminophen (Tylenol), naprosyn (Alleve) or ibuprofen (Advil) as needed. 1. Take your usually prescribed medications unless otherwise directed. 2. If you need a refill on your pain medication, please contact your pharmacy.  They will contact our office to request authorization.  Prescriptions will not be filled after 5pm or on week-ends. 3. You should follow a light diet the first few days after arrival home, such as soup and crackers, etc.  Resume your normal diet the day after surgery. 4. Most patients will experience some swelling and bruising on the chest and underarm.  Ice packs will help.  Swelling and bruising can take several days to resolve. Wear the binder day and night until you return to the office.  5. It is common to experience some constipation if taking pain medication after surgery.  Increasing fluid intake and taking a stool softener (such as Colace) will usually help or prevent this problem from occurring.  A mild laxative (Milk of Magnesia or Miralax) should be taken according to package instructions if there are no bowel movements after 48 hours. 6. Unless discharge instructions indicate otherwise, leave your bandage dry and in place until your next appointment in 3-5 days.  You may take a limited sponge bath.  No tube baths or showers until the drains are removed.  You may have steri-strips (small skin tapes) in place directly over the incision.  These strips should be left on the  skin for 7-10 days. If you have glue it will come off in next couple week.  Any sutures will be removed at an office visit 7. DRAINS:  If you have drains in place, it is important to keep a list of the amount of drainage produced each day in your drains.  Before leaving the hospital, you should be instructed on drain care.  Call our office if you have any questions about your drains. I will remove your drains when they put out less than 30 cc or ml for 2 consecutive days. 8. ACTIVITIES:  You may resume regular (light) daily activities beginning the next day--such as daily self-care, walking, climbing stairs--gradually increasing activities as tolerated.  You may have sexual intercourse when it is comfortable.  Refrain from any heavy lifting or straining until approved by your doctor. a. You may drive when you are no longer taking prescription pain medication, you can comfortably wear a seatbelt, and you can safely maneuver your car and apply brakes. b. RETURN TO WORK:  __________________________________________________________ 9. You should see your doctor in the office for a follow-up appointment approximately 3-5 days after your surgery.  Your doctors nurse will typically make your follow-up appointment when she calls you with your pathology report.  Expect your pathology report 3-4business days after surgery. 10. OTHER INSTRUCTIONS: ______________________________________________________________________________________________ ____________________________________________________________________________________________ WHEN TO CALL YOUR DR Kathryn Stanley: 1. Fever over 101.0 2. Nausea and/or vomiting 3. Extreme swelling or bruising 4. Continued bleeding from incision. 5. Increased pain, redness, or drainage from the incision. The clinic staff is available  to answer your questions during regular business hours.  Please dont hesitate to call and ask to speak to one of the nurses for clinical concerns.  If  you have a medical emergency, go to the nearest emergency room or call 911.  A surgeon from [facility] is always on call at the hospital. [address] [place], [place], [place]  [address] ? [address], [place], [place]   [id number] [phone] ? [phone] ? FAX [phone] Web site: [email or web address]

## 2017-07-16 NOTE — Anesthesia Procedure Notes (Signed)
Procedure Name: Intubation Date/Time: 07/16/2017 7:52 AM Performed by: Julieta Bellini, CRNA Pre-anesthesia Checklist: Patient identified, Emergency Drugs available, Suction available and Patient being monitored Patient Re-evaluated:Patient Re-evaluated prior to induction Oxygen Delivery Method: Circle system utilized Preoxygenation: Pre-oxygenation with 100% oxygen Induction Type: IV induction Ventilation: Mask ventilation without difficulty Laryngoscope Size: Mac and 3 Grade View: Grade I Tube type: Oral Tube size: 7.0 mm Number of attempts: 1 Airway Equipment and Method: Stylet Placement Confirmation: ETT inserted through vocal cords under direct vision,  positive ETCO2 and breath sounds checked- equal and bilateral Secured at: 22 cm Tube secured with: Tape Dental Injury: Teeth and Oropharynx as per pre-operative assessment

## 2017-07-16 NOTE — Transfer of Care (Signed)
Immediate Anesthesia Transfer of Care Note  Patient: Kathryn Stanley  Procedure(s) Performed: LEFT SKIN SPARING MASTECTOMY WITH LEFT RADIOACTIVE SEED TARGETED AXILLARY LYMPH NODE EXCISION AND AXILLARY SENTINEL LYMPH NODE BIOPSY (Left Breast) RIGHT PROPHYLACTIC SKIN SPARING MASTECTOMY (Right Breast) REMOVAL PORT-A-CATH (N/A Chest) BILATERAL BREAST RECONSTRUCTION WITH PLACEMENT OF TISSUE EXPANDER AND ALLODERM (Bilateral Breast)  Patient Location: PACU  Anesthesia Type:General and GA combined with regional for post-op pain  Level of Consciousness: drowsy and patient cooperative  Airway & Oxygen Therapy: Patient Spontanous Breathing  Post-op Assessment: Report given to RN, Post -op Vital signs reviewed and stable and Patient moving all extremities X 4  Post vital signs: Reviewed and stable  Last Vitals:  Vitals:   07/16/17 0543 07/16/17 1147  BP: 129/73 132/89  Pulse:  98  Resp:  18  Temp:  37 C  SpO2:  98%    Last Pain:  Vitals:   07/16/17 0542  TempSrc: Oral         Complications: No apparent anesthesia complications

## 2017-07-16 NOTE — Interval H&P Note (Signed)
History and Physical Interval Note:  07/16/2017 6:58 AM  Kathryn Stanley  has presented today for surgery, with the diagnosis of BREAST CANCER, BRCA POSITIVE  The various methods of treatment have been discussed with the patient and family. After consideration of risks, benefits and other options for treatment, the patient has consented to  Procedure(s): LEFT SKIN SPARING MASTECTOMY WITH LEFT RADIOACTIVE SEED TARGETED AXILLARY LYMPH NODE EXCISION AND AXILLARY SENTINEL LYMPH NODE BIOPSY (Left) RIGHT PROPHYLACTIC SKIN SPARING MASTECTOMY (Right) REMOVAL PORT-A-CATH (N/A) BILATERAL BREAST RECONSTRUCTION WITH PLACEMENT OF TISSUE EXPANDER AND ALLODERM (Bilateral) as a surgical intervention .  The patient's history has been reviewed, patient examined, no change in status, stable for surgery.  I have reviewed the patient's chart and labs.  Questions were answered to the patient's satisfaction.     THIMMAPPA, BRINDA   

## 2017-07-16 NOTE — H&P (Signed)
Kathryn Stanley is an 42 y.o. female.   Chief Complaint: breast cancer HPI: . 47 yof I saw previously for left breast cancer as noted here: she underwent evaluation with mm and [place]. she has c density breasts. she was noted to have a 3x2.3x1.6 cm mass at 1:30 in the left breast with a 2.2 cm node on [place]. she also had a 5 mm area of calcs in the central breast. all three have been biopsied. the calcs are benign and concordant. 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 the node is positive. she had port placed and then has began primary chemotherapy. she believes you cannot palpate node anymore. 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 she had chemo delayed due to low counts. has completed chemotherapy. fu mri shows nl right breast, left breast with mass now 1.2x1x1.6 cm in size. the nodes are now significantly decreased as well. she is doing fine  Past Medical History:  Diagnosis Date  . Cancer [facility])    left breast cancer  . Dermatologic problem    possible psoriasis. Pt has not had been diagnosed by dermatology.  . 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 GERD (gastroesophageal reflux disease)   . Tobacco dependence    trying to quit with nicotene patches    Past Surgical History:  Procedure Laterality Date  . CESAREAN SECTION    . PORTACATH PLACEMENT N/A 01/14/2017   Procedure: INSERTION PORT-A-CATH WITH [place];  Surgeon: [name], MD;  Location: [facility];  Service: General;  Laterality: N/A;    Family History  Problem Relation Age of Onset  . Stroke Maternal Grandmother   . Stroke Father   . Hypertension Father   . Other Father        Adopted   Social History:  reports that she quit smoking about 7 months ago. Her smoking use included cigarettes. She has a 17.00 pack-year smoking history. she has never used smokeless tobacco. She reports that she does not drink alcohol  or use drugs.  Allergies:  Allergies  Allergen Reactions  . Amoxicillin Anaphylaxis and Rash    PATIENT HAS HAD A PCN REACTION WITH IMMEDIATE RASH, FACIAL/TONGUE/THROAT SWELLING, SOB, OR LIGHTHEADEDNESS WITH HYPOTENSION:  #  #  #  YES  #  #  #   Has patient had a PCN reaction causing severe rash involving mucus membranes or skin necrosis: No Has patient had a PCN reaction that required hospitalization: No Has patient had a PCN reaction occurring within the last 10 years: No If all of the above answers are "NO", then may proceed with Cephalosporin use.   . Chlorhexidine Gluconate Rash  . Nicotine Other (See Comments)    Patch caused soreness at application site    Medications Prior to Admission  Medication Sig Dispense Refill  . famotidine (PEPCID) 10 MG tablet Take 10 mg by mouth daily as needed for heartburn or indigestion.    Marland Kitchen ibuprofen (ADVIL,MOTRIN) 200 MG tablet Take 200-400 mg by mouth daily as needed for headache or moderate pain.    . furosemide (LASIX) 20 MG tablet Take 1 tablet (20 mg total) by mouth daily. Take for 3 days (Patient not taking: Reported on 05/22/2017) 10 tablet 0  . potassium chloride SA (K-DUR,KLOR-CON) 20 MEQ tablet Take 1 tablet (20 mEq total) by mouth daily. Take once  daily with lasix (Patient not taking: Reported on 05/22/2017) 10 tablet 0    Results for orders placed or performed during the hospital encounter of 07/16/17 (from the past 48 hour(s))  Pregnancy, urine POC     Status: None   Collection Time: 07/16/17  5:55 AM  Result Value Ref Range   Preg Test, Ur NEGATIVE NEGATIVE    Comment:        THE SENSITIVITY OF THIS METHODOLOGY IS >24 mIU/mL    Mm Clip Placement Left  Result Date: 07/15/2017 CLINICAL DATA:  Status post placement of seed in the left axilla prior to targeted axillary node dissection for known metastatic left breast cancer. EXAM: DIAGNOSTIC LEFT MAMMOGRAM POST ULTRASOUND-GUIDED RADIOACTIVE SEED PLACEMENT COMPARISON:  Previous  exam(s). FINDINGS: Mammographic images were obtained following ultrasound-guided radioactive seed placement. These demonstrate radioactive seed adjacent to a spiral shaped clip in the left axilla. IMPRESSION: Appropriate location of the radioactive seed. Final Assessment: Post Procedure Mammograms for Seed Placement Electronically Signed   By: Nolon Nations M.D.   On: 07/15/2017 13:50    Review of Systems  All other systems reviewed and are negative.   Blood pressure 129/73, pulse 93, temperature 98.2 F (36.8 C), temperature source Oral, resp. rate 18, weight 91.6 kg (202 lb), SpO2 100 %. Physical Exam  Vitals  Weight: 198.31 lb Height: 64in Body Surface Area: 1.95 m Body Mass Index: 34.04 kg/m  Temp.: 98.53F  Pulse: 90 (Regular)  P.OX: 97% (Room air) Physical Exam  General Mental Status-Alert. Orientation-Oriented X3. Breast Nipples-No Discharge. Breast Lump-No Palpable Breast Mass. Lymphatic Head & Neck General Head & Neck Lymphatics: Bilateral - Description - Normal. Axillary General Axillary Region: Bilateral - Description - Normal. cv rrr Lungs clear  Assessment/Plan  Assessment & Plan [name] MD; 07/03/2017 1:13 PM) CARCINOMA OF LEFT BREAST UPPER INNER QUADRANT (Z61.096) Story: Bilateral skin sparing mastectomy, left TAD, port removal MRI shows good response. plan for bilateral ssm with immediate prepec reconstruction with plastic surgery. discussed ssm again today. I think reasonable to do TAD on the left. we discussed seed guided node excision as well as sentinel node biopsy. if any of these nodes are positive on final path she will need to return to or for completion alnd    [name], MD 07/16/2017, 7:14 AM

## 2017-07-16 NOTE — Interval H&P Note (Signed)
History and Physical Interval Note:  07/16/2017 7:15 AM  [name]  has presented today for surgery, with the diagnosis of BREAST CANCER, BRCA POSITIVE  The various methods of treatment have been discussed with the patient and family. After consideration of risks, benefits and other options for treatment, the patient has consented to  Procedure(s): LEFT SKIN SPARING MASTECTOMY WITH LEFT RADIOACTIVE SEED TARGETED AXILLARY LYMPH NODE EXCISION AND AXILLARY SENTINEL LYMPH NODE BIOPSY (Left) RIGHT PROPHYLACTIC SKIN SPARING MASTECTOMY (Right) REMOVAL PORT-A-CATH (N/A) BILATERAL BREAST RECONSTRUCTION WITH PLACEMENT OF TISSUE EXPANDER AND ALLODERM (Bilateral) as a surgical intervention .  The patient's history has been reviewed, patient examined, no change in status, stable for surgery.  I have reviewed the patient's chart and labs.  Questions were answered to the patient's satisfaction.     [name]

## 2017-07-16 NOTE — Anesthesia Postprocedure Evaluation (Signed)
Anesthesia Post Note  Patient: Kathryn Stanley  Procedure(s) Performed: LEFT SKIN SPARING MASTECTOMY WITH LEFT RADIOACTIVE SEED TARGETED AXILLARY LYMPH NODE EXCISION AND AXILLARY SENTINEL LYMPH NODE BIOPSY (Left Breast) RIGHT PROPHYLACTIC SKIN SPARING MASTECTOMY (Right Breast) REMOVAL PORT-A-CATH (N/A Chest) BILATERAL BREAST RECONSTRUCTION WITH PLACEMENT OF TISSUE EXPANDER AND ALLODERM (Bilateral Breast)     Patient location during evaluation: PACU Anesthesia Type: General Level of consciousness: awake and sedated Pain management: pain level controlled Vital Signs Assessment: post-procedure vital signs reviewed and stable Respiratory status: spontaneous breathing Cardiovascular status: stable Anesthetic complications: yes Anesthetic complication details: PONVComments: Treated with one dose of phenergan    Last Vitals:  Vitals:   07/16/17 1247 07/16/17 1302  BP: 123/71 122/72  Pulse: 85 83  Resp: 15 14  Temp:    SpO2: 95% 97%    Last Pain:  Vitals:   07/16/17 1259  TempSrc:   PainSc: Asleep   Pain Goal:                 Rylin Seavey JR,JOHN Aoki Wedemeyer

## 2017-07-16 NOTE — Anesthesia Procedure Notes (Addendum)
Anesthesia Regional Block: Pectoralis block   Pre-Anesthetic Checklist: ,, timeout performed, Correct Patient, Correct Site, Correct Laterality, Correct Procedure, Correct Position, site marked, Risks and benefits discussed,  Surgical consent,  Pre-op evaluation,  At surgeon's request and post-op pain management  Laterality: Left and Upper  Prep: chloraprep       Needles:  Injection technique: Single-shot  Needle Type: Echogenic Stimulator Needle     Needle Length: 9cm  Needle Gauge: 21   Needle insertion depth: 3 cm   Additional Needles:   Procedures:,,,, ultrasound used (permanent image in chart),,,,  Narrative:  Start time: 07/16/2017 7:10 AM End time: 07/16/2017 7:18 AM Injection made incrementally with aspirations every 5 mL.  Performed by: Personally  Anesthesiologist: Lyn Hollingshead, MD

## 2017-07-16 NOTE — Anesthesia Procedure Notes (Addendum)
Anesthesia Regional Block: Pectoralis block   Pre-Anesthetic Checklist: ,, timeout performed, Correct Patient, Correct Site, Correct Laterality, Correct Procedure, Correct Position, site marked, Risks and benefits discussed,  Surgical consent,  Pre-op evaluation,  At surgeon's request and post-op pain management  Laterality: Right and Upper  Prep: chloraprep       Needles:  Injection technique: Single-shot  Needle Type: Echogenic Stimulator Needle     Needle Length: 9cm  Needle Gauge: 21   Needle insertion depth: 2.5 cm   Additional Needles:   Procedures:,,,, ultrasound used (permanent image in chart),,,,  Narrative:  Start time: 07/16/2017 6:59 AM End time: 07/16/2017 7:09 AM Injection made incrementally with aspirations every 5 mL. Anesthesiologist: Lyn Hollingshead, MD

## 2017-07-16 NOTE — Op Note (Addendum)
Operative Note   DATE OF OPERATION: 2.7.19  LOCATION: [facility]  SURGICAL DIVISION: Plastic Surgery  PREOPERATIVE DIAGNOSES:  1.Left breast cancer UOQ ER+ 2.BRCA1 mutation  POSTOPERATIVE DIAGNOSES:  same  PROCEDURE:  1. Bilateral breast reconstruction with tissue expander 2. Acellular dermis (Alloderm) for breast reconstruction 600 cm2  SURGEON: Irene Limbo MD MBA  ASSISTANTThedora Hinders RNFA  ANESTHESIA:  General.   EBL: 50 ml for entire procedure  COMPLICATIONS: None immediate.   INDICATIONS FOR PROCEDURE:  The patient, Kathryn Stanley, is a 42 y.o. female born on 1976-05-23, is here for immediate expander, ADM based reconstruction following skin reduction mastectomies.   FINDINGS: Natrelle 133FV-12-T 400 ml tissue expanders placed bilateral, initial fill volume 300 ml air. [facility] SN [id number] LEFT SN [id number]  DESCRIPTION OF PROCEDURE:  The patient was marked with the patient in the preoperative area to mark sternal notch, chest midline, anterior axillary lines and inframammary folds. Patient was marked for skin reduction mastectomy with most superior portion nipple areola marked on breast meridian. Vertical limbs marked by breast displacement and set at 9 cm length. The patient was taken to the operating room. SCDs were placed and IV antibiotics were given. Foley catheter placed. The patient's operative site was prepped and draped in a sterile fashion. A time out was performed and all information was confirmed to be correct. In supine position, the lateral limbs for resection marked and area over lower pole preserved as inferiorly based dermal pedicle. Skin de epithelialized in this area.Following completion of mastectomies, reconstruction began on right side.  The cavity was irrigated with solution containing polymyxin and bacitracin. Hemostasis was ensured. A 19 Fr drain was placed in subcutaneous position laterally and a 15 Fr drain placed along inframammary fold.  Each secured to skin with 2-0 nylon. Cavity irrigated with Betadine. The tissue expanders were prepared on back table prior in insertion. The expander was filled with air to 300 ml. Acellular dermis was perforated and draped over anterior surface expander. The ADM was then secured to itself over posterior surface of expander. Redundant folds acellular dermis excised so that the ADM lied flat without folds over air filled expander. The expander was secured to medial insertion pectoralis with a 0 vicryl. The lateral tab also secured to pectoralis muscle with 0-vicryl. The ADM was secured to pectoralis muscle and chest wall along inferior border at inframammary fold with 0 V lock suture. Laterally the mastectomy flap over posterior axillary line was advanced anteriorly and the subcutaneous tissue and superficial fascia was secured to pectoralis muscle and acellular dermis with 0-vicryl. The inferiorly based dermal pedicle was redraped superiorly over expander and acellular dermis and secured to pectoralis with interrupted 0-vicryl. Skin closure completedwith 3-0 vicryl in fascial layer and 4-0 vicryl in dermis. Skin closure completed with 4-0 monocryl subcuticular and tissue adhesive.  I then directed my attention to left chest where similar irrigation and drain placement completed. The prepared expander with ADM secured over anterior surface was placed in right chest and tabs secured to chest wall and pectoralis muscle with 0- vicryl suture. The acellular dermis at inframammary fold was secured to chest wall with 0 V-lock suture. Laterally the mastectomy flap over posterior axillary line was advanced anteriorly and the subcutaneous tissue and superficial fascia was secured to pectoralis muscle and acellular dermis with 0-vicryl. The inferiorly based dermal pedicle was redraped superiorly over expander and acellular dermis and secured to pectoralis with interrupted 0-vicryl. Skin closure completedwith 3-0 vicryl  in fascial  layer and 4-0 vicryl in dermis. Skin closure completed with 4-0 monocryl subcuticular and tissue adhesive. Tegaderms applied over each chest.  The patient was allowed to wake from anesthesia, extubated and taken to the recovery room in satisfactory condition.   SPECIMENS: none  DRAINS: 15 and 19 Fr JP in right and left subcutaneous chest  Irene Limbo, MD [facility]astic [facility] [phone], pin [phone]

## 2017-07-17 ENCOUNTER — Encounter (HOSPITAL_COMMUNITY): Payer: Self-pay | Admitting: General Surgery

## 2017-07-17 DIAGNOSIS — C50212 Malignant neoplasm of upper-inner quadrant of left female breast: Secondary | ICD-10-CM | POA: Diagnosis not present

## 2017-07-17 MED ORDER — METHOCARBAMOL 500 MG PO TABS: 500.0000 mg | ORAL_TABLET | Freq: Three times a day (TID) | ORAL | 0 refills | Status: DC | PRN

## 2017-07-17 MED ORDER — SULFAMETHOXAZOLE-TRIMETHOPRIM 800-160 MG PO TABS: 1.0000 | ORAL_TABLET | Freq: Two times a day (BID) | ORAL | 0 refills | Status: DC

## 2017-07-17 MED ORDER — OXYCODONE HCL 5 MG PO TABS: 5.0000 mg | ORAL_TABLET | ORAL | 0 refills | Status: DC | PRN

## 2017-07-17 MED FILL — METHOCARBAMOL 500 MG TABS: 500 | 10 days supply | Qty: 30 | Fill #0

## 2017-07-17 MED FILL — oxyCODONE HCL 5 MG TABS: 5 | 3 days supply | Qty: 40 | Fill #0

## 2017-07-17 MED FILL — SULFAMETHOXAZOLE-TMP DS TAB: 800-160 | 6 days supply | Qty: 12 | Fill #0

## 2017-07-17 NOTE — Progress Notes (Signed)
Pt discharged to home with her husband.  Discharge instructions and prescriptions given. Education for care and logging of JPs given.

## 2017-07-17 NOTE — Discharge Summary (Signed)
Physician Discharge Summary  Patient ID: Kathryn Stanley MRN: [id number] DOB/AGE: 1975/10/31 42 y.o.  Admit date: 07/16/2017 Discharge date: 07/17/2017  Admission Diagnoses: BRCA1, left breast cancer  Discharge Diagnoses:  Same  Discharged Condition: stable  Hospital Course: Post operatively patient did well and was independent in ambulation, OOB on DOS. Family instructed on drain care. She tolerated oral medication for pain.  Treatments: surgery: bilateral skin reduction mastectomies with immediate expander acellular dermis reconstruction, removal port, left SLN  Discharge Exam: Blood pressure 133/67, pulse 88, temperature 97.7 F (36.5 C), temperature source Oral, resp. rate 17, height 5' 4"  (1.626 m), weight 92.5 kg (203 lb 14.8 oz), SpO2 98 %. Incision/Wound: chest soft drains serosanguinous Tegaderms in place  Disposition: 01-Home or Self Care  Discharge Instructions    Call MD for:  redness, tenderness, or signs of infection (pain, swelling, bleeding, redness, odor or green/yellow discharge around incision site)   Complete by:  As directed    Call MD for:  temperature >100.5   Complete by:  As directed    Discharge instructions   Complete by:  As directed    Ok to remove dressings and shower am 2.9.19. Soap and water ok, pat Tegaderms dry. No not remove Tegaderms. No creams or ointments over incisions. Do not let drains dangle in shower, attach to lanyard or similar.Strip and record drains twice daily and bring log to clinic visit.  Breast binder or soft compression bra all other times.  Ok to raise arms above shoulders for bathing and dressing.  No house yard work or exercise until cleared by MD.   Recommend ibuprofen with meals as directed to aid with pain control. Recommend Miralax or dulcolax as needed for constipation.   Driving Restrictions   Complete by:  As directed    No driving for 2 weeks, then no driving if taking narcotics   Lifting restrictions   Complete  by:  As directed    No lifting > 5 lbs until cleared by MD   Resume previous diet   Complete by:  As directed      Allergies as of 07/17/2017      Reactions   Amoxicillin Anaphylaxis, Rash   PATIENT HAS HAD A PCN REACTION WITH IMMEDIATE RASH, FACIAL/TONGUE/THROAT SWELLING, SOB, OR LIGHTHEADEDNESS WITH HYPOTENSION:  #  #  #  YES  #  #  #   Has patient had a PCN reaction causing severe rash involving mucus membranes or skin necrosis: No Has patient had a PCN reaction that required hospitalization: No Has patient had a PCN reaction occurring within the last 10 years: No If all of the above answers are "NO", then may proceed with Cephalosporin use.   Chlorhexidine Gluconate Rash   Nicotine Other (See Comments)   Patch caused soreness at application site      Medication List    TAKE these medications   famotidine 10 MG tablet Commonly known as:  PEPCID Take 10 mg by mouth daily as needed for heartburn or indigestion.   furosemide 20 MG tablet Commonly known as:  LASIX Take 1 tablet (20 mg total) by mouth daily. Take for 3 days   ibuprofen 200 MG tablet Commonly known as:  ADVIL,MOTRIN Take 200-400 mg by mouth daily as needed for headache or moderate pain.   methocarbamol 500 MG tablet Commonly known as:  ROBAXIN Take 1 tablet (500 mg total) by mouth every 8 (eight) hours as needed for muscle spasms.   oxyCODONE 5 MG  immediate release tablet Commonly known as:  Oxy IR/ROXICODONE Take 1-2 tablets (5-10 mg total) by mouth every 4 (four) hours as needed for moderate pain.   potassium chloride SA 20 MEQ tablet Commonly known as:  K-DUR,KLOR-CON Take 1 tablet (20 mEq total) by mouth daily. Take once daily with lasix   sulfamethoxazole-trimethoprim 800-160 MG tablet Commonly known as:  BACTRIM DS,SEPTRA DS Take 1 tablet by mouth 2 (two) times daily.      Follow-up Information    [name], MD Follow up in 2 week(s).   Specialty:  General Surgery Contact  information: [address] [place] [address] [phone]        Irene Limbo, MD Follow up in 1 week(s).   Specialty:  Plastic Surgery Why:  as scheduled Contact information: [address] [place] [address] [phone]           Signed: Irene Limbo 07/17/2017, 7:03 AM

## 2017-07-17 NOTE — Progress Notes (Signed)
Patient ID: Kathryn Stanley, female   DOB: [birth year], 42 y.o.   MRN: [id number] Doing fine, drains as expected Will call with path next week Follow up 2 weeks Dc home

## 2017-07-22 ENCOUNTER — Other Ambulatory Visit: Payer: Self-pay | Admitting: *Deleted

## 2017-07-22 DIAGNOSIS — Z170 Estrogen receptor positive status [ER+]: Principal | ICD-10-CM

## 2017-07-22 DIAGNOSIS — C50412 Malignant neoplasm of upper-outer quadrant of left female breast: Secondary | ICD-10-CM

## 2017-07-23 ENCOUNTER — Encounter: Payer: Self-pay | Admitting: Radiation Oncology

## 2017-07-27 NOTE — Progress Notes (Signed)
Location of Breast Cancer:Upper-outer quadrant of left breast in female   Histology per Pathology Report:  Diagnosis 12-29-16 1. Breast, left, needle core biopsy, stereotactic, upper inner quadrant - FIBROCYSTIC CHANGES INCLUDING APOCRINE METAPLASIA WITH CALCIFICATIONS - NO MALIGNANCY IDENTIFIED 2. Breast, left, needle core biopsy, ultrasound, 1:30 o'clock - INVASIVE MAMMARY CARCINOMA - SEE COMMENT 3. Lymph node, needle/core biopsy, ultrasound, left axillary - METASTIC CARCINOMA INVOLVING ONE LYMPH NODE (1/1)  Receptor Status: ER(15 % + ), PR (0 % -), Her2-neu (-), Ki-(10 %)  Did patient present with symptoms (if so, please note symptoms) or was this found on screening mammography?: She felt the lump herself 3 months ago. She felt pain like "stabbing" first. She then had a mammogram 12/25/16 and subsequent biopsy.  Past/Anticipated interventions by surgeon, if any:  4. PROGNOSTIC INDICATORS     ER ( 0 % -), PR ( 0% -)  4. FLUORESCENCE IN-SITU HYBRIDIZATION Results: HER2 - NEGATIVE  Diagnosis 07-16-17 Dr. [name] 1. Breast, simple mastectomy, Right Prophylactic - FIBROCYSTIC CHANGES INCLUDING APOCRINE METAPLASIA - DUCT ECTASIA - CALCIFICATIONS - NO MALIGNANCY IDENTIFIED 2. Lymph node, sentinel, biopsy, Left Axillary - NO CARCINOMA IDENTIFIED IN ONE LYMPH NODE (0/1) - SEE COMMENT 3. Lymph node, sentinel, biopsy, Left - NO CARCINOMA IDENTIFIED IN ONE LYMPH NODE (0/1) - SEE COMMENT 4. Breast, simple mastectomy, Left - INVASIVE DUCTAL CARCINOMA, NOTTINGHAM GRADE 2 OF 3, 1.3 CM - MARGINS UNINVOLVED BY CARCINOMA (2 CM POSTERIOR MARGIN) - PREVIOUS BIOPSY SITE CHANGES - CALCIFICATIONS  01-14-17 Port-A-Cath Placement Dr. Donne Hazel  Receptor Status: ER(15 % + ), PR (0 % -), Her2-neu ( - Ratio 1.63), Ki-(10 %)   Past/Anticipated interventions by medical oncology, if any:   06-23-17  Dr. Burr Medico  adjuvant Xeloda may need to take has not started will see Dr.Feng  08-03-17  06-23-17  MR BREAST BILATERAL WO CONTRAST 06-12-17 IMPRESSION: 1. Significant positive response to neoadjuvant chemotherapy. The index carcinoma in the left breast has significantly decreased in size. Left axillary adenopathy has also significantly improved. 2. No other evidence of malignancy.  03-05-17 Genetic testing positivie result  Chemotherapy  yes   01/16/2017 - 06/05/2017 Chemotherapy     Neoadjuvant chemo AC every 2 weeks for 4 cycles starting on 01/16/2017 and ended 02/27/17. Then proceed with weekly taxol for 12 treatments starting 03/13/17. Added Carboplatin AUC 2 on 04/03/17 to weekly taxol. Due to symptomatic thrombocytopenia we held Carbo since cycle 8. Cycle 9 was post-poned due to neutropenia and Granix was added starting 05/12/17. Added carbo back at reduced dose AUC 1.5 with cycle 10 on 05/22/17. Plan to complete weekly CT on 06/05/17    06-05-17 After surgery adjuvant radiation then anti-estrogen therapy Tamoxifen  03-05-17 Genetic testing positivie result 01-15-17 CT chest w contrast 01-15-17 CT abdomen pelvis w contrast 01-15-17 Bone scan whole body  Lymphedema issues, if any:  No Rom to leftt and right arm good. Skin to right and left breast healing well has JP drains yesterday she has a toatal of 48 cc.  May get the JP drains out today will see Dr. Iran Planas ; has a  follow up appointment with Dr. [name] Friday, 07-31-17  Pain issues, if any:3/10 taking Ibuprofen  SAFETY ISSUES:  Prior radiation? : No  Pacemaker/ICD? : No  Possible current pregnancy? : No  Is the patient on methotrexate? : No   Menarche 12  G2 P1 twins   BC No      HRT No  Current Complaints / other  details:    Wt Readings from Last 3 Encounters:  07/29/17 199 lb 8 oz (90.5 kg)  07/29/17 199 lb 8 oz (90.5 kg)  07/16/17 203 lb 14.8 oz (92.5 kg)  BP 117/85 (BP Location: Right Arm, Patient Position: Sitting, Cuff Size: Normal)   Pulse 77   Temp 97.9 F (36.6 C)  (Oral)   Resp 18   Ht 5' 4"  (1.626 m)   Wt 199 lb 8 oz (90.5 kg)   LMP 01/07/2017   SpO2 100%   BMI 34.24 kg/m   Georgena Spurling, RN 07/27/2017,2:33 PM

## 2017-07-29 ENCOUNTER — Other Ambulatory Visit: Payer: Self-pay

## 2017-07-29 ENCOUNTER — Inpatient Hospital Stay: Payer: Medicaid Other

## 2017-07-29 ENCOUNTER — Ambulatory Visit
Admission: RE | Admit: 2017-07-29 | Discharge: 2017-07-29 | Disposition: A | Discharge status: Home / Self Care | Payer: Medicaid Other | Source: Ambulatory Visit | Attending: Radiation Oncology | Admitting: Radiation Oncology

## 2017-07-29 ENCOUNTER — Encounter: Payer: Self-pay | Admitting: Gynecologic Oncology

## 2017-07-29 ENCOUNTER — Inpatient Hospital Stay: Payer: Medicaid Other | Attending: Hematology | Admitting: Gynecologic Oncology

## 2017-07-29 ENCOUNTER — Encounter: Payer: Self-pay | Admitting: Radiation Oncology

## 2017-07-29 VITALS — BP 117/85 | HR 77 | Temp 97.9°F | Resp 18 | Wt 199.5 lb

## 2017-07-29 VITALS — BP 117/85 | HR 77 | Temp 97.9°F | Resp 18 | Ht 64.0 in | Wt 199.5 lb

## 2017-07-29 DIAGNOSIS — C50412 Malignant neoplasm of upper-outer quadrant of left female breast: Secondary | ICD-10-CM | POA: Diagnosis not present

## 2017-07-29 DIAGNOSIS — Z9221 Personal history of antineoplastic chemotherapy: Secondary | ICD-10-CM | POA: Insufficient documentation

## 2017-07-29 DIAGNOSIS — Z171 Estrogen receptor negative status [ER-]: Secondary | ICD-10-CM | POA: Insufficient documentation

## 2017-07-29 DIAGNOSIS — Z170 Estrogen receptor positive status [ER+]: Secondary | ICD-10-CM

## 2017-07-29 DIAGNOSIS — Z79899 Other long term (current) drug therapy: Secondary | ICD-10-CM | POA: Insufficient documentation

## 2017-07-29 DIAGNOSIS — G893 Neoplasm related pain (acute) (chronic): Secondary | ICD-10-CM | POA: Diagnosis not present

## 2017-07-29 DIAGNOSIS — Z87891 Personal history of nicotine dependence: Secondary | ICD-10-CM | POA: Diagnosis not present

## 2017-07-29 DIAGNOSIS — R948 Abnormal results of function studies of other organs and systems: Secondary | ICD-10-CM | POA: Diagnosis not present

## 2017-07-29 DIAGNOSIS — Z1509 Genetic susceptibility to other malignant neoplasm: Secondary | ICD-10-CM | POA: Diagnosis not present

## 2017-07-29 DIAGNOSIS — K219 Gastro-esophageal reflux disease without esophagitis: Secondary | ICD-10-CM | POA: Insufficient documentation

## 2017-07-29 DIAGNOSIS — Z9013 Acquired absence of bilateral breasts and nipples: Secondary | ICD-10-CM | POA: Insufficient documentation

## 2017-07-29 DIAGNOSIS — Z9012 Acquired absence of left breast and nipple: Secondary | ICD-10-CM | POA: Insufficient documentation

## 2017-07-29 DIAGNOSIS — C773 Secondary and unspecified malignant neoplasm of axilla and upper limb lymph nodes: Secondary | ICD-10-CM | POA: Insufficient documentation

## 2017-07-29 DIAGNOSIS — Z1501 Genetic susceptibility to malignant neoplasm of breast: Secondary | ICD-10-CM | POA: Diagnosis not present

## 2017-07-29 NOTE — Patient Instructions (Addendum)
We will plan for surgery around the end of April (holding April 22).  We will see you in the office at the completion of your radiation.

## 2017-07-29 NOTE — Progress Notes (Signed)
Radiation Oncology         [phone]) [phone] ________________________________  Name: Kathryn Stanley MRN: [id number]  Date: 07/29/2017  DOB: 1976-03-16  RC:VELFYB, [name], MD  [name], MD     REFERRING PHYSICIAN: [name], MD   DIAGNOSIS: The encounter diagnosis was Malignant neoplasm of upper-outer quadrant of left breast in female, estrogen receptor positive ([place]).   HISTORY OF PRESENT ILLNESS: Kathryn Stanley is a 42 y.o. female originally seen in the multidisciplinary breast conference for a new diagnosis of left breast cancer. The patient noted a palpable mass in the left breast for about 3 months. She proceeded with diagnostic mammography and ultrasound which revealed a 3 x 2.3 x 1.6 cm mass in the posterior breast as well as some anterior calcifications as well as an enlarged node measuring 2.2 cm in the left axilla. A biopsy of the calcifications was negative for malignancy and concordant by pathology. The mass and axillary node were biopsied as well on 12/29/16 which revealed a grade 3, invasive ductal carcinoma. ER was weakly positive at 15%, PR was negative, HER2 was negative and Ki 67 was 10%. 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 She had improvement noted in the axilla and the primary tumor on post treatment imaging.   She underwent bilateral mastectomies with tissue expander placement and targeted axillary dissection on 07/16/17, which revealed a 1.3 cm grade 2 invasive ductal carcinoma, and of the 2 nodes sampled none contained disease, and her margins were negative. She comes today to discuss adjuvant radiotherapy.  She met with Dr. Denman George today and is planning robotic hyst/BSO for risk reduction.  PREVIOUS RADIATION THERAPY: No   PAST MEDICAL HISTORY:  Past Medical History:  Diagnosis Date  . Breast cancer, left breast ([place]) dx'd 01/2017  . Dermatologic problem    possible psoriasis. Pt has  not had been diagnosed by dermatology.  . 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 GERD (gastroesophageal reflux disease)   . Tobacco dependence    trying to quit with nicotene patches       PAST SURGICAL HISTORY: Past Surgical History:  Procedure Laterality Date  . BREAST BIOPSY Left 12/2016  . BREAST RECONSTRUCTION Bilateral 07/16/2017   BILATERAL BREAST RECONSTRUCTION WITH PLACEMENT OF TISSUE EXPANDER AND ALLODERM/notes 07/16/2017  . CESAREAN SECTION  2002  . MASTECTOMY Bilateral 07/16/2017   LEFT SKIN SPARING MASTECTOMY WITH LEFT RADIOACTIVE SEED TARGETED AXILLARY LYMPH NODE EXCISION AND AXILLARY SENTINEL LYMPH NODE BIOPSY; RIGHT PROPHYLACTIC SKIN SPARING MASTECTOMY [name] 07/16/2017  . MASTECTOMY WITH RADIOACTIVE SEED GUIDED EXCISION AND AXILLARY SENTINEL LYMPH NODE BIOPSY Left 07/16/2017   Procedure: LEFT SKIN SPARING MASTECTOMY WITH LEFT RADIOACTIVE SEED TARGETED AXILLARY LYMPH NODE EXCISION AND AXILLARY SENTINEL LYMPH NODE BIOPSY;  Surgeon: [name], MD;  Location: [place];  Service: General;  Laterality: Left;  . NIPPLE SPARING MASTECTOMY Right 07/16/2017   Procedure: RIGHT PROPHYLACTIC SKIN SPARING MASTECTOMY;  Surgeon: [name], MD;  Location: [place];  Service: General;  Laterality: Right;  . PORT-A-CATH REMOVAL N/A 07/16/2017   Procedure: REMOVAL PORT-A-CATH;  Surgeon: [name], MD;  Location: [place];  Service: General;  Laterality: N/A;  . PORTA CATH REMOVAL  07/16/2017  . PORTACATH PLACEMENT N/A 01/14/2017   Procedure: INSERTION PORT-A-CATH WITH [place];  Surgeon: [name], MD;  Location: [place];  Service: General;  Laterality:  N/A;     FAMILY HISTORY:  Family History  Problem Relation Age of Onset  . Stroke Maternal Grandmother   . Stroke Father   . Hypertension Father   . Other Father        Adopted     SOCIAL HISTORY:  reports that she quit smoking about 7 months ago.  Her smoking use included cigarettes. She has a 17.00 pack-year smoking history. she has never used smokeless tobacco. She reports that she does not drink alcohol or use drugs. The patient is married. She works for [employer].    ALLERGIES: Amoxicillin; Chlorhexidine gluconate; and Nicotine   MEDICATIONS:  Current Outpatient Medications  Medication Sig Dispense Refill  . famotidine (PEPCID) 10 MG tablet Take 10 mg by mouth daily as needed for heartburn or indigestion.    Marland Kitchen ibuprofen (ADVIL,MOTRIN) 200 MG tablet Take 200-400 mg by mouth daily as needed for headache or moderate pain.     No current facility-administered medications for this encounter.      REVIEW OF SYSTEMS: On review of systems, the patient reports that she is doing great since chemotherapy and surgery. She reports she is ready to have her drains removed today. Otherwise, she is doing well overall. She denies any chest pain, shortness of breath, cough, fevers, chills, night sweats, unintended weight changes. She denies any bowel or bladder disturbances, and denies abdominal pain, nausea or vomiting. She denies any new musculoskeletal or joint aches or pains, new skin lesions or concerns. A complete review of systems is obtained and is otherwise negative.      PHYSICAL EXAM:  Wt Readings from Last 3 Encounters:  07/29/17 199 lb 8 oz (90.5 kg)  07/29/17 199 lb 8 oz (90.5 kg)  07/16/17 203 lb 14.8 oz (92.5 kg)   Temp Readings from Last 3 Encounters:  07/29/17 97.9 F (36.6 C) (Oral)  07/29/17 97.9 F (36.6 C) (Oral)  07/17/17 97.7 F (36.5 C) (Oral)   BP Readings from Last 3 Encounters:  07/29/17 117/85  07/29/17 117/85  07/17/17 133/67   Pulse Readings from Last 3 Encounters:  07/29/17 77  07/29/17 77  07/17/17 88    In general this is a well appearing caucasian female in no acute distress. She is alert and oriented x4 and appropriate throughout the examination. HEENT reveals that the patient is  normocephalic, atraumatic. EOMs are intact. PERRLA. Cardiopulmonary assessment is negative for acute distress and she exhibits normal effort. Bilateral breasts have pos surgical change and one JP on either side. Her expanders are intact. The incision sites are intact, there is mild concern for necrosis of the lower aspect of her right breast incision. No edema is noted of the left chest wall or LUE.   ECOG = 0  0 - Asymptomatic (Fully active, able to carry on all predisease activities without restriction)  1 - Symptomatic but completely ambulatory (Restricted in physically strenuous activity but ambulatory and able to carry out work of a light or sedentary nature. For example, light housework, office work)  2 - Symptomatic, <50% in bed during the day (Ambulatory and capable of all self care but unable to carry out any work activities. Up and about more than 50% of waking hours)  3 - Symptomatic, >50% in bed, but not bedbound (Capable of only limited self-care, confined to bed or chair 50% or more of waking hours)  4 - Bedbound (Completely disabled. Cannot carry on any self-care. Totally confined to bed or chair)  5 -  Death   Eustace Pen MM, Creech RH, Tormey DC, et al. [phone]). "Toxicity and response criteria of the [facility]". [place] Oncol. 5 (6): 649-55    LABORATORY DATA:  Lab Results  Component Value Date   WBC 4.8 07/09/2017   HGB 13.1 07/09/2017   HCT 38.9 07/09/2017   MCV 89.0 07/09/2017   PLT 193 07/09/2017   Lab Results  Component Value Date   NA 139 06/23/2017   K 3.7 06/23/2017   CL 107 06/23/2017   CO2 24 06/23/2017   Lab Results  Component Value Date   ALT 90 (H) 06/23/2017   AST 54 (H) 06/23/2017   ALKPHOS 71 06/23/2017   BILITOT 0.4 06/23/2017      RADIOGRAPHY: Nm Sentinel Node Inj-no Rpt (breast)  Result Date: 07/16/2017 Sulfur colloid was injected by the nuclear medicine technologist for melanoma sentinel node.   Mm Breast  Surgical Specimen  Addendum Date: 07/16/2017   ADDENDUM REPORT: 07/16/2017 09:37 ADDENDUM: An additional image was acquired of the same specimen within the grid, and the locations of both the seed and the clip were conveyed to the OR at 9:36 a.m. Electronically Signed   By: Ammie Ferrier M.D.   On: 07/16/2017 09:37   Result Date: 07/16/2017 CLINICAL DATA:  Specimen radiograph status post lymph node dissection. EXAM: SPECIMEN RADIOGRAPH OF THE LEFT BREAST COMPARISON:  Previous exam(s). FINDINGS: Status post excision of the left breast. The radioactive seed and biopsy marker clip are present and completely intact. These findings were communicated with the OR at 10:15 a.m. IMPRESSION: Specimen radiograph of the left breast. Electronically Signed: By: Ammie Ferrier M.D. On: 07/16/2017 09:24   [place] Lt Radioactive Seed Loc  Result Date: 07/15/2017 CLINICAL DATA:  Patient presents for seed localization of a metastatic left axillary lymph node prior to bilateral mastectomy for known left breast cancer and metastatic disease to the axilla. EXAM: ULTRASOUND GUIDED RADIOACTIVE SEED LOCALIZATION OF THE LEFT BREAST COMPARISON:  Previous exam(s). FINDINGS: Patient presents for radioactive seed localization prior to targeted axillary node dissection. I met with the patient and we discussed the procedure of seed localization including benefits and alternatives. We discussed the high likelihood of a successful procedure. We discussed the risks of the procedure including infection, bleeding, tissue injury and further surgery. We discussed the low dose of radioactivity involved in the procedure. Informed, written consent was given. The usual time-out protocol was performed immediately prior to the procedure. Using ultrasound guidance, sterile technique, 1% lidocaine and an I-125 radioactive seed, lymph node associated with a spiral shaped clip in the left axilla was localized using a lateral approach. The follow-up  mammogram images confirm the seed in the expected location and were marked for Dr. Donne Hazel. Follow-up survey of the patient confirms presence of the radioactive seed. Order number of I-125 seed:  [id number]. Total activity:  6.283 millicuries reference Date: 07/07/2017 The patient tolerated the procedure well and was released from the [place]. She was given instructions regarding seed removal. IMPRESSION: Radioactive seed localization of left axillary lymph node. No apparent complications. Electronically Signed   By: Nolon Nations M.D.   On: 07/15/2017 13:45   Mm Clip Placement Left  Result Date: 07/15/2017 CLINICAL DATA:  Status post placement of seed in the left axilla prior to targeted axillary node dissection for known metastatic left breast cancer. EXAM: DIAGNOSTIC LEFT MAMMOGRAM POST ULTRASOUND-GUIDED RADIOACTIVE SEED PLACEMENT COMPARISON:  Previous exam(s). FINDINGS: Mammographic images were obtained following ultrasound-guided radioactive seed  placement. These demonstrate radioactive seed adjacent to a spiral shaped clip in the left axilla. IMPRESSION: Appropriate location of the radioactive seed. Final Assessment: Post Procedure Mammograms for Seed Placement Electronically Signed   By: Nolon Nations M.D.   On: 07/15/2017 13:50       IMPRESSION/PLAN: 1. 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 The patient's pathology and coures have been reviewed. She initially had disease within the axillary node as well and though she's had a pathologic response in the nodes, and improvement in the breast, she was counseled on the role of radiotherapy to the chest wall and regional nodes. We discussed the risks, benefits, short, and long term effects of radiotherapy, and the patient is interested in proceeding. Dr. Lisbeth Rish discusses the delivery and logistics of radiotherapy and would recommend a course of 6 1/2 weeks of treatment to  the breast and regional nodes and would proceed with deep inspiration breath hold technique for simulation and treatment. Written consent is obtained and placed in the chart, a copy was provided to the patient. She will simulate next week and be called by our staff to coordinate this.  2. Research project for understanding the patient experience.  In our department we are currently looking at a better way to understand patient experience for those undergoing radiotherapy.  She was counseled on the opportunity to participate in this project, after discussing the logistics of this, the patient is interested. I will let our simulation staff know her interest.  3. BRCA positive. The patient will undergo robotic hysterectomy/BSO in the future. She is hoping to schedule this in May after completion of her radiotherapy and after an event that's been planned for her. 4 Contraception. The patient is cautioned regarding avoiding pregnancy. She will be having hysterectomy with BSO. 5. Reconstruction. The patient will proceed with postop check with Dr. Iran Planas today and will discuss further plans for exchange. I believe she would have this procedure done about 6 months later.  In a visit lasting 45 minutes, greater than 50% of the time was spent face to face discussing her case, and coordinating the patient's care.   The above documentation reflects my direct findings during this shared patient visit. Please see the separate note by Dr. Lisbeth Codd on this date for the remainder of the patient's plan of care.    Carola Rhine, PAC

## 2017-07-29 NOTE — Progress Notes (Signed)
Consult Note: Gyn-Onc  Consult was requested by Dr. Burr Medico for the evaluation of Kathryn Stanley 42 y.o. female  CC:  Chief Complaint  Patient presents with  . Malignant neoplasm of upper-outer quadrant of left breast in    Assessment/Plan:  Kathryn Stanley  is a 42 y.o.  year old with BRCA 1 germline mutation and left breast cancer.  She is completing therapy for her breast cancer including radiation. When this is complete, she will be a good candidate for risk reducing surgery with robotic assisted total hysterectomy, BSO. I discussed that postoperatively she will experience surgical menopause which will not be able to be treated with hormonal remedies. She understands the risks of surgery including  bleeding, infection, damage to internal organs (such as bladder,ureters, bowels), blood clot, reoperation and rehospitalization. She was informed of expected postop recovery.   Surgery has been tentatively scheduled for late April after she has completed radiation. In the interim we will obtain CA 125 and pelvic US.   HPI: Kathryn Stanley is a 42 year old parous woman who is seen in consultation at the request of Dr Burr Medico for BRCA 1 deleterious mutation.  The patient reports that she was diagnosed with left breast cancer in July, 2018. It is weakly ER positive. She underwent bilateral mastectomies (after a BRCA 1 germline mutation was detected, in addition to adjuvant chemotherapy. She will commence left chest wall radiation in February, 2019.  The patient has no family history significant for breast/ovarian cancer. She has been pregnant once with twins and had a cesarean section. She has had no other abdominal surgeries. She has completed childbearing. Her reconstruction will be with expanders and implants.   Current Meds:  Outpatient Encounter Medications as of 07/29/2017  Medication Sig  . famotidine (PEPCID) 10 MG tablet Take 10 mg by mouth daily as needed for heartburn or  indigestion.  Marland Kitchen ibuprofen (ADVIL,MOTRIN) 200 MG tablet Take 200-400 mg by mouth daily as needed for headache or moderate pain.  . [DISCONTINUED] furosemide (LASIX) 20 MG tablet Take 1 tablet (20 mg total) by mouth daily. Take for 3 days (Patient not taking: Reported on 05/22/2017)  . [DISCONTINUED] methocarbamol (ROBAXIN) 500 MG tablet Take 1 tablet (500 mg total) by mouth every 8 (eight) hours as needed for muscle spasms.  . [DISCONTINUED] oxyCODONE (OXY IR/ROXICODONE) 5 MG immediate release tablet Take 1-2 tablets (5-10 mg total) by mouth every 4 (four) hours as needed for moderate pain.  . [DISCONTINUED] potassium chloride SA (K-DUR,KLOR-CON) 20 MEQ tablet Take 1 tablet (20 mEq total) by mouth daily. Take once daily with lasix (Patient not taking: Reported on 05/22/2017)  . [DISCONTINUED] sulfamethoxazole-trimethoprim (BACTRIM DS,SEPTRA DS) 800-160 MG tablet Take 1 tablet by mouth 2 (two) times daily.   No facility-administered encounter medications on file as of 07/29/2017.     Allergy:  Allergies  Allergen Reactions  . Amoxicillin Anaphylaxis and Rash    PATIENT HAS HAD A PCN REACTION WITH IMMEDIATE RASH, FACIAL/TONGUE/THROAT SWELLING, SOB, OR LIGHTHEADEDNESS WITH HYPOTENSION:  #  #  #  YES  #  #  #   Has patient had a PCN reaction causing severe rash involving mucus membranes or skin necrosis: No Has patient had a PCN reaction that required hospitalization: No Has patient had a PCN reaction occurring within the last 10 years: No If all of the above answers are "NO", then may proceed with Cephalosporin use.   . Chlorhexidine Gluconate Rash  . Nicotine Other (See Comments)  Patch caused soreness at application site    Social Hx:   Social History   Socioeconomic History  . Marital status: Married    Spouse name: Not on file  . Number of children: Not on file  . Years of education: Not on file  . Highest education level: Not on file  Social Needs  . Financial resource strain:  Not on file  . Food insecurity - worry: Not on file  . Food insecurity - inability: Not on file  . Transportation needs - medical: Not on file  . Transportation needs - non-medical: Not on file  Occupational History  . Not on file  Tobacco Use  . Smoking status: Former Smoker    Packs/day: 1.00    Years: 17.00    Pack years: 17.00    Types: Cigarettes    Last attempt to quit: 12/15/2016    Years since quitting: 0.6  . Smokeless tobacco: Never Used  Substance and Sexual Activity  . Alcohol use: No  . Drug use: No  . Sexual activity: Yes    Birth control/protection: None  Other Topics Concern  . Not on file  Social History Narrative  . Not on file    Past Surgical Hx:  Past Surgical History:  Procedure Laterality Date  . BREAST BIOPSY Left 12/2016  . BREAST RECONSTRUCTION Bilateral 07/16/2017   BILATERAL BREAST RECONSTRUCTION WITH PLACEMENT OF TISSUE EXPANDER AND ALLODERM/notes 07/16/2017  . CESAREAN SECTION  2002  . MASTECTOMY Bilateral 07/16/2017   LEFT SKIN SPARING MASTECTOMY WITH LEFT RADIOACTIVE SEED TARGETED AXILLARY LYMPH NODE EXCISION AND AXILLARY SENTINEL LYMPH NODE BIOPSY; RIGHT PROPHYLACTIC SKIN SPARING MASTECTOMY [name] 07/16/2017  . MASTECTOMY WITH RADIOACTIVE SEED GUIDED EXCISION AND AXILLARY SENTINEL LYMPH NODE BIOPSY Left 07/16/2017   Procedure: LEFT SKIN SPARING MASTECTOMY WITH LEFT RADIOACTIVE SEED TARGETED AXILLARY LYMPH NODE EXCISION AND AXILLARY SENTINEL LYMPH NODE BIOPSY;  Surgeon: [name], MD;  Location: [place];  Service: General;  Laterality: Left;  . NIPPLE SPARING MASTECTOMY Right 07/16/2017   Procedure: RIGHT PROPHYLACTIC SKIN SPARING MASTECTOMY;  Surgeon: [name], MD;  Location: [place];  Service: General;  Laterality: Right;  . PORT-A-CATH REMOVAL N/A 07/16/2017   Procedure: REMOVAL PORT-A-CATH;  Surgeon: [name], MD;  Location: [facility];  Service: General;  Laterality: N/A;  . PORTA CATH REMOVAL  07/16/2017  . PORTACATH PLACEMENT  N/A 01/14/2017   Procedure: INSERTION PORT-A-CATH WITH [place];  Surgeon: [name], MD;  Location: [place];  Service: General;  Laterality: N/A;    Past Medical Hx:  Past Medical History:  Diagnosis Date  . Breast cancer, left breast ([place]) dx'd 01/2017  . Dermatologic problem    possible psoriasis. Pt has not had been diagnosed by dermatology.  . 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 GERD (gastroesophageal reflux disease)   . Tobacco dependence    trying to quit with nicotene patches    Past Gynecological History:  C/s x 1 No LMP recorded.  Family Hx:  Family History  Problem Relation Age of Onset  . Stroke Maternal Grandmother   . Stroke Father   . Hypertension Father   . Other Father        Adopted    Review of Systems:  Constitutional  Feels well,    ENT Normal appearing ears and nares bilaterally Skin/Breast  No rash, sores, jaundice, itching, dryness Cardiovascular  No chest pain, shortness of breath, or  edema  Pulmonary  No cough or wheeze.  Gastro Intestinal  No nausea, vomitting, or diarrhoea. No bright red blood per rectum, no abdominal pain, change in bowel movement, or constipation.  Genito Urinary  No frequency, urgency, dysuria, no bleeding (amenorrheic since chemotherapy) Musculo Skeletal  No myalgia, arthralgia, joint swelling or pain  Neurologic  No weakness, numbness, change in gait,  Psychology  No depression, anxiety, insomnia.   Vitals:  Blood pressure 117/85, pulse 77, temperature 97.9 F (36.6 C), temperature source Oral, resp. rate 18, weight 199 lb 8 oz (90.5 kg), SpO2 100 %.  Physical Exam: WD in NAD Neck  Supple NROM, without any enlargements.  Lymph Node Survey No cervical supraclavicular or inguinal adenopathy Cardiovascular  Pulse normal rate, regularity and rhythm. S1 and S2 normal.  Lungs  Clear to auscultation bilateraly, without  wheezes/crackles/rhonchi. Good air movement.  Skin  No rash/lesions/breakdown  Psychiatry  Alert and oriented to person, place, and time  Abdomen  Normoactive bowel sounds, abdomen soft, non-tender and overweight without evidence of hernia.  Back No CVA tenderness Genito Urinary  Vulva/vagina: Normal external female genitalia.   No lesions. No discharge or bleeding.  Bladder/urethra:  No lesions or masses, well supported bladder  Vagina: narrow  Cervix: Normal appearing, no lesions.  Uterus:  Slightly bulky, mobile, no parametrial involvement or nodularity. Retroverted.  Adnexa: no palpable masses. Rectal  deferred Extremities  No bilateral cyanosis, clubbing or edema.   Donaciano Eva, MD  07/29/2017, 5:47 PM

## 2017-07-30 ENCOUNTER — Telehealth: Payer: Self-pay

## 2017-07-30 LAB — CA 125: Cancer Antigen (CA) 125: 13.1 U/mL (ref 0.0–38.1)

## 2017-07-30 NOTE — Telephone Encounter (Signed)
Told Ms Toya that the Ca-125 tumor marker was WNL at 13.1 per Joylene John, NP.

## 2017-07-31 ENCOUNTER — Ambulatory Visit: Payer: Medicaid Other | Admitting: Gynecologic Oncology

## 2017-08-02 NOTE — Progress Notes (Signed)
[facility]  [phone] [phone]  Clinic Follow-up Note   Patient Care Team: [name], MD as PCP - General (Internal Medicine) [name], MD as Consulting Physician (General Surgery) [name], MD as Consulting Physician (Hematology) [name], MD as Consulting Physician (Radiation Oncology)   Date of Service:  08/03/2017  CHIEF COMPLAINTS:  Follow-up for Malignant neoplasm of upper-outer quadrant of left breast in female, estrogen receptor positive   Oncology History   Cancer Staging Malignant neoplasm of upper-outer quadrant of left breast in female, estrogen receptor positive ([place]) Staging form: Breast, AJCC 8th Edition - Clinical stage from 12/29/2016: Stage IIIA (cT2, cN1, cM0, G3, ER: Positive, PR: Negative, HER2: Negative) - Signed by [name], MD on 01/05/2017       Malignant neoplasm of upper-outer quadrant of left breast in female, estrogen receptor positive ([place])   12/25/2016 Mammogram    [place] and [facility] Diagnostic Breast Tomo Bilateral 12/25/16 IMPRESSION: 1. Suspicious mass 2.3 x 1.6 x 3.0 cm  in the 130 o'clock location of the left breast 8cm from the nipple, biopsy recommended. Adjacent simple cyst is 1.9 cm. 2. Suspicious left axillary lymph node 2.2cm for which biopsy is indicated. 3. Indeterminate group of calcifications in the upper central portion of the left breast for which biopsy is recommended.       12/29/2016 Initial Biopsy    Diagnosis 12/29/16 1. Breast, left, needle core biopsy, stereotactic, upper inner quadrant - FIBROCYSTIC CHANGES INCLUDING APOCRINE METAPLASIA WITH CALCIFICATIONS - NO MALIGNANCY IDENTIFIED 2. Breast, left, needle core biopsy, ultrasound, 1:30 o'clock - INVASIVE MAMMARY CARCINOMA, G3 - SEE COMMENT 3. Lymph node, needle/core biopsy, ultrasound, left axillary - METASTIC CARCINOMA INVOLVING ONE LYMPH NODE (1/1) E-cadherin is positive supporting a ductal origin.       12/29/2016  Receptors her2    ER 15%+, weak staining  PR - HER2- Ki67 10%       12/29/2016 Initial Diagnosis    Malignant neoplasm of upper-outer quadrant of left breast in female, estrogen receptor positive ([place])      01/13/2017 Echocardiogram    ECHO 01/13/17 Study Conclusions - Left ventricle: The cavity size was normal. Systolic function was   normal. The estimated ejection fraction was in the range of 60%   to 65%. Wall motion was normal; there were no regional wall   motion abnormalities. Left ventricular diastolic function   parameters were normal. - Mitral valve: There was trivial regurgitation. - Right atrium: The atrium was mildly dilated. - Tricuspid valve: There was trivial regurgitation.       01/14/2017 Surgery    Port placement by Dr. Donne Hazel       01/15/2017 Imaging    Whole Body bone Scan 01/15/17 IMPRESSION: 1.  No significant abnormality identified.      01/15/2017 Imaging    CT CAP W Contrast 01/15/17 IMPRESSION: 1. Left lateral breast mass with pathologic left axillary adenopathy including a 2.0 cm left axillary lymph node. 2. Several hypodense lesions in the liver are likely benign lesions such as cysts. However, these are technically too small to characterize by CT. Possibilities for further workup include surveillance or hepatic protocol MRI with and without contrast. 3. There is also a tiny hypodense lesion in the spleen which is technically nonspecific but statistically highly likely to be benign. 4.  Prominent stool throughout the colon favors constipation      01/16/2017 - 06/05/2017 Chemotherapy    Neoadjuvant chemo AC every 2 weeks for 4  cycles starting on 01/16/2017 and ended 02/27/17. Then proceed with weekly taxol for 12 treatments starting 03/13/17. Added Carboplatin AUC 2 on 04/03/17 to weekly taxol. Due to symptomatic thrombocytopenia we held Carbo since cycle 8. Cycle 9 was post-poned due to neutropenia and Granix was added starting 05/12/17. Added carbo  back at reduced dose AUC 1.5 with cycle 10 on 05/22/17. 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   Analysis also detected a Variant of Uncertain Significance (VUS) in the NBN gene called c.628G>T (p.Val210Phe)      06/12/2017 Imaging    MR BREAST BILATERAL WO CONTRAST IMPRESSION: 1. Significant positive response to neoadjuvant chemotherapy. The index carcinoma in the left breast has significantly decreased in size. Left axillary adenopathy has also significantly improved. 2. No other evidence of malignancy.      07/16/2017 Surgery    Pt underwent a left mastectomy with Dr. Donne Hazel      07/16/2017 Pathology Results    Diagnosis 07/16/17 1. Breast, simple mastectomy, Right Prophylactic - FIBROCYSTIC CHANGES INCLUDING APOCRINE METAPLASIA - DUCT ECTASIA - CALCIFICATIONS - NO MALIGNANCY IDENTIFIED 2. Lymph node, sentinel, biopsy, Left Axillary - NO CARCINOMA IDENTIFIED IN ONE LYMPH NODE (0/1) - SEE COMMENT 3. Lymph node, sentinel, biopsy, Left - NO CARCINOMA IDENTIFIED IN ONE LYMPH NODE (0/1) - SEE COMMENT 4. Breast, simple mastectomy, Left - INVASIVE DUCTAL CARCINOMA, NOTTINGHAM GRADE 2 OF 3, 1.3 CM - MARGINS UNINVOLVED BY CARCINOMA (2 CM POSTERIOR MARGIN) - PREVIOUS BIOPSY SITE CHANGES - CALCIFICATIONS - SEE ONCOLOGY TABLE BELOW      07/16/2017 Receptors her2    Results: IMMUNOHISTOCHEMICAL AND MORPHOMETRIC ANALYSIS PERFORMED MANUALLY Estrogen Receptor: 0%, NEGATIVE Progesterone Receptor: 0%, NEGATIVE Results: HER2 - NEGATIVE RATIO OF HER2/CEP17 SIGNALS 1.73 AVERAGE HER2 COPY NUMBER PER CELL 2.67        HISTORY OF PRESENTING ILLNESS: 01/07/17 Kathryn Stanley 42 y.o. female is here because of newly diagnosed Malignant neoplasm of upper-outer quadrant of left breast in female, estrogen receptor positive. She presents to the breast clinic  today with her common law husband. She felt the lump herself 3 months ago. She felt pain like "stabbing" first. She then had a mammogram 12/25/16 and subsequent biopsy   In the past she was using smoking match and quit smoking "cold [place]" that was 1 month ago.   Today she reports her breast pain still comes and goes. She has not noticed any new changes. 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 Lately has regular period. She has a weak cervix and her children were born premature. She is currently working at [employer] and works on her feet all the time, working 5 days a week.   GYN HISTORY  Menarchal: 12 LMP: 12/10/16 Contraceptive: No HRT: NA GP: G2P1 - twins    CURRENT THERAPY:  PENDING Adjuvant radiation therapy with Dr Lisbeth Volcy starting on 08/18/17  INTERVAL HISTORY:  Kathryn Stanley is here for a follow-up. She presents to the clinic with her husband. Of note since last visit, she underwent a bilateral mastectomy with Dr. Donne Hazel on 07/16/17. She notes she is doing well overall and recovering from surgery well. She has some pain with lifting her left arm. She has been using Tylenol. She reports that she has scheduled her BSO  with Dr. Denman George for 11/03/17 and plans to schedule a breast reconstruction with Dr. Iran Planas.    On review of systems, pt denies fever, or any other complaints at this time. Pertinent positives are listed and detailed within the above HPI.   MEDICAL HISTORY:  Past Medical History:  Diagnosis Date  . Breast cancer, left breast ([place]) dx'd 01/2017  . Dermatologic problem    possible psoriasis. Pt has not had been diagnosed by dermatology.  . 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 GERD (gastroesophageal reflux disease)   . Tobacco dependence    trying to quit with nicotene patches    SURGICAL HISTORY: Past Surgical  History:  Procedure Laterality Date  . BREAST BIOPSY Left 12/2016  . BREAST RECONSTRUCTION Bilateral 07/16/2017   BILATERAL BREAST RECONSTRUCTION WITH PLACEMENT OF TISSUE EXPANDER AND ALLODERM/notes 07/16/2017  . CESAREAN SECTION  2002  . MASTECTOMY Bilateral 07/16/2017   LEFT SKIN SPARING MASTECTOMY WITH LEFT RADIOACTIVE SEED TARGETED AXILLARY LYMPH NODE EXCISION AND AXILLARY SENTINEL LYMPH NODE BIOPSY; RIGHT PROPHYLACTIC SKIN SPARING MASTECTOMY [name] 07/16/2017  . MASTECTOMY WITH RADIOACTIVE SEED GUIDED EXCISION AND AXILLARY SENTINEL LYMPH NODE BIOPSY Left 07/16/2017   Procedure: LEFT SKIN SPARING MASTECTOMY WITH LEFT RADIOACTIVE SEED TARGETED AXILLARY LYMPH NODE EXCISION AND AXILLARY SENTINEL LYMPH NODE BIOPSY;  Surgeon: [name], MD;  Location: [facility];  Service: General;  Laterality: Left;  . NIPPLE SPARING MASTECTOMY Right 07/16/2017   Procedure: RIGHT PROPHYLACTIC SKIN SPARING MASTECTOMY;  Surgeon: [name], MD;  Location: [facility];  Service: General;  Laterality: Right;  . PORT-A-CATH REMOVAL N/A 07/16/2017   Procedure: REMOVAL PORT-A-CATH;  Surgeon: [name], MD;  Location: [facility];  Service: General;  Laterality: N/A;  . PORTA CATH REMOVAL  07/16/2017  . PORTACATH PLACEMENT N/A 01/14/2017   Procedure: INSERTION PORT-A-CATH WITH [place];  Surgeon: [name], MD;  Location: [facility];  Service: General;  Laterality: N/A;    SOCIAL HISTORY: Social History   Socioeconomic History  . Marital status: Married    Spouse name: Not on file  . Number of children: Not on file  . Years of education: Not on file  . Highest education level: Not on file  Social Needs  . Financial resource strain: Not on file  . Food insecurity - worry: Not on file  . Food insecurity - inability: Not on file  . Transportation needs - medical: Not on file  . Transportation needs - non-medical: Not on file  Occupational History  . Not on file  Tobacco Use  . Smoking status: Former Smoker     Packs/day: 1.00    Years: 17.00    Pack years: 17.00    Types: Cigarettes    Last attempt to quit: 12/15/2016    Years since quitting: 0.6  . Smokeless tobacco: Never Used  Substance and Sexual Activity  . Alcohol use: No  . Drug use: No  . Sexual activity: Yes    Birth control/protection: None  Other Topics Concern  . Not on file  Social History Narrative  . Not on file    FAMILY HISTORY: Family History  Problem Relation Age of Onset  . Stroke Maternal Grandmother   . Stroke Father   . Hypertension Father   . Other Father        Adopted    ALLERGIES:  is allergic to amoxicillin; chlorhexidine gluconate; and nicotine.  MEDICATIONS:  Current Outpatient Medications  Medication Sig Dispense Refill  . famotidine (PEPCID) 10 MG tablet Take 10 mg by mouth daily as needed for heartburn or indigestion.    Marland Kitchen ibuprofen (ADVIL,MOTRIN) 200 MG tablet Take 200-400 mg by mouth daily as needed for headache or moderate pain.     No current facility-administered medications for this visit.    REVIEW OF SYSTEMS:  Constitutional: Denies abnormal night sweats.  (+) complete hair loss  Eyes: Denies blurriness of vision, double vision or watery eyes Ears, nose, mouth, throat, and face: Denies mucositis or sore throat Respiratory: Denies dyspnea or wheezes   Cardiovascular: Denies palpitation, chest discomfort (+) persistent lower extremity swelling Gastrointestinal:  Denies nausea, heartburn.  Skin: Denies abnormal skin rashes Lymphatics: Denies new lymphadenopathy or easy bruising Neurological:Denies numbness, tingling or new weaknesses MSK: negative Breast: (+) Status post bilateral mastectomy with tissue expander, incisions are healing well without discharge or surrounding skin erythema Behavioral/Psych: Mood is stable, no new changes  All other systems were reviewed with the patient and are negative.  PHYSICAL EXAMINATION:  ECOG PERFORMANCE STATUS: 1  Vitals:   08/03/17 1006  BP:  119/63  Pulse: 87  Resp: 18  Temp: 97.9 F (36.6 C)  SpO2: 100%   Filed Weights   08/03/17 1006  Weight: 199 lb 4.8 oz (90.4 kg)     GENERAL:alert, no distress and comfortable SKIN: skin color, texture, turgor are normal, no rashes or significant lesions EYES: normal, conjunctiva are pink and non-injected, sclera clear OROPHARYNX:no exudate, no erythema and lips, buccal mucosa, and tongue normal  NECK: supple, thyroid normal size, non-tender, without nodularity LYMPH:  no palpable lymphadenopathy in the cervical, axillary or inguinal LUNGS: clear to auscultation and percussion with normal breathing effort HEART: regular rate & rhythm and no murmurs and no lower extremity edema ABDOMEN:abdomen soft, non-tender and normal bowel sounds Musculoskeletal:no cyanosis of digits and no clubbing  PSYCH: alert & oriented x 3 with fluent speech NEURO: no focal motor/sensory deficits Breast: She is s/p b/l breast mastectomy. Her surgical incisions have started to heal well.  LABORATORY DATA:  I have reviewed the data as listed CBC Latest Ref Rng & Units 08/03/2017 07/09/2017 06/23/2017  WBC 3.9 - 10.3 K/uL 4.1 4.8 3.9  Hemoglobin 11.6 - 15.9 g/dL 12.3 13.1 12.1  Hematocrit 34.8 - 46.6 % 37.1 38.9 35.4  Platelets 145 - 400 K/uL 199 193 207    CMP Latest Ref Rng & Units 08/03/2017 06/23/2017 06/05/2017  Glucose 70 - 140 mg/dL 130 93 129  BUN 7 - 26 mg/dL 23 18 25.7  Creatinine 0.60 - 1.10 mg/dL 0.85 0.83 0.8  Sodium 136 - 145 mmol/L 143 139 140  Potassium 3.5 - 5.1 mmol/L 3.8 3.7 3.7  Chloride 98 - 109 mmol/L 107 107 -  CO2 22 - 29 mmol/L 25 24 22   Calcium 8.4 - 10.4 mg/dL 9.5 9.1 9.0  Total Protein 6.4 - 8.3 g/dL 6.8 6.6 6.7  Total Bilirubin 0.2 - 1.2 mg/dL 0.3 0.4 0.22  Alkaline Phos 40 - 150 U/L 80 71 72  AST 5 - 34 U/L 26 54(H) 23  ALT 0 - 55 U/L 51 90(H) 46     PATHOLOGY  Diagnosis 12/29/16 1. Breast, left, needle core biopsy, stereotactic, upper inner quadrant - FIBROCYSTIC  CHANGES INCLUDING APOCRINE METAPLASIA WITH CALCIFICATIONS - NO MALIGNANCY IDENTIFIED 2. Breast, left, needle core biopsy, ultrasound, 1:30 o'clock - INVASIVE MAMMARY CARCINOMA - SEE COMMENT 3. Lymph node, needle/core biopsy, ultrasound, left axillary - METASTIC CARCINOMA INVOLVING  ONE LYMPH NODE (1/1) Microscopic Comment 2. The biopsy material shows an infiltrative proliferation of cells with large vesicular nuclei with conspicuous nucleoli, arranged linearly and in small clusters. There are scattered cells with marked pleomorphism and cytologic atypia. Based on the biopsy, the carcinoma appears Nottingham grade 3 of 3 and measures 0.9 cm in greatest linear extent. E-cadherin and prognostic markers (ER/PR/ki-67/HER2-FISH)are pending and will be reported in an addendum. Dr. Tresa Moore reviewed the case and agrees with the above diagnosis. This case was called to [facility] on December 30, 2016. Results: IMMUNOHISTOCHEMICAL AND MORPHOMETRIC ANALYSIS PERFORMED MANUALLY Estrogen Receptor: 15%, POSITIVE, WEAK STAINING INTENSITY Progesterone Receptor: 0%, NEGATIVE Proliferation Marker Ki67: 10% COMMENT: The negative hormone receptor study(ies) in this case has An internal positive control. Results: HER2 - NEGATIVE RATIO OF HER2/CEP17 SIGNALS 1.63 AVERAGE HER2 COPY NUMBER PER CELL 3.10  Diagnosis 07/16/17 1. Breast, simple mastectomy, Right Prophylactic - FIBROCYSTIC CHANGES INCLUDING APOCRINE METAPLASIA - DUCT ECTASIA - CALCIFICATIONS - NO MALIGNANCY IDENTIFIED 2. Lymph node, sentinel, biopsy, Left Axillary - NO CARCINOMA IDENTIFIED IN ONE LYMPH NODE (0/1) - SEE COMMENT 3. Lymph node, sentinel, biopsy, Left - NO CARCINOMA IDENTIFIED IN ONE LYMPH NODE (0/1) - SEE COMMENT 4. Breast, simple mastectomy, Left - INVASIVE DUCTAL CARCINOMA, NOTTINGHAM GRADE 2 OF 3, 1.3 CM - MARGINS UNINVOLVED BY CARCINOMA (2 CM POSTERIOR MARGIN) - PREVIOUS BIOPSY SITE CHANGES - CALCIFICATIONS -  SEE ONCOLOGY TABLE BELOW Microscopic Comment 2. and 3. Cytokeratin AE1/3 was performed on the sentinel lymph nodes to exclude micrometastasis. There is no evidence of metastatic carcinoma by immunohistochemistry. 4. BREAST, STATUS POST NEOADJUVANT TREATMENT Procedure: Total mastectomy Laterality: Left Tumor Size: 1.3 cm (glass slide measurement) Histologic Type: Invasive carcinoma of no special type (ductal, not otherwise specified) Grade: Nottingham Grade 2 Tubular Differentiation: 3 Nuclear Pleomorphism: 3 Mitotic Count: 1 Ductal Carcinoma in Situ (DCIS): Not identified Regional Lymph Nodes: Number of Lymph Nodes Examined: 2 Number of Sentinel Lymph Nodes Examined: 2 Lymph Nodes with Macrometastases: 0 Lymph Nodes with Micrometastases: 0 2 of 5 FINAL for DELANEE, XIN ([id number]) Microscopic Comment(continued) Lymph Nodes with Isolated Tumor Cells: 0 Margins: Uninvolved by invasive carcinoma Invasive carcinoma, distance from closest margin: 2 cm (posterior/deep) DCIS, distance from closest margin: N/A Breast Prognostic Profile (pre-neoadjuvant case #: [id number]) Estrogen Receptor: Positive (15%, weak) Progesterone Receptor: Negative Her2: Negative (Ratio 1.63) Ki-67: 10% Will be repeated on the current case (Block #: [id number]) and the results reported separately. Residual Cancer Burden (RCB): Primary Tumor Bed: 13 mm x 10 mm Overall Cancer Cellularity: 20% Percentage of Cancer that is in Situ: N/A Number of Positive Lymph Nodes: 0 Diameter of Largest Lymph Node metastasis: N/A Residual Cancer Burden : 1.611 Residual Cancer Burden Class: RCB-II Pathologic Stage Classification (p TNM, AJCC 8th Edition): Primary Tumor: ypT1c Regional Lymph Nodes: ypN0 COMMENT: E-cadherin immunohistochemistry was performed on the biopsy material and was positive supporting a ductal origin. Results: IMMUNOHISTOCHEMICAL AND MORPHOMETRIC ANALYSIS PERFORMED MANUALLY Estrogen Receptor: 0%,  NEGATIVE Progesterone Receptor: 0%, NEGATIVE Results: HER2 - NEGATIVE RATIO OF HER2/CEP17 SIGNALS 1.73 AVERAGE HER2 COPY NUMBER PER CELL 2.67  PROCEDURES  ECHO 01/13/17 Study Conclusions - Left ventricle: The cavity size was normal. Systolic function was   normal. The estimated ejection fraction was in the range of 60%   to 65%. Wall motion was normal; there were no regional wall   motion abnormalities. Left ventricular diastolic function   parameters were normal. - Mitral valve: There was trivial regurgitation. - Right  atrium: The atrium was mildly dilated. - Tricuspid valve: There was trivial regurgitation.  RADIOGRAPHIC STUDIES: I have personally reviewed the radiological images as listed and agreed with the findings in the report. US Pelvis Transvanginal Non-ob (tv Only)  Result Date: 08/03/2017 CLINICAL DATA:  BRCA1 gene mutation, assess ovaries EXAM: TRANSABDOMINAL AND TRANSVAGINAL ULTRASOUND OF PELVIS TECHNIQUE: Both transabdominal and transvaginal ultrasound examinations of the pelvis were performed. Transabdominal technique was performed for global imaging of the pelvis including uterus, ovaries, adnexal regions, and pelvic cul-de-sac. It was necessary to proceed with endovaginal exam following the transabdominal exam to visualize the endometrium and RIGHT ovary. COMPARISON:  CT abdomen pelvis 01/15/2017 FINDINGS: Uterus Measurements: 8.3 x 4.0 x 5.3 cm. Inhomogeneous myometrium without focal mass. Endometrium Thickness: 3 mm thick, normal. No endometrial fluid or focal abnormality. Right ovary Measurements: 3.0 x 1.2 x 2.0 cm.  Normal appearance without mass. Left ovary Measurements: 2.6 x 1.2 x 2.3 cm.  Normal appearance without mass. Other findings No abnormal free fluid. IMPRESSION: No focal sonographic abnormalities identified. Electronically Signed   By: Lavonia Dana M.D.   On: 08/03/2017 14:45   US Pelvis Complete  Result Date: 08/03/2017 CLINICAL DATA:  BRCA1 gene mutation,  assess ovaries EXAM: TRANSABDOMINAL AND TRANSVAGINAL ULTRASOUND OF PELVIS TECHNIQUE: Both transabdominal and transvaginal ultrasound examinations of the pelvis were performed. Transabdominal technique was performed for global imaging of the pelvis including uterus, ovaries, adnexal regions, and pelvic cul-de-sac. It was necessary to proceed with endovaginal exam following the transabdominal exam to visualize the endometrium and RIGHT ovary. COMPARISON:  CT abdomen pelvis 01/15/2017 FINDINGS: Uterus Measurements: 8.3 x 4.0 x 5.3 cm. Inhomogeneous myometrium without focal mass. Endometrium Thickness: 3 mm thick, normal. No endometrial fluid or focal abnormality. Right ovary Measurements: 3.0 x 1.2 x 2.0 cm.  Normal appearance without mass. Left ovary Measurements: 2.6 x 1.2 x 2.3 cm.  Normal appearance without mass. Other findings No abnormal free fluid. IMPRESSION: No focal sonographic abnormalities identified. Electronically Signed   By: Lavonia Dana M.D.   On: 08/03/2017 14:45   Nm Sentinel Node Inj-no Rpt (breast)  Result Date: 07/16/2017 Sulfur colloid was injected by the nuclear medicine technologist for melanoma sentinel node.   Mm Breast Surgical Specimen  Addendum Date: 07/16/2017   ADDENDUM REPORT: 07/16/2017 09:37 ADDENDUM: An additional image was acquired of the same specimen within the grid, and the locations of both the seed and the clip were conveyed to the OR at 9:36 a.m. Electronically Signed   By: Ammie Ferrier M.D.   On: 07/16/2017 09:37   Result Date: 07/16/2017 CLINICAL DATA:  Specimen radiograph status post lymph node dissection. EXAM: SPECIMEN RADIOGRAPH OF THE LEFT BREAST COMPARISON:  Previous exam(s). FINDINGS: Status post excision of the left breast. The radioactive seed and biopsy marker clip are present and completely intact. These findings were communicated with the OR at 10:15 a.m. IMPRESSION: Specimen radiograph of the left breast. Electronically Signed: By: Ammie Ferrier  M.D. On: 07/16/2017 09:24   [place] Radioactive Seed Loc  Result Date: 07/15/2017 CLINICAL DATA:  Patient presents for seed localization of a metastatic left axillary lymph node prior to bilateral mastectomy for known left breast cancer and metastatic disease to the axilla. EXAM: ULTRASOUND GUIDED RADIOACTIVE SEED LOCALIZATION OF THE LEFT BREAST COMPARISON:  Previous exam(s). FINDINGS: Patient presents for radioactive seed localization prior to targeted axillary node dissection. I met with the patient and we discussed the procedure of seed localization including benefits and alternatives. We discussed  the high likelihood of a successful procedure. We discussed the risks of the procedure including infection, bleeding, tissue injury and further surgery. We discussed the low dose of radioactivity involved in the procedure. Informed, written consent was given. The usual time-out protocol was performed immediately prior to the procedure. Using ultrasound guidance, sterile technique, 1% lidocaine and an I-125 radioactive seed, lymph node associated with a spiral shaped clip in the left axilla was localized using a lateral approach. The follow-up mammogram images confirm the seed in the expected location and were marked for Dr. Donne Hazel. Follow-up survey of the patient confirms presence of the radioactive seed. Order number of I-125 seed:  [id number]. Total activity:  2.671 millicuries reference Date: 07/07/2017 The patient tolerated the procedure well and was released from the [employer]. She was given instructions regarding seed removal. IMPRESSION: Radioactive seed localization of left axillary lymph node. No apparent complications. Electronically Signed   By: Nolon Nations M.D.   On: 07/15/2017 13:45   Mm Clip Placement Left  Result Date: 07/15/2017 CLINICAL DATA:  Status post placement of seed in the left axilla prior to targeted axillary node dissection for known metastatic left breast cancer. EXAM:  DIAGNOSTIC LEFT MAMMOGRAM POST ULTRASOUND-GUIDED RADIOACTIVE SEED PLACEMENT COMPARISON:  Previous exam(s). FINDINGS: Mammographic images were obtained following ultrasound-guided radioactive seed placement. These demonstrate radioactive seed adjacent to a spiral shaped clip in the left axilla. IMPRESSION: Appropriate location of the radioactive seed. Final Assessment: Post Procedure Mammograms for Seed Placement Electronically Signed   By: Nolon Nations M.D.   On: 07/15/2017 13:50   ASSESSMENT & PLAN:  Kathryn Stanley is a 42 y.o. premenopausal female with no significant past medical history, presented with a palpable left breast mass.  1. Malignant neoplasm of upper-outer quadrant of left breast in female, ductal carcinoma, cT2N1M0, Stage IIIa, ER weakly +, PR - , HER2 - , Grade 3, ypT1cN0M0, triple negative  -We reviewed her mammogram and initial biopsy results in details with patient and her husband.  -Her tumor is weakly ER positive, PR negative, HER-2 negative, grade 3, she has positive lymph nodes, those are all high risks for recurrence. I recommend neoadjuvant or adjuvant chemotherapy to reduce her risk of recurrence after surgery. -We discussed that the benefit of neoadjuvant chemotherapy, to shrink the tumor and lymph nodes, and potentially she would be a candidate for lumpectomy and may not need lymph node dissection if she has good response. I have discussed this with her surgeon Dr. Donne Hazel, both of [place] recommend her to consider neoadjuvant chemotherapy. She agrees.  -we reviewed her staging scan CT from 01/15/17 in person. A few hypodense lesion were seen in the liver, appear to be benign cysts. CT and bone scan otherwise negative for distant metastasis. 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 This was discussed with patient. -I recommended neoadjuvant chemotherapy with dose dense Adriamycin and Cytoxan (AC) every 2 weeks for 4 cycles, followed by weekly Taxol for 12  weeks. She agreed. -She completed AC treatment 01/16/17-02/27/17. She tolerated well other than bone pain and constipation. She has had good clinical response to treatment as her tumor is slightly smaller and her lymph node is no longer palpable upon 03/13/17 breast exam.  -She started her weekly taxol 03/13/17, tolerating well so far -We added Carboplatin with cycle 4 on 04/03/17. She tolerated well. Due to symptomatic thrombocytopenia we held Carbo since cycle 8. Cycle 9 was post poned due to neutropenia and Granix was added starting 05/12/17.  We added carbo at reduced dose AUC 1.5 to her taxol with cycle 10. She will complete her CT treatment on 12/28/8 -Pt's breast MRI on 06/12/17 revealed significant positive response to neoadjuvant chemotherapy. The index carcinoma in the left breast has significantly decreased in size. Left axillary adenopathy has also significantly improved. There is no other evidence of malignancy. -Pt underwent a bilateral mastectomy with Dr. Donne Hazel on 07/16/17. Her pathology results reveal a 1.3 tumor removed with all clean margins. There were no positive lymph nodes for carcinoma. Her receptors indicate triple negative. -Due to her triple negative disease, residual 1.3cm tumor and its agressiveness in nature, I recommend adjuvant chemo Xeloda for 4-6 months. I gave reading material on this today and discussed benefits and side effects in details.  -I discussed the option of clinical trial for immunotherapy Keytruda in triple negative cancer briefly. She will be seen by research nurse at her 3/12 rad onc appointment.  -her tumor was weakly ER positive on initial biopsy, and became triple negative after neoadjuvant chemotherapy.  I think the benefit of antiestrogen therapy is small.  She is planned to have bilateral oophorectomy in the near future positive for BRCA1 mutation. -She has met with radiation Oncology, Dr Lisbeth Worthley on 07/29/17 who recommending radiation due to her initial positive  lymph nodes. He anticipates 6.5 weeks of treatment. I She is scheduled to start the week of 08/18/17. I discussed that Xeloda may make the the side effects of radiation worse. So we will start after she completes RT -Pt has scheduled her BSO for 11/03/17 and breast reconstruction in about 6 months after completing radiation.  -Labs today (08/03/17) CBC and CMP are WNL, she is recovering well from surgery   -F/u in 6 weeks  2. Genetics: BRCA1 + -Due to her young age of diagnosis I suggested she get genetic testing. 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  -She is agreeable with bilateral oophorectomy and mastectomy, she has seen her breast surgeon Dr. Donne Hazel I again and reviewed bilateral mastectomy and reconstruction.  She has been seen by plastic surgeon Dr. Ashley Mariner -I explained when her children get older they need to get tested and start cancer screenings earlier.  -I refered her to GYN oncologist Dr. Denman George for BSO  3. Financial Support -They are not insured and are attempting to get an orange card.  -They have met our financial support/social worker  4. Transmantitis  -05/22/17 show increase in AST at 44 and ALT at 102, likely secondary to chemotherapy -resolved now after she completed chemo    PLAN:  -Start radiation week of 3/12 -Research nurse to meet with her at [facility] 3/12 -F/u first week of April to discuss adjuvant chemo Xeloda and immunotherapy clinical trial   All questions were answered. The patient knows to call the clinic with any problems, questions or concerns. I spent 30 minutes counseling the patient face to face. The total time spent in the appointment was 40 minutes and more than 50% was on counseling.  This document serves as a record of  services personally performed by [name], MD. It was created on her behalf by Theresia Bough, a trained medical scribe. The creation of this record is based on the scribe's personal observations and the provider's statements to them.   I have reviewed the  above documentation for accuracy and completeness, and I agree with the above.   [name]  08/03/2017

## 2017-08-03 ENCOUNTER — Telehealth: Payer: Self-pay

## 2017-08-03 ENCOUNTER — Inpatient Hospital Stay (HOSPITAL_BASED_OUTPATIENT_CLINIC_OR_DEPARTMENT_OTHER): Payer: Medicaid Other | Admitting: Hematology

## 2017-08-03 ENCOUNTER — Ambulatory Visit (HOSPITAL_COMMUNITY)
Admission: RE | Admit: 2017-08-03 | Discharge: 2017-08-03 | Disposition: A | Discharge status: Home / Self Care | Payer: Medicaid Other | Source: Ambulatory Visit | Attending: Gynecologic Oncology | Admitting: Gynecologic Oncology

## 2017-08-03 ENCOUNTER — Encounter: Payer: Self-pay | Admitting: Hematology

## 2017-08-03 ENCOUNTER — Inpatient Hospital Stay: Payer: Medicaid Other

## 2017-08-03 VITALS — BP 119/63 | HR 87 | Temp 97.9°F | Resp 18 | Ht 64.0 in | Wt 199.3 lb

## 2017-08-03 DIAGNOSIS — Z79899 Other long term (current) drug therapy: Secondary | ICD-10-CM

## 2017-08-03 DIAGNOSIS — C50412 Malignant neoplasm of upper-outer quadrant of left female breast: Secondary | ICD-10-CM | POA: Diagnosis not present

## 2017-08-03 DIAGNOSIS — Z171 Estrogen receptor negative status [ER-]: Secondary | ICD-10-CM | POA: Diagnosis not present

## 2017-08-03 DIAGNOSIS — Z170 Estrogen receptor positive status [ER+]: Principal | ICD-10-CM

## 2017-08-03 DIAGNOSIS — G893 Neoplasm related pain (acute) (chronic): Secondary | ICD-10-CM

## 2017-08-03 DIAGNOSIS — C773 Secondary and unspecified malignant neoplasm of axilla and upper limb lymph nodes: Secondary | ICD-10-CM | POA: Diagnosis not present

## 2017-08-03 DIAGNOSIS — Z1509 Genetic susceptibility to other malignant neoplasm: Secondary | ICD-10-CM

## 2017-08-03 DIAGNOSIS — Z9221 Personal history of antineoplastic chemotherapy: Secondary | ICD-10-CM

## 2017-08-03 DIAGNOSIS — Z1501 Genetic susceptibility to malignant neoplasm of breast: Secondary | ICD-10-CM | POA: Insufficient documentation

## 2017-08-03 DIAGNOSIS — R948 Abnormal results of function studies of other organs and systems: Secondary | ICD-10-CM

## 2017-08-03 DIAGNOSIS — Z87891 Personal history of nicotine dependence: Secondary | ICD-10-CM | POA: Diagnosis not present

## 2017-08-03 DIAGNOSIS — K219 Gastro-esophageal reflux disease without esophagitis: Secondary | ICD-10-CM

## 2017-08-03 DIAGNOSIS — Z9012 Acquired absence of left breast and nipple: Secondary | ICD-10-CM

## 2017-08-03 DIAGNOSIS — Z9013 Acquired absence of bilateral breasts and nipples: Secondary | ICD-10-CM

## 2017-08-03 LAB — COMPREHENSIVE METABOLIC PANEL
ALBUMIN: 3.5 g/dL (ref 3.5–5.0)
ALK PHOS: 80 U/L (ref 40–150)
ALT: 51 U/L (ref 0–55)
ANION GAP: 11 (ref 3–11)
AST: 26 U/L (ref 5–34)
BILIRUBIN TOTAL: 0.3 mg/dL (ref 0.2–1.2)
BUN: 23 mg/dL (ref 7–26)
CALCIUM: 9.5 mg/dL (ref 8.4–10.4)
CO2: 25 mmol/L (ref 22–29)
Chloride: 107 mmol/L (ref 98–109)
Creatinine, Ser: 0.85 mg/dL (ref 0.60–1.10)
GFR calc Af Amer: >60 mL/min (ref >60)
GFR calc non Af Amer: >60 mL/min (ref >60)
GLUCOSE: 130 mg/dL (ref 70–140)
Potassium: 3.8 mmol/L (ref 3.5–5.1)
Sodium: 143 mmol/L (ref 136–145)
TOTAL PROTEIN: 6.8 g/dL (ref 6.4–8.3)

## 2017-08-03 LAB — CBC WITH DIFFERENTIAL/PLATELET
BASOS ABS: 0 10*3/uL (ref 0.0–0.1)
BASOS PCT: 0 %
EOS PCT: 2 %
Eosinophils Absolute: 0.1 10*3/uL (ref 0.0–0.5)
HCT: 37.1 % (ref 34.8–46.6)
Hemoglobin: 12.3 g/dL (ref 11.6–15.9)
Lymphocytes Relative: 36 %
Lymphs Abs: 1.5 10*3/uL (ref 0.9–3.3)
MCH: 29.1 pg (ref 25.1–34.0)
MCHC: 33.2 g/dL (ref 31.5–36.0)
MCV: 87.7 fL (ref 79.5–101.0)
MONO ABS: 0.3 10*3/uL (ref 0.1–0.9)
Monocytes Relative: 8 %
Neutro Abs: 2.3 10*3/uL (ref 1.5–6.5)
Neutrophils Relative %: 54 %
Platelets: 199 10*3/uL (ref 145–400)
RBC: 4.23 MIL/uL (ref 3.70–5.45)
RDW: 12.6 % (ref 11.2–14.5)
WBC: 4.1 10*3/uL (ref 3.9–10.3)

## 2017-08-03 NOTE — Telephone Encounter (Signed)
Printed avs and calender of upcoming appointment. Per 2/25 los

## 2017-08-05 ENCOUNTER — Telehealth: Payer: Self-pay

## 2017-08-05 ENCOUNTER — Telehealth: Payer: Self-pay | Admitting: Medical Oncology

## 2017-08-05 NOTE — Telephone Encounter (Signed)
[place] Dr. Burr Medico referred patient to study and informed me that she had discussed study with patient and patient expressed interest in more information. Attempted to call patient and introduce myself and see if we could meet when patient in clinic, on March 12th, for me to provide her with study information. Call to patient x2 with no option to leave a voice message. Patient's cell phone incorrect in her medical record. Will continue to call and speak with patient.  Adele Dan, RN, BSN Clinical Research 08/05/2017 2:00 PM

## 2017-08-05 NOTE — Telephone Encounter (Signed)
Told Ms Cadden that the US showed normal readings for her ovaries and endometrium per Joylene John, NP.

## 2017-08-18 ENCOUNTER — Ambulatory Visit
Admission: RE | Admit: 2017-08-18 | Discharge: 2017-08-18 | Disposition: A | Discharge status: Home / Self Care | Payer: Medicaid Other | Source: Ambulatory Visit | Attending: Radiation Oncology | Admitting: Radiation Oncology

## 2017-08-18 DIAGNOSIS — Z510 Encounter for antineoplastic radiation therapy: Secondary | ICD-10-CM | POA: Insufficient documentation

## 2017-08-18 DIAGNOSIS — Z170 Estrogen receptor positive status [ER+]: Secondary | ICD-10-CM | POA: Diagnosis not present

## 2017-08-18 DIAGNOSIS — C50412 Malignant neoplasm of upper-outer quadrant of left female breast: Secondary | ICD-10-CM | POA: Diagnosis not present

## 2017-08-18 NOTE — Progress Notes (Signed)
  Radiation Oncology         [phone]) [phone] ________________________________  Name: Kathryn Stanley MRN: [id number]  Date: 08/18/2017  DOB: [birth year]  Diagnosis DIAGNOSIS:     ICD-10-CM   1. Malignant neoplasm of upper-outer quadrant of left breast in female, estrogen receptor positive ([place]) C50.412    Z17.0      SIMULATION AND TREATMENT PLANNING NOTE  The patient presented for simulation prior to beginning her course of radiation treatment for her diagnosis of left-sided breast cancer. The patient was placed in a supine position on a breast board. A customized vac-lock bag was also constructed and this complex treatment device will be used on a daily basis during her treatment. In this fashion, a CT scan was obtained through the chest area and an isocenter was placed near the chest wall at the upper aspect of the right chest. A breath-hold technique has also been evaluated to determine if this significantly improves the spatial relationship between the target region and the heart. Based on this analysis, a breath-hold technique has been ordered for the patient's treatment.  The patient will be planned to receive a course of radiation initially to a dose of 50.4 gray. This will consist of a 4 field technique targeting the left chest wall as well as the supraclavicular region. Therefore 2 customized medial and lateral tangent fields have been created targeting the chest wall, and also 2 additional customized fields have been designed to treat the supraclavicular region both with a left supraclavicular field and a left posterior axillary boost field. A forward planning/reduced field technique will also be evaluated to determine if this significantly improves the dose homogeneity of the overall plan. Therefore, additional customized blocks/fields may be necessary.  This initial treatment will be accomplished at 1.8 gray per fraction.   The initial plan will consist of a 3-D conformal technique. The  target volume/scar, heart and lungs have been contoured and dose volume histograms of each of these structures will be evaluated as part of the 3-D conformal treatment planning process.   It is anticipated that the patient will then receive a 10 gray boost to the surgical scar. This will be accomplished at 2 gray per fraction. The final anticipated total dose therefore will correspond to 60.4 gray.  Special treatment procedure was performed today due to the extra time and effort required by myself to plan and prepare this patient for deep inspiration breath hold technique.  I have determined cardiac sparing to be of benefit to this patient to prevent long term cardiac damage due to radiation of the heart.  Bellows were placed on the patient's abdomen. To facilitate cardiac sparing, the patient was coached by the radiation therapists on breath hold techniques and breathing practice was performed. Practice waveforms were obtained. The patient was then scanned while maintaining breath hold in the treatment position.  This image was then transferred over to the imaging specialist. The imaging specialist then created a fusion of the free breathing and breath hold scans using the chest wall as the stable structure. I personally reviewed the fusion in axial, coronal and sagittal image planes.  Excellent cardiac sparing was obtained.  I felt the patient is an appropriate candidate for breath hold and the patient will be treated as such.  The image fusion was then reviewed with the patient to reinforce the necessity of reproducible breath hold.     _______________________________   Jodelle Gross, MD, PhD

## 2017-08-18 NOTE — Progress Notes (Signed)
  Radiation Oncology         [phone]) [phone] ________________________________  Name: Kathryn Stanley MRN: [id number]  Date: 08/18/2017  DOB: [birth year]  Optical Surface Tracking Plan:  Since intensity modulated radiotherapy (IMRT) and 3D conformal radiation treatment methods are predicated on accurate and precise positioning for treatment, intrafraction motion monitoring is medically necessary to ensure accurate and safe treatment delivery.  The ability to quantify intrafraction motion without excessive ionizing radiation dose can only be performed with optical surface tracking. Accordingly, surface imaging offers the opportunity to obtain 3D measurements of patient position throughout IMRT and 3D treatments without excessive radiation exposure.  I am ordering optical surface tracking for this patient's upcoming course of radiotherapy. ________________________________  [name], MD 08/18/2017 3:55 PM    Reference:   Ursula Alert, J, et al. Surface imaging-based analysis of intrafraction motion for breast radiotherapy patients.Journal of [place], n. 6, nov. 2014. ISSN [id number].   Available at: <http://www.jacmp.org/index.php/jacmp/article/view/4957>.

## 2017-08-20 DIAGNOSIS — Z510 Encounter for antineoplastic radiation therapy: Secondary | ICD-10-CM | POA: Diagnosis not present

## 2017-08-24 ENCOUNTER — Ambulatory Visit: Payer: Medicaid Other | Admitting: Rehabilitation

## 2017-08-25 ENCOUNTER — Other Ambulatory Visit: Payer: Self-pay

## 2017-08-25 ENCOUNTER — Ambulatory Visit: Payer: Medicaid Other | Admitting: Radiation Oncology

## 2017-08-25 ENCOUNTER — Ambulatory Visit: Payer: Medicaid Other | Attending: Plastic Surgery | Admitting: Physical Therapy

## 2017-08-25 ENCOUNTER — Encounter: Payer: Self-pay | Admitting: Physical Therapy

## 2017-08-25 DIAGNOSIS — M25612 Stiffness of left shoulder, not elsewhere classified: Secondary | ICD-10-CM | POA: Diagnosis not present

## 2017-08-25 DIAGNOSIS — M25611 Stiffness of right shoulder, not elsewhere classified: Secondary | ICD-10-CM | POA: Insufficient documentation

## 2017-08-25 DIAGNOSIS — M6281 Muscle weakness (generalized): Secondary | ICD-10-CM | POA: Insufficient documentation

## 2017-08-25 NOTE — Therapy (Signed)
[place], [place], [address] Phone: [phone]   Fax:  [phone]  Physical Therapy Evaluation  Patient Details  Name: Kathryn Stanley MRN: [id number] Date of Birth: Mar 18, 1976 Referring Provider: Dr. Iran Planas    Encounter Date: 08/25/2017  PT End of Session - 08/25/17 1710    Visit Number  1    Number of Visits  4    Date for PT Re-Evaluation  09/25/17    Authorization Type  Medicaid     PT Start Time  1600    PT Stop Time  1645    PT Time Calculation (min)  45 min    Activity Tolerance  Patient tolerated treatment well    Behavior During Therapy  [facility] for tasks assessed/performed       Past Medical History:  Diagnosis Date  . Breast cancer, left breast ([place]) dx'd 01/2017  . Dermatologic problem    possible psoriasis. Pt has not had been diagnosed by dermatology.  . 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 GERD (gastroesophageal reflux disease)   . Tobacco dependence    trying to quit with nicotene patches    Past Surgical History:  Procedure Laterality Date  . BREAST BIOPSY Left 12/2016  . BREAST RECONSTRUCTION Bilateral 07/16/2017   BILATERAL BREAST RECONSTRUCTION WITH PLACEMENT OF TISSUE EXPANDER AND ALLODERM/notes 07/16/2017  . CESAREAN SECTION  2002  . MASTECTOMY Bilateral 07/16/2017   LEFT SKIN SPARING MASTECTOMY WITH LEFT RADIOACTIVE SEED TARGETED AXILLARY LYMPH NODE EXCISION AND AXILLARY SENTINEL LYMPH NODE BIOPSY; RIGHT PROPHYLACTIC SKIN SPARING MASTECTOMY [name] 07/16/2017  . MASTECTOMY WITH RADIOACTIVE SEED GUIDED EXCISION AND AXILLARY SENTINEL LYMPH NODE BIOPSY Left 07/16/2017   Procedure: LEFT SKIN SPARING MASTECTOMY WITH LEFT RADIOACTIVE SEED TARGETED AXILLARY LYMPH NODE EXCISION AND AXILLARY SENTINEL LYMPH NODE BIOPSY;  Surgeon: [name], MD;  Location: [facility];  Service: General;  Laterality: Left;   . NIPPLE SPARING MASTECTOMY Right 07/16/2017   Procedure: RIGHT PROPHYLACTIC SKIN SPARING MASTECTOMY;  Surgeon: [name], MD;  Location: [facility];  Service: General;  Laterality: Right;  . PORT-A-CATH REMOVAL N/A 07/16/2017   Procedure: REMOVAL PORT-A-CATH;  Surgeon: [name], MD;  Location: [facility];  Service: General;  Laterality: N/A;  . PORTA CATH REMOVAL  07/16/2017  . PORTACATH PLACEMENT N/A 01/14/2017   Procedure: INSERTION PORT-A-CATH WITH [place];  Surgeon: [name], MD;  Location: [facility];  Service: General;  Laterality: N/A;    There were no vitals filed for this visit.   Subjective Assessment - 08/25/17 1622    Subjective  Feels well, no pain,  The expanders just feel like a tight bra     Pertinent History  Diagnosed in August 2018  on Feb 7,   bilateral mastectomy with 3 nodes removed on the left and immediate bilateral expanders, had chemo first  and will have radiatoin that starts next week     Patient Stated Goals  want to know what kind of exercises she can do and when she can start doing what she used to do before at work     Currently in Pain?  No/denies         [facility] PT Assessment - 08/25/17 0001      Assessment   Medical Diagnosis  breast cancer     Referring Provider  Dr. Iran Planas     Onset Date/Surgical Date  07/16/17    Hand  Dominance  Right      Precautions   Precautions  Other (comment)    Precaution Comments  nothing more than 5 pounds       Restrictions   Weight Bearing Restrictions  No      Balance Screen   Has the patient fallen in the past 6 months  No    Has the patient had a decrease in activity level because of a fear of falling?   No    Is the patient reluctant to leave their home because of a fear of falling?   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  scabbed area on left breast , not draiange     Quick DASH   29.55      Sensation   Additional Comments  numbness in left arm       Posture/Postural Control   Posture/Postural Control  Postural limitations      AROM   Right Shoulder Flexion  150 Degrees    Right Shoulder ABduction  145 Degrees    Left Shoulder Flexion  152 Degrees    Left Shoulder ABduction  120 Degrees        LYMPHEDEMA/ONCOLOGY QUESTIONNAIRE - 08/25/17 1636      Right Upper Extremity Lymphedema   10 cm Proximal to Olecranon Process  34.5 cm    Olecranon Process  27 cm    15 cm Proximal to Ulnar Styloid Process  28 cm    10 cm Proximal to Ulnar Styloid Process  26 cm    Just Proximal to Ulnar Styloid Process  17 cm    Across Hand at [employer]  21 cm    At [place] of 2nd Digit  6.5 cm      Left Upper Extremity Lymphedema   10 cm Proximal to Olecranon Process  32 cm    Olecranon Process  26.5 cm    15 cm Proximal to Ulnar Styloid Process  26.7 cm    10 cm Proximal to Ulnar Styloid Process  25 cm    Just Proximal to Ulnar Styloid Process  17 cm    Across Hand at [employer]  21 cm    At [place] of 2nd Digit  6.3 cm        Quick Dash - 08/25/17 0001    Open a tight or new jar  No difficulty    Do heavy household chores (wash walls, wash floors)  Moderate difficulty    Carry a shopping bag or briefcase  Mild difficulty    Wash your back  Moderate difficulty    Use a knife to cut food  No difficulty    Recreational activities in which you take some force or impact through your arm, shoulder, or hand (golf, hammering, tennis)  Mild difficulty    During the past week, to  what extent  has your arm, shoulder or hand problem interfered with your normal social activities with family, friends, neighbors, or groups?  Modererately    During the past week, to what extent has your arm, shoulder or hand problem limited your work or other regular daily activities  Modererately    Arm, shoulder, or hand pain.  Mild    Tingling (pins and needles) in your arm, shoulder, or hand  Mild    Difficulty Sleeping  Mild difficulty    DASH Score  29.55 %       Objective measurements completed on examination: See above findings.              PT Education - 08/25/17 1704    Education provided  Yes    Education Details  supine dowel rod exercise , information about ABC class and [employer]     Person(s) Educated  Patient    Methods  Explanation;Demonstration;Handout    Comprehension  Verbalized understanding;Returned demonstration          PT Long Term Goals - 08/25/17 1724      PT LONG TERM GOAL #1   Title  Pt will have 150 degrees of left shoulder abduction so that she can easily get in and out of position needed for radiation     Baseline  120 degrees     Time  4    Period  Weeks    Status  New      PT LONG TERM GOAL #2   Title  Pt will be independent in a home program for shoudler ROM and progressive strengthening     Baseline  no knowledge     Time  4    Period  Weeks    Status  New      PT LONG TERM GOAL #3   Title  Pt will verbalized strategies to decrease lymphedema risk     Baseline  no knowledge     Time  4    Period  Weeks    Status  New             Plan - 08/25/17 1718    Clinical Impression Statement  Pt is a 42 yo bilateral mastectomy patinet with bilateral expanders who will be recieving radiation  She needs to get stronger so that she can lift heavy objects at work. she has limited shoulder ROM     History and Personal Factors relevant to plan of care:  previous chemo     Clinical Presentation  Evolving    Clinical  Presentation due to:  will have radiation     Clinical Decision Making  Moderate    Rehab Potential  Good    Clinical Impairments Affecting Rehab Potential  3 nodes removed, will have radiation     PT Frequency  1x / week    PT Duration  4 weeks    PT Treatment/Interventions  ADLs/Self Care Home Management;Scar mobilization;Passive range of motion;Patient/family education;Orthotic Fit/Training;Therapeutic activities;Therapeutic exercise;Taping    PT Next Visit Plan  progress HEP for wall stretching and add pulleys  and A/AA/PROM for shoulder ROM Then, teach strength ABC program with no weights for now     PT Home Exercise Plan  dowel rod flexion and abduction     Recommended Other Services  ABC class     Consulted and Agree with Plan of Care  Patient       Patient will benefit from skilled therapeutic intervention in order to improve the  following deficits and impairments:  Decreased skin integrity, Increased fascial restricitons, Postural dysfunction, Decreased scar mobility, Decreased range of motion, Decreased strength, Impaired perceived functional ability, Impaired UE functional use  Visit Diagnosis: Stiffness of left shoulder, not elsewhere classified - Plan: PT plan of care cert/re-cert  Stiffness of right shoulder, not elsewhere classified - Plan: PT plan of care cert/re-cert  Muscle weakness (generalized) - Plan: PT plan of care cert/re-cert     Problem List Patient Active Problem List   Diagnosis Date Noted  . UTI (urinary tract infection) 04/24/2017  . 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  . Genetic testing 03/05/2017  . Port catheter in place 01/30/2017  . Malignant neoplasm of upper-outer quadrant of left breast in female, estrogen receptor positive ([place]) 01/05/2017  . Tobacco use 12/12/2016   Donato Heinz. Owens Shark PT  Norwood Levo 08/25/2017, 5:30 PM  [place], [place],  [address] Phone: [phone]   Fax:  [phone]  Name: Kathryn Stanley MRN: [id number] Date of Birth: 03-19-1976

## 2017-08-25 NOTE — Patient Instructions (Signed)
SHOULDER: Flexion - Supine (Cane)        Cancer Rehab [id number] ° ° ° °Hold cane in both hands. Raise arms up overhead. Do not allow back to arch. Hold _5__ seconds. Do __5-10__ times; __1-2__ times a day. ° ° °SELF ASSISTED WITH OBJECT: Shoulder Abduction / Adduction - Supine ° ° ° °Hold cane with both hands. Move both arms from side to side, keep elbows straight.  Hold when stretch felt for __5__ seconds. Repeat __5-10__ times; __1-2__ times a day. Once this becomes easier progress to third picture bringing affected arm towards ear by staying out to side. Same hold for _5_seconds. Repeat  _5-10_ times, _1-2_ times/day. ° °Shoulder Blade Stretch ° ° ° °Clasp fingers behind head with elbows touching in front of face. Pull elbows back while pressing shoulder blades together. Relax and hold as tolerated, can place pillow under elbow here for comfort as needed and to allow for prolonged stretch.  °Repeat __5__ times. Do __1-2__ sessions per day. ° ° ° ° ° ° °

## 2017-08-26 ENCOUNTER — Ambulatory Visit: Payer: Medicaid Other

## 2017-08-27 ENCOUNTER — Ambulatory Visit: Payer: Medicaid Other

## 2017-08-28 ENCOUNTER — Ambulatory Visit: Payer: Medicaid Other

## 2017-08-31 ENCOUNTER — Ambulatory Visit: Payer: Medicaid Other

## 2017-08-31 ENCOUNTER — Ambulatory Visit: Payer: Medicaid Other | Admitting: Physical Therapy

## 2017-09-01 ENCOUNTER — Ambulatory Visit: Payer: Medicaid Other

## 2017-09-01 ENCOUNTER — Ambulatory Visit
Admission: RE | Admit: 2017-09-01 | Discharge: 2017-09-01 | Disposition: A | Discharge status: Home / Self Care | Payer: Medicaid Other | Source: Ambulatory Visit | Attending: Radiation Oncology | Admitting: Radiation Oncology

## 2017-09-01 DIAGNOSIS — Z510 Encounter for antineoplastic radiation therapy: Secondary | ICD-10-CM | POA: Diagnosis not present

## 2017-09-02 ENCOUNTER — Ambulatory Visit: Payer: Medicaid Other

## 2017-09-02 ENCOUNTER — Ambulatory Visit
Admission: RE | Admit: 2017-09-02 | Discharge: 2017-09-02 | Disposition: A | Discharge status: Home / Self Care | Payer: Medicaid Other | Source: Ambulatory Visit | Attending: Radiation Oncology | Admitting: Radiation Oncology

## 2017-09-02 DIAGNOSIS — Z510 Encounter for antineoplastic radiation therapy: Secondary | ICD-10-CM | POA: Diagnosis not present

## 2017-09-03 ENCOUNTER — Ambulatory Visit: Payer: Medicaid Other | Admitting: Physical Therapy

## 2017-09-03 ENCOUNTER — Ambulatory Visit
Admission: RE | Admit: 2017-09-03 | Discharge: 2017-09-03 | Disposition: A | Discharge status: Home / Self Care | Payer: Medicaid Other | Source: Ambulatory Visit | Attending: Radiation Oncology | Admitting: Radiation Oncology

## 2017-09-03 ENCOUNTER — Ambulatory Visit: Payer: Medicaid Other

## 2017-09-03 DIAGNOSIS — Z510 Encounter for antineoplastic radiation therapy: Secondary | ICD-10-CM | POA: Diagnosis not present

## 2017-09-04 ENCOUNTER — Ambulatory Visit: Payer: Medicaid Other

## 2017-09-04 ENCOUNTER — Ambulatory Visit
Admission: RE | Admit: 2017-09-04 | Discharge: 2017-09-04 | Disposition: A | Discharge status: Home / Self Care | Payer: Medicaid Other | Source: Ambulatory Visit | Attending: Radiation Oncology | Admitting: Radiation Oncology

## 2017-09-04 DIAGNOSIS — Z170 Estrogen receptor positive status [ER+]: Principal | ICD-10-CM

## 2017-09-04 DIAGNOSIS — Z510 Encounter for antineoplastic radiation therapy: Secondary | ICD-10-CM | POA: Diagnosis not present

## 2017-09-04 DIAGNOSIS — C50412 Malignant neoplasm of upper-outer quadrant of left female breast: Secondary | ICD-10-CM

## 2017-09-04 MED ORDER — ALRA NON-METALLIC DEODORANT (RAD-ONC)
1.0000 "application " | Freq: Once | TOPICAL | Status: AC
  Administered 2017-09-04: 1 via TOPICAL

## 2017-09-04 MED ORDER — RADIAPLEXRX EX GEL
Freq: Once | CUTANEOUS | Status: AC
  Administered 2017-09-04: 15:00:00 via TOPICAL

## 2017-09-04 NOTE — Progress Notes (Signed)
Pt here for patient teaching.  Pt given Radiation and You booklet, skin care instructions, Alra deodorant and Radiaplex gel.  Reviewed areas of pertinence such as fatigue, hair loss, skin changes, breast tenderness and breast swelling . Pt able to give teach back of to pat skin and use unscented/gentle soap,apply Radiaplex bid, avoid applying anything to skin within 4 hours of treatment and to use an electric razor if they must shave. Pt demonstrated and verbalized understanding.  Cori Razor, RN

## 2017-09-04 NOTE — Progress Notes (Signed)
[place]  [phone] [phone]  Clinic Follow-up Note   Patient Care Team: [name], MD as PCP - General (Internal Medicine) [name], MD as Consulting Physician (General Surgery) [name], MD as Consulting Physician (Hematology) [name], MD as Consulting Physician (Radiation Oncology)   Date of Service:  09/07/2017  CHIEF COMPLAINTS:  Follow-up for Malignant neoplasm of upper-outer quadrant of left breast in female, estrogen receptor positive   Oncology History   Cancer Staging Malignant neoplasm of upper-outer quadrant of left breast in female, estrogen receptor positive ([place]) Staging form: Breast, AJCC 8th Edition - Clinical stage from 12/29/2016: Stage IIIA (cT2, cN1, cM0, G3, ER: Positive, PR: Negative, HER2: Negative) - Signed by [name], MD on 01/05/2017       Malignant neoplasm of upper-outer quadrant of left breast in female, estrogen receptor positive ([place])   12/25/2016 Mammogram    [place] [facility] Diagnostic Breast Tomo Bilateral 12/25/16 IMPRESSION: 1. Suspicious mass 2.3 x 1.6 x 3.0 cm  in the 130 o'clock location of the left breast 8cm from the nipple, biopsy recommended. Adjacent simple cyst is 1.9 cm. 2. Suspicious left axillary lymph node 2.2cm for which biopsy is indicated. 3. Indeterminate group of calcifications in the upper central portion of the left breast for which biopsy is recommended.       12/29/2016 Initial Biopsy    Diagnosis 12/29/16 1. Breast, left, needle core biopsy, stereotactic, upper inner quadrant - FIBROCYSTIC CHANGES INCLUDING APOCRINE METAPLASIA WITH CALCIFICATIONS - NO MALIGNANCY IDENTIFIED 2. Breast, left, needle core biopsy, ultrasound, 1:30 o'clock - INVASIVE MAMMARY CARCINOMA, G3 - SEE COMMENT 3. Lymph node, needle/core biopsy, ultrasound, left axillary - METASTIC CARCINOMA INVOLVING ONE LYMPH NODE (1/1) E-cadherin is positive supporting a ductal origin.       12/29/2016  Receptors her2    ER 15%+, weak staining  PR - HER2- Ki67 10%       12/29/2016 Initial Diagnosis    Malignant neoplasm of upper-outer quadrant of left breast in female, estrogen receptor positive ([place])      01/13/2017 Echocardiogram    ECHO 01/13/17 Study Conclusions - Left ventricle: The cavity size was normal. Systolic function was   normal. The estimated ejection fraction was in the range of 60%   to 65%. Wall motion was normal; there were no regional wall   motion abnormalities. Left ventricular diastolic function   parameters were normal. - Mitral valve: There was trivial regurgitation. - Right atrium: The atrium was mildly dilated. - Tricuspid valve: There was trivial regurgitation.       01/14/2017 Surgery    Port placement by Dr. Donne Hazel       01/15/2017 Imaging    Whole Body bone Scan 01/15/17 IMPRESSION: 1.  No significant abnormality identified.      01/15/2017 Imaging    CT CAP W Contrast 01/15/17 IMPRESSION: 1. Left lateral breast mass with pathologic left axillary adenopathy including a 2.0 cm left axillary lymph node. 2. Several hypodense lesions in the liver are likely benign lesions such as cysts. However, these are technically too small to characterize by CT. Possibilities for further workup include surveillance or hepatic protocol MRI with and without contrast. 3. There is also a tiny hypodense lesion in the spleen which is technically nonspecific but statistically highly likely to be benign. 4.  Prominent stool throughout the colon favors constipation      01/16/2017 - 06/05/2017 Chemotherapy    Neoadjuvant chemo AC every 2 weeks for 4  cycles starting on 01/16/2017 and ended 02/27/17. Then proceed with weekly taxol for 12 treatments starting 03/13/17. Added Carboplatin AUC 2 on 04/03/17 to weekly taxol. Due to symptomatic thrombocytopenia we held Carbo since cycle 8. Cycle 9 was post-poned due to neutropenia and Granix was added starting 05/12/17. Added carbo  back at reduced dose AUC 1.5 with cycle 10 on 05/22/17. 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   Analysis also detected a Variant of Uncertain Significance (VUS) in the NBN gene called c.628G>T (p.Val210Phe)      06/12/2017 Imaging    MR BREAST BILATERAL WO CONTRAST IMPRESSION: 1. Significant positive response to neoadjuvant chemotherapy. The index carcinoma in the left breast has significantly decreased in size. Left axillary adenopathy has also significantly improved. 2. No other evidence of malignancy.      07/16/2017 Surgery    Pt underwent a left mastectomy with Dr. Donne Hazel      07/16/2017 Pathology Results    Diagnosis 07/16/17 1. Breast, simple mastectomy, Right Prophylactic - FIBROCYSTIC CHANGES INCLUDING APOCRINE METAPLASIA - DUCT ECTASIA - CALCIFICATIONS - NO MALIGNANCY IDENTIFIED 2. Lymph node, sentinel, biopsy, Left Axillary - NO CARCINOMA IDENTIFIED IN ONE LYMPH NODE (0/1) - SEE COMMENT 3. Lymph node, sentinel, biopsy, Left - NO CARCINOMA IDENTIFIED IN ONE LYMPH NODE (0/1) - SEE COMMENT 4. Breast, simple mastectomy, Left - INVASIVE DUCTAL CARCINOMA, NOTTINGHAM GRADE 2 OF 3, 1.3 CM - MARGINS UNINVOLVED BY CARCINOMA (2 CM POSTERIOR MARGIN) - PREVIOUS BIOPSY SITE CHANGES - CALCIFICATIONS - SEE ONCOLOGY TABLE BELOW      07/16/2017 Receptors her2    Results: IMMUNOHISTOCHEMICAL AND MORPHOMETRIC ANALYSIS PERFORMED MANUALLY Estrogen Receptor: 0%, NEGATIVE Progesterone Receptor: 0%, NEGATIVE Results: HER2 - NEGATIVE RATIO OF HER2/CEP17 SIGNALS 1.73 AVERAGE HER2 COPY NUMBER PER CELL 2.67        HISTORY OF PRESENTING ILLNESS: 01/07/17 Kathryn Stanley 42 y.o. female is here because of newly diagnosed Malignant neoplasm of upper-outer quadrant of left breast in female, estrogen receptor positive. She presents to the breast clinic  today with her common law husband. She felt the lump herself 3 months ago. She felt pain like "stabbing" first. She then had a mammogram 12/25/16 and subsequent biopsy   In the past she was using smoking match and quit smoking "cold [place]" that was 1 month ago.   Today she reports her breast pain still comes and goes. She has not noticed any new changes. 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 Lately has regular period. She has a weak cervix and her children were born premature. She is currently working at [employer] and works on her feet all the time, working 5 days a week.   GYN HISTORY  Menarchal: 10 LMP: 12/10/16 Contraceptive: No HRT: NA GP: G1P2 - twins    CURRENT THERAPY:  Adjuvant radiation therapy with Dr Lisbeth Willits starting on 09/02/17, plan to complete 1st week of May  INTERVAL HISTORY:  Kathryn Stanley is here for a follow-up and to discuss adjuvant treatment options. She presents to the clinic with her husband. Since last visit, she began radiation therapy with Dr. Lisbeth Constancio on 09/02/17. She reports she is tolerating radiation well. She was able to go back to work. She is still recovering from surgery and has some scabbing.  She notes to have swelling of her left ankle at intermittent times and bleeding from her toenails. She still has a rash on her face and she is using hydrocortisone cream TID on her face.   On review of systems, pt denies swelling of arms, or any other complaints at this time. Pertinent positives are listed and detailed within the above HPI.   MEDICAL HISTORY:  Past Medical History:  Diagnosis Date  . Breast cancer, left breast ([place]) dx'd 01/2017  . Dermatologic problem    possible psoriasis. Pt has not had been diagnosed by dermatology.  . 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 GERD (gastroesophageal reflux disease)   .  Tobacco dependence    trying to quit with nicotene patches    SURGICAL HISTORY: Past Surgical History:  Procedure Laterality Date  . BREAST BIOPSY Left 12/2016  . BREAST RECONSTRUCTION Bilateral 07/16/2017   BILATERAL BREAST RECONSTRUCTION WITH PLACEMENT OF TISSUE EXPANDER AND ALLODERM/notes 07/16/2017  . CESAREAN SECTION  2002  . MASTECTOMY Bilateral 07/16/2017   LEFT SKIN SPARING MASTECTOMY WITH LEFT RADIOACTIVE SEED TARGETED AXILLARY LYMPH NODE EXCISION AND AXILLARY SENTINEL LYMPH NODE BIOPSY; RIGHT PROPHYLACTIC SKIN SPARING MASTECTOMY [name] 07/16/2017  . MASTECTOMY WITH RADIOACTIVE SEED GUIDED EXCISION AND AXILLARY SENTINEL LYMPH NODE BIOPSY Left 07/16/2017   Procedure: LEFT SKIN SPARING MASTECTOMY WITH LEFT RADIOACTIVE SEED TARGETED AXILLARY LYMPH NODE EXCISION AND AXILLARY SENTINEL LYMPH NODE BIOPSY;  Surgeon: [name], MD;  Location: [facility];  Service: General;  Laterality: Left;  . NIPPLE SPARING MASTECTOMY Right 07/16/2017   Procedure: RIGHT PROPHYLACTIC SKIN SPARING MASTECTOMY;  Surgeon: [name], MD;  Location: [facility];  Service: General;  Laterality: Right;  . PORT-A-CATH REMOVAL N/A 07/16/2017   Procedure: REMOVAL PORT-A-CATH;  Surgeon: [name], MD;  Location: [facility];  Service: General;  Laterality: N/A;  . PORTA CATH REMOVAL  07/16/2017  . PORTACATH PLACEMENT N/A 01/14/2017   Procedure: INSERTION PORT-A-CATH WITH [place];  Surgeon: [name], MD;  Location: [facility];  Service: General;  Laterality: N/A;    SOCIAL HISTORY: Social History   Socioeconomic History  . Marital status: Married    Spouse name: Not on file  . Number of children: Not on file  . Years of education: Not on file  . Highest education level: Not on file  Occupational History  . Not on file  Social Needs  . Financial resource strain: Not on file  . Food insecurity:    Worry: Not on file    Inability: Not on file  . Transportation needs:    Medical: Not on file    Non-medical:  Not on file  Tobacco Use  . Smoking status: Former Smoker    Packs/day: 1.00    Years: 17.00    Pack years: 17.00    Types: Cigarettes    Last attempt to quit: 12/15/2016    Years since quitting: 0.7  . Smokeless tobacco: Never Used  Substance and Sexual Activity  . Alcohol use: No  . Drug use: No  . Sexual activity: Yes    Birth control/protection: None  Lifestyle  . Physical activity:    Days per week: Not on file    Minutes per session: Not on file  . Stress: Not on file  Relationships  . Social connections:    Talks on phone: Not on file    Gets together: Not on file    Attends religious  service: Not on file    Active member of club or organization: Not on file    Attends meetings of clubs or organizations: Not on file    Relationship status: Not on file  . Intimate partner violence:    Fear of current or ex partner: Not on file    Emotionally abused: Not on file    Physically abused: Not on file    Forced sexual activity: Not on file  Other Topics Concern  . Not on file  Social History Narrative  . Not on file    FAMILY HISTORY: Family History  Problem Relation Age of Onset  . Stroke Maternal Grandmother   . Stroke Father   . Hypertension Father   . Other Father        Adopted    ALLERGIES:  is allergic to amoxicillin; chlorhexidine gluconate; and nicotine.  MEDICATIONS:  Current Outpatient Medications  Medication Sig Dispense Refill  . famotidine (PEPCID) 10 MG tablet Take 10 mg by mouth daily as needed for heartburn or indigestion.    Marland Kitchen ibuprofen (ADVIL,MOTRIN) 200 MG tablet Take 200-400 mg by mouth daily as needed for headache or moderate pain.     No current facility-administered medications for this visit.    REVIEW OF SYSTEMS:  Constitutional: Denies abnormal night sweats. (+) hair growing  Eyes: Denies blurriness of vision, double vision or watery eyes Ears, nose, mouth, throat, and face: Denies mucositis or sore throat Respiratory: Denies  dyspnea or wheezes   Cardiovascular: Denies palpitation, chest discomfort (+) persistent lower extremity swelling Gastrointestinal:  Denies nausea, heartburn.  Skin: Denies abnormal skin rashes Lymphatics: Denies new lymphadenopathy or easy bruising Neurological:Denies numbness, tingling or new weaknesses MSK: negative Breast: (+) Status post bilateral mastectomy with tissue expander, incisions are healing well without discharge or surrounding skin erythema Behavioral/Psych: Mood is stable, no new changes  All other systems were reviewed with the patient and are negative.  PHYSICAL EXAMINATION:  ECOG PERFORMANCE STATUS: 1  Vitals:   09/07/17 1003  BP: 123/69  Pulse: 67  Resp: 18  Temp: 97.6 F (36.4 C)  SpO2: 100%   Filed Weights   09/07/17 1003  Weight: 200 lb 3.2 oz (90.8 kg)     GENERAL:alert, no distress and comfortable SKIN: skin color, texture, turgor are normal, no rashes or significant lesions EYES: normal, conjunctiva are pink and non-injected, sclera clear OROPHARYNX:no exudate, no erythema and lips, buccal mucosa, and tongue normal  NECK: supple, thyroid normal size, non-tender, without nodularity LYMPH:  no palpable lymphadenopathy in the cervical, axillary or inguinal LUNGS: clear to auscultation and percussion with normal breathing effort HEART: regular rate & rhythm and no murmurs and no lower extremity edema ABDOMEN:abdomen soft, non-tender and normal bowel sounds Musculoskeletal:no cyanosis of digits and no clubbing  PSYCH: alert & oriented x 3 with fluent speech NEURO: no focal motor/sensory deficits Breast: Breast exam deferred today  LABORATORY DATA:  I have reviewed the data as listed CBC Latest Ref Rng & Units 09/07/2017 08/03/2017 07/09/2017  WBC 3.9 - 10.3 K/uL 4.5 4.1 4.8  Hemoglobin 11.6 - 15.9 g/dL 13.1 12.3 13.1  Hematocrit 34.8 - 46.6 % 39.3 37.1 38.9  Platelets 145 - 400 K/uL 199 199 193    CMP Latest Ref Rng & Units 09/07/2017 08/03/2017  06/23/2017  Glucose 70 - 140 mg/dL 88 130 93  BUN 7 - 26 mg/dL 14 23 18   Creatinine 0.60 - 1.10 mg/dL 0.92 0.85 0.83  Sodium 136 - 145 mmol/L  140 143 139  Potassium 3.5 - 5.1 mmol/L 4.1 3.8 3.7  Chloride 98 - 109 mmol/L 107 107 107  CO2 22 - 29 mmol/L 26 25 24   Calcium 8.4 - 10.4 mg/dL 9.4 9.5 9.1  Total Protein 6.4 - 8.3 g/dL 6.9 6.8 6.6  Total Bilirubin 0.2 - 1.2 mg/dL 0.2 0.3 0.4  Alkaline Phos 40 - 150 U/L 80 80 71  AST 5 - 34 U/L 32 26 54(H)  ALT 0 - 55 U/L 63(H) 51 90(H)     PATHOLOGY  Diagnosis 12/29/16 1. Breast, left, needle core biopsy, stereotactic, upper inner quadrant - FIBROCYSTIC CHANGES INCLUDING APOCRINE METAPLASIA WITH CALCIFICATIONS - NO MALIGNANCY IDENTIFIED 2. Breast, left, needle core biopsy, ultrasound, 1:30 o'clock - INVASIVE MAMMARY CARCINOMA - SEE COMMENT 3. Lymph node, needle/core biopsy, ultrasound, left axillary - METASTIC CARCINOMA INVOLVING ONE LYMPH NODE (1/1) Microscopic Comment 2. The biopsy material shows an infiltrative proliferation of cells with large vesicular nuclei with conspicuous nucleoli, arranged linearly and in small clusters. There are scattered cells with marked pleomorphism and cytologic atypia. Based on the biopsy, the carcinoma appears Nottingham grade 3 of 3 and measures 0.9 cm in greatest linear extent. E-cadherin and prognostic markers (ER/PR/ki-67/HER2-FISH)are pending and will be reported in an addendum. Dr. Tresa Moore reviewed the case and agrees with the above diagnosis. This case was called to [facility] on December 30, 2016. Results: IMMUNOHISTOCHEMICAL AND MORPHOMETRIC ANALYSIS PERFORMED MANUALLY Estrogen Receptor: 15%, POSITIVE, WEAK STAINING INTENSITY Progesterone Receptor: 0%, NEGATIVE Proliferation Marker Ki67: 10% COMMENT: The negative hormone receptor study(ies) in this case has An internal positive control. Results: HER2 - NEGATIVE RATIO OF HER2/CEP17 SIGNALS 1.63 AVERAGE HER2 COPY NUMBER PER  CELL 3.10  Diagnosis 07/16/17 1. Breast, simple mastectomy, Right Prophylactic - FIBROCYSTIC CHANGES INCLUDING APOCRINE METAPLASIA - DUCT ECTASIA - CALCIFICATIONS - NO MALIGNANCY IDENTIFIED 2. Lymph node, sentinel, biopsy, Left Axillary - NO CARCINOMA IDENTIFIED IN ONE LYMPH NODE (0/1) - SEE COMMENT 3. Lymph node, sentinel, biopsy, Left - NO CARCINOMA IDENTIFIED IN ONE LYMPH NODE (0/1) - SEE COMMENT 4. Breast, simple mastectomy, Left - INVASIVE DUCTAL CARCINOMA, NOTTINGHAM GRADE 2 OF 3, 1.3 CM - MARGINS UNINVOLVED BY CARCINOMA (2 CM POSTERIOR MARGIN) - PREVIOUS BIOPSY SITE CHANGES - CALCIFICATIONS - SEE ONCOLOGY TABLE BELOW Microscopic Comment 2. and 3. Cytokeratin AE1/3 was performed on the sentinel lymph nodes to exclude micrometastasis. There is no evidence of metastatic carcinoma by immunohistochemistry. 4. BREAST, STATUS POST NEOADJUVANT TREATMENT Procedure: Total mastectomy Laterality: Left Tumor Size: 1.3 cm (glass slide measurement) Histologic Type: Invasive carcinoma of no special type (ductal, not otherwise specified) Grade: Nottingham Grade 2 Tubular Differentiation: 3 Nuclear Pleomorphism: 3 Mitotic Count: 1 Ductal Carcinoma in Situ (DCIS): Not identified Regional Lymph Nodes: Number of Lymph Nodes Examined: 2 Number of Sentinel Lymph Nodes Examined: 2 Lymph Nodes with Macrometastases: 0 Lymph Nodes with Micrometastases: 0 2 of 5 FINAL for PAIDYN, MCFERRAN ([id number]) Microscopic Comment(continued) Lymph Nodes with Isolated Tumor Cells: 0 Margins: Uninvolved by invasive carcinoma Invasive carcinoma, distance from closest margin: 2 cm (posterior/deep) DCIS, distance from closest margin: N/A Breast Prognostic Profile (pre-neoadjuvant case #: [id number]) Estrogen Receptor: Positive (15%, weak) Progesterone Receptor: Negative Her2: Negative (Ratio 1.63) Ki-67: 10% Will be repeated on the current case (Block #: [id number]) and the results reported  separately. Residual Cancer Burden (RCB): Primary Tumor Bed: 13 mm x 10 mm Overall Cancer Cellularity: 20% Percentage of Cancer that is in Situ: N/A Number of Positive Lymph Nodes: 0  Diameter of Largest Lymph Node metastasis: N/A Residual Cancer Burden : 1.611 Residual Cancer Burden Class: RCB-II Pathologic Stage Classification (p TNM, AJCC 8th Edition): Primary Tumor: ypT1c Regional Lymph Nodes: ypN0 COMMENT: E-cadherin immunohistochemistry was performed on the biopsy material and was positive supporting a ductal origin. Results: IMMUNOHISTOCHEMICAL AND MORPHOMETRIC ANALYSIS PERFORMED MANUALLY Estrogen Receptor: 0%, NEGATIVE Progesterone Receptor: 0%, NEGATIVE Results: HER2 - NEGATIVE RATIO OF HER2/CEP17 SIGNALS 1.73 AVERAGE HER2 COPY NUMBER PER CELL 2.67  PROCEDURES  ECHO 01/13/17 Study Conclusions - Left ventricle: The cavity size was normal. Systolic function was   normal. The estimated ejection fraction was in the range of 60%   to 65%. Wall motion was normal; there were no regional wall   motion abnormalities. Left ventricular diastolic function   parameters were normal. - Mitral valve: There was trivial regurgitation. - Right atrium: The atrium was mildly dilated. - Tricuspid valve: There was trivial regurgitation.  RADIOGRAPHIC STUDIES: I have personally reviewed the radiological images as listed and agreed with the findings in the report. No results found. ASSESSMENT & PLAN:  Kathryn Stanley is a 42 y.o. premenopausal female with no significant past medical history, presented with a palpable left breast mass.  1. Malignant neoplasm of upper-outer quadrant of left breast in female, ductal carcinoma, cT2N1M0, Stage IIIa, ER weakly +, PR - , HER2 - , Grade 3, ypT1cN0M0, triple negative  -We reviewed her mammogram and initial biopsy results in details with patient and her husband.  -Her tumor is weakly ER positive, PR negative, HER-2 negative, grade 3, she has positive  lymph nodes, those are all high risks for recurrence. I recommend neoadjuvant or adjuvant chemotherapy to reduce her risk of recurrence after surgery. -We discussed that the benefit of neoadjuvant chemotherapy, to shrink the tumor and lymph nodes, and potentially she would be a candidate for lumpectomy and may not need lymph node dissection if she has good response. I have discussed this with her surgeon Dr. Donne Hazel, both of [place] recommend her to consider neoadjuvant chemotherapy. She agrees.  -we reviewed her staging scan CT from 01/15/17 in person. A few hypodense lesion were seen in the liver, appear to be benign cysts. CT and bone scan otherwise negative for distant metastasis. 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 This was discussed with patient. -Patient underwent neoadjuvant chemotherapy with dose dense AC every 2 weeks for 4 cycles, and weekly carbo and Taxol for 12 weeks -Pt underwent a bilateral mastectomy with Dr. Donne Hazel on 07/16/17. Her pathology results reveal a 1.3 tumor removed with all clean margins. There were no positive lymph nodes for carcinoma. Her receptors indicate triple negative. -Due to her triple negative disease, residual 1.3cm tumor and its agressiveness in nature, I recommend adjuvant chemo Xeloda for 4 months. I gave reading material on this and discussed benefits and side effects in details. She is interested, and I plan to start after she completes RT and BSO  -I encourage her to consider clinical trial for immunotherapy Keytruda in triple negative cancer. I discussed side effects and benefits. She is not sure if she wants to do it but is willing to talk to [place] again today.  -her tumor was weakly ER positive on initial biopsy, and became triple negative after neoadjuvant chemotherapy.  I think the benefit of antiestrogen therapy is small.  She is planned to have bilateral oophorectomy in the near future due to positive BRCA1 mutation. - I  previously discussed that Xeloda may make  the the side effects of radiation worse. So we will start after she completes RT and BSO. Plan to see her back 1st week of May to discuss this again and start in June after BSO.. -Pt has scheduled her BSO for 11/03/17 and breast reconstruction in about 6 months after completing radiation.  She began radiation with Dr. Lisbeth Cuartas on 09/02/17 and plans to complete 1st week of May 2019 -Labs today (09/07/17) are otherwise normal except for ALT at 63. -F/u in 5 weeks   2. Genetics: BRCA1 + -Due to her young age of diagnosis I suggested she get genetic testing. 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  -She is agreeable with bilateral oophorectomy and mastectomy. She underwent mastectomy with Dr. Donne Hazel on 07/16/17 and will have  Reconstruction with plastic surgeon Dr. Ashley Mariner. Total hysterectomy and BSO is scheduled with Dr. Denman George for 11/03/17.  -I previously explained when her children get older they need to get tested and start cancer screenings earlier.   3. Financial Support -They are not insured and are attempting to get an orange card.  -They have met our financial support/social worker  4. Transmantitis  -05/22/17 showed increase in AST at 44 and ALT at 102, likely secondary to chemotherapy -resolved now after she completed chemo, AST slightly elevated again today, will continue monitoring.  5. Facial rash  -She has had a rash on her face since completing chemo. She also has mild skin peeling. She has been using hydrocortisone cream TID. She has not been able to find a dermatologist that accepts medicaid. -I recommend her to see her PCP, Burley Saver at internal [facility].  -I also recommend that she only use the topical steroid twice daily,  and try oral allergy medication such as Claritin   6. Left ankle swelling -She has swelling onset 2 months ago that is intermittent. It does not hurt when you apply pressure but it bothers her when she walks.    -I will order a Doppler to rule out DVT. It came back negative   7. Paronychia -She has had discharge coming from her left great toenail onset 1 month ago. I advised her to soak her feet in warm vinegar and use topical neosporin. There is no erythema or warmth, will hold off of oral antibiosis at this time.    PLAN:  -she will continue radiation, plan to complete 5/10 -Research nurse to met with her during today's visit to discuss the Keytruda trial  -F/u in 5 weeks to discuss adjuvant chemo Xeloda and immunotherapy clinical trial again.  -BSO scheduled on 11/03/17 -Plan to start Xeloda early June after BSO  -See PCP about face rash -Doppler ordered today to rule out LLE DVT  All questions were answered. The patient knows to call the clinic with any problems, questions or concerns. I spent 20 minutes counseling the patient face to face. The total time spent in the appointment was 25 minutes and more than 50% was on counseling.  This document serves as a record of services personally performed by [name], MD. It was created on her behalf by Theresia Bough, a trained medical scribe. The creation of this record is based on the scribe's personal observations and the provider's statements to them.   I have reviewed the above documentation for accuracy and completeness, and I  agree with the above.   [name]  09/07/2017

## 2017-09-07 ENCOUNTER — Ambulatory Visit
Admission: RE | Admit: 2017-09-07 | Discharge: 2017-09-07 | Disposition: A | Discharge status: Home / Self Care | Payer: Medicaid Other | Source: Ambulatory Visit | Attending: Radiation Oncology | Admitting: Radiation Oncology

## 2017-09-07 ENCOUNTER — Telehealth: Payer: Self-pay | Admitting: *Deleted

## 2017-09-07 ENCOUNTER — Ambulatory Visit: Payer: Medicaid Other

## 2017-09-07 ENCOUNTER — Inpatient Hospital Stay: Payer: Medicaid Other | Attending: Hematology | Admitting: Hematology

## 2017-09-07 ENCOUNTER — Inpatient Hospital Stay: Payer: Medicaid Other

## 2017-09-07 ENCOUNTER — Ambulatory Visit (HOSPITAL_COMMUNITY)
Admission: RE | Admit: 2017-09-07 | Discharge: 2017-09-07 | Disposition: A | Discharge status: Home / Self Care | Payer: Medicaid Other | Source: Ambulatory Visit | Attending: Hematology | Admitting: Hematology

## 2017-09-07 ENCOUNTER — Telehealth: Payer: Self-pay

## 2017-09-07 ENCOUNTER — Encounter: Payer: Self-pay | Admitting: Medical Oncology

## 2017-09-07 ENCOUNTER — Encounter: Payer: Self-pay | Admitting: Hematology

## 2017-09-07 VITALS — BP 123/69 | HR 67 | Temp 97.6°F | Resp 18 | Ht 64.0 in | Wt 200.2 lb

## 2017-09-07 DIAGNOSIS — Z170 Estrogen receptor positive status [ER+]: Secondary | ICD-10-CM | POA: Diagnosis not present

## 2017-09-07 DIAGNOSIS — R232 Flushing: Secondary | ICD-10-CM | POA: Insufficient documentation

## 2017-09-07 DIAGNOSIS — R21 Rash and other nonspecific skin eruption: Secondary | ICD-10-CM | POA: Insufficient documentation

## 2017-09-07 DIAGNOSIS — Z510 Encounter for antineoplastic radiation therapy: Secondary | ICD-10-CM | POA: Insufficient documentation

## 2017-09-07 DIAGNOSIS — Z9011 Acquired absence of right breast and nipple: Secondary | ICD-10-CM | POA: Diagnosis not present

## 2017-09-07 DIAGNOSIS — Z1379 Encounter for other screening for genetic and chromosomal anomalies: Secondary | ICD-10-CM

## 2017-09-07 DIAGNOSIS — R948 Abnormal results of function studies of other organs and systems: Secondary | ICD-10-CM | POA: Diagnosis not present

## 2017-09-07 DIAGNOSIS — Z1501 Genetic susceptibility to malignant neoplasm of breast: Secondary | ICD-10-CM

## 2017-09-07 DIAGNOSIS — Z923 Personal history of irradiation: Secondary | ICD-10-CM | POA: Insufficient documentation

## 2017-09-07 DIAGNOSIS — L03032 Cellulitis of left toe: Secondary | ICD-10-CM | POA: Diagnosis not present

## 2017-09-07 DIAGNOSIS — R635 Abnormal weight gain: Secondary | ICD-10-CM | POA: Insufficient documentation

## 2017-09-07 DIAGNOSIS — F39 Unspecified mood [affective] disorder: Secondary | ICD-10-CM | POA: Insufficient documentation

## 2017-09-07 DIAGNOSIS — K219 Gastro-esophageal reflux disease without esophagitis: Secondary | ICD-10-CM | POA: Insufficient documentation

## 2017-09-07 DIAGNOSIS — N3000 Acute cystitis without hematuria: Secondary | ICD-10-CM | POA: Diagnosis not present

## 2017-09-07 DIAGNOSIS — R740 Nonspecific elevation of levels of transaminase and lactic acid dehydrogenase [LDH]: Secondary | ICD-10-CM | POA: Diagnosis not present

## 2017-09-07 DIAGNOSIS — Z9221 Personal history of antineoplastic chemotherapy: Secondary | ICD-10-CM | POA: Insufficient documentation

## 2017-09-07 DIAGNOSIS — Z171 Estrogen receptor negative status [ER-]: Secondary | ICD-10-CM | POA: Insufficient documentation

## 2017-09-07 DIAGNOSIS — F17210 Nicotine dependence, cigarettes, uncomplicated: Secondary | ICD-10-CM | POA: Diagnosis not present

## 2017-09-07 DIAGNOSIS — C773 Secondary and unspecified malignant neoplasm of axilla and upper limb lymph nodes: Secondary | ICD-10-CM | POA: Diagnosis not present

## 2017-09-07 DIAGNOSIS — R600 Localized edema: Secondary | ICD-10-CM | POA: Insufficient documentation

## 2017-09-07 DIAGNOSIS — C50412 Malignant neoplasm of upper-outer quadrant of left female breast: Secondary | ICD-10-CM | POA: Diagnosis present

## 2017-09-07 DIAGNOSIS — M7989 Other specified soft tissue disorders: Secondary | ICD-10-CM | POA: Insufficient documentation

## 2017-09-07 DIAGNOSIS — Z95828 Presence of other vascular implants and grafts: Secondary | ICD-10-CM

## 2017-09-07 DIAGNOSIS — Z1509 Genetic susceptibility to other malignant neoplasm: Secondary | ICD-10-CM

## 2017-09-07 LAB — CBC WITH DIFFERENTIAL/PLATELET
BASOS ABS: 0 10*3/uL (ref 0.0–0.1)
Basophils Relative: 0 %
Eosinophils Absolute: 0.1 10*3/uL (ref 0.0–0.5)
Eosinophils Relative: 2 %
HEMATOCRIT: 39.3 % (ref 34.8–46.6)
Hemoglobin: 13.1 g/dL (ref 11.6–15.9)
LYMPHS ABS: 1.3 10*3/uL (ref 0.9–3.3)
LYMPHS PCT: 28 %
MCH: 28.1 pg (ref 25.1–34.0)
MCHC: 33.3 g/dL (ref 31.5–36.0)
MCV: 84.3 fL (ref 79.5–101.0)
MONO ABS: 0.5 10*3/uL (ref 0.1–0.9)
MONOS PCT: 11 %
NEUTROS PCT: 59 %
Neutro Abs: 2.7 10*3/uL (ref 1.5–6.5)
Platelets: 199 10*3/uL (ref 145–400)
RBC: 4.66 MIL/uL (ref 3.70–5.45)
RDW: 12.9 % (ref 11.2–14.5)
WBC: 4.5 10*3/uL (ref 3.9–10.3)

## 2017-09-07 LAB — COMPREHENSIVE METABOLIC PANEL
ALT: 63 U/L — ABNORMAL HIGH (ref 0–55)
AST: 32 U/L (ref 5–34)
Albumin: 3.7 g/dL (ref 3.5–5.0)
Alkaline Phosphatase: 80 U/L (ref 40–150)
Anion gap: 7 (ref 3–11)
BILIRUBIN TOTAL: 0.2 mg/dL (ref 0.2–1.2)
BUN: 14 mg/dL (ref 7–26)
CO2: 26 mmol/L (ref 22–29)
CREATININE: 0.92 mg/dL (ref 0.60–1.10)
Calcium: 9.4 mg/dL (ref 8.4–10.4)
Chloride: 107 mmol/L (ref 98–109)
GFR calc Af Amer: >60 mL/min (ref >60)
Glucose, Bld: 88 mg/dL (ref 70–140)
POTASSIUM: 4.1 mmol/L (ref 3.5–5.1)
Sodium: 140 mmol/L (ref 136–145)
TOTAL PROTEIN: 6.9 g/dL (ref 6.4–8.3)

## 2017-09-07 NOTE — Telephone Encounter (Signed)
Received call from [facility] @ Vascular Lab re:  Pt doppler study of Left Lower Extremity results -  Negative for DVT.   Pt was sent home after procedure.

## 2017-09-07 NOTE — Progress Notes (Signed)
LLE venous duplex prelim: negative for DVT. Landry Mellow, RDMS, RVT Attempted call report to Dr. Burr Medico. Left voice message on nurse line.

## 2017-09-07 NOTE — Telephone Encounter (Signed)
Printed avs and calender of upcoming appointment. Unable to reach Vascular dept. Gave patient the number. Per 4/1 los

## 2017-09-08 ENCOUNTER — Ambulatory Visit
Admission: RE | Admit: 2017-09-08 | Discharge: 2017-09-08 | Disposition: A | Discharge status: Home / Self Care | Payer: Medicaid Other | Source: Ambulatory Visit | Attending: Radiation Oncology | Admitting: Radiation Oncology

## 2017-09-08 ENCOUNTER — Ambulatory Visit: Payer: Medicaid Other

## 2017-09-08 DIAGNOSIS — Z510 Encounter for antineoplastic radiation therapy: Secondary | ICD-10-CM | POA: Diagnosis not present

## 2017-09-09 ENCOUNTER — Ambulatory Visit: Payer: Medicaid Other

## 2017-09-09 ENCOUNTER — Ambulatory Visit
Admission: RE | Admit: 2017-09-09 | Discharge: 2017-09-09 | Disposition: A | Discharge status: Home / Self Care | Payer: Medicaid Other | Source: Ambulatory Visit | Attending: Radiation Oncology | Admitting: Radiation Oncology

## 2017-09-09 DIAGNOSIS — Z510 Encounter for antineoplastic radiation therapy: Secondary | ICD-10-CM | POA: Diagnosis not present

## 2017-09-10 ENCOUNTER — Encounter: Payer: Self-pay | Admitting: Physical Therapy

## 2017-09-10 ENCOUNTER — Ambulatory Visit
Admission: RE | Admit: 2017-09-10 | Discharge: 2017-09-10 | Disposition: A | Discharge status: Home / Self Care | Payer: Medicaid Other | Source: Ambulatory Visit | Attending: Radiation Oncology | Admitting: Radiation Oncology

## 2017-09-10 ENCOUNTER — Ambulatory Visit: Payer: Medicaid Other

## 2017-09-10 ENCOUNTER — Ambulatory Visit: Payer: Medicaid Other | Attending: Plastic Surgery | Admitting: Physical Therapy

## 2017-09-10 DIAGNOSIS — R293 Abnormal posture: Secondary | ICD-10-CM | POA: Insufficient documentation

## 2017-09-10 DIAGNOSIS — C50412 Malignant neoplasm of upper-outer quadrant of left female breast: Secondary | ICD-10-CM | POA: Diagnosis present

## 2017-09-10 DIAGNOSIS — M25611 Stiffness of right shoulder, not elsewhere classified: Secondary | ICD-10-CM | POA: Diagnosis present

## 2017-09-10 DIAGNOSIS — M6281 Muscle weakness (generalized): Secondary | ICD-10-CM | POA: Insufficient documentation

## 2017-09-10 DIAGNOSIS — Z170 Estrogen receptor positive status [ER+]: Secondary | ICD-10-CM | POA: Diagnosis present

## 2017-09-10 DIAGNOSIS — Z510 Encounter for antineoplastic radiation therapy: Secondary | ICD-10-CM | POA: Diagnosis not present

## 2017-09-10 DIAGNOSIS — M25612 Stiffness of left shoulder, not elsewhere classified: Secondary | ICD-10-CM | POA: Diagnosis present

## 2017-09-10 NOTE — Therapy (Signed)
[place], [place], [address] Phone: [phone]   Fax:  [phone]  Physical Therapy Treatment  Patient Details  Name: Kathryn Stanley MRN: [id number] Date of Birth: Aug 01, 1975 Referring Provider: Dr. Iran Planas    Encounter Date: 09/10/2017  PT End of Session - 09/10/17 1550    Visit Number  2    Number of Visits  4    Date for PT Re-Evaluation  09/25/17    Authorization Type  Medicaid     PT Start Time  1519    PT Stop Time  1601    PT Time Calculation (min)  42 min    Activity Tolerance  Patient tolerated treatment well    Behavior During Therapy  [facility] for tasks assessed/performed       Past Medical History:  Diagnosis Date  . Breast cancer, left breast ([place]) dx'd 01/2017  . Dermatologic problem    possible psoriasis. Pt has not had been diagnosed by dermatology.  . 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 GERD (gastroesophageal reflux disease)   . Tobacco dependence    trying to quit with nicotene patches    Past Surgical History:  Procedure Laterality Date  . BREAST BIOPSY Left 12/2016  . BREAST RECONSTRUCTION Bilateral 07/16/2017   BILATERAL BREAST RECONSTRUCTION WITH PLACEMENT OF TISSUE EXPANDER AND ALLODERM/notes 07/16/2017  . CESAREAN SECTION  2002  . MASTECTOMY Bilateral 07/16/2017   LEFT SKIN SPARING MASTECTOMY WITH LEFT RADIOACTIVE SEED TARGETED AXILLARY LYMPH NODE EXCISION AND AXILLARY SENTINEL LYMPH NODE BIOPSY; RIGHT PROPHYLACTIC SKIN SPARING MASTECTOMY [name] 07/16/2017  . MASTECTOMY WITH RADIOACTIVE SEED GUIDED EXCISION AND AXILLARY SENTINEL LYMPH NODE BIOPSY Left 07/16/2017   Procedure: LEFT SKIN SPARING MASTECTOMY WITH LEFT RADIOACTIVE SEED TARGETED AXILLARY LYMPH NODE EXCISION AND AXILLARY SENTINEL LYMPH NODE BIOPSY;  Surgeon: [name], MD;  Location: [place];  Service: General;  Laterality: Left;  .  NIPPLE SPARING MASTECTOMY Right 07/16/2017   Procedure: RIGHT PROPHYLACTIC SKIN SPARING MASTECTOMY;  Surgeon: [name], MD;  Location: [place];  Service: General;  Laterality: Right;  . PORT-A-CATH REMOVAL N/A 07/16/2017   Procedure: REMOVAL PORT-A-CATH;  Surgeon: [name], MD;  Location: [place];  Service: General;  Laterality: N/A;  . PORTA CATH REMOVAL  07/16/2017  . PORTACATH PLACEMENT N/A 01/14/2017   Procedure: INSERTION PORT-A-CATH WITH [place];  Surgeon: [name], MD;  Location: [place];  Service: General;  Laterality: N/A;    There were no vitals filed for this visit.  Subjective Assessment - 09/10/17 1521    Subjective  My shoulders feel good today. I started exercising. I gained 20 lb during chemo and I am trying to get it off. I have been walking and running at least 150 min a week.     Pertinent History  Diagnosed in August 2018  on Feb 7,   bilateral mastectomy with 3 nodes removed on the left and immediate bilateral expanders, had chemo first  and will have radiatoin that starts next week     Patient Stated Goals  want to know what kind of exercises she can do and when she can start doing what she used to do before at work     Currently in Pain?  No/denies         [facility] PT Assessment - 09/10/17 0001      AROM   Right Shoulder Flexion  158 Degrees  Right Shoulder ABduction  160 Degrees    Left Shoulder Flexion  160 Degrees    Left Shoulder ABduction  176 Degrees                   [facility] Adult PT Treatment/Exercise - 09/10/17 0001      Shoulder Exercises: Supine   Horizontal ABduction  Strengthening;Both;10 reps;Theraband    Theraband Level (Shoulder Horizontal ABduction)  Level 2 (Red)    External Rotation  Strengthening;Both;10 reps;Theraband    Theraband Level (Shoulder External Rotation)  Level 2 (Red)    Flexion  Strengthening;Both;10 reps;Theraband narrow and wide grip    Theraband Level (Shoulder Flexion)  Level 2 (Red)    Other  Supine Exercises  D2 x 10 reps bilaterally with red band      Shoulder Exercises: Pulleys   Flexion  2 minutes therapist demonstrated and pt returned demonstrated    ABduction  2 minutes      Shoulder Exercises: Therapy Ball   Flexion  10 reps with stretch at end range    ABduction  10 reps bilaterally with stretch at end range      Manual Therapy   Manual Therapy  Passive ROM    Passive ROM  briefly to R shoulder in direction of flexion and abduction                  PT Long Term Goals - 09/10/17 1538      PT LONG TERM GOAL #1   Title  Pt will have 150 degrees of left shoulder abduction so that she can easily get in and out of position needed for radiation     Baseline  120 degrees, 09/10/17- 176    Time  4    Period  Weeks    Status  Achieved      PT LONG TERM GOAL #2   Title  Pt will be independent in a home program for shoudler ROM and progressive strengthening     Baseline  no knowledge     Time  4    Period  Weeks    Status  On-going      PT LONG TERM GOAL #3   Title  Pt will verbalized strategies to decrease lymphedema risk     Baseline  09/10/17- pt is independent in this    Time  4    Period  Weeks    Status  Achieved            Plan - 09/10/17 1547    Clinical Impression Statement  Instructed pt in AAROM and supine scapular exercises today. Her ROM has improved tremendously since her evaluation and she has met her ROM goal. Now focus is on progressing pt towards independence with a home exercise program. Will instruct pt in strength ABC program at next session and assess indep with supine scap series.     Rehab Potential  Good    Clinical Impairments Affecting Rehab Potential  3 nodes removed, will have radiation     PT Frequency  1x / week    PT Duration  4 weeks    PT Treatment/Interventions  ADLs/Self Care Home Management;Scar mobilization;Passive range of motion;Patient/family education;Orthotic Fit/Training;Therapeutic activities;Therapeutic  exercise;Taping    PT Next Visit Plan  assess indep with supine scap series, instruct pt Strength ABC program and begin with 1 or 2 lb weights    PT Home Exercise Plan  dowel rod flexion and abduction, supine scap series  Consulted and Agree with Plan of Care  Patient       Patient will benefit from skilled therapeutic intervention in order to improve the following deficits and impairments:  Decreased skin integrity, Increased fascial restricitons, Postural dysfunction, Decreased scar mobility, Decreased range of motion, Decreased strength, Impaired perceived functional ability, Impaired UE functional use  Visit Diagnosis: Stiffness of left shoulder, not elsewhere classified  Stiffness of right shoulder, not elsewhere classified  Muscle weakness (generalized)     Problem List Patient Active Problem List   Diagnosis Date Noted  . UTI (urinary tract infection) 04/24/2017  . 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  . Genetic testing 03/05/2017  . Port catheter in place 01/30/2017  . Malignant neoplasm of upper-outer quadrant of left breast in female, estrogen receptor positive ([place]) 01/05/2017  . Tobacco use 12/12/2016    Allyson Sabal [facility] 09/10/2017, 4:01 PM  [place], [place], [address] Phone: [phone]   Fax:  [phone]  Name: Kathryn Stanley MRN: [id number] Date of Birth: 02/16/1976  Manus Gunning, PT 09/10/17 4:02 PM

## 2017-09-10 NOTE — Patient Instructions (Signed)

## 2017-09-11 ENCOUNTER — Ambulatory Visit
Admission: RE | Admit: 2017-09-11 | Discharge: 2017-09-11 | Disposition: A | Discharge status: Home / Self Care | Payer: Medicaid Other | Source: Ambulatory Visit | Attending: Radiation Oncology | Admitting: Radiation Oncology

## 2017-09-11 ENCOUNTER — Ambulatory Visit: Payer: Medicaid Other

## 2017-09-11 DIAGNOSIS — Z510 Encounter for antineoplastic radiation therapy: Secondary | ICD-10-CM | POA: Diagnosis not present

## 2017-09-14 ENCOUNTER — Ambulatory Visit: Payer: Medicaid Other

## 2017-09-14 ENCOUNTER — Ambulatory Visit
Admission: RE | Admit: 2017-09-14 | Discharge: 2017-09-14 | Disposition: A | Discharge status: Home / Self Care | Payer: Medicaid Other | Source: Ambulatory Visit | Attending: Radiation Oncology | Admitting: Radiation Oncology

## 2017-09-14 DIAGNOSIS — Z510 Encounter for antineoplastic radiation therapy: Secondary | ICD-10-CM | POA: Diagnosis not present

## 2017-09-15 ENCOUNTER — Ambulatory Visit
Admission: RE | Admit: 2017-09-15 | Discharge: 2017-09-15 | Disposition: A | Discharge status: Home / Self Care | Payer: Medicaid Other | Source: Ambulatory Visit | Attending: Radiation Oncology | Admitting: Radiation Oncology

## 2017-09-15 ENCOUNTER — Ambulatory Visit: Payer: Medicaid Other

## 2017-09-15 DIAGNOSIS — Z510 Encounter for antineoplastic radiation therapy: Secondary | ICD-10-CM | POA: Diagnosis not present

## 2017-09-16 ENCOUNTER — Inpatient Hospital Stay (HOSPITAL_BASED_OUTPATIENT_CLINIC_OR_DEPARTMENT_OTHER): Payer: Medicaid Other | Admitting: Nurse Practitioner

## 2017-09-16 ENCOUNTER — Ambulatory Visit: Payer: Medicaid Other | Admitting: Rehabilitation

## 2017-09-16 ENCOUNTER — Ambulatory Visit
Admission: RE | Admit: 2017-09-16 | Discharge: 2017-09-16 | Disposition: A | Discharge status: Home / Self Care | Payer: Medicaid Other | Source: Ambulatory Visit | Attending: Radiation Oncology | Admitting: Radiation Oncology

## 2017-09-16 ENCOUNTER — Ambulatory Visit: Payer: Medicaid Other

## 2017-09-16 ENCOUNTER — Ambulatory Visit: Payer: Medicaid Other | Admitting: Gynecologic Oncology

## 2017-09-16 VITALS — BP 131/72 | HR 82 | Temp 97.5°F | Resp 18 | Ht 64.0 in | Wt 203.3 lb

## 2017-09-16 DIAGNOSIS — F39 Unspecified mood [affective] disorder: Secondary | ICD-10-CM | POA: Diagnosis not present

## 2017-09-16 DIAGNOSIS — Z1501 Genetic susceptibility to malignant neoplasm of breast: Secondary | ICD-10-CM

## 2017-09-16 DIAGNOSIS — M25612 Stiffness of left shoulder, not elsewhere classified: Secondary | ICD-10-CM

## 2017-09-16 DIAGNOSIS — R232 Flushing: Secondary | ICD-10-CM | POA: Diagnosis not present

## 2017-09-16 DIAGNOSIS — C50412 Malignant neoplasm of upper-outer quadrant of left female breast: Secondary | ICD-10-CM

## 2017-09-16 DIAGNOSIS — M6281 Muscle weakness (generalized): Secondary | ICD-10-CM

## 2017-09-16 DIAGNOSIS — Z171 Estrogen receptor negative status [ER-]: Secondary | ICD-10-CM | POA: Diagnosis not present

## 2017-09-16 DIAGNOSIS — M25611 Stiffness of right shoulder, not elsewhere classified: Secondary | ICD-10-CM

## 2017-09-16 DIAGNOSIS — R740 Nonspecific elevation of levels of transaminase and lactic acid dehydrogenase [LDH]: Secondary | ICD-10-CM | POA: Diagnosis not present

## 2017-09-16 DIAGNOSIS — R948 Abnormal results of function studies of other organs and systems: Secondary | ICD-10-CM | POA: Diagnosis not present

## 2017-09-16 DIAGNOSIS — L03032 Cellulitis of left toe: Secondary | ICD-10-CM | POA: Diagnosis not present

## 2017-09-16 DIAGNOSIS — M7989 Other specified soft tissue disorders: Secondary | ICD-10-CM

## 2017-09-16 DIAGNOSIS — Z9221 Personal history of antineoplastic chemotherapy: Secondary | ICD-10-CM

## 2017-09-16 DIAGNOSIS — R293 Abnormal posture: Secondary | ICD-10-CM

## 2017-09-16 DIAGNOSIS — R635 Abnormal weight gain: Secondary | ICD-10-CM

## 2017-09-16 DIAGNOSIS — Z170 Estrogen receptor positive status [ER+]: Principal | ICD-10-CM

## 2017-09-16 DIAGNOSIS — C773 Secondary and unspecified malignant neoplasm of axilla and upper limb lymph nodes: Secondary | ICD-10-CM | POA: Diagnosis not present

## 2017-09-16 DIAGNOSIS — Z510 Encounter for antineoplastic radiation therapy: Secondary | ICD-10-CM | POA: Diagnosis not present

## 2017-09-16 DIAGNOSIS — F17210 Nicotine dependence, cigarettes, uncomplicated: Secondary | ICD-10-CM | POA: Diagnosis not present

## 2017-09-16 DIAGNOSIS — Z9011 Acquired absence of right breast and nipple: Secondary | ICD-10-CM

## 2017-09-16 DIAGNOSIS — Z923 Personal history of irradiation: Secondary | ICD-10-CM

## 2017-09-16 DIAGNOSIS — R21 Rash and other nonspecific skin eruption: Secondary | ICD-10-CM | POA: Diagnosis not present

## 2017-09-16 DIAGNOSIS — K219 Gastro-esophageal reflux disease without esophagitis: Secondary | ICD-10-CM

## 2017-09-16 NOTE — Progress Notes (Addendum)
[place]  [phone] [phone]  Clinic Follow up Note   Patient Care Team: Kathryn Asal, MD as PCP - General (Internal Medicine) Kathryn Bookbinder, MD as Consulting Physician (General Surgery) Kathryn Merle, MD as Consulting Physician (Hematology) Kathryn Rudd, MD as Consulting Physician (Radiation Oncology) 09/16/2017  SUMMARY OF ONCOLOGIC HISTORY: Oncology History   Cancer Staging Malignant neoplasm of upper-outer quadrant of left breast in female, estrogen receptor positive ([place]) Staging form: Breast, AJCC 8th Edition - Clinical stage from 12/29/2016: Stage IIIA (cT2, cN1, cM0, G3, ER: Positive, PR: Negative, HER2: Negative) - Signed by Kathryn Merle, MD on 01/05/2017       Malignant neoplasm of upper-outer quadrant of left breast in female, estrogen receptor positive ([place])   12/25/2016 Mammogram    [place] and [facility] Diagnostic Breast Tomo Bilateral 12/25/16 IMPRESSION: 1. Suspicious mass 2.3 x 1.6 x 3.0 cm  in the 130 o'clock location of the left breast 8cm from the nipple, biopsy recommended. Adjacent simple cyst is 1.9 cm. 2. Suspicious left axillary lymph node 2.2cm for which biopsy is indicated. 3. Indeterminate group of calcifications in the upper central portion of the left breast for which biopsy is recommended.       12/29/2016 Initial Biopsy    Diagnosis 12/29/16 1. Breast, left, needle core biopsy, stereotactic, upper inner quadrant - FIBROCYSTIC CHANGES INCLUDING APOCRINE METAPLASIA WITH CALCIFICATIONS - NO MALIGNANCY IDENTIFIED 2. Breast, left, needle core biopsy, ultrasound, 1:30 o'clock - INVASIVE MAMMARY CARCINOMA, G3 - SEE COMMENT 3. Lymph node, needle/core biopsy, ultrasound, left axillary - METASTIC CARCINOMA INVOLVING ONE LYMPH NODE (1/1) E-cadherin is positive supporting a ductal origin.       12/29/2016 Receptors her2    ER 15%+, weak staining  PR - HER2- Ki67 10%       12/29/2016 Initial Diagnosis    Malignant  neoplasm of upper-outer quadrant of left breast in female, estrogen receptor positive ([place])      01/13/2017 Echocardiogram    ECHO 01/13/17 Study Conclusions - Left ventricle: The cavity size was normal. Systolic function was   normal. The estimated ejection fraction was in the range of 60%   to 65%. Wall motion was normal; there were no regional wall   motion abnormalities. Left ventricular diastolic function   parameters were normal. - Mitral valve: There was trivial regurgitation. - Right atrium: The atrium was mildly dilated. - Tricuspid valve: There was trivial regurgitation.       01/14/2017 Surgery    Port placement by Dr. Donne Stanley       01/15/2017 Imaging    Whole Body bone Scan 01/15/17 IMPRESSION: 1.  No significant abnormality identified.      01/15/2017 Imaging    CT CAP W Contrast 01/15/17 IMPRESSION: 1. Left lateral breast mass with pathologic left axillary adenopathy including a 2.0 cm left axillary lymph node. 2. Several hypodense lesions in the liver are likely benign lesions such as cysts. However, these are technically too small to characterize by CT. Possibilities for further workup include surveillance or hepatic protocol MRI with and without contrast. 3. There is also a tiny hypodense lesion in the spleen which is technically nonspecific but statistically highly likely to be benign. 4.  Prominent stool throughout the colon favors constipation      01/16/2017 - 06/05/2017 Chemotherapy    Neoadjuvant chemo AC every 2 weeks for 4 cycles starting on 01/16/2017 and ended 02/27/17. Then proceed with weekly taxol for 12 treatments starting 03/13/17. Added Carboplatin AUC 2  on 04/03/17 to weekly taxol. Due to symptomatic thrombocytopenia we held Carbo since cycle 8. Cycle 9 was post-poned due to neutropenia and Granix was added starting 05/12/17. Added carbo back at reduced dose AUC 1.5 with cycle 10 on 05/22/17. 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   Analysis also detected a Variant of Uncertain Significance (VUS) in the NBN gene called c.628G>T (p.Val210Phe)      06/12/2017 Imaging    MR BREAST BILATERAL WO CONTRAST IMPRESSION: 1. Significant positive response to neoadjuvant chemotherapy. The index carcinoma in the left breast has significantly decreased in size. Left axillary adenopathy has also significantly improved. 2. No other evidence of malignancy.      07/16/2017 Surgery    Pt underwent a left mastectomy with Dr. Donne Stanley      07/16/2017 Pathology Results    Diagnosis 07/16/17 1. Breast, simple mastectomy, Right Prophylactic - FIBROCYSTIC CHANGES INCLUDING APOCRINE METAPLASIA - DUCT ECTASIA - CALCIFICATIONS - NO MALIGNANCY IDENTIFIED 2. Lymph node, sentinel, biopsy, Left Axillary - NO CARCINOMA IDENTIFIED IN ONE LYMPH NODE (0/1) - SEE COMMENT 3. Lymph node, sentinel, biopsy, Left - NO CARCINOMA IDENTIFIED IN ONE LYMPH NODE (0/1) - SEE COMMENT 4. Breast, simple mastectomy, Left - INVASIVE DUCTAL CARCINOMA, NOTTINGHAM GRADE 2 OF 3, 1.3 CM - MARGINS UNINVOLVED BY CARCINOMA (2 CM POSTERIOR MARGIN) - PREVIOUS BIOPSY SITE CHANGES - CALCIFICATIONS - SEE ONCOLOGY TABLE BELOW      07/16/2017 Receptors her2    Results: IMMUNOHISTOCHEMICAL AND MORPHOMETRIC ANALYSIS PERFORMED MANUALLY Estrogen Receptor: 0%, NEGATIVE Progesterone Receptor: 0%, NEGATIVE Results: HER2 - NEGATIVE RATIO OF HER2/CEP17 SIGNALS 1.73 AVERAGE HER2 COPY NUMBER PER CELL 2.67      09/02/2017 -  Radiation Therapy    Adjuvant radiation per Dr. Lisbeth Stanley      CURRENT THERAPY: adjuvant radiation per Dr. Lisbeth Stanley began 09/02/17  INTERVAL HISTORY: Kathryn Stanley presents with her husband for office visit to discuss further adjuvant therapy. She is currently undergoing adjuvant RT per Dr. Lisbeth Stanley since 09/02/17. She is tolerating well so far. She  recovered quickly from reconstruction. Has restarted working. She has significant feet pain after standing/walking at work, toenails are improving; no neuropathy. Applying topical hydrocortisone to facial rash which has not improved much. Occasional night sweats and hot flashes. 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 She is requesting more information.   REVIEW OF SYSTEMS:   Constitutional: Denies fevers, chills or abnormal weight loss (+) weight gain Respiratory: Denies cough, dyspnea or wheezes Cardiovascular: Denies palpitation, chest discomfort or lower extremity swelling Gastrointestinal:  Denies nausea, heartburn or change in bowel habits Skin:(+) facial skin rashes (+) nail toxicity, improved (+) chest/breast skin redness  Lymphatics: Denies new lymphadenopathy or easy bruising Neurological:Denies numbness, tingling or new weaknesses Behavioral/Psych: (+) mood fluctuation (+) hot flash/night sweat  MSK: (+) foot pain with prolonged ambulation All other systems were reviewed with the patient and are negative.  MEDICAL HISTORY:  Past Medical History:  Diagnosis Date  . Breast cancer, left breast ([place]) dx'd 01/2017  . Dermatologic problem    possible psoriasis. Pt has not had been diagnosed by dermatology.  . 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 GERD (  gastroesophageal reflux disease)   . Tobacco dependence    trying to quit with nicotene patches    SURGICAL HISTORY: Past Surgical History:  Procedure Laterality Date  . BREAST BIOPSY Left 12/2016  . BREAST RECONSTRUCTION Bilateral 07/16/2017   BILATERAL BREAST RECONSTRUCTION WITH PLACEMENT OF TISSUE EXPANDER AND ALLODERM/notes 07/16/2017  . CESAREAN SECTION  2002  . MASTECTOMY Bilateral 07/16/2017   LEFT SKIN SPARING MASTECTOMY WITH  LEFT RADIOACTIVE SEED TARGETED AXILLARY LYMPH NODE EXCISION AND AXILLARY SENTINEL LYMPH NODE BIOPSY; RIGHT PROPHYLACTIC SKIN SPARING MASTECTOMY [name] 07/16/2017  . MASTECTOMY WITH RADIOACTIVE SEED GUIDED EXCISION AND AXILLARY SENTINEL LYMPH NODE BIOPSY Left 07/16/2017   Procedure: LEFT SKIN SPARING MASTECTOMY WITH LEFT RADIOACTIVE SEED TARGETED AXILLARY LYMPH NODE EXCISION AND AXILLARY SENTINEL LYMPH NODE BIOPSY;  Surgeon: Kathryn Bookbinder, MD;  Location: [facility];  Service: General;  Laterality: Left;  . NIPPLE SPARING MASTECTOMY Right 07/16/2017   Procedure: RIGHT PROPHYLACTIC SKIN SPARING MASTECTOMY;  Surgeon: Kathryn Bookbinder, MD;  Location: [facility];  Service: General;  Laterality: Right;  . PORT-A-CATH REMOVAL N/A 07/16/2017   Procedure: REMOVAL PORT-A-CATH;  Surgeon: Kathryn Bookbinder, MD;  Location: [facility];  Service: General;  Laterality: N/A;  . PORTA CATH REMOVAL  07/16/2017  . PORTACATH PLACEMENT N/A 01/14/2017   Procedure: INSERTION PORT-A-CATH WITH [place];  Surgeon: Kathryn Bookbinder, MD;  Location: [facility];  Service: General;  Laterality: N/A;    I have reviewed the social history and family history with the patient and they are unchanged from previous note.  ALLERGIES:  is allergic to amoxicillin; chlorhexidine gluconate; and nicotine.  MEDICATIONS:  Current Outpatient Medications  Medication Sig Dispense Refill  . famotidine (PEPCID) 10 MG tablet Take 10 mg by mouth daily as needed for heartburn or indigestion.    Marland Kitchen ibuprofen (ADVIL,MOTRIN) 200 MG tablet Take 200-400 mg by mouth daily as needed for headache or moderate pain.     No current facility-administered medications for this visit.     PHYSICAL EXAMINATION: ECOG PERFORMANCE STATUS: 1 - Symptomatic but completely ambulatory  Vitals:   09/16/17 1505  BP: 131/72  Pulse: 82  Resp: 18  Temp: (!) 97.5 F (36.4 C)  SpO2: 100%   Filed Weights   09/16/17 1505  Weight: 203 lb 4.8 oz (92.2 kg)    GENERAL:alert, no distress and  comfortable SKIN: skin color, texture, turgor are normal, no rashes or significant lesions (+) mild facial erythema and dryness  EYES: normal, Conjunctiva are pink and non-injected, sclera clear LYMPH:  no palpable axillary lymphadenopathy  LUNGS: clear to auscultation with normal breathing effort HEART: regular rate & rhythm and no murmurs and no lower extremity edema Musculoskeletal:no cyanosis of digits and no clubbing  NEURO: alert & oriented x 3 with fluent speech BREASTS: inspection shows bilateral mastectomy s/p reconstruction; incisions are closed healing well, small area of granulation tissue to inferior left breast incision. Diffuse erythema to left reconstructed breast secondary to radiation   LABORATORY DATA:  I have reviewed the data as listed CBC Latest Ref Rng & Units 09/07/2017 08/03/2017 07/09/2017  WBC 3.9 - 10.3 K/uL 4.5 4.1 4.8  Hemoglobin 11.6 - 15.9 g/dL 13.1 12.3 13.1  Hematocrit 34.8 - 46.6 % 39.3 37.1 38.9  Platelets 145 - 400 K/uL 199 199 193     CMP Latest Ref Rng & Units 09/07/2017 08/03/2017 06/23/2017  Glucose 70 - 140 mg/dL 88 130 93  BUN 7 - 26 mg/dL _0 Creatinine 0.60 - 1.10 mg/dL 0.92 0.85 0.83  Sodium 136 - 145 mmol/L 140 143 139  Potassium 3.5 - 5.1 mmol/L 4.1 3.8 3.7  Chloride 98 - 109 mmol/L 107 107 107  CO2 22 - 29 mmol/L _0 Calcium 8.4 - 10.4 mg/dL 9.4 9.5 9.1  Total Protein 6.4 - 8.3 g/dL 6.9 6.8 6.6  Total Bilirubin 0.2 - 1.2 mg/dL 0.2 0.3 0.4  Alkaline Phos 40 - 150 U/L 80 80 71  AST 5 - 34 U/L 32 26 54(H)  ALT 0 - 55 U/L 63(H) 51 90(H)   Surgical pathology 07/16/17 bilateral mastectomy   Diagnosis 1. Breast, simple mastectomy, Right Prophylactic - FIBROCYSTIC CHANGES INCLUDING APOCRINE METAPLASIA - DUCT ECTASIA - CALCIFICATIONS - NO MALIGNANCY IDENTIFIED 2. Lymph node, sentinel, biopsy, Left Axillary - NO CARCINOMA IDENTIFIED IN ONE LYMPH NODE (0/1) - SEE COMMENT 3. Lymph node, sentinel, biopsy, Left - NO CARCINOMA  IDENTIFIED IN ONE LYMPH NODE (0/1) - SEE COMMENT 4. Breast, simple mastectomy, Left - INVASIVE DUCTAL CARCINOMA, NOTTINGHAM GRADE 2 OF 3, 1.3 CM - MARGINS UNINVOLVED BY CARCINOMA (2 CM POSTERIOR MARGIN) - PREVIOUS BIOPSY SITE CHANGES - CALCIFICATIONS - SEE ONCOLOGY TABLE BELOW Microscopic Comment 2. and 3. Cytokeratin AE1/3 was performed on the sentinel lymph nodes to exclude micrometastasis. There is no evidence of metastatic carcinoma by immunohistochemistry. 4. BREAST, STATUS POST NEOADJUVANT TREATMENT Procedure: Total mastectomy Laterality: Left Tumor Size: 1.3 cm (glass slide measurement) Histologic Type: Invasive carcinoma of no special type (ductal, not otherwise specified) Grade: Nottingham Grade 2 Tubular Differentiation: 3 Nuclear Pleomorphism: 3 Mitotic Count: 1 Ductal Carcinoma in Situ (DCIS): Not identified Regional Lymph Nodes: Number of Lymph Nodes Examined: 2 Number of Sentinel Lymph Nodes Examined: 2 Lymph Nodes with Macrometastases: 0 Lymph Nodes with Micrometastases: 0  Microscopic Comment(continued) Lymph Nodes with Isolated Tumor Cells: 0 Margins: Uninvolved by invasive carcinoma Invasive carcinoma, distance from closest margin: 2 cm (posterior/deep) DCIS, distance from closest margin: N/A Breast Prognostic Profile (pre-neoadjuvant case #: [id number]) Estrogen Receptor: Positive (15%, weak) Progesterone Receptor: Negative Her2: Negative (Ratio 1.63) Ki-67: 10% Will be repeated on the current case (Block #: [id number]) and the results reported separately. Residual Cancer Burden (RCB): Primary Tumor Bed: 13 mm x 10 mm Overall Cancer Cellularity: 20% Percentage of Cancer that is in Situ: N/A Number of Positive Lymph Nodes: 0 Diameter of Largest Lymph Node metastasis: N/A Residual Cancer Burden : 1.611 Residual Cancer Burden Class: RCB-II Pathologic Stage Classification (p TNM, AJCC 8th Edition): Primary Tumor: ypT1c Regional Lymph Nodes: ypN0 COMMENT:  E-cadherin immunohistochemistry was performed on the biopsy material and was positive supporting a ductal origin.  4. PROGNOSTIC INDICATORS Results: IMMUNOHISTOCHEMICAL AND MORPHOMETRIC ANALYSIS PERFORMED MANUALLY Estrogen Receptor: 0%, NEGATIVE Progesterone Receptor: 0%, NEGATIVE   4. FLUORESCENCE IN-SITU HYBRIDIZATION Results: HER2 - NEGATIVE RATIO OF HER2/CEP17 SIGNALS 1.73 AVERAGE HER2 COPY NUMBER PER CELL 2.67 Reference Range:  FINAL DIAGNOSIS RADIOGRAPHIC STUDIES: I have personally reviewed the radiological images as listed and agreed with the findings in the report. No results found.   ASSESSMENT & PLAN: Kathryn Stanley is a 42 y.o. premenopausal female with no significant past medical history, presented with a palpable left breast mass.  1. Malignant neoplasm of upper-outer quadrant of left breast in female, ductal carcinoma, cT2N1M0, Stage IIIa, ER weakly +, PR - , HER2 - , Grade 3, ypT1cN0M0, triple negative  -Ms. Foskey appears stable; she recovered well from neoadjuvant chemo and bilateral mastectomy with reconstruction.  -We reviewed adjuvant therapy including Xeloda, as previously recommended by Dr. Burr Medico,  to further reduce her risk of cancer recurrence; given her residual T1c and triple negative disease, her distant  recurrence risk stands around 20%, Dr. Burr Medico reviewed Xeloda could potentially reduce that risk to approx 15%. We reviewed possible side effects including GI, skin, and hematologic toxicities. If she elects treatment but does not tolerate well, will discontinue.  -Dr. Burr Medico reviewed the clinical trial for immunotherapy Keytruda in triple negative cancer; she understands there is no data to quote at this time. She is still considering.  -will f/u again towards end of radiation to finalize treatment plan  2. Genetics: BRCA 1+ -plans to undergo prophylactic hysterectomy per Dr. Denman George 11/03/17. Will discuss recovery process with Dr. Denman George; if she will require full  month to recover, Dr. Burr Medico favors delaying hysterectomy until after adjuvant Xeloda; if she will not need that long to recover, OK to undergo surgery first then begin oral chemo  3. Financial support -Medicaid.  -She has an understanding employer and started working again   4. Transaminitis  -mild and fluctuant since chemotherapy, likely related to chemo. No labs done today. Will continue to monitor.   5. Facial rash with skin peeling -Developed since completing chemo, not improved with topical hydrocortisone TID. I will refer her to dermatology, one who takes medicaid.   6. Left ankle swelling -Fluctuates with activity; negative DVT on 09/07/17  7. Paronychia, improved  -No evidence of infection  8. Mood fluctuation, weight gain, night sweat/hot flash -LMP 01/2017 around the time she began chemotherapy. Symptoms could be related to peri-menopause. I encouraged her to eat well and be active. Will monitor closely.   All questions were answered. The patient knows to call the clinic with any problems, questions or concerns. No barriers to learning was detected.     Alla Feeling, NP 09/16/17   Addendum  I have seen the patient, examined her. I agree with the assessment and and plan and have edited the notes.   Makhiya and her husband come in today to further discuss the role of adjuvant Xeloda and the immunotherapy clinical trial.  I again reviewed the aggressive nature of triple negative disease, and high risk of recurrence (30-40%).  She has had significant residual disease after neoadjuvant chemo, I do think she would benefit from adjuvant Xeloda (further reduce her risk of recurrence by ~5%), and I encourage her to participate the adjuvant pembro trial.  Her questions were answered, she voiced good understanding, and appreciated the consultation today.  I plan to see her back towards the end of her radiation. Plan to start Xeloda in June when she recovers from BSO and hysterectomy.  Kathryn Stanley  09/16/2017

## 2017-09-16 NOTE — Therapy (Signed)
[place], [place], [address] Phone: [phone]   Fax:  [phone]  Physical Therapy Treatment  Patient Details  Name: Kathryn Stanley MRN: [id number] Date of Birth: 1975-10-17 Referring Provider: Dr. Iran Planas    Encounter Date: 09/16/2017  PT End of Session - 09/16/17 1659    Visit Number  3    Number of Visits  4    Date for PT Re-Evaluation  09/25/17    Authorization Type  Medicaid     PT Start Time  9179    PT Stop Time  1652    PT Time Calculation (min)  43 min    Activity Tolerance  Patient tolerated treatment well    Behavior During Therapy  [facility] for tasks assessed/performed       Past Medical History:  Diagnosis Date  . Breast cancer, left breast ([place]) dx'd 01/2017  . Dermatologic problem    possible psoriasis. Pt has not had been diagnosed by dermatology.  . 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 GERD (gastroesophageal reflux disease)   . Tobacco dependence    trying to quit with nicotene patches    Past Surgical History:  Procedure Laterality Date  . BREAST BIOPSY Left 12/2016  . BREAST RECONSTRUCTION Bilateral 07/16/2017   BILATERAL BREAST RECONSTRUCTION WITH PLACEMENT OF TISSUE EXPANDER AND ALLODERM/notes 07/16/2017  . CESAREAN SECTION  2002  . MASTECTOMY Bilateral 07/16/2017   LEFT SKIN SPARING MASTECTOMY WITH LEFT RADIOACTIVE SEED TARGETED AXILLARY LYMPH NODE EXCISION AND AXILLARY SENTINEL LYMPH NODE BIOPSY; RIGHT PROPHYLACTIC SKIN SPARING MASTECTOMY [name] 07/16/2017  . MASTECTOMY WITH RADIOACTIVE SEED GUIDED EXCISION AND AXILLARY SENTINEL LYMPH NODE BIOPSY Left 07/16/2017   Procedure: LEFT SKIN SPARING MASTECTOMY WITH LEFT RADIOACTIVE SEED TARGETED AXILLARY LYMPH NODE EXCISION AND AXILLARY SENTINEL LYMPH NODE BIOPSY;  Surgeon: [name], MD;  Location: [facility];  Service: General;  Laterality: Left;   . NIPPLE SPARING MASTECTOMY Right 07/16/2017   Procedure: RIGHT PROPHYLACTIC SKIN SPARING MASTECTOMY;  Surgeon: [name], MD;  Location: [facility];  Service: General;  Laterality: Right;  . PORT-A-CATH REMOVAL N/A 07/16/2017   Procedure: REMOVAL PORT-A-CATH;  Surgeon: [name], MD;  Location: [facility];  Service: General;  Laterality: N/A;  . PORTA CATH REMOVAL  07/16/2017  . PORTACATH PLACEMENT N/A 01/14/2017   Procedure: INSERTION PORT-A-CATH WITH [place];  Surgeon: [name], MD;  Location: [facility];  Service: General;  Laterality: N/A;    There were no vitals filed for this visit.  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  The plan now is Radiation - Hysterectomy - Xeloda Chemo pill x 4 months on a schedule.  Feeling loosy goosey.      Pertinent History  Diagnosed in August 2018  on Feb 7,   bilateral mastectomy with 3 nodes removed on the left and immediate bilateral expanders, had chemo first  and will have radiatoin that starts next week     Patient Stated Goals  want to know what kind of exercises she can do and when she can start doing what she used to do before at work     Currently in Pain?  No/denies                       [facility] Adult PT Treatment/Exercise -  09/16/17 0001      Exercises   Exercises  Shoulder      Shoulder Exercises: Supine   Horizontal ABduction  Strengthening;Both;10 reps;Theraband    Theraband Level (Shoulder Horizontal ABduction)  Level 3 (Green)    External Rotation  Strengthening;Both;10 reps;Theraband    Theraband Level (Shoulder External Rotation)  Level 3 (Green)    Flexion  Strengthening;Both;10 reps;Theraband narrow and wide    Theraband Level (Shoulder Flexion)  Level 3 (Green)    Other Supine Exercises  D2 x 10 each green band      Shoulder Exercises: Standing   Other Standing Exercises  scaption 2# x 10 bil , tricep extension  and row 2# bil per ABC hanout  weight easy      Manual Therapy   Manual Therapy  Passive ROM    Passive ROM  briefly to bilateral shoulders in all directions wtihout significant ROM loss             PT Education - 09/16/17 1657    Education provided  Yes    Education Details  given ABC strength handout with education on all    Person(s) Educated  Patient    Methods  Explanation;Demonstration;Handout    Comprehension  Verbalized understanding;Need further instruction          PT Long Term Goals - 09/16/17 1704      PT LONG TERM GOAL #2   Title  Pt will be independent in a home program for shoudler ROM and progressive strengthening     Status  On-going      PT LONG TERM GOAL #3   Title  Pt will verbalized strategies to decrease lymphedema risk             Plan - 09/16/17 1659    Clinical Impression Statement  Pt arriving with no complaints of shoulder stiffness and with no signficant limitations during PROM bilaterally.  Increased supine scapular series to green band per patient request and gave handout for ABC strength.  Performed a couple standing weight exercises today with cueing for completion with 2# and pt reporting this was a bit easy.  Educated on moving up in sets before weight and where to buy an adjustable weight    Rehab Potential  Good    Clinical Impairments Affecting Rehab Potential  3 nodes removed, in radiation currently    PT Frequency  1x / week    PT Duration  4 weeks    PT Treatment/Interventions  ADLs/Self Care Home Management;Scar mobilization;Passive range of motion;Patient/family education;Orthotic Fit/Training;Therapeutic activities;Therapeutic exercise;Taping    PT Next Visit Plan  last visit next time; check indep with exercises and shoulder ROM; education on keeping active during upcoming procedures/rest of radiation    Consulted and Agree with Plan of Care  Patient       Patient will benefit from skilled therapeutic intervention in  order to improve the following deficits and impairments:  Decreased skin integrity, Increased fascial restricitons, Postural dysfunction, Decreased scar mobility, Decreased range of motion, Decreased strength, Impaired perceived functional ability, Impaired UE functional use  Visit Diagnosis: Stiffness of left shoulder, not elsewhere classified  Stiffness of right shoulder, not elsewhere classified  Muscle weakness (generalized)  Carcinoma of upper-outer quadrant of left breast in female, estrogen receptor positive ([facility])  Abnormal posture     Problem List Patient Active Problem List   Diagnosis Date Noted  . UTI (urinary tract infection) 04/24/2017  . 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  .  Genetic testing 03/05/2017  . Port catheter in place 01/30/2017  . Malignant neoplasm of upper-outer quadrant of left breast in female, estrogen receptor positive ([place]) 01/05/2017  . Tobacco use 12/12/2016    Shan Levans, PT 09/16/2017, 5:05 PM  [place], [place], [address] Phone: [phone]   Fax:  [phone]  Name: Kathryn Stanley MRN: [id number] Date of Birth: 16-Sep-1975

## 2017-09-17 ENCOUNTER — Ambulatory Visit
Admission: RE | Admit: 2017-09-17 | Discharge: 2017-09-17 | Disposition: A | Discharge status: Home / Self Care | Payer: Medicaid Other | Source: Ambulatory Visit | Attending: Radiation Oncology | Admitting: Radiation Oncology

## 2017-09-17 ENCOUNTER — Ambulatory Visit: Payer: Medicaid Other

## 2017-09-17 DIAGNOSIS — Z510 Encounter for antineoplastic radiation therapy: Secondary | ICD-10-CM | POA: Diagnosis not present

## 2017-09-18 ENCOUNTER — Ambulatory Visit: Payer: Medicaid Other

## 2017-09-18 ENCOUNTER — Ambulatory Visit
Admission: RE | Admit: 2017-09-18 | Discharge: 2017-09-18 | Disposition: A | Discharge status: Home / Self Care | Payer: Medicaid Other | Source: Ambulatory Visit | Attending: Radiation Oncology | Admitting: Radiation Oncology

## 2017-09-18 DIAGNOSIS — Z510 Encounter for antineoplastic radiation therapy: Secondary | ICD-10-CM | POA: Diagnosis not present

## 2017-09-21 ENCOUNTER — Ambulatory Visit: Payer: Medicaid Other

## 2017-09-21 ENCOUNTER — Ambulatory Visit
Admission: RE | Admit: 2017-09-21 | Discharge: 2017-09-21 | Disposition: A | Discharge status: Home / Self Care | Payer: Medicaid Other | Source: Ambulatory Visit | Attending: Radiation Oncology | Admitting: Radiation Oncology

## 2017-09-21 ENCOUNTER — Ambulatory Visit: Payer: Medicaid Other | Admitting: Physical Therapy

## 2017-09-21 ENCOUNTER — Encounter: Payer: Self-pay | Admitting: Physical Therapy

## 2017-09-21 DIAGNOSIS — M25612 Stiffness of left shoulder, not elsewhere classified: Secondary | ICD-10-CM | POA: Diagnosis not present

## 2017-09-21 DIAGNOSIS — M6281 Muscle weakness (generalized): Secondary | ICD-10-CM

## 2017-09-21 DIAGNOSIS — M25611 Stiffness of right shoulder, not elsewhere classified: Secondary | ICD-10-CM

## 2017-09-21 DIAGNOSIS — R293 Abnormal posture: Secondary | ICD-10-CM

## 2017-09-21 DIAGNOSIS — Z510 Encounter for antineoplastic radiation therapy: Secondary | ICD-10-CM | POA: Diagnosis not present

## 2017-09-21 NOTE — Therapy (Signed)
[place], [place], [address] Phone: [phone]   Fax:  [phone]  Physical Therapy Treatment  Patient Details  Name: Kathryn Stanley MRN: [id number] Date of Birth: Nov 02, 1975 Referring Provider: Dr. Iran Planas    Encounter Date: 09/21/2017  PT End of Session - 09/21/17 1621    Visit Number  4    Number of Visits  4    Date for PT Re-Evaluation  09/25/17    Authorization Type  Medicaid     PT Start Time  1602    PT Stop Time  1633    PT Time Calculation (min)  31 min    Activity Tolerance  Patient tolerated treatment well    Behavior During Therapy  [facility] for tasks assessed/performed       Past Medical History:  Diagnosis Date  . Breast cancer, left breast ([place]) dx'd 01/2017  . Dermatologic problem    possible psoriasis. Pt has not had been diagnosed by dermatology.  . 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 GERD (gastroesophageal reflux disease)   . Tobacco dependence    trying to quit with nicotene patches    Past Surgical History:  Procedure Laterality Date  . BREAST BIOPSY Left 12/2016  . BREAST RECONSTRUCTION Bilateral 07/16/2017   BILATERAL BREAST RECONSTRUCTION WITH PLACEMENT OF TISSUE EXPANDER AND ALLODERM/notes 07/16/2017  . CESAREAN SECTION  2002  . MASTECTOMY Bilateral 07/16/2017   LEFT SKIN SPARING MASTECTOMY WITH LEFT RADIOACTIVE SEED TARGETED AXILLARY LYMPH NODE EXCISION AND AXILLARY SENTINEL LYMPH NODE BIOPSY; RIGHT PROPHYLACTIC SKIN SPARING MASTECTOMY [name] 07/16/2017  . MASTECTOMY WITH RADIOACTIVE SEED GUIDED EXCISION AND AXILLARY SENTINEL LYMPH NODE BIOPSY Left 07/16/2017   Procedure: LEFT SKIN SPARING MASTECTOMY WITH LEFT RADIOACTIVE SEED TARGETED AXILLARY LYMPH NODE EXCISION AND AXILLARY SENTINEL LYMPH NODE BIOPSY;  Surgeon: [name], MD;  Location: [facility];  Service: General;  Laterality: Left;   . NIPPLE SPARING MASTECTOMY Right 07/16/2017   Procedure: RIGHT PROPHYLACTIC SKIN SPARING MASTECTOMY;  Surgeon: [name], MD;  Location: [facility];  Service: General;  Laterality: Right;  . PORT-A-CATH REMOVAL N/A 07/16/2017   Procedure: REMOVAL PORT-A-CATH;  Surgeon: [name], MD;  Location: [facility];  Service: General;  Laterality: N/A;  . PORTA CATH REMOVAL  07/16/2017  . PORTACATH PLACEMENT N/A 01/14/2017   Procedure: INSERTION PORT-A-CATH WITH [place];  Surgeon: [name], MD;  Location: [facility];  Service: General;  Laterality: N/A;    There were no vitals filed for this visit.  Subjective Assessment - 09/21/17 1602    Subjective  I have been feeling fatigued since the radiation. My exercises are going well.     Pertinent History  Diagnosed in August 2018  on Feb 7,   bilateral mastectomy with 3 nodes removed on the left and immediate bilateral expanders, had chemo first  and will have radiatoin that starts next week     Patient Stated Goals  want to know what kind of exercises she can do and when she can start doing what she used to do before at work     Currently in Pain?  No/denies    Pain Score  0-No pain                       [facility] Adult PT Treatment/Exercise - 09/21/17 0001      Shoulder Exercises: Standing   Other Standing Exercises  Instructed pt in entire Strength ABC program holding all stretches 30 sec and doing all exercises x 10 reps with 2 lb weights- issued verbal cues to perform exercises correctly and in good form                  PT Long Term Goals - 09/21/17 1603      PT LONG TERM GOAL #1   Title  Pt will have 150 degrees of left shoulder abduction so that she can easily get in and out of position needed for radiation     Baseline  120 degrees, 09/10/17- 176    Time  4    Period  Weeks    Status  Achieved      PT LONG TERM GOAL #2   Title  Pt will be independent in a home program for shoudler ROM and progressive  strengthening     Baseline  no knowledge, 09/21/17- pt feels independent with this    Time  4    Period  Weeks    Status  Achieved      PT LONG TERM GOAL #3   Title  Pt will verbalized strategies to decrease lymphedema risk     Baseline  09/10/17- pt is independent in this    Time  4    Period  Weeks    Status  Achieved            Plan - 09/21/17 1608    Clinical Impression Statement  Pt states she has been doing the supine scapular series at home but has not tried the Strength ABC program. Instructed pt in entire Strength ABC program and gave verbal cues to perform exercises correctly and will good form. Pt has now met all goals for therapy and is ready for discharge from skilled PT services at this time.     Rehab Potential  Good    Clinical Impairments Affecting Rehab Potential  3 nodes removed, in radiation currently    PT Frequency  1x / week    PT Duration  4 weeks    PT Treatment/Interventions  ADLs/Self Care Home Management;Scar mobilization;Passive range of motion;Patient/family education;Orthotic Fit/Training;Therapeutic activities;Therapeutic exercise;Taping    PT Next Visit Plan  dc this vist    PT Home Exercise Plan  dowel rod flexion and abduction, supine scap series, Strength ABC program    Consulted and Agree with Plan of Care  Patient       Patient will benefit from skilled therapeutic intervention in order to improve the following deficits and impairments:  Decreased skin integrity, Increased fascial restricitons, Postural dysfunction, Decreased scar mobility, Decreased range of motion, Decreased strength, Impaired perceived functional ability, Impaired UE functional use  Visit Diagnosis: Stiffness of left shoulder, not elsewhere classified  Stiffness of right shoulder, not elsewhere classified  Muscle weakness (generalized)  Abnormal posture     Problem List Patient Active Problem List   Diagnosis Date Noted  . UTI (urinary tract infection) 04/24/2017   . 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  . Genetic testing 03/05/2017  . Port catheter in place 01/30/2017  . Malignant neoplasm of upper-outer quadrant of left breast in female, estrogen receptor positive ([place]) 01/05/2017  . Tobacco use 12/12/2016    Allyson Sabal [facility] 09/21/2017, 4:36 PM  [place], [place], [address] Phone: [phone]   Fax:  [phone]  Name: Kathryn Stanley MRN: [id number] Date of Birth: Dec 11, 1975  PHYSICAL THERAPY DISCHARGE SUMMARY  Visits from Start of Care: 4  Current functional level related to goals / functional outcomes: Pt has met all goals   Remaining deficits: None   Education / Equipment: HEP  Plan: Patient agrees to discharge.  Patient goals were met. Patient is being discharged due to meeting the stated rehab goals.  ?????    Allyson Sabal [place], [place] 09/21/17 4:37 PM

## 2017-09-22 ENCOUNTER — Ambulatory Visit
Admission: RE | Admit: 2017-09-22 | Discharge: 2017-09-22 | Disposition: A | Discharge status: Home / Self Care | Payer: Medicaid Other | Source: Ambulatory Visit | Attending: Radiation Oncology | Admitting: Radiation Oncology

## 2017-09-22 ENCOUNTER — Ambulatory Visit: Payer: Medicaid Other

## 2017-09-22 DIAGNOSIS — Z510 Encounter for antineoplastic radiation therapy: Secondary | ICD-10-CM | POA: Diagnosis not present

## 2017-09-23 ENCOUNTER — Ambulatory Visit: Payer: Medicaid Other

## 2017-09-23 ENCOUNTER — Ambulatory Visit
Admission: RE | Admit: 2017-09-23 | Discharge: 2017-09-23 | Disposition: A | Discharge status: Home / Self Care | Payer: Medicaid Other | Source: Ambulatory Visit | Attending: Radiation Oncology | Admitting: Radiation Oncology

## 2017-09-23 DIAGNOSIS — Z510 Encounter for antineoplastic radiation therapy: Secondary | ICD-10-CM | POA: Diagnosis not present

## 2017-09-24 ENCOUNTER — Telehealth: Payer: Self-pay | Admitting: Hematology

## 2017-09-24 ENCOUNTER — Ambulatory Visit
Admission: RE | Admit: 2017-09-24 | Discharge: 2017-09-24 | Disposition: A | Discharge status: Home / Self Care | Payer: Medicaid Other | Source: Ambulatory Visit | Attending: Radiation Oncology | Admitting: Radiation Oncology

## 2017-09-24 ENCOUNTER — Ambulatory Visit: Payer: Medicaid Other

## 2017-09-24 DIAGNOSIS — Z510 Encounter for antineoplastic radiation therapy: Secondary | ICD-10-CM | POA: Diagnosis not present

## 2017-09-24 NOTE — Telephone Encounter (Signed)
Per 4/10 LOS patient needed ref to [facility] who accepts Medicaid.  I have unable to locate and patient has been notified

## 2017-09-25 ENCOUNTER — Ambulatory Visit
Admission: RE | Admit: 2017-09-25 | Discharge: 2017-09-25 | Disposition: A | Discharge status: Home / Self Care | Payer: Medicaid Other | Source: Ambulatory Visit | Attending: Radiation Oncology | Admitting: Radiation Oncology

## 2017-09-25 ENCOUNTER — Ambulatory Visit: Payer: Medicaid Other

## 2017-09-25 DIAGNOSIS — Z510 Encounter for antineoplastic radiation therapy: Secondary | ICD-10-CM | POA: Diagnosis not present

## 2017-09-28 ENCOUNTER — Ambulatory Visit: Payer: Medicaid Other

## 2017-09-28 ENCOUNTER — Ambulatory Visit
Admission: RE | Admit: 2017-09-28 | Discharge: 2017-09-28 | Disposition: A | Discharge status: Home / Self Care | Payer: Medicaid Other | Source: Ambulatory Visit | Attending: Radiation Oncology | Admitting: Radiation Oncology

## 2017-09-28 DIAGNOSIS — Z510 Encounter for antineoplastic radiation therapy: Secondary | ICD-10-CM | POA: Diagnosis not present

## 2017-09-29 ENCOUNTER — Ambulatory Visit: Payer: Medicaid Other

## 2017-09-29 ENCOUNTER — Ambulatory Visit
Admission: RE | Admit: 2017-09-29 | Discharge: 2017-09-29 | Disposition: A | Discharge status: Home / Self Care | Payer: Medicaid Other | Source: Ambulatory Visit | Attending: Radiation Oncology | Admitting: Radiation Oncology

## 2017-09-29 DIAGNOSIS — Z510 Encounter for antineoplastic radiation therapy: Secondary | ICD-10-CM | POA: Diagnosis not present

## 2017-09-30 ENCOUNTER — Other Ambulatory Visit: Payer: Self-pay

## 2017-09-30 ENCOUNTER — Inpatient Hospital Stay: Payer: Medicaid Other

## 2017-09-30 ENCOUNTER — Inpatient Hospital Stay (HOSPITAL_BASED_OUTPATIENT_CLINIC_OR_DEPARTMENT_OTHER): Payer: Medicaid Other | Admitting: Nurse Practitioner

## 2017-09-30 ENCOUNTER — Ambulatory Visit: Payer: Medicaid Other

## 2017-09-30 ENCOUNTER — Telehealth: Payer: Self-pay

## 2017-09-30 ENCOUNTER — Telehealth: Payer: Self-pay | Admitting: Hematology

## 2017-09-30 ENCOUNTER — Encounter: Payer: Self-pay | Admitting: Nurse Practitioner

## 2017-09-30 ENCOUNTER — Ambulatory Visit
Admission: RE | Admit: 2017-09-30 | Discharge: 2017-09-30 | Disposition: A | Discharge status: Home / Self Care | Payer: Medicaid Other | Source: Ambulatory Visit | Attending: Radiation Oncology | Admitting: Radiation Oncology

## 2017-09-30 VITALS — BP 122/67 | HR 103 | Temp 98.2°F | Resp 18 | Ht 64.0 in | Wt 204.7 lb

## 2017-09-30 DIAGNOSIS — R740 Nonspecific elevation of levels of transaminase and lactic acid dehydrogenase [LDH]: Secondary | ICD-10-CM | POA: Diagnosis not present

## 2017-09-30 DIAGNOSIS — C50412 Malignant neoplasm of upper-outer quadrant of left female breast: Secondary | ICD-10-CM

## 2017-09-30 DIAGNOSIS — R300 Dysuria: Secondary | ICD-10-CM

## 2017-09-30 DIAGNOSIS — Z1501 Genetic susceptibility to malignant neoplasm of breast: Secondary | ICD-10-CM

## 2017-09-30 DIAGNOSIS — M7989 Other specified soft tissue disorders: Secondary | ICD-10-CM

## 2017-09-30 DIAGNOSIS — Z9011 Acquired absence of right breast and nipple: Secondary | ICD-10-CM

## 2017-09-30 DIAGNOSIS — R948 Abnormal results of function studies of other organs and systems: Secondary | ICD-10-CM | POA: Diagnosis not present

## 2017-09-30 DIAGNOSIS — N3000 Acute cystitis without hematuria: Secondary | ICD-10-CM

## 2017-09-30 DIAGNOSIS — Z923 Personal history of irradiation: Secondary | ICD-10-CM

## 2017-09-30 DIAGNOSIS — F17210 Nicotine dependence, cigarettes, uncomplicated: Secondary | ICD-10-CM

## 2017-09-30 DIAGNOSIS — R232 Flushing: Secondary | ICD-10-CM | POA: Diagnosis not present

## 2017-09-30 DIAGNOSIS — R635 Abnormal weight gain: Secondary | ICD-10-CM | POA: Diagnosis not present

## 2017-09-30 DIAGNOSIS — C773 Secondary and unspecified malignant neoplasm of axilla and upper limb lymph nodes: Secondary | ICD-10-CM | POA: Diagnosis not present

## 2017-09-30 DIAGNOSIS — Z170 Estrogen receptor positive status [ER+]: Principal | ICD-10-CM

## 2017-09-30 DIAGNOSIS — K219 Gastro-esophageal reflux disease without esophagitis: Secondary | ICD-10-CM

## 2017-09-30 DIAGNOSIS — F39 Unspecified mood [affective] disorder: Secondary | ICD-10-CM

## 2017-09-30 DIAGNOSIS — Z9221 Personal history of antineoplastic chemotherapy: Secondary | ICD-10-CM

## 2017-09-30 DIAGNOSIS — Z171 Estrogen receptor negative status [ER-]: Secondary | ICD-10-CM

## 2017-09-30 DIAGNOSIS — L03032 Cellulitis of left toe: Secondary | ICD-10-CM

## 2017-09-30 DIAGNOSIS — Z95828 Presence of other vascular implants and grafts: Secondary | ICD-10-CM

## 2017-09-30 DIAGNOSIS — R21 Rash and other nonspecific skin eruption: Secondary | ICD-10-CM

## 2017-09-30 DIAGNOSIS — Z510 Encounter for antineoplastic radiation therapy: Secondary | ICD-10-CM | POA: Diagnosis not present

## 2017-09-30 LAB — CBC WITH DIFFERENTIAL/PLATELET
BASOS PCT: 1 %
Basophils Absolute: 0 10*3/uL (ref 0.0–0.1)
Eosinophils Absolute: 0.1 10*3/uL (ref 0.0–0.5)
Eosinophils Relative: 4 %
HEMATOCRIT: 36.6 % (ref 34.8–46.6)
HEMOGLOBIN: 12.4 g/dL (ref 11.6–15.9)
LYMPHS ABS: 0.7 10*3/uL — AB (ref 0.9–3.3)
Lymphocytes Relative: 17 %
MCH: 27.6 pg (ref 25.1–34.0)
MCHC: 34 g/dL (ref 31.5–36.0)
MCV: 81.2 fL (ref 79.5–101.0)
MONOS PCT: 10 %
Monocytes Absolute: 0.4 10*3/uL (ref 0.1–0.9)
NEUTROS ABS: 2.9 10*3/uL (ref 1.5–6.5)
NEUTROS PCT: 68 %
Platelets: 197 10*3/uL (ref 145–400)
RBC: 4.5 MIL/uL (ref 3.70–5.45)
RDW: 14 % (ref 11.2–14.5)
WBC: 4.3 10*3/uL (ref 3.9–10.3)

## 2017-09-30 LAB — COMPREHENSIVE METABOLIC PANEL
ALBUMIN: 3.9 g/dL (ref 3.5–5.0)
ALT: 44 U/L (ref 0–55)
ANION GAP: 9 (ref 3–11)
AST: 24 U/L (ref 5–34)
Alkaline Phosphatase: 80 U/L (ref 40–150)
BUN: 20 mg/dL (ref 7–26)
CO2: 26 mmol/L (ref 22–29)
Calcium: 9 mg/dL (ref 8.4–10.4)
Chloride: 104 mmol/L (ref 98–109)
Creatinine, Ser: 0.93 mg/dL (ref 0.60–1.10)
GFR calc non Af Amer: >60 mL/min (ref >60)
GLUCOSE: 113 mg/dL (ref 70–140)
POTASSIUM: 3.5 mmol/L (ref 3.5–5.1)
SODIUM: 139 mmol/L (ref 136–145)
Total Bilirubin: 0.3 mg/dL (ref 0.2–1.2)
Total Protein: 7 g/dL (ref 6.4–8.3)

## 2017-09-30 LAB — URINALYSIS, COMPLETE (UACMP) WITH MICROSCOPIC
Bilirubin Urine: NEGATIVE
GLUCOSE, UA: NEGATIVE mg/dL
Ketones, ur: NEGATIVE mg/dL
NITRITE: NEGATIVE
PROTEIN: NEGATIVE mg/dL
Specific Gravity, Urine: 1.019 (ref 1.005–1.030)
pH: 5 (ref 5.0–8.0)

## 2017-09-30 MED ORDER — NITROFURANTOIN MONOHYD MACRO 100 MG PO CAPS: 100.0000 mg | ORAL_CAPSULE | Freq: Two times a day (BID) | ORAL | 0 refills | Status: DC

## 2017-09-30 MED FILL — NITROFURANTOIN MONO-MCR 100: 100 | 5 days supply | Qty: 10 | Fill #0

## 2017-09-30 NOTE — Telephone Encounter (Signed)
Spoke with patient instructed patient to come in now for lab and office visit with Bella Kennedy NP.

## 2017-09-30 NOTE — Progress Notes (Signed)
[place]  [phone] [phone]  Clinic Follow up Note   Patient Care Team: [name], MD as PCP - General (Internal Medicine) [name], MD as Consulting Physician (General Surgery) [name], MD as Consulting Physician (Hematology) [name], MD as Consulting Physician (Radiation Oncology) 09/30/2017  CHIEF COMPLAINT: bladder infection  SUMMARY OF ONCOLOGIC HISTORY: Oncology History   Cancer Staging Malignant neoplasm of upper-outer quadrant of left breast in female, estrogen receptor positive ([place]) Staging form: Breast, AJCC 8th Edition - Clinical stage from 12/29/2016: Stage IIIA (cT2, cN1, cM0, G3, ER: Positive, PR: Negative, HER2: Negative) - Signed by [name], MD on 01/07/2017 - Pathologic: No Stage Recommended (ypT1c, pN0, cM0, G2, ER-, PR-, HER2-) - Unsigned Cancer Staging Malignant neoplasm of upper-outer quadrant of left breast in female, estrogen receptor positive ([place]) Staging form: Breast, AJCC 8th Edition - Clinical stage from 12/29/2016: Stage IIIA (cT2, cN1, cM0, G3, ER: Positive, PR: Negative, HER2: Negative) - Signed by [name], MD on 01/07/2017 - Pathologic: No Stage Recommended (ypT1c, pN0, cM0, G2, ER-, PR-, HER2-) - Unsigned       Malignant neoplasm of upper-outer quadrant of left breast in female, estrogen receptor positive ([place])   12/25/2016 Mammogram    [place] and [facility] Diagnostic Breast Tomo Bilateral 12/25/16 IMPRESSION: 1. Suspicious mass 2.3 x 1.6 x 3.0 cm  in the 130 o'clock location of the left breast 8cm from the nipple, biopsy recommended. Adjacent simple cyst is 1.9 cm. 2. Suspicious left axillary lymph node 2.2cm for which biopsy is indicated. 3. Indeterminate group of calcifications in the upper central portion of the left breast for which biopsy is recommended.       12/29/2016 Initial Biopsy    Diagnosis 12/29/16 1. Breast, left, needle core biopsy, stereotactic, upper inner quadrant -  FIBROCYSTIC CHANGES INCLUDING APOCRINE METAPLASIA WITH CALCIFICATIONS - NO MALIGNANCY IDENTIFIED 2. Breast, left, needle core biopsy, ultrasound, 1:30 o'clock - INVASIVE MAMMARY CARCINOMA, G3 - SEE COMMENT 3. Lymph node, needle/core biopsy, ultrasound, left axillary - METASTIC CARCINOMA INVOLVING ONE LYMPH NODE (1/1) E-cadherin is positive supporting a ductal origin.       12/29/2016 Receptors her2    ER 15%+, weak staining  PR - HER2- Ki67 10%       12/29/2016 Initial Diagnosis    Malignant neoplasm of upper-outer quadrant of left breast in female, estrogen receptor positive ([place])      01/13/2017 Echocardiogram    ECHO 01/13/17 Study Conclusions - Left ventricle: The cavity size was normal. Systolic function was   normal. The estimated ejection fraction was in the range of 60%   to 65%. Wall motion was normal; there were no regional wall   motion abnormalities. Left ventricular diastolic function   parameters were normal. - Mitral valve: There was trivial regurgitation. - Right atrium: The atrium was mildly dilated. - Tricuspid valve: There was trivial regurgitation.       01/14/2017 Surgery    Port placement by Dr. Donne Hazel       01/15/2017 Imaging    Whole Body bone Scan 01/15/17 IMPRESSION: 1.  No significant abnormality identified.      01/15/2017 Imaging    CT CAP W Contrast 01/15/17 IMPRESSION: 1. Left lateral breast mass with pathologic left axillary adenopathy including a 2.0 cm left axillary lymph node. 2. Several hypodense lesions in the liver are likely benign lesions such as cysts. However, these are technically too small to characterize by CT. Possibilities for further workup include surveillance  or hepatic protocol MRI with and without contrast. 3. There is also a tiny hypodense lesion in the spleen which is technically nonspecific but statistically highly likely to be benign. 4.  Prominent stool throughout the colon favors constipation      01/16/2017 -  06/05/2017 Chemotherapy    Neoadjuvant chemo AC every 2 weeks for 4 cycles starting on 01/16/2017 and ended 02/27/17. Then proceed with weekly taxol for 12 treatments starting 03/13/17. Added Carboplatin AUC 2 on 04/03/17 to weekly taxol. Due to symptomatic thrombocytopenia we held Carbo since cycle 8. Cycle 9 was post-poned due to neutropenia and Granix was added starting 05/12/17. Added carbo back at reduced dose AUC 1.5 with cycle 10 on 05/22/17. 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   Analysis also detected a Variant of Uncertain Significance (VUS) in the NBN gene called c.628G>T (p.Val210Phe)      06/12/2017 Imaging    MR BREAST BILATERAL WO CONTRAST IMPRESSION: 1. Significant positive response to neoadjuvant chemotherapy. The index carcinoma in the left breast has significantly decreased in size. Left axillary adenopathy has also significantly improved. 2. No other evidence of malignancy.      07/16/2017 Surgery    Pt underwent a left mastectomy with Dr. Donne Hazel      07/16/2017 Pathology Results    Diagnosis 07/16/17 1. Breast, simple mastectomy, Right Prophylactic - FIBROCYSTIC CHANGES INCLUDING APOCRINE METAPLASIA - DUCT ECTASIA - CALCIFICATIONS - NO MALIGNANCY IDENTIFIED 2. Lymph node, sentinel, biopsy, Left Axillary - NO CARCINOMA IDENTIFIED IN ONE LYMPH NODE (0/1) - SEE COMMENT 3. Lymph node, sentinel, biopsy, Left - NO CARCINOMA IDENTIFIED IN ONE LYMPH NODE (0/1) - SEE COMMENT 4. Breast, simple mastectomy, Left - INVASIVE DUCTAL CARCINOMA, NOTTINGHAM GRADE 2 OF 3, 1.3 CM - MARGINS UNINVOLVED BY CARCINOMA (2 CM POSTERIOR MARGIN) - PREVIOUS BIOPSY SITE CHANGES - CALCIFICATIONS - SEE ONCOLOGY TABLE BELOW      07/16/2017 Receptors her2    Results: IMMUNOHISTOCHEMICAL AND MORPHOMETRIC ANALYSIS PERFORMED MANUALLY Estrogen Receptor: 0%,  NEGATIVE Progesterone Receptor: 0%, NEGATIVE Results: HER2 - NEGATIVE RATIO OF HER2/CEP17 SIGNALS 1.73 AVERAGE HER2 COPY NUMBER PER CELL 2.67      09/02/2017 -  Radiation Therapy    Adjuvant radiation per Dr. Lisbeth Benyo      CURRENT THERAPY: adjuvant radiation   INTERVAL HISTORY: Ms. Streeter returns for unscheduled visit for "bladder infection." Has had multiple previous urinary tract infections beginning in childhood and treated with macrobid at that time. She went for many years without an infection. However they became recurrent on chemotherapy. This morning she woke up in pain with urinary urgency, frequency, and little output. Urine is cloudy, not dark or bloody. She has been increasing water intake and took oral cranberry tablets today. She is currently undergoing radiation therapy, left reconstructed breast and axilla is red and tender. She applies lotion per rad onc.   REVIEW OF SYSTEMS:   Constitutional: Denies fevers, chills or abnormal weight loss Gastrointestinal:  Denies pelvic or back pain GU: (+) urinary frequency (+) urgency (+) cloudy urine (+) little output  Skin: (+) redness and darkening to left reconstructed breast, chest wall, and axilla  All other systems were reviewed with the patient and are negative.  MEDICAL HISTORY:  Past Medical History:  Diagnosis Date  . Breast cancer, left breast ([place]) dx'd 01/2017  . Dermatologic problem  possible psoriasis. Pt has not had been diagnosed by dermatology.  . 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 GERD (gastroesophageal reflux disease)   . Tobacco dependence    trying to quit with nicotene patches    SURGICAL HISTORY: Past Surgical History:  Procedure Laterality Date  . BREAST BIOPSY Left 12/2016  . BREAST RECONSTRUCTION Bilateral 07/16/2017   BILATERAL BREAST RECONSTRUCTION WITH PLACEMENT OF TISSUE EXPANDER AND ALLODERM/notes 07/16/2017  .  CESAREAN SECTION  2002  . MASTECTOMY Bilateral 07/16/2017   LEFT SKIN SPARING MASTECTOMY WITH LEFT RADIOACTIVE SEED TARGETED AXILLARY LYMPH NODE EXCISION AND AXILLARY SENTINEL LYMPH NODE BIOPSY; RIGHT PROPHYLACTIC SKIN SPARING MASTECTOMY [name] 07/16/2017  . MASTECTOMY WITH RADIOACTIVE SEED GUIDED EXCISION AND AXILLARY SENTINEL LYMPH NODE BIOPSY Left 07/16/2017   Procedure: LEFT SKIN SPARING MASTECTOMY WITH LEFT RADIOACTIVE SEED TARGETED AXILLARY LYMPH NODE EXCISION AND AXILLARY SENTINEL LYMPH NODE BIOPSY;  Surgeon: [name], MD;  Location: [facility];  Service: General;  Laterality: Left;  . NIPPLE SPARING MASTECTOMY Right 07/16/2017   Procedure: RIGHT PROPHYLACTIC SKIN SPARING MASTECTOMY;  Surgeon: [name], MD;  Location: [facility];  Service: General;  Laterality: Right;  . PORT-A-CATH REMOVAL N/A 07/16/2017   Procedure: REMOVAL PORT-A-CATH;  Surgeon: [name], MD;  Location: [facility];  Service: General;  Laterality: N/A;  . PORTA CATH REMOVAL  07/16/2017  . PORTACATH PLACEMENT N/A 01/14/2017   Procedure: INSERTION PORT-A-CATH WITH [place];  Surgeon: [name], MD;  Location: [facility];  Service: General;  Laterality: N/A;    I have reviewed the social history and family history with the patient and they are unchanged from previous note.  ALLERGIES:  is allergic to amoxicillin; chlorhexidine gluconate; and nicotine.  MEDICATIONS:  Current Outpatient Medications  Medication Sig Dispense Refill  . famotidine (PEPCID) 10 MG tablet Take 10 mg by mouth daily as needed for heartburn or indigestion.    Marland Kitchen ibuprofen (ADVIL,MOTRIN) 200 MG tablet Take 200-400 mg by mouth daily as needed for headache or moderate pain.    . nitrofurantoin, macrocrystal-monohydrate, (MACROBID) 100 MG capsule Take 1 capsule (100 mg total) by mouth 2 (two) times daily. 10 capsule 0   No current facility-administered medications for this visit.     Vitals:   09/30/17 1512  BP: 122/67  Pulse: (!) 103  Resp:  18  Temp: 98.2 F (36.8 C)  SpO2: 100%   Filed Weights   09/30/17 1512  Weight: 204 lb 11.2 oz (92.9 kg)    GENERAL:alert, no distress and comfortable SKIN: redness and darkening to left reconstructed breast, chest wall, and axilla  EYES: normal, Conjunctiva are pink and non-injected, sclera clear LUNGS: clear to auscultation with normal breathing effort HEART: regular rate & rhythm  ABDOMEN:abdomen soft, non-tender and normal bowel sounds. No suprapubic or CVA tenderness  NEURO: alert & oriented x 3 with fluent speech  LABORATORY DATA:  I have reviewed the data as listed CBC Latest Ref Rng & Units 09/30/2017 09/07/2017 08/03/2017  WBC 3.9 - 10.3 K/uL 4.3 4.5 4.1  Hemoglobin 11.6 - 15.9 g/dL 12.4 13.1 12.3  Hematocrit 34.8 - 46.6 % 36.6 39.3 37.1  Platelets 145 - 400 K/uL 197 199 199     CMP Latest Ref Rng & Units 09/30/2017 09/07/2017 08/03/2017  Glucose 70 - 140 mg/dL 113 88 130  BUN 7 - 26 mg/dL _0 Creatinine 0.60 - 1.10 mg/dL 0.93 0.92 0.85  Sodium 136 -  145 mmol/L 139 140 143  Potassium 3.5 - 5.1 mmol/L 3.5 4.1 3.8  Chloride 98 - 109 mmol/L 104 107 107  CO2 22 - 29 mmol/L _0 Calcium 8.4 - 10.4 mg/dL 9.0 9.4 9.5  Total Protein 6.4 - 8.3 g/dL 7.0 6.9 6.8  Total Bilirubin 0.2 - 1.2 mg/dL 0.3 0.2 0.3  Alkaline Phos 40 - 150 U/L 80 80 80  AST 5 - 34 U/L 24 32 26  ALT 0 - 55 U/L 44 63(H) 51      RADIOGRAPHIC STUDIES: I have personally reviewed the radiological images as listed and agreed with the findings in the report. No results found.   ASSESSMENT & PLAN:  Acute cystitis without hematuria I reviewed her UA which shows large leukocytes, >50 WBC, and bacteria. Will treat for symptomatic cystitis. She has completed several courses of cipro within the last 6 months. Was on bactrim recently in 07/2017. I prescribed nitrofurantoin macrocrystal-monohydrate 100 mg po BID x5 days. I encouraged her to increase water intake and call clinic if symptoms worsen or do not  improve on antibiotic. Will follow culture.   All questions were answered. The patient knows to call the clinic with any problems, questions or concerns. No barriers to learning was detected.     Alla Feeling, NP 09/30/17

## 2017-09-30 NOTE — Telephone Encounter (Signed)
Patient called thinking she has a bladder infection and needs to be seen for this.    No answer x 2.

## 2017-09-30 NOTE — Telephone Encounter (Signed)
No LOS 4/24

## 2017-10-01 ENCOUNTER — Ambulatory Visit: Payer: Medicaid Other

## 2017-10-01 ENCOUNTER — Ambulatory Visit
Admission: RE | Admit: 2017-10-01 | Discharge: 2017-10-01 | Disposition: A | Discharge status: Home / Self Care | Payer: Medicaid Other | Source: Ambulatory Visit | Attending: Radiation Oncology | Admitting: Radiation Oncology

## 2017-10-01 DIAGNOSIS — Z510 Encounter for antineoplastic radiation therapy: Secondary | ICD-10-CM | POA: Diagnosis not present

## 2017-10-01 LAB — URINE CULTURE

## 2017-10-02 ENCOUNTER — Ambulatory Visit
Admission: RE | Admit: 2017-10-02 | Discharge: 2017-10-02 | Disposition: A | Discharge status: Home / Self Care | Payer: Medicaid Other | Source: Ambulatory Visit | Attending: Radiation Oncology | Admitting: Radiation Oncology

## 2017-10-02 ENCOUNTER — Ambulatory Visit: Payer: Medicaid Other

## 2017-10-02 DIAGNOSIS — Z510 Encounter for antineoplastic radiation therapy: Secondary | ICD-10-CM | POA: Diagnosis not present

## 2017-10-05 ENCOUNTER — Ambulatory Visit
Admission: RE | Admit: 2017-10-05 | Discharge: 2017-10-05 | Disposition: A | Discharge status: Home / Self Care | Payer: Medicaid Other | Source: Ambulatory Visit | Attending: Radiation Oncology | Admitting: Radiation Oncology

## 2017-10-05 ENCOUNTER — Ambulatory Visit: Payer: Medicaid Other

## 2017-10-05 DIAGNOSIS — Z510 Encounter for antineoplastic radiation therapy: Secondary | ICD-10-CM | POA: Diagnosis not present

## 2017-10-06 ENCOUNTER — Ambulatory Visit
Admission: RE | Admit: 2017-10-06 | Discharge: 2017-10-06 | Disposition: A | Discharge status: Home / Self Care | Payer: Medicaid Other | Source: Ambulatory Visit | Attending: Radiation Oncology | Admitting: Radiation Oncology

## 2017-10-06 ENCOUNTER — Ambulatory Visit: Payer: Medicaid Other

## 2017-10-06 DIAGNOSIS — Z510 Encounter for antineoplastic radiation therapy: Secondary | ICD-10-CM | POA: Diagnosis not present

## 2017-10-07 ENCOUNTER — Ambulatory Visit
Admission: RE | Admit: 2017-10-07 | Discharge: 2017-10-07 | Disposition: A | Discharge status: Home / Self Care | Payer: Medicaid Other | Source: Ambulatory Visit | Attending: Radiation Oncology | Admitting: Radiation Oncology

## 2017-10-07 ENCOUNTER — Ambulatory Visit: Payer: Medicaid Other

## 2017-10-07 DIAGNOSIS — Z170 Estrogen receptor positive status [ER+]: Secondary | ICD-10-CM | POA: Diagnosis not present

## 2017-10-07 DIAGNOSIS — C50412 Malignant neoplasm of upper-outer quadrant of left female breast: Secondary | ICD-10-CM | POA: Insufficient documentation

## 2017-10-07 DIAGNOSIS — Z510 Encounter for antineoplastic radiation therapy: Secondary | ICD-10-CM | POA: Diagnosis present

## 2017-10-08 ENCOUNTER — Ambulatory Visit: Payer: Medicaid Other

## 2017-10-08 ENCOUNTER — Ambulatory Visit
Admission: RE | Admit: 2017-10-08 | Discharge: 2017-10-08 | Disposition: A | Discharge status: Home / Self Care | Payer: Medicaid Other | Source: Ambulatory Visit | Attending: Radiation Oncology | Admitting: Radiation Oncology

## 2017-10-08 DIAGNOSIS — Z510 Encounter for antineoplastic radiation therapy: Secondary | ICD-10-CM | POA: Diagnosis not present

## 2017-10-09 ENCOUNTER — Ambulatory Visit: Payer: Medicaid Other

## 2017-10-09 ENCOUNTER — Ambulatory Visit
Admission: RE | Admit: 2017-10-09 | Discharge: 2017-10-09 | Disposition: A | Discharge status: Home / Self Care | Payer: Medicaid Other | Source: Ambulatory Visit | Attending: Radiation Oncology | Admitting: Radiation Oncology

## 2017-10-09 DIAGNOSIS — Z510 Encounter for antineoplastic radiation therapy: Secondary | ICD-10-CM | POA: Diagnosis not present

## 2017-10-09 DIAGNOSIS — C50412 Malignant neoplasm of upper-outer quadrant of left female breast: Secondary | ICD-10-CM

## 2017-10-09 DIAGNOSIS — Z170 Estrogen receptor positive status [ER+]: Principal | ICD-10-CM

## 2017-10-09 MED ORDER — RADIAPLEXRX EX GEL
Freq: Once | CUTANEOUS | Status: AC
  Administered 2017-10-09: 16:00:00 via TOPICAL

## 2017-10-12 ENCOUNTER — Encounter: Payer: Medicaid Other | Admitting: Physical Therapy

## 2017-10-12 ENCOUNTER — Ambulatory Visit
Admission: RE | Admit: 2017-10-12 | Discharge: 2017-10-12 | Disposition: A | Discharge status: Home / Self Care | Payer: Medicaid Other | Source: Ambulatory Visit | Attending: Radiation Oncology | Admitting: Radiation Oncology

## 2017-10-12 ENCOUNTER — Telehealth: Payer: Self-pay | Admitting: *Deleted

## 2017-10-12 DIAGNOSIS — Z510 Encounter for antineoplastic radiation therapy: Secondary | ICD-10-CM | POA: Diagnosis not present

## 2017-10-12 NOTE — Telephone Encounter (Signed)
Called and moved the patient's appt from May 15th to May 8th

## 2017-10-13 ENCOUNTER — Ambulatory Visit
Admission: RE | Admit: 2017-10-13 | Discharge: 2017-10-13 | Disposition: A | Discharge status: Home / Self Care | Payer: Medicaid Other | Source: Ambulatory Visit | Attending: Radiation Oncology | Admitting: Radiation Oncology

## 2017-10-13 DIAGNOSIS — Z510 Encounter for antineoplastic radiation therapy: Secondary | ICD-10-CM | POA: Diagnosis not present

## 2017-10-13 NOTE — Progress Notes (Signed)
Kathryn Stanley  [phone] [phone]  Clinic Follow-up Note   Patient Care Team: [name], MD as PCP - General (Internal Medicine) [name], MD as Consulting Physician (General Surgery) [name], MD as Consulting Physician (Hematology) [name], MD as Consulting Physician (Radiation Oncology)   Date of Service:  10/16/2017  CHIEF COMPLAINTS:  Follow-up for Malignant neoplasm of upper-outer quadrant of left breast in female, estrogen receptor positive   Oncology History   Cancer Staging Malignant neoplasm of upper-outer quadrant of left breast in female, estrogen receptor positive ([place]) Staging form: Breast, AJCC 8th Edition - Clinical stage from 12/29/2016: Stage IIIA (cT2, cN1, cM0, G3, ER: Positive, PR: Negative, HER2: Negative) - Signed by [name], MD on 01/07/2017 - Pathologic: No Stage Recommended (ypT1c, pN0, cM0, G2, ER-, PR-, HER2-) - Unsigned Cancer Staging Malignant neoplasm of upper-outer quadrant of left breast in female, estrogen receptor positive ([place]) Staging form: Breast, AJCC 8th Edition - Clinical stage from 12/29/2016: Stage IIIA (cT2, cN1, cM0, G3, ER: Positive, PR: Negative, HER2: Negative) - Signed by [name], MD on 01/07/2017 - Pathologic: No Stage Recommended (ypT1c, pN0, cM0, G2, ER-, PR-, HER2-) - Unsigned       Malignant neoplasm of upper-outer quadrant of left breast in female, estrogen receptor positive ([place])   12/25/2016 Mammogram    [place] and [facility] Diagnostic Breast Tomo Bilateral 12/25/16 IMPRESSION: 1. Suspicious mass 2.3 x 1.6 x 3.0 cm  in the 130 o'clock location of the left breast 8cm from the nipple, biopsy recommended. Adjacent simple cyst is 1.9 cm. 2. Suspicious left axillary lymph node 2.2cm for which biopsy is indicated. 3. Indeterminate group of calcifications in the upper central portion of the left breast for which biopsy is recommended.       12/29/2016 Initial Biopsy    Diagnosis  12/29/16 1. Breast, left, needle core biopsy, stereotactic, upper inner quadrant - FIBROCYSTIC CHANGES INCLUDING APOCRINE METAPLASIA WITH CALCIFICATIONS - NO MALIGNANCY IDENTIFIED 2. Breast, left, needle core biopsy, ultrasound, 1:30 o'clock - INVASIVE MAMMARY CARCINOMA, G3 - SEE COMMENT 3. Lymph node, needle/core biopsy, ultrasound, left axillary - METASTIC CARCINOMA INVOLVING ONE LYMPH NODE (1/1) E-cadherin is positive supporting a ductal origin.       12/29/2016 Receptors her2    ER 15%+, weak staining  PR - HER2- Ki67 10%       12/29/2016 Initial Diagnosis    Malignant neoplasm of upper-outer quadrant of left breast in female, estrogen receptor positive ([place])      01/13/2017 Echocardiogram    ECHO 01/13/17 Study Conclusions - Left ventricle: The cavity size was normal. Systolic function was   normal. The estimated ejection fraction was in the range of 60%   to 65%. Wall motion was normal; there were no regional wall   motion abnormalities. Left ventricular diastolic function   parameters were normal. - Mitral valve: There was trivial regurgitation. - Right atrium: The atrium was mildly dilated. - Tricuspid valve: There was trivial regurgitation.       01/14/2017 Surgery    Port placement by Dr. Donne Hazel       01/15/2017 Imaging    Whole Body bone Scan 01/15/17 IMPRESSION: 1.  No significant abnormality identified.      01/15/2017 Imaging    CT CAP W Contrast 01/15/17 IMPRESSION: 1. Left lateral breast mass with pathologic left axillary adenopathy including a 2.0 cm left axillary lymph node. 2. Several hypodense lesions in the liver are likely benign lesions such as cysts.  However, these are technically too small to characterize by CT. Possibilities for further workup include surveillance or hepatic protocol MRI with and without contrast. 3. There is also a tiny hypodense lesion in the spleen which is technically nonspecific but statistically highly likely to  be benign. 4.  Prominent stool throughout the colon favors constipation      01/16/2017 - 06/05/2017 Chemotherapy    Neoadjuvant chemo AC every 2 weeks for 4 cycles starting on 01/16/2017 and ended 02/27/17. Then proceed with weekly taxol for 12 treatments starting 03/13/17. Added Carboplatin AUC 2 on 04/03/17 to weekly taxol. Due to symptomatic thrombocytopenia we held Carbo since cycle 8. Cycle 9 was post-poned due to neutropenia and Granix was added starting 05/12/17. Added carbo back at reduced dose AUC 1.5 with cycle 10 on 05/22/17. 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   Analysis also detected a Variant of Uncertain Significance (VUS) in the NBN gene called c.628G>T (p.Val210Phe)      06/12/2017 Imaging    MR BREAST BILATERAL WO CONTRAST IMPRESSION: 1. Significant positive response to neoadjuvant chemotherapy. The index carcinoma in the left breast has significantly decreased in size. Left axillary adenopathy has also significantly improved. 2. No other evidence of malignancy.      07/16/2017 Surgery    Pt underwent a left mastectomy with Dr. Donne Hazel      07/16/2017 Pathology Results    Diagnosis 07/16/17 1. Breast, simple mastectomy, Right Prophylactic - FIBROCYSTIC CHANGES INCLUDING APOCRINE METAPLASIA - DUCT ECTASIA - CALCIFICATIONS - NO MALIGNANCY IDENTIFIED 2. Lymph node, sentinel, biopsy, Left Axillary - NO CARCINOMA IDENTIFIED IN ONE LYMPH NODE (0/1) - SEE COMMENT 3. Lymph node, sentinel, biopsy, Left - NO CARCINOMA IDENTIFIED IN ONE LYMPH NODE (0/1) - SEE COMMENT 4. Breast, simple mastectomy, Left - INVASIVE DUCTAL CARCINOMA, NOTTINGHAM GRADE 2 OF 3, 1.3 CM - MARGINS UNINVOLVED BY CARCINOMA (2 CM POSTERIOR MARGIN) - PREVIOUS BIOPSY SITE CHANGES - CALCIFICATIONS - SEE ONCOLOGY TABLE BELOW      07/16/2017 Receptors her2     Results: IMMUNOHISTOCHEMICAL AND MORPHOMETRIC ANALYSIS PERFORMED MANUALLY Estrogen Receptor: 0%, NEGATIVE Progesterone Receptor: 0%, NEGATIVE Results: HER2 - NEGATIVE RATIO OF HER2/CEP17 SIGNALS 1.73 AVERAGE HER2 COPY NUMBER PER CELL 2.67      09/02/2017 -  Radiation Therapy    Adjuvant radiation per Dr. Lisbeth Bambrick         HISTORY OF PRESENTING ILLNESS: 01/07/17 Kathryn Stanley 42 y.o. female is here because of newly diagnosed Malignant neoplasm of upper-outer quadrant of left breast in female, estrogen receptor positive. She presents to the breast clinic today with her common law husband. She felt the lump herself 3 months ago. She felt pain like "stabbing" first. She then had a mammogram 12/25/16 and subsequent biopsy   In the past she was using smoking match and quit smoking "cold [place]" that was 1 month ago.   Today she reports her breast pain still comes and goes. She has not noticed any new changes. 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 Lately has regular period. She has a weak cervix and her children were born premature. She is currently working at [employer] and works on her feet all the time, working 5 days a week.   GYN HISTORY  Menarchal: 10 LMP:  12/10/16 Contraceptive: No HRT: NA GP: G1P2 - twins    CURRENT THERAPY:  Adjuvant radiation therapy with Dr Lisbeth Vanauken starting on 09/02/17, plan to complete 10/16/2017  INTERVAL HISTORY:  PIERRE CUMPTON is here for a follow-up. She is accompanied by her husband and her daughter today. She completed radiation treatment today, 10/16/2017. She denies any issues with radiation and she has been able to work full time during treatments. She is doing well overall.   She is scheduled her hysterectomy and BSO on 11/03/17. She also has a follow up with hre plastic surgeon in August 2019 regarding reconstruction surgery.   On review of systems, she reports mild drainage to  her left axilla, hyperpigmentation changes to her radiation sites. She denies any other symptoms. Pertinent positives are listed and detailed within the above HPI.   MEDICAL HISTORY:  Past Medical History:  Diagnosis Date   Breast cancer, left breast ([place]) dx'd 01/2017   Dermatologic problem    possible psoriasis. Pt has not had been diagnosed by dermatology.   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   GERD (gastroesophageal reflux disease)    Tobacco dependence    trying to quit with nicotene patches    SURGICAL HISTORY: Past Surgical History:  Procedure Laterality Date   BREAST BIOPSY Left 12/2016   BREAST RECONSTRUCTION Bilateral 07/16/2017   BILATERAL BREAST RECONSTRUCTION WITH PLACEMENT OF TISSUE EXPANDER AND ALLODERM/notes 07/16/2017   CESAREAN SECTION  2002   MASTECTOMY Bilateral 07/16/2017   LEFT SKIN SPARING MASTECTOMY WITH LEFT RADIOACTIVE SEED TARGETED AXILLARY LYMPH NODE EXCISION AND AXILLARY SENTINEL LYMPH NODE BIOPSY; RIGHT PROPHYLACTIC SKIN SPARING MASTECTOMY [name] 07/16/2017   MASTECTOMY WITH RADIOACTIVE SEED GUIDED EXCISION AND AXILLARY SENTINEL LYMPH NODE BIOPSY Left 07/16/2017   Procedure: LEFT SKIN SPARING MASTECTOMY WITH LEFT RADIOACTIVE SEED TARGETED AXILLARY LYMPH NODE EXCISION AND AXILLARY SENTINEL LYMPH NODE BIOPSY;  Surgeon: [name], MD;  Location: [facility] OR;  Service: General;  Laterality: Left;   NIPPLE SPARING MASTECTOMY Right 07/16/2017   Procedure: RIGHT PROPHYLACTIC SKIN SPARING MASTECTOMY;  Surgeon: [name], MD;  Location: [facility] OR;  Service: General;  Laterality: Right;   PORT-A-CATH REMOVAL N/A 07/16/2017   Procedure: REMOVAL PORT-A-CATH;  Surgeon: [name], MD;  Location: [facility] OR;  Service: General;  Laterality: N/A;   PORTA CATH REMOVAL  07/16/2017   PORTACATH PLACEMENT N/A 01/14/2017   Procedure: [place] WITH [place];  Surgeon: [name], MD;  Location: [place];  Service: General;  Laterality: N/A;    SOCIAL HISTORY: Social History   Socioeconomic History   Marital status: Married    Spouse name: Not on file   Number of children: Not on file   Years of education: Not on file   Highest education level: Not on file  Occupational History   Not on file  Social Needs   Financial resource strain: Not on file   Food insecurity:    Worry: Not on file    Inability: Not on file   Transportation needs:    Medical: Not on file    Non-medical: Not on file  Tobacco Use   Smoking status: Former Smoker    Packs/day: 1.00    Years: 17.00    Pack years: 17.00    Types: Cigarettes    Last attempt to quit: 12/15/2016    Years since quitting: 0.8   Smokeless tobacco: Never Used  Substance and Sexual Activity  Alcohol use: No   Drug use: No   Sexual activity: Yes    Birth control/protection: None  Lifestyle   Physical activity:    Days per week: Not on file    Minutes per session: Not on file   Stress: Not on file  Relationships   Social connections:    Talks on phone: Not on file    Gets together: Not on file    Attends religious service: Not on file    Active member of club or organization: Not on file    Attends meetings of clubs or organizations: Not on file    Relationship status: Not on file   Intimate partner violence:    Fear of current or ex partner: Not on file    Emotionally abused: Not on file    Physically abused: Not on file    Forced sexual activity: Not on file  Other Topics Concern   Not on file  Social History Narrative   Not on file    FAMILY HISTORY: Family History  Problem Relation Age of Onset   Stroke Maternal Grandmother    Stroke Father    Hypertension Father    Other Father        Adopted    ALLERGIES:  is allergic to amoxicillin; chlorhexidine gluconate; and nicotine.  MEDICATIONS:  Current Outpatient Medications  Medication Sig Dispense Refill    famotidine (PEPCID) 10 MG tablet Take 10 mg by mouth daily as needed for heartburn or indigestion.     ibuprofen (ADVIL,MOTRIN) 200 MG tablet Take 200-400 mg by mouth daily as needed for headache or moderate pain.     No current facility-administered medications for this visit.    REVIEW OF SYSTEMS:  Constitutional: Denies abnormal night sweats. (+) hair growing  Eyes: Denies blurriness of vision, double vision or watery eyes Ears, nose, mouth, throat, and face: Denies mucositis or sore throat Respiratory: Denies dyspnea or wheezes   Cardiovascular: Denies palpitation, chest discomfort (+) persistent lower extremity swelling Gastrointestinal:  Denies nausea, heartburn.  Skin: Denies abnormal skin rashes Lymphatics: Denies new lymphadenopathy or easy bruising Neurological:Denies numbness, tingling or new weaknesses MSK: negative Breast: (+) Status post bilateral mastectomy with tissue expander, incisions are healing well without discharge or surrounding skin erythema Behavioral/Psych: Mood is stable, no new changes  All other systems were reviewed with the patient and are negative.  PHYSICAL EXAMINATION:  ECOG PERFORMANCE STATUS: 1  Vitals:   10/16/17 1333  BP: 137/68  Pulse: 88  Resp: 18  Temp: 97.7 F (36.5 C)  SpO2: 100%   Filed Weights   10/16/17 1333  Weight: 201 lb (91.2 kg)     GENERAL:alert, no distress and comfortable SKIN: skin color, texture, turgor are normal, no rashes or significant lesions EYES: normal, conjunctiva are pink and non-injected, sclera clear OROPHARYNX:no exudate, no erythema and lips, buccal mucosa, and tongue normal  NECK: supple, thyroid normal size, non-tender, without nodularity LYMPH:  no palpable lymphadenopathy in the cervical, axillary or inguinal LUNGS: clear to auscultation and percussion with normal breathing effort HEART: regular rate & rhythm and no murmurs and no lower extremity edema ABDOMEN:abdomen soft, non-tender and  normal bowel sounds Musculoskeletal:no cyanosis of digits and no clubbing  PSYCH: alert & oriented x 3 with fluent speech NEURO: no focal motor/sensory deficits Breast: Breast exam deferred today  LABORATORY DATA:  I have reviewed the data as listed CBC Latest Ref Rng & Units 09/30/2017 09/07/2017 08/03/2017  WBC 3.9 - 10.3 K/uL  4.3 4.5 4.1  Hemoglobin 11.6 - 15.9 g/dL 12.4 13.1 12.3  Hematocrit 34.8 - 46.6 % 36.6 39.3 37.1  Platelets 145 - 400 K/uL 197 199 199    CMP Latest Ref Rng & Units 09/30/2017 09/07/2017 08/03/2017  Glucose 70 - 140 mg/dL 113 88 130  BUN 7 - 26 mg/dL 20 14 23   Creatinine 0.60 - 1.10 mg/dL 0.93 0.92 0.85  Sodium 136 - 145 mmol/L 139 140 143  Potassium 3.5 - 5.1 mmol/L 3.5 4.1 3.8  Chloride 98 - 109 mmol/L 104 107 107  CO2 22 - 29 mmol/L 26 26 25   Calcium 8.4 - 10.4 mg/dL 9.0 9.4 9.5  Total Protein 6.4 - 8.3 g/dL 7.0 6.9 6.8  Total Bilirubin 0.2 - 1.2 mg/dL 0.3 0.2 0.3  Alkaline Phos 40 - 150 U/L 80 80 80  AST 5 - 34 U/L 24 32 26  ALT 0 - 55 U/L 44 63(H) 51     PATHOLOGY  Diagnosis 12/29/16 1. Breast, left, needle core biopsy, stereotactic, upper inner quadrant - FIBROCYSTIC CHANGES INCLUDING APOCRINE METAPLASIA WITH CALCIFICATIONS - NO MALIGNANCY IDENTIFIED 2. Breast, left, needle core biopsy, ultrasound, 1:30 o'clock - INVASIVE MAMMARY CARCINOMA - SEE COMMENT 3. Lymph node, needle/core biopsy, ultrasound, left axillary - METASTIC CARCINOMA INVOLVING ONE LYMPH NODE (1/1) Microscopic Comment 2. The biopsy material shows an infiltrative proliferation of cells with large vesicular nuclei with conspicuous nucleoli, arranged linearly and in small clusters. There are scattered cells with marked pleomorphism and cytologic atypia. Based on the biopsy, the carcinoma appears Nottingham grade 3 of 3 and measures 0.9 cm in greatest linear extent. E-cadherin and prognostic markers (ER/PR/ki-67/HER2-FISH)are pending and will be reported in an addendum. Dr. Tresa Moore  reviewed the case and agrees with the above diagnosis. This case was called to [facility] on December 30, 2016. Results: IMMUNOHISTOCHEMICAL AND MORPHOMETRIC ANALYSIS PERFORMED MANUALLY Estrogen Receptor: 15%, POSITIVE, WEAK STAINING INTENSITY Progesterone Receptor: 0%, NEGATIVE Proliferation Marker Ki67: 10% COMMENT: The negative hormone receptor study(ies) in this case has An internal positive control. Results: HER2 - NEGATIVE RATIO OF HER2/CEP17 SIGNALS 1.63 AVERAGE HER2 COPY NUMBER PER CELL 3.10  Diagnosis 07/16/17 1. Breast, simple mastectomy, Right Prophylactic - FIBROCYSTIC CHANGES INCLUDING APOCRINE METAPLASIA - DUCT ECTASIA - CALCIFICATIONS - NO MALIGNANCY IDENTIFIED 2. Lymph node, sentinel, biopsy, Left Axillary - NO CARCINOMA IDENTIFIED IN ONE LYMPH NODE (0/1) - SEE COMMENT 3. Lymph node, sentinel, biopsy, Left - NO CARCINOMA IDENTIFIED IN ONE LYMPH NODE (0/1) - SEE COMMENT 4. Breast, simple mastectomy, Left - INVASIVE DUCTAL CARCINOMA, NOTTINGHAM GRADE 2 OF 3, 1.3 CM - MARGINS UNINVOLVED BY CARCINOMA (2 CM POSTERIOR MARGIN) - PREVIOUS BIOPSY SITE CHANGES - CALCIFICATIONS - SEE ONCOLOGY TABLE BELOW Microscopic Comment 2. and 3. Cytokeratin AE1/3 was performed on the sentinel lymph nodes to exclude micrometastasis. There is no evidence of metastatic carcinoma by immunohistochemistry. 4. BREAST, STATUS POST NEOADJUVANT TREATMENT Procedure: Total mastectomy Laterality: Left Tumor Size: 1.3 cm (glass slide measurement) Histologic Type: Invasive carcinoma of no special type (ductal, not otherwise specified) Grade: Nottingham Grade 2 Tubular Differentiation: 3 Nuclear Pleomorphism: 3 Mitotic Count: 1 Ductal Carcinoma in Situ (DCIS): Not identified Regional Lymph Nodes: Number of Lymph Nodes Examined: 2 Number of Sentinel Lymph Nodes Examined: 2 Lymph Nodes with Macrometastases: 0 Lymph Nodes with Micrometastases: 0 2 of 5 FINAL for ROLAND, PRINE ([id number]) Microscopic Comment(continued) Lymph Nodes with Isolated Tumor Cells: 0 Margins: Uninvolved by invasive carcinoma Invasive carcinoma, distance from closest margin:  2 cm (posterior/deep) DCIS, distance from closest margin: N/A Breast Prognostic Profile (pre-neoadjuvant case #: [id number]) Estrogen Receptor: Positive (15%, weak) Progesterone Receptor: Negative Her2: Negative (Ratio 1.63) Ki-67: 10% Will be repeated on the current case (Block #: [id number]) and the results reported separately. Residual Cancer Burden (RCB): Primary Tumor Bed: 13 mm x 10 mm Overall Cancer Cellularity: 20% Percentage of Cancer that is in Situ: N/A Number of Positive Lymph Nodes: 0 Diameter of Largest Lymph Node metastasis: N/A Residual Cancer Burden : 1.611 Residual Cancer Burden Class: RCB-II Pathologic Stage Classification (p TNM, AJCC 8th Edition): Primary Tumor: ypT1c Regional Lymph Nodes: ypN0 COMMENT: E-cadherin immunohistochemistry was performed on the biopsy material and was positive supporting a ductal origin. Results: IMMUNOHISTOCHEMICAL AND MORPHOMETRIC ANALYSIS PERFORMED MANUALLY Estrogen Receptor: 0%, NEGATIVE Progesterone Receptor: 0%, NEGATIVE Results: HER2 - NEGATIVE RATIO OF HER2/CEP17 SIGNALS 1.73 AVERAGE HER2 COPY NUMBER PER CELL 2.67  PROCEDURES  ECHO 01/13/17 Study Conclusions - Left ventricle: The cavity size was normal. Systolic function was   normal. The estimated ejection fraction was in the range of 60%   to 65%. Wall motion was normal; there were no regional wall   motion abnormalities. Left ventricular diastolic function   parameters were normal. - Mitral valve: There was trivial regurgitation. - Right atrium: The atrium was mildly dilated. - Tricuspid valve: There was trivial regurgitation.  RADIOGRAPHIC STUDIES: I have personally reviewed the radiological images as listed and agreed with the findings in the report. No results found. ASSESSMENT & PLAN:   EATHER CHAIRES is a 42 y.o. premenopausal female with no significant past medical history, presented with a palpable left breast mass.  1. Malignant neoplasm of upper-outer quadrant of left breast in female, ductal carcinoma, cT2N1M0, Stage IIIa, ER weakly +, PR - , HER2 - , Grade 3, ypT1cN0M0, triple negative  -We reviewed her mammogram and initial biopsy results in details with patient and her husband.  -Her tumor is weakly ER positive, PR negative, HER-2 negative, grade 3, she has positive lymph nodes, those are all high risks for recurrence. I recommend neoadjuvant or adjuvant chemotherapy to reduce her risk of recurrence after surgery. -We discussed that the benefit of neoadjuvant chemotherapy, to shrink the tumor and lymph nodes, and potentially she would be a candidate for lumpectomy and may not need lymph node dissection if she has good response. I have discussed this with her surgeon Dr. Donne Hazel, both of [place] recommend her to consider neoadjuvant chemotherapy. She agrees.  -we reviewed her staging scan CT from 01/15/17 in person. A few hypodense lesion were seen in the liver, appear to be benign cysts. CT and bone scan otherwise negative for distant metastasis. 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 This was discussed with patient. -Patient underwent neoadjuvant chemotherapy with dose dense AC every 2 weeks for 4 cycles, and weekly carbo and Taxol for 12 weeks -Pt underwent a bilateral mastectomy with Dr. Donne Hazel on 07/16/17. Her pathology results reveal a 1.3 tumor removed with all clean margins. There were no positive lymph nodes for carcinoma. Her receptors indicate triple negative. -Due to her triple negative disease, residual 1.3cm tumor and its agressiveness nature, I recommend adjuvant chemo Xeloda for 3-4 months. I gave reading material on this and discussed benefits and side effects in details. She is interested, and I plan to start after she completes RT and BSO  -I  encourage her to consider clinical trial for immunotherapy Keytruda in triple negative cancer. I discussed side effects and  benefits. She is not sure if she wants to do it but is willing to talk to [facility] again today.  -her tumor was weakly ER positive on initial biopsy, and became triple negative after neoadjuvant chemotherapy.  I think the benefit of antiestrogen therapy is very small and I do not recommend it.   -Pt has scheduled her BSO for 11/03/17 and breast reconstruction in about 6 months after completing radiation.  She began radiation with Dr. Lisbeth Barrientez on 09/02/17 and will complete today  -I discussed that approximately in June, if the patient does well with her surgery, we will start her on Xeloda x 3 months. -After the patient completes Xeloda, she will be screened for adjuvant Keytruda clinical trial  -Patient to follow up with her plastic surgeon regarding reconstruction surgery in August, 2019.  -Labs from 09/30/2017 reviewed with the patient and her family today and everything is stable.  -Lab and f/u the week of 6/10    2. Genetics: BRCA1 + -Due to her young age of diagnosis I suggested she get genetic testing. 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  -She is agreeable with bilateral oophorectomy and mastectomy. She underwent mastectomy with Dr. Donne Hazel on 07/16/17 and will have  Reconstruction with plastic surgeon Dr. Ashley Mariner. Total hysterectomy and BSO is scheduled with Dr. Denman George for 11/03/17.  -I previously explained when her children get older they need to get tested and start cancer screenings earlier.   3. Financial Support -They are not insured and are attempting to get an orange card.  -They have met our financial support/social worker  4.  Transmantitis  -05/22/17 showed increase in AST at 44 and ALT at 102, likely secondary to chemotherapy -resolved now after she completed chemo, AST slightly elevated again today, will continue monitoring.   PLAN:  -BSO and hysterectomy scheduled for 11/03/2017 -Plan to start Xeloda in mid June after BSO -Lab and f/u the week of 6/10     All questions were answered. The patient knows to call the clinic with any problems, questions or concerns. I spent 15 minutes counseling the patient face to face. The total time spent in the appointment was 20 minutes and more than 50% was on counseling.  This document serves as a record of services personally performed by [name], MD. It was created on her behalf by Steva Colder, a trained medical scribe. The creation of this record is based on the scribe's personal observations and the provider's statements to them.   I have reviewed the above documentation for accuracy and completeness, and I agree with the above.    [name]  10/16/2017

## 2017-10-14 ENCOUNTER — Inpatient Hospital Stay: Payer: Medicaid Other | Attending: Hematology | Admitting: Gynecologic Oncology

## 2017-10-14 ENCOUNTER — Encounter: Payer: Self-pay | Admitting: Gynecologic Oncology

## 2017-10-14 ENCOUNTER — Ambulatory Visit
Admission: RE | Admit: 2017-10-14 | Discharge: 2017-10-14 | Disposition: A | Discharge status: Home / Self Care | Payer: Medicaid Other | Source: Ambulatory Visit | Attending: Radiation Oncology | Admitting: Radiation Oncology

## 2017-10-14 VITALS — BP 123/76 | HR 95 | Temp 98.1°F | Resp 20 | Ht 64.0 in | Wt 202.2 lb

## 2017-10-14 DIAGNOSIS — Z923 Personal history of irradiation: Secondary | ICD-10-CM | POA: Diagnosis not present

## 2017-10-14 DIAGNOSIS — F17210 Nicotine dependence, cigarettes, uncomplicated: Secondary | ICD-10-CM | POA: Insufficient documentation

## 2017-10-14 DIAGNOSIS — C50412 Malignant neoplasm of upper-outer quadrant of left female breast: Secondary | ICD-10-CM | POA: Diagnosis not present

## 2017-10-14 DIAGNOSIS — Z9013 Acquired absence of bilateral breasts and nipples: Secondary | ICD-10-CM

## 2017-10-14 DIAGNOSIS — Z148 Genetic carrier of other disease: Secondary | ICD-10-CM | POA: Insufficient documentation

## 2017-10-14 DIAGNOSIS — Z510 Encounter for antineoplastic radiation therapy: Secondary | ICD-10-CM | POA: Diagnosis not present

## 2017-10-14 DIAGNOSIS — Z170 Estrogen receptor positive status [ER+]: Secondary | ICD-10-CM | POA: Diagnosis not present

## 2017-10-14 NOTE — Patient Instructions (Signed)
Preparing for your Surgery  Plan for surgery with Dr. [name] in the beginning of June. Kathryn John, NP will call you with the surgery date.  Pre-operative Testing -You will receive a phone call from presurgical testing at [facility] to arrange for a pre-operative testing appointment before your surgery.  This appointment normally occurs one to two weeks before your scheduled surgery.   -Bring your insurance card, copy of an advanced directive if applicable, medication list  -At that visit, you will be asked to sign a consent for a possible blood transfusion in case a transfusion becomes necessary during surgery.  The need for a blood transfusion is rare but having consent is a necessary part of your care.     -You should not be taking blood thinners or aspirin at least ten days prior to surgery unless instructed by your surgeon.  Day Before Surgery at [place] will be asked to take in a light diet the day before surgery.  Avoid carbonated beverages.  You will be advised to have nothing to eat or drink after midnight the evening before.    Eat a light diet the day before surgery.  Examples including soups, broths, toast, yogurt, mashed potatoes.  Things to avoid include carbonated beverages  (fizzy beverages), raw fruits and raw vegetables, or beans.   If your bowels are filled with gas, your surgeon will have difficulty visualizing your pelvic organs which increases your surgical risks.  Your role in recovery Your role is to become active as soon as directed by your doctor, while still giving yourself time to heal.  Rest when you feel tired. You will be asked to do the following in order to speed your recovery:  - Cough and breathe deeply. This helps toclear and expand your lungs and can prevent pneumonia. You may be given a spirometer to practice deep breathing. A staff member will show you how to use the spirometer. - Do mild physical activity. Walking or  moving your legs help your circulation and body functions return to normal. A staff member will help you when you try to walk and will provide you with simple exercises. Do not try to get up or walk alone the first time. - Actively manage your pain. Managing your pain lets you move in comfort. We will ask you to rate your pain on a scale of zero to 10. It is your responsibility to tell your doctor or nurse where and how much you hurt so your pain can be treated.  Special Considerations -If you are diabetic, you may be placed on insulin after surgery to have closer control over your blood sugars to promote healing and recovery.  This does not mean that you will be discharged on insulin.  If applicable, your oral antidiabetics will be resumed when you are tolerating a solid diet.  -Your final pathology results from surgery should be available by the Friday after surgery and the results will be relayed to you when available.  -Dr. [name] is the Surgeon that assists your GYN Oncologist with surgery.  The next day after your surgery you will either see your GYN Oncologist or Dr. [name].  Eat a light diet the day before surgery.  Examples including soups, broths, toast, yogurt, mashed potatoes.  Things to avoid include carbonated beverages (fizzy beverages), raw fruits and raw vegetables, or beans.   If your bowels are filled with gas, your surgeon will have difficulty visualizing your pelvic organs which  increases your surgical risks.  Blood Transfusion Information WHAT IS A BLOOD TRANSFUSION? A transfusion is the replacement of blood or some of its parts. Blood is made up of multiple cells which provide different functions.  Red blood cells carry oxygen and are used for blood loss replacement.  White blood cells fight against infection.  Platelets control bleeding.  Plasma helps clot blood.  Other blood products are available for specialized needs, such as hemophilia or  other clotting disorders. BEFORE THE TRANSFUSION  Who gives blood for transfusions?   You may be able to donate blood to be used at a later date on yourself (autologous donation).  Relatives can be asked to donate blood. This is generally not any safer than if you have received blood from a stranger. The same precautions are taken to ensure safety when a relative's blood is donated.  Healthy volunteers who are fully evaluated to make sure their blood is safe. This is blood bank blood. Transfusion therapy is the safest it has ever been in the practice of medicine. Before blood is taken from a donor, a complete history is taken to make sure that person has no history of diseases nor engages in risky social behavior (examples are intravenous drug use or sexual activity with multiple partners). The donor's travel history is screened to minimize risk of transmitting infections, such as malaria. The donated blood is tested for signs of infectious diseases, such as HIV and hepatitis. The blood is then tested to be sure it is compatible with you in order to minimize the chance of a transfusion reaction. If you or a relative donates blood, this is often done in anticipation of surgery and is not appropriate for emergency situations. It takes many days to process the donated blood. RISKS AND COMPLICATIONS Although transfusion therapy is very safe and saves many lives, the main dangers of transfusion include:   Getting an infectious disease.  Developing a transfusion reaction. This is an allergic reaction to something in the blood you were given. Every precaution is taken to prevent this. The decision to have a blood transfusion has been considered carefully by your caregiver before blood is given. Blood is not given unless the benefits outweigh the risks.  Blood Transfusion Information WHAT IS A BLOOD TRANSFUSION? A transfusion is the replacement of blood or some of its parts. Blood is made up of multiple  cells which provide different functions.  Red blood cells carry oxygen and are used for blood loss replacement.  White blood cells fight against infection.  Platelets control bleeding.  Plasma helps clot blood.  Other blood products are available for specialized needs, such as hemophilia or other clotting disorders. BEFORE THE TRANSFUSION  Who gives blood for transfusions?   You may be able to donate blood to be used at a later date on yourself (autologous donation).  Relatives can be asked to donate blood. This is generally not any safer than if you have received blood from a stranger. The same precautions are taken to ensure safety when a relative's blood is donated.  Healthy volunteers who are fully evaluated to make sure their blood is safe. This is blood bank blood. Transfusion therapy is the safest it has ever been in the practice of medicine. Before blood is taken from a donor, a complete history is taken to make sure that person has no history of diseases nor engages in risky social behavior (examples are intravenous drug use or sexual activity with multiple partners). The donor's  travel history is screened to minimize risk of transmitting infections, such as malaria. The donated blood is tested for signs of infectious diseases, such as HIV and hepatitis. The blood is then tested to be sure it is compatible with you in order to minimize the chance of a transfusion reaction. If you or a relative donates blood, this is often done in anticipation of surgery and is not appropriate for emergency situations. It takes many days to process the donated blood. RISKS AND COMPLICATIONS Although transfusion therapy is very safe and saves many lives, the main dangers of transfusion include:   Getting an infectious disease.  Developing a transfusion reaction. This is an allergic reaction to something in the blood you were given. Every precaution is taken to prevent this. The decision to have a  blood transfusion has been considered carefully by your caregiver before blood is given. Blood is not given unless the benefits outweigh the risks.

## 2017-10-14 NOTE — H&P (View-Only) (Signed)
Consult Note: Gyn-Onc  Consult was requested by Dr. Burr Medico for the evaluation of Kathryn Stanley 42 y.o. female  CC:  Chief Complaint  Patient presents with  . Malignant neoplasm of upper-outer quadrant of left breast in    Assessment/Plan:  Kathryn Stanley  is a 43 y.o.  year old with BRCA 1 germline mutation and left breast cancer.  She is completing therapy for her breast cancer including radiation on Friday. She will be a good candidate for risk reducing surgery with robotic assisted total hysterectomy, BSO. I discussed that postoperatively she will experience surgical menopause which will not be able to be treated with hormonal remedies. She understands the risks of surgery including  bleeding, infection, damage to internal organs (such as bladder,ureters, bowels), blood clot, reoperation and rehospitalization. She was informed of expected postop recovery.   Surgery has been scheduled for May 28th.   HPI: Kathryn Stanley is a 42 year old parous woman who is seen in consultation at the request of Dr Burr Medico for BRCA 1 deleterious mutation.  The patient reports that she was diagnosed with left breast cancer in July, 2018. It is weakly ER positive. She underwent bilateral mastectomies (after a BRCA 1 germline mutation was detected, in addition to adjuvant chemotherapy. She will commence left chest wall radiation in February, 2019.  The patient has no family history significant for breast/ovarian cancer. She has been pregnant once with twins and had a cesarean section. She has had no other abdominal surgeries. She has completed childbearing. Her reconstruction will be with expanders and implants.   Interval Hx: She has been receiving chest wall radiation, due to be completed May 10th.  TVUS on 08/03/17 showed a normal uterus measuring 8.3cm x 4 x 5.3cm. Her ovaries bilaterally were normal. CA 125 was normal.   Current Meds:  Outpatient Encounter Medications as of 10/14/2017   Medication Sig  . famotidine (PEPCID) 10 MG tablet Take 10 mg by mouth daily as needed for heartburn or indigestion.  Marland Kitchen ibuprofen (ADVIL,MOTRIN) 200 MG tablet Take 200-400 mg by mouth daily as needed for headache or moderate pain.  . [DISCONTINUED] nitrofurantoin, macrocrystal-monohydrate, (MACROBID) 100 MG capsule Take 1 capsule (100 mg total) by mouth 2 (two) times daily. (Patient not taking: Reported on 10/14/2017)   No facility-administered encounter medications on file as of 10/14/2017.     Allergy:  Allergies  Allergen Reactions  . Amoxicillin Anaphylaxis and Rash    PATIENT HAS HAD A PCN REACTION WITH IMMEDIATE RASH, FACIAL/TONGUE/THROAT SWELLING, SOB, OR LIGHTHEADEDNESS WITH HYPOTENSION:  #  #  #  YES  #  #  #   Has patient had a PCN reaction causing severe rash involving mucus membranes or skin necrosis: No Has patient had a PCN reaction that required hospitalization: No Has patient had a PCN reaction occurring within the last 10 years: No If all of the above answers are "NO", then may proceed with Cephalosporin use.   . Chlorhexidine Gluconate Rash  . Nicotine Other (See Comments)    Patch caused soreness at application site    Social Hx:   Social History   Socioeconomic History  . Marital status: Married    Spouse name: Not on file  . Number of children: Not on file  . Years of education: Not on file  . Highest education level: Not on file  Occupational History  . Not on file  Social Needs  . Financial resource strain: Not on file  . Food  insecurity:    Worry: Not on file    Inability: Not on file  . Transportation needs:    Medical: Not on file    Non-medical: Not on file  Tobacco Use  . Smoking status: Former Smoker    Packs/day: 1.00    Years: 17.00    Pack years: 17.00    Types: Cigarettes    Last attempt to quit: 12/15/2016    Years since quitting: 0.8  . Smokeless tobacco: Never Used  Substance and Sexual Activity  . Alcohol use: No  . Drug use: No   . Sexual activity: Yes    Birth control/protection: None  Lifestyle  . Physical activity:    Days per week: Not on file    Minutes per session: Not on file  . Stress: Not on file  Relationships  . Social connections:    Talks on phone: Not on file    Gets together: Not on file    Attends religious service: Not on file    Active member of club or organization: Not on file    Attends meetings of clubs or organizations: Not on file    Relationship status: Not on file  . Intimate partner violence:    Fear of current or ex partner: Not on file    Emotionally abused: Not on file    Physically abused: Not on file    Forced sexual activity: Not on file  Other Topics Concern  . Not on file  Social History Narrative  . Not on file    Past Surgical Hx:  Past Surgical History:  Procedure Laterality Date  . BREAST BIOPSY Left 12/2016  . BREAST RECONSTRUCTION Bilateral 07/16/2017   BILATERAL BREAST RECONSTRUCTION WITH PLACEMENT OF TISSUE EXPANDER AND ALLODERM/notes 07/16/2017  . CESAREAN SECTION  2002  . MASTECTOMY Bilateral 07/16/2017   LEFT SKIN SPARING MASTECTOMY WITH LEFT RADIOACTIVE SEED TARGETED AXILLARY LYMPH NODE EXCISION AND AXILLARY SENTINEL LYMPH NODE BIOPSY; RIGHT PROPHYLACTIC SKIN SPARING MASTECTOMY [name] 07/16/2017  . MASTECTOMY WITH RADIOACTIVE SEED GUIDED EXCISION AND AXILLARY SENTINEL LYMPH NODE BIOPSY Left 07/16/2017   Procedure: LEFT SKIN SPARING MASTECTOMY WITH LEFT RADIOACTIVE SEED TARGETED AXILLARY LYMPH NODE EXCISION AND AXILLARY SENTINEL LYMPH NODE BIOPSY;  Surgeon: [name], MD;  Location: [place];  Service: General;  Laterality: Left;  . NIPPLE SPARING MASTECTOMY Right 07/16/2017   Procedure: RIGHT PROPHYLACTIC SKIN SPARING MASTECTOMY;  Surgeon: [name], MD;  Location: [place];  Service: General;  Laterality: Right;  . PORT-A-CATH REMOVAL N/A 07/16/2017   Procedure: REMOVAL PORT-A-CATH;  Surgeon: [name], MD;  Location: [place];  Service:  General;  Laterality: N/A;  . PORTA CATH REMOVAL  07/16/2017  . PORTACATH PLACEMENT N/A 01/14/2017   Procedure: INSERTION PORT-A-CATH WITH [place];  Surgeon: [name], MD;  Location: [place];  Service: General;  Laterality: N/A;    Past Medical Hx:  Past Medical History:  Diagnosis Date  . Breast cancer, left breast ([place]) dx'd 01/2017  . Dermatologic problem    possible psoriasis. Pt has not had been diagnosed by dermatology.  . 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 GERD (gastroesophageal reflux disease)   . Tobacco dependence    trying to quit with nicotene patches    Past Gynecological History:  C/s x 1 No LMP recorded.  Family Hx:  Family History  Problem Relation Age of Onset  . Stroke Maternal Grandmother   .  Stroke Father   . Hypertension Father   . Other Father        Adopted    Review of Systems:  Constitutional  Feels well,    ENT Normal appearing ears and nares bilaterally Skin/Breast  No rash, sores, jaundice, itching, dryness Cardiovascular  No chest pain, shortness of breath, or edema  Pulmonary  No cough or wheeze.  Gastro Intestinal  No nausea, vomitting, or diarrhoea. No bright red blood per rectum, no abdominal pain, change in bowel movement, or constipation.  Genito Urinary  No frequency, urgency, dysuria, no bleeding (amenorrheic since chemotherapy) Musculo Skeletal  No myalgia, arthralgia, joint swelling or pain  Neurologic  No weakness, numbness, change in gait,  Psychology  No depression, anxiety, insomnia.   Vitals:  Blood pressure 123/76, pulse 95, temperature 98.1 F (36.7 C), temperature source Oral, resp. rate 20, height 5' 4"  (1.626 m), weight 202 lb 3.2 oz (91.7 kg), SpO2 100 %.  Physical Exam: WD in NAD Neck  Supple NROM, without any enlargements.  Lymph Node Survey No cervical supraclavicular or inguinal adenopathy Cardiovascular  Pulse normal rate,  regularity and rhythm. S1 and S2 normal.  Lungs  Clear to auscultation bilateraly, without wheezes/crackles/rhonchi. Good air movement.  Skin  No rash/lesions/breakdown  Psychiatry  Alert and oriented to person, place, and time  Abdomen  Normoactive bowel sounds, abdomen soft, non-tender and overweight without evidence of hernia.  Back No CVA tenderness Genito Urinary  Vulva/vagina: Normal external female genitalia.   No lesions. No discharge or bleeding.  Bladder/urethra:  No lesions or masses, well supported bladder  Vagina: narrow  Cervix: Normal appearing, no lesions.  Uterus:  Slightly bulky, mobile, no parametrial involvement or nodularity. Retroverted.  Adnexa: no palpable masses. Rectal  deferred Extremities  No bilateral cyanosis, clubbing or edema.   Thereasa Solo, MD  10/14/2017, 4:27 PM

## 2017-10-14 NOTE — Progress Notes (Signed)
Consult Note: Gyn-Onc  Consult was requested by Dr. Burr Medico for the evaluation of Kathryn Stanley 42 y.o. female  CC:  Chief Complaint  Patient presents with  . Malignant neoplasm of upper-outer quadrant of left breast in    Assessment/Plan:  Kathryn Stanley  is a 42 y.o.  year old with BRCA 1 germline mutation and left breast cancer.  She is completing therapy for her breast cancer including radiation on Friday. She will be a good candidate for risk reducing surgery with robotic assisted total hysterectomy, BSO. I discussed that postoperatively she will experience surgical menopause which will not be able to be treated with hormonal remedies. She understands the risks of surgery including  bleeding, infection, damage to internal organs (such as bladder,ureters, bowels), blood clot, reoperation and rehospitalization. She was informed of expected postop recovery.   Surgery has been scheduled for May 28th.   HPI: Kathryn Stanley is a 42 year old parous woman who is seen in consultation at the request of Dr Burr Medico for BRCA 1 deleterious mutation.  The patient reports that she was diagnosed with left breast cancer in July, 2018. It is weakly ER positive. She underwent bilateral mastectomies (after a BRCA 1 germline mutation was detected, in addition to adjuvant chemotherapy. She will commence left chest wall radiation in February, 2019.  The patient has no family history significant for breast/ovarian cancer. She has been pregnant once with twins and had a cesarean section. She has had no other abdominal surgeries. She has completed childbearing. Her reconstruction will be with expanders and implants.   Interval Hx: She has been receiving chest wall radiation, due to be completed May 10th.  TVUS on 08/03/17 showed a normal uterus measuring 8.3cm x 4 x 5.3cm. Her ovaries bilaterally were normal. CA 125 was normal.   Current Meds:  Outpatient Encounter Medications as of 10/14/2017   Medication Sig  . famotidine (PEPCID) 10 MG tablet Take 10 mg by mouth daily as needed for heartburn or indigestion.  Marland Kitchen ibuprofen (ADVIL,MOTRIN) 200 MG tablet Take 200-400 mg by mouth daily as needed for headache or moderate pain.  . [DISCONTINUED] nitrofurantoin, macrocrystal-monohydrate, (MACROBID) 100 MG capsule Take 1 capsule (100 mg total) by mouth 2 (two) times daily. (Patient not taking: Reported on 10/14/2017)   No facility-administered encounter medications on file as of 10/14/2017.     Allergy:  Allergies  Allergen Reactions  . Amoxicillin Anaphylaxis and Rash    PATIENT HAS HAD A PCN REACTION WITH IMMEDIATE RASH, FACIAL/TONGUE/THROAT SWELLING, SOB, OR LIGHTHEADEDNESS WITH HYPOTENSION:  #  #  #  YES  #  #  #   Has patient had a PCN reaction causing severe rash involving mucus membranes or skin necrosis: No Has patient had a PCN reaction that required hospitalization: No Has patient had a PCN reaction occurring within the last 10 years: No If all of the above answers are "NO", then may proceed with Cephalosporin use.   . Chlorhexidine Gluconate Rash  . Nicotine Other (See Comments)    Patch caused soreness at application site    Social Hx:   Social History   Socioeconomic History  . Marital status: Married    Spouse name: Not on file  . Number of children: Not on file  . Years of education: Not on file  . Highest education level: Not on file  Occupational History  . Not on file  Social Needs  . Financial resource strain: Not on file  . Food  insecurity:    Worry: Not on file    Inability: Not on file  . Transportation needs:    Medical: Not on file    Non-medical: Not on file  Tobacco Use  . Smoking status: Former Smoker    Packs/day: 1.00    Years: 17.00    Pack years: 17.00    Types: Cigarettes    Last attempt to quit: 12/15/2016    Years since quitting: 0.8  . Smokeless tobacco: Never Used  Substance and Sexual Activity  . Alcohol use: No  . Drug use: No   . Sexual activity: Yes    Birth control/protection: None  Lifestyle  . Physical activity:    Days per week: Not on file    Minutes per session: Not on file  . Stress: Not on file  Relationships  . Social connections:    Talks on phone: Not on file    Gets together: Not on file    Attends religious service: Not on file    Active member of club or organization: Not on file    Attends meetings of clubs or organizations: Not on file    Relationship status: Not on file  . Intimate partner violence:    Fear of current or ex partner: Not on file    Emotionally abused: Not on file    Physically abused: Not on file    Forced sexual activity: Not on file  Other Topics Concern  . Not on file  Social History Narrative  . Not on file    Past Surgical Hx:  Past Surgical History:  Procedure Laterality Date  . BREAST BIOPSY Left 12/2016  . BREAST RECONSTRUCTION Bilateral 07/16/2017   BILATERAL BREAST RECONSTRUCTION WITH PLACEMENT OF TISSUE EXPANDER AND ALLODERM/notes 07/16/2017  . CESAREAN SECTION  2002  . MASTECTOMY Bilateral 07/16/2017   LEFT SKIN SPARING MASTECTOMY WITH LEFT RADIOACTIVE SEED TARGETED AXILLARY LYMPH NODE EXCISION AND AXILLARY SENTINEL LYMPH NODE BIOPSY; RIGHT PROPHYLACTIC SKIN SPARING MASTECTOMY [name] 07/16/2017  . MASTECTOMY WITH RADIOACTIVE SEED GUIDED EXCISION AND AXILLARY SENTINEL LYMPH NODE BIOPSY Left 07/16/2017   Procedure: LEFT SKIN SPARING MASTECTOMY WITH LEFT RADIOACTIVE SEED TARGETED AXILLARY LYMPH NODE EXCISION AND AXILLARY SENTINEL LYMPH NODE BIOPSY;  Surgeon: [name], MD;  Location: [facility];  Service: General;  Laterality: Left;  . NIPPLE SPARING MASTECTOMY Right 07/16/2017   Procedure: RIGHT PROPHYLACTIC SKIN SPARING MASTECTOMY;  Surgeon: [name], MD;  Location: [facility];  Service: General;  Laterality: Right;  . PORT-A-CATH REMOVAL N/A 07/16/2017   Procedure: REMOVAL PORT-A-CATH;  Surgeon: [name], MD;  Location: [facility];  Service:  General;  Laterality: N/A;  . PORTA CATH REMOVAL  07/16/2017  . PORTACATH PLACEMENT N/A 01/14/2017   Procedure: INSERTION PORT-A-CATH WITH [place];  Surgeon: [name], MD;  Location: [facility];  Service: General;  Laterality: N/A;    Past Medical Hx:  Past Medical History:  Diagnosis Date  . Breast cancer, left breast ([place]) dx'd 01/2017  . Dermatologic problem    possible psoriasis. Pt has not had been diagnosed by dermatology.  . 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 GERD (gastroesophageal reflux disease)   . Tobacco dependence    trying to quit with nicotene patches    Past Gynecological History:  C/s x 1 No LMP recorded.  Family Hx:  Family History  Problem Relation Age of Onset  . Stroke Maternal Grandmother   .  Stroke Father   . Hypertension Father   . Other Father        Adopted    Review of Systems:  Constitutional  Feels well,    ENT Normal appearing ears and nares bilaterally Skin/Breast  No rash, sores, jaundice, itching, dryness Cardiovascular  No chest pain, shortness of breath, or edema  Pulmonary  No cough or wheeze.  Gastro Intestinal  No nausea, vomitting, or diarrhoea. No bright red blood per rectum, no abdominal pain, change in bowel movement, or constipation.  Genito Urinary  No frequency, urgency, dysuria, no bleeding (amenorrheic since chemotherapy) Musculo Skeletal  No myalgia, arthralgia, joint swelling or pain  Neurologic  No weakness, numbness, change in gait,  Psychology  No depression, anxiety, insomnia.   Vitals:  Blood pressure 123/76, pulse 95, temperature 98.1 F (36.7 C), temperature source Oral, resp. rate 20, height 5' 4"  (1.626 m), weight 202 lb 3.2 oz (91.7 kg), SpO2 100 %.  Physical Exam: WD in NAD Neck  Supple NROM, without any enlargements.  Lymph Node Survey No cervical supraclavicular or inguinal adenopathy Cardiovascular  Pulse normal rate,  regularity and rhythm. S1 and S2 normal.  Lungs  Clear to auscultation bilateraly, without wheezes/crackles/rhonchi. Good air movement.  Skin  No rash/lesions/breakdown  Psychiatry  Alert and oriented to person, place, and time  Abdomen  Normoactive bowel sounds, abdomen soft, non-tender and overweight without evidence of hernia.  Back No CVA tenderness Genito Urinary  Vulva/vagina: Normal external female genitalia.   No lesions. No discharge or bleeding.  Bladder/urethra:  No lesions or masses, well supported bladder  Vagina: narrow  Cervix: Normal appearing, no lesions.  Uterus:  Slightly bulky, mobile, no parametrial involvement or nodularity. Retroverted.  Adnexa: no palpable masses. Rectal  deferred Extremities  No bilateral cyanosis, clubbing or edema.   Thereasa Solo, MD  10/14/2017, 4:27 PM

## 2017-10-15 ENCOUNTER — Ambulatory Visit
Admission: RE | Admit: 2017-10-15 | Discharge: 2017-10-15 | Disposition: A | Discharge status: Home / Self Care | Payer: Medicaid Other | Source: Ambulatory Visit | Attending: Radiation Oncology | Admitting: Radiation Oncology

## 2017-10-15 DIAGNOSIS — Z510 Encounter for antineoplastic radiation therapy: Secondary | ICD-10-CM | POA: Diagnosis not present

## 2017-10-16 ENCOUNTER — Ambulatory Visit
Admission: RE | Admit: 2017-10-16 | Discharge: 2017-10-16 | Disposition: A | Discharge status: Home / Self Care | Payer: Medicaid Other | Source: Ambulatory Visit | Attending: Radiation Oncology | Admitting: Radiation Oncology

## 2017-10-16 ENCOUNTER — Telehealth: Payer: Self-pay | Admitting: Hematology

## 2017-10-16 ENCOUNTER — Encounter: Payer: Self-pay | Admitting: Hematology

## 2017-10-16 ENCOUNTER — Inpatient Hospital Stay (HOSPITAL_BASED_OUTPATIENT_CLINIC_OR_DEPARTMENT_OTHER): Payer: Medicaid Other | Admitting: Hematology

## 2017-10-16 VITALS — BP 137/68 | HR 88 | Temp 97.7°F | Resp 18 | Ht 64.0 in | Wt 201.0 lb

## 2017-10-16 DIAGNOSIS — Z170 Estrogen receptor positive status [ER+]: Secondary | ICD-10-CM | POA: Diagnosis not present

## 2017-10-16 DIAGNOSIS — Z1509 Genetic susceptibility to other malignant neoplasm: Secondary | ICD-10-CM

## 2017-10-16 DIAGNOSIS — Z1501 Genetic susceptibility to malignant neoplasm of breast: Secondary | ICD-10-CM

## 2017-10-16 DIAGNOSIS — C50412 Malignant neoplasm of upper-outer quadrant of left female breast: Secondary | ICD-10-CM

## 2017-10-16 DIAGNOSIS — Z148 Genetic carrier of other disease: Secondary | ICD-10-CM | POA: Diagnosis not present

## 2017-10-16 DIAGNOSIS — Z923 Personal history of irradiation: Secondary | ICD-10-CM

## 2017-10-16 DIAGNOSIS — Z9013 Acquired absence of bilateral breasts and nipples: Secondary | ICD-10-CM

## 2017-10-16 DIAGNOSIS — F17210 Nicotine dependence, cigarettes, uncomplicated: Secondary | ICD-10-CM

## 2017-10-16 DIAGNOSIS — Z510 Encounter for antineoplastic radiation therapy: Secondary | ICD-10-CM | POA: Diagnosis not present

## 2017-10-16 MED ORDER — SILVER SULFADIAZINE 1 % EX CREA
TOPICAL_CREAM | Freq: Once | CUTANEOUS | Status: AC
  Administered 2017-10-16: 16:00:00 via TOPICAL

## 2017-10-16 NOTE — Telephone Encounter (Signed)
Appt scheduled Calendar / letter mailed to patient per 5/10 los

## 2017-10-21 ENCOUNTER — Ambulatory Visit: Payer: Medicaid Other | Admitting: Gynecologic Oncology

## 2017-10-29 NOTE — Patient Instructions (Addendum)
Kathryn Stanley  10/29/2017   Your procedure is scheduled on: 11-03-17   Report to [facility] Main  Entrance    Report to Admitting at 5:30 AM    Call this number if you have problems the morning of surgery [phone]   Remember: NO SOLID FOOD AFTER MIDNIGHT THE NIGHT PRIOR TO SURGERY. NOTHING BY MOUTH EXCEPT CLEAR LIQUIDS UNTIL 3 HOURS PRIOR TO [place] SURGERY. PLEASE FINISH ENSURE DRINK PER SURGEON ORDER 3 HOURS PRIOR TO SCHEDULED SURGERY TIME WHICH NEEDS TO BE COMPLETED AT 4:30AM     CLEAR LIQUID DIET   Foods Allowed                                                                     Foods Excluded  Coffee and tea, regular and decaf                             liquids that you cannot  Plain Jell-O in any flavor                                             see through such as: Fruit ices (not with fruit pulp)                                     milk, soups, orange juice  Iced Popsicles                                    All solid food Carbonated beverages, regular and diet                                    Cranberry, grape and apple juices Sports drinks like Gatorade Lightly seasoned clear broth or consume(fat free) Sugar, honey syrup  Sample Menu Breakfast                                Lunch                                     Supper Cranberry juice                    Beef broth                            Chicken broth Jell-O                                     Grape juice  Apple juice Coffee or tea                        Jell-O                                      Popsicle                                                Coffee or tea                        Coffee or tea  _____________________________________________________________________    Take these medicines the morning of surgery with A SIP OF WATER: None                                You may not have any metal on your body including hair pins and   piercings  Do not wear jewelry, make-up, lotions, powders or perfumes, deodorant             Do not wear nail polish.  Do not shave  48 hours prior to surgery.              Do not bring valuables to the hospital. [place].  Contacts, dentures or bridgework may not be worn into surgery.  Leave suitcase in the car. After surgery it may be brought to your room.     Special Instructions: Cough and Deep Breathing  Exercise             Please read over the following fact sheets you were given: _____________________________________________________________________         [facility] - Preparing for Surgery Before surgery, you can play an important role.  Because skin is not sterile, your skin needs to be as free of germs as possible.  You can reduce the number of germs on your skin by washing with CHG (chlorahexidine gluconate) soap before surgery.  CHG is an antiseptic cleaner which kills germs and bonds with the skin to continue killing germs even after washing. Please DO NOT use if you have an allergy to CHG or antibacterial soaps.  If your skin becomes reddened/irritated stop using the CHG and inform your nurse when you arrive at Short Stay. Do not shave (including legs and underarms) for at least 48 hours prior to the first CHG shower.  You may shave your face/neck. Please follow these instructions carefully:  1.  Shower with CHG Soap the night before surgery and the  morning of Surgery.  2.  If you choose to wash your hair, wash your hair first as usual with your  normal  shampoo.  3.  After you shampoo, rinse your hair and body thoroughly to remove the  shampoo.                           4.  Use CHG as you would any other liquid soap.  You can apply chg directly  to the skin and wash  Gently with a scrungie or clean washcloth.  5.  Apply the CHG Soap to your body ONLY FROM THE NECK DOWN.   Do not use on face/ open                            Wound or open sores. Avoid contact with eyes, ears mouth and genitals (private parts).                       Wash face,  Genitals (private parts) with your normal soap.             6.  Wash thoroughly, paying special attention to the area where your surgery  will be performed.  7.  Thoroughly rinse your body with warm water from the neck down.  8.  DO NOT shower/wash with your normal soap after using and rinsing off  the CHG Soap.                9.  Pat yourself dry with a clean towel.            10.  Wear clean pajamas.            11.  Place clean sheets on your bed the night of your first shower and do not  sleep with pets. Day of Surgery : Do not apply any lotions/deodorants the morning of surgery.  Please wear clean clothes to the hospital/surgery center.  FAILURE TO FOLLOW THESE INSTRUCTIONS MAY RESULT IN THE CANCELLATION OF YOUR SURGERY PATIENT SIGNATURE_________________________________  NURSE SIGNATURE__________________________________  ________________________________________________________________________   Kathryn Stanley  An incentive spirometer is a tool that can help keep your lungs clear and active. This tool measures how well you are filling your lungs with each breath. Taking long deep breaths may help reverse or decrease the chance of developing breathing (pulmonary) problems (especially infection) following:  A long period of time when you are unable to move or be active. BEFORE THE PROCEDURE   If the spirometer includes an indicator to show your best effort, your nurse or respiratory therapist will set it to a desired goal.  If possible, sit up straight or lean slightly forward. Try not to slouch.  Hold the incentive spirometer in an upright position. INSTRUCTIONS FOR USE  1. Sit on the edge of your bed if possible, or sit up as far as you can in bed or on a chair. 2. Hold the incentive spirometer in an upright position. 3. Breathe out  normally. 4. Place the mouthpiece in your mouth and seal your lips tightly around it. 5. Breathe in slowly and as deeply as possible, raising the piston or the ball toward the top of the column. 6. Hold your breath for 3-5 seconds or for as long as possible. Allow the piston or ball to fall to the bottom of the column. 7. Remove the mouthpiece from your mouth and breathe out normally. 8. Rest for a few seconds and repeat Steps 1 through 7 at least 10 times every 1-2 hours when you are awake. Take your time and take a few normal breaths between deep breaths. 9. The spirometer may include an indicator to show your best effort. Use the indicator as a goal to work toward during each repetition. 10. After each set of 10 deep breaths, practice coughing to be sure your lungs are clear. If you have an incision (the cut made at the time of  surgery), support your incision when coughing by placing a pillow or rolled up towels firmly against it. Once you are able to get out of bed, walk around indoors and cough well. You may stop using the incentive spirometer when instructed by your caregiver.  RISKS AND COMPLICATIONS  Take your time so you do not get dizzy or light-headed.  If you are in pain, you may need to take or ask for pain medication before doing incentive spirometry. It is harder to take a deep breath if you are having pain. AFTER USE  Rest and breathe slowly and easily.  It can be helpful to keep track of a log of your progress. Your caregiver can provide you with a simple table to help with this. If you are using the spirometer at home, follow these instructions: [place] IF:   You are having difficultly using the spirometer.  You have trouble using the spirometer as often as instructed.  Your pain medication is not giving enough relief while using the spirometer.  You develop fever of 100.5 F (38.1 C) or higher. SEEK IMMEDIATE MEDICAL CARE IF:   You cough up bloody sputum  that had not been present before.  You develop fever of 102 F (38.9 C) or greater.  You develop worsening pain at or near the incision site. MAKE SURE YOU:   Understand these instructions.  Will watch your condition.  Will get help right away if you are not doing well or get worse. Document Released: 10/06/2006 Document Revised: 08/18/2011 Document Reviewed: 12/07/2006 ExitCare Patient Information 2014 ExitCare, [place].   ________________________________________________________________________  WHAT IS A BLOOD TRANSFUSION? Blood Transfusion Information  A transfusion is the replacement of blood or some of its parts. Blood is made up of multiple cells which provide different functions.  Red blood cells carry oxygen and are used for blood loss replacement.  White blood cells fight against infection.  Platelets control bleeding.  Plasma helps clot blood.  Other blood products are available for specialized needs, such as hemophilia or other clotting disorders. BEFORE THE TRANSFUSION  Who gives blood for transfusions?   Healthy volunteers who are fully evaluated to make sure their blood is safe. This is blood bank blood. Transfusion therapy is the safest it has ever been in the practice of medicine. Before blood is taken from a donor, a complete history is taken to make sure that person has no history of diseases nor engages in risky social behavior (examples are intravenous drug use or sexual activity with multiple partners). The donor's travel history is screened to minimize risk of transmitting infections, such as malaria. The donated blood is tested for signs of infectious diseases, such as HIV and hepatitis. The blood is then tested to be sure it is compatible with you in order to minimize the chance of a transfusion reaction. If you or a relative donates blood, this is often done in anticipation of surgery and is not appropriate for emergency situations. It takes many days to  process the donated blood. RISKS AND COMPLICATIONS Although transfusion therapy is very safe and saves many lives, the main dangers of transfusion include:   Getting an infectious disease.  Developing a transfusion reaction. This is an allergic reaction to something in the blood you were given. Every precaution is taken to prevent this. The decision to have a blood transfusion has been considered carefully by your caregiver before blood is given. Blood is not given unless the benefits outweigh the risks. AFTER THE  TRANSFUSION  Right after receiving a blood transfusion, you will usually feel much better and more energetic. This is especially true if your red blood cells have gotten low (anemic). The transfusion raises the level of the red blood cells which carry oxygen, and this usually causes an energy increase.  The nurse administering the transfusion will monitor you carefully for complications. HOME CARE INSTRUCTIONS  No special instructions are needed after a transfusion. You may find your energy is better. Speak with your caregiver about any limitations on activity for underlying diseases you may have. SEEK MEDICAL CARE IF:   Your condition is not improving after your transfusion.  You develop redness or irritation at the intravenous (IV) site. SEEK IMMEDIATE MEDICAL CARE IF:  Any of the following symptoms occur over the next 12 hours:  Shaking chills.  You have a temperature by mouth above 102 F (38.9 C), not controlled by medicine.  Chest, back, or muscle pain.  People around you feel you are not acting correctly or are confused.  Shortness of breath or difficulty breathing.  Dizziness and fainting.  You get a rash or develop hives.  You have a decrease in urine output.  Your urine turns a dark color or changes to pink, red, or brown. Any of the following symptoms occur over the next 10 days:  You have a temperature by mouth above 102 F (38.9 C), not controlled by  medicine.  Shortness of breath.  Weakness after normal activity.  The white part of the eye turns yellow (jaundice).  You have a decrease in the amount of urine or are urinating less often.  Your urine turns a dark color or changes to pink, red, or brown. Document Released: 05/23/2000 Document Revised: 08/18/2011 Document Reviewed: 01/10/2008 [facility] Patient Information 2014 [place], [place].  _______________________________________________________________________

## 2017-10-29 NOTE — Progress Notes (Signed)
04-17-17 (Epic) ECHO

## 2017-10-30 ENCOUNTER — Encounter (HOSPITAL_COMMUNITY)
Admission: RE | Admit: 2017-10-30 | Discharge: 2017-10-30 | Disposition: A | Discharge status: Home / Self Care | Payer: Medicaid Other | Source: Ambulatory Visit | Attending: Gynecologic Oncology | Admitting: Gynecologic Oncology

## 2017-10-30 ENCOUNTER — Other Ambulatory Visit: Payer: Self-pay

## 2017-10-30 ENCOUNTER — Encounter (HOSPITAL_COMMUNITY): Payer: Self-pay

## 2017-10-30 DIAGNOSIS — Z01812 Encounter for preprocedural laboratory examination: Secondary | ICD-10-CM | POA: Insufficient documentation

## 2017-10-30 HISTORY — DX: Other specified postprocedural states: Z98.890

## 2017-10-30 HISTORY — DX: Nausea with vomiting, unspecified: R11.2

## 2017-10-30 HISTORY — DX: Other complications of anesthesia, initial encounter: T88.59XA

## 2017-10-30 HISTORY — DX: Adverse effect of unspecified anesthetic, initial encounter: T41.45XA

## 2017-10-30 LAB — CBC
HCT: 37.6 % (ref 36.0–46.0)
Hemoglobin: 12.4 g/dL (ref 12.0–15.0)
MCH: 27.1 pg (ref 26.0–34.0)
MCHC: 33 g/dL (ref 30.0–36.0)
MCV: 82.3 fL (ref 78.0–100.0)
Platelets: 181 10*3/uL (ref 150–400)
RBC: 4.57 MIL/uL (ref 3.87–5.11)
RDW: 14.8 % (ref 11.5–15.5)
WBC: 5 10*3/uL (ref 4.0–10.5)

## 2017-10-30 LAB — URINALYSIS, ROUTINE W REFLEX MICROSCOPIC
BILIRUBIN URINE: NEGATIVE
Glucose, UA: NEGATIVE mg/dL
Hgb urine dipstick: NEGATIVE
KETONES UR: NEGATIVE mg/dL
LEUKOCYTES UA: NEGATIVE
NITRITE: NEGATIVE
PROTEIN: NEGATIVE mg/dL
Specific Gravity, Urine: 1.02 (ref 1.005–1.030)
pH: 6 (ref 5.0–8.0)

## 2017-10-30 LAB — COMPREHENSIVE METABOLIC PANEL
ALBUMIN: 3.7 g/dL (ref 3.5–5.0)
ALK PHOS: 79 U/L (ref 38–126)
ALT: 36 U/L (ref 14–54)
ANION GAP: 7 (ref 5–15)
AST: 23 U/L (ref 15–41)
BUN: 16 mg/dL (ref 6–20)
CO2: 23 mmol/L (ref 22–32)
Calcium: 8.3 mg/dL — ABNORMAL LOW (ref 8.9–10.3)
Chloride: 109 mmol/L (ref 101–111)
Creatinine, Ser: 0.62 mg/dL (ref 0.44–1.00)
GFR calc Af Amer: >60 mL/min (ref >60)
GFR calc non Af Amer: >60 mL/min (ref >60)
GLUCOSE: 108 mg/dL — AB (ref 65–99)
Potassium: 3.6 mmol/L (ref 3.5–5.1)
SODIUM: 139 mmol/L (ref 135–145)
Total Bilirubin: 0.3 mg/dL (ref 0.3–1.2)
Total Protein: 6.8 g/dL (ref 6.5–8.1)

## 2017-10-30 LAB — PREGNANCY, URINE: Preg Test, Ur: NEGATIVE

## 2017-10-31 LAB — ABO/RH: ABO/RH(D): A POS

## 2017-11-03 ENCOUNTER — Ambulatory Visit (HOSPITAL_COMMUNITY): Payer: Medicaid Other | Admitting: Certified Registered"

## 2017-11-03 ENCOUNTER — Ambulatory Visit (HOSPITAL_COMMUNITY)
Admission: RE | Admit: 2017-11-03 | Discharge: 2017-11-03 | Disposition: A | Discharge status: Home / Self Care | Payer: Medicaid Other | Source: Ambulatory Visit | Attending: Gynecologic Oncology | Admitting: Gynecologic Oncology

## 2017-11-03 ENCOUNTER — Encounter (HOSPITAL_COMMUNITY)
Admission: RE | Disposition: A | Discharge status: Home / Self Care | Payer: Self-pay | Source: Ambulatory Visit | Attending: Gynecologic Oncology

## 2017-11-03 ENCOUNTER — Encounter (HOSPITAL_COMMUNITY): Payer: Self-pay

## 2017-11-03 DIAGNOSIS — N8302 Follicular cyst of left ovary: Secondary | ICD-10-CM | POA: Insufficient documentation

## 2017-11-03 DIAGNOSIS — N8301 Follicular cyst of right ovary: Secondary | ICD-10-CM | POA: Diagnosis not present

## 2017-11-03 DIAGNOSIS — Z1509 Genetic susceptibility to other malignant neoplasm: Secondary | ICD-10-CM

## 2017-11-03 DIAGNOSIS — Z4002 Encounter for prophylactic removal of ovary: Secondary | ICD-10-CM | POA: Diagnosis not present

## 2017-11-03 DIAGNOSIS — Z9221 Personal history of antineoplastic chemotherapy: Secondary | ICD-10-CM | POA: Insufficient documentation

## 2017-11-03 DIAGNOSIS — Z923 Personal history of irradiation: Secondary | ICD-10-CM | POA: Insufficient documentation

## 2017-11-03 DIAGNOSIS — Z9013 Acquired absence of bilateral breasts and nipples: Secondary | ICD-10-CM | POA: Insufficient documentation

## 2017-11-03 DIAGNOSIS — Z1501 Genetic susceptibility to malignant neoplasm of breast: Secondary | ICD-10-CM

## 2017-11-03 DIAGNOSIS — Z4009 Encounter for prophylactic removal of other organ: Secondary | ICD-10-CM | POA: Insufficient documentation

## 2017-11-03 DIAGNOSIS — Z853 Personal history of malignant neoplasm of breast: Secondary | ICD-10-CM | POA: Insufficient documentation

## 2017-11-03 DIAGNOSIS — Z87891 Personal history of nicotine dependence: Secondary | ICD-10-CM | POA: Insufficient documentation

## 2017-11-03 DIAGNOSIS — K219 Gastro-esophageal reflux disease without esophagitis: Secondary | ICD-10-CM | POA: Diagnosis not present

## 2017-11-03 HISTORY — PX: ROBOTIC ASSISTED TOTAL HYSTERECTOMY WITH BILATERAL SALPINGO OOPHERECTOMY: SHX6086

## 2017-11-03 LAB — TYPE AND SCREEN
ABO/RH(D): A POS
ANTIBODY SCREEN: NEGATIVE

## 2017-11-03 SURGERY — HYSTERECTOMY, TOTAL, ROBOT-ASSISTED, LAPAROSCOPIC, WITH BILATERAL SALPINGO-OOPHORECTOMY
Anesthesia: General | Laterality: Bilateral

## 2017-11-03 MED ORDER — ENSURE PRE-SURGERY PO LIQD: 592.0000 mL | Freq: Once | ORAL | Status: DC

## 2017-11-03 MED ORDER — BUPIVACAINE HCL (PF) 0.25 % IJ SOLN
INTRAMUSCULAR | Status: AC
  Filled 2017-11-03: qty 30

## 2017-11-03 MED ORDER — STERILE WATER FOR IRRIGATION IR SOLN
Status: DC | PRN
  Administered 2017-11-03: 1000 mL

## 2017-11-03 MED ORDER — OXYCODONE HCL 5 MG PO TABS
ORAL_TABLET | ORAL | Status: AC
  Filled 2017-11-03: qty 1

## 2017-11-03 MED ORDER — MEPERIDINE HCL 50 MG/ML IJ SOLN: 6.2500 mg | INTRAMUSCULAR | Status: DC | PRN

## 2017-11-03 MED ORDER — DEXAMETHASONE SODIUM PHOSPHATE 10 MG/ML IJ SOLN
INTRAMUSCULAR | Status: DC | PRN
  Administered 2017-11-03: 10 mg via INTRAVENOUS

## 2017-11-03 MED ORDER — KETOROLAC TROMETHAMINE 15 MG/ML IJ SOLN: 15.0000 mg | Freq: Four times a day (QID) | INTRAMUSCULAR | Status: DC

## 2017-11-03 MED ORDER — LIDOCAINE 2% (20 MG/ML) 5 ML SYRINGE
INTRAMUSCULAR | Status: DC | PRN
  Administered 2017-11-03: 60 mg via INTRAVENOUS

## 2017-11-03 MED ORDER — ROCURONIUM BROMIDE 10 MG/ML (PF) SYRINGE
PREFILLED_SYRINGE | INTRAVENOUS | Status: DC | PRN
  Administered 2017-11-03: 10 mg via INTRAVENOUS
  Administered 2017-11-03: 50 mg via INTRAVENOUS

## 2017-11-03 MED ORDER — LACTATED RINGERS IV SOLN: INTRAVENOUS | Status: DC

## 2017-11-03 MED ORDER — HYDROMORPHONE HCL 1 MG/ML IJ SOLN: 0.2500 mg | INTRAMUSCULAR | Status: DC | PRN

## 2017-11-03 MED ORDER — FENTANYL CITRATE (PF) 100 MCG/2ML IJ SOLN
INTRAMUSCULAR | Status: AC
  Filled 2017-11-03: qty 2

## 2017-11-03 MED ORDER — CELECOXIB 200 MG PO CAPS
400.0000 mg | ORAL_CAPSULE | ORAL | Status: AC
  Administered 2017-11-03: 400 mg via ORAL
  Filled 2017-11-03: qty 2

## 2017-11-03 MED ORDER — FENTANYL CITRATE (PF) 100 MCG/2ML IJ SOLN
INTRAMUSCULAR | Status: DC | PRN
  Administered 2017-11-03 (×2): 50 ug via INTRAVENOUS

## 2017-11-03 MED ORDER — CIPROFLOXACIN IN D5W 400 MG/200ML IV SOLN
INTRAVENOUS | Status: AC
  Filled 2017-11-03: qty 200

## 2017-11-03 MED ORDER — ACETAMINOPHEN 500 MG PO TABS
1000.0000 mg | ORAL_TABLET | ORAL | Status: AC
  Administered 2017-11-03: 1000 mg via ORAL
  Filled 2017-11-03: qty 2

## 2017-11-03 MED ORDER — DEXAMETHASONE SODIUM PHOSPHATE 4 MG/ML IJ SOLN: 4.0000 mg | INTRAMUSCULAR | Status: DC

## 2017-11-03 MED ORDER — SCOPOLAMINE 1 MG/3DAYS TD PT72
1.0000 | MEDICATED_PATCH | TRANSDERMAL | Status: DC
  Administered 2017-11-03: 1.5 mg via TRANSDERMAL
  Filled 2017-11-03: qty 1

## 2017-11-03 MED ORDER — OXYCODONE HCL 5 MG PO TABS
5.0000 mg | ORAL_TABLET | Freq: Once | ORAL | Status: AC
  Administered 2017-11-03: 5 mg via ORAL

## 2017-11-03 MED ORDER — GABAPENTIN 300 MG PO CAPS
300.0000 mg | ORAL_CAPSULE | ORAL | Status: AC
  Administered 2017-11-03: 300 mg via ORAL
  Filled 2017-11-03: qty 1

## 2017-11-03 MED ORDER — FENTANYL CITRATE (PF) 250 MCG/5ML IJ SOLN
INTRAMUSCULAR | Status: DC | PRN
  Administered 2017-11-03 (×2): 100 ug via INTRAVENOUS
  Administered 2017-11-03: 50 ug via INTRAVENOUS

## 2017-11-03 MED ORDER — OXYCODONE-ACETAMINOPHEN 5-325 MG PO TABS
1.0000 | ORAL_TABLET | ORAL | 0 refills | Status: DC | PRN
Start: 2017-11-03 — End: 2017-11-16

## 2017-11-03 MED ORDER — BUPIVACAINE HCL (PF) 0.25 % IJ SOLN
INTRAMUSCULAR | Status: DC | PRN
  Administered 2017-11-03: 30 mL

## 2017-11-03 MED ORDER — CLINDAMYCIN PHOSPHATE 900 MG/50ML IV SOLN
900.0000 mg | INTRAVENOUS | Status: AC
  Administered 2017-11-03: 900 mg via INTRAVENOUS

## 2017-11-03 MED ORDER — PROPOFOL 10 MG/ML IV BOLUS
INTRAVENOUS | Status: DC | PRN
  Administered 2017-11-03: 140 mg via INTRAVENOUS

## 2017-11-03 MED ORDER — MIDAZOLAM HCL 2 MG/2ML IJ SOLN
INTRAMUSCULAR | Status: AC
  Filled 2017-11-03: qty 2

## 2017-11-03 MED ORDER — CIPROFLOXACIN IN D5W 400 MG/200ML IV SOLN
400.0000 mg | INTRAVENOUS | Status: AC
  Administered 2017-11-03: 400 mg via INTRAVENOUS

## 2017-11-03 MED ORDER — ONDANSETRON HCL 4 MG/2ML IJ SOLN
INTRAMUSCULAR | Status: DC | PRN
  Administered 2017-11-03: 4 mg via INTRAVENOUS

## 2017-11-03 MED ORDER — CLINDAMYCIN PHOSPHATE 900 MG/50ML IV SOLN
INTRAVENOUS | Status: AC
  Filled 2017-11-03: qty 50

## 2017-11-03 MED ORDER — PROMETHAZINE HCL 25 MG/ML IJ SOLN
6.2500 mg | INTRAMUSCULAR | Status: DC | PRN
  Administered 2017-11-03: 6.25 mg via INTRAVENOUS

## 2017-11-03 MED ORDER — PROPOFOL 10 MG/ML IV BOLUS
INTRAVENOUS | Status: AC
  Filled 2017-11-03: qty 20

## 2017-11-03 MED ORDER — SUGAMMADEX SODIUM 200 MG/2ML IV SOLN
INTRAVENOUS | Status: DC | PRN
  Administered 2017-11-03: 200 mg via INTRAVENOUS

## 2017-11-03 MED ORDER — LACTATED RINGERS IR SOLN
Status: DC | PRN
  Administered 2017-11-03: 1000 mL

## 2017-11-03 MED ORDER — FENTANYL CITRATE (PF) 250 MCG/5ML IJ SOLN
INTRAMUSCULAR | Status: AC
  Filled 2017-11-03: qty 5

## 2017-11-03 MED ORDER — LACTATED RINGERS IV SOLN
INTRAVENOUS | Status: DC
  Administered 2017-11-03 (×2): via INTRAVENOUS

## 2017-11-03 MED ORDER — KETOROLAC TROMETHAMINE 15 MG/ML IJ SOLN
INTRAMUSCULAR | Status: AC
  Filled 2017-11-03: qty 1

## 2017-11-03 MED ORDER — MIDAZOLAM HCL 2 MG/2ML IJ SOLN
INTRAMUSCULAR | Status: DC | PRN
  Administered 2017-11-03: 2 mg via INTRAVENOUS

## 2017-11-03 MED ORDER — PROMETHAZINE HCL 25 MG/ML IJ SOLN
INTRAMUSCULAR | Status: AC
  Filled 2017-11-03: qty 1

## 2017-11-03 MED ORDER — SCOPOLAMINE 1 MG/3DAYS TD PT72
MEDICATED_PATCH | TRANSDERMAL | Status: AC
  Filled 2017-11-03: qty 1

## 2017-11-03 MED FILL — OXYCODONE-ACETAMINOPHEN 5-3: 5-325 | 1 days supply | Qty: 20 | Fill #0

## 2017-11-03 SURGICAL SUPPLY — 48 items
ADH SKN CLS APL DERMABOND .7 (GAUZE/BANDAGES/DRESSINGS) ×1
AGENT HMST KT MTR STRL THRMB (HEMOSTASIS)
APL ESCP 34 STRL LF DISP (HEMOSTASIS)
APPLICATOR SURGIFLO ENDO (HEMOSTASIS) IMPLANT
BAG LAPAROSCOPIC 12 15 PORT 16 (BASKET) IMPLANT
BAG RETRIEVAL 12/15 (BASKET)
BAG SPEC RTRVL LRG 6X4 10 (ENDOMECHANICALS)
COVER BACK TABLE 60X90IN (DRAPES) ×2 IMPLANT
COVER TIP SHEARS 8 DVNC (MISCELLANEOUS) ×1 IMPLANT
COVER TIP SHEARS 8MM DA VINCI (MISCELLANEOUS) ×1
DERMABOND ADVANCED (GAUZE/BANDAGES/DRESSINGS) ×1
DERMABOND ADVANCED .7 DNX12 (GAUZE/BANDAGES/DRESSINGS) ×1 IMPLANT
DRAPE ARM DVNC X/XI (DISPOSABLE) ×4 IMPLANT
DRAPE COLUMN DVNC XI (DISPOSABLE) ×1 IMPLANT
DRAPE DA VINCI XI ARM (DISPOSABLE) ×4
DRAPE DA VINCI XI COLUMN (DISPOSABLE) ×1
DRAPE SHEET LG 3/4 BI-LAMINATE (DRAPES) ×2 IMPLANT
DRAPE SURG IRRIG POUCH 19X23 (DRAPES) ×2 IMPLANT
ELECT REM PT RETURN 15FT ADLT (MISCELLANEOUS) ×2 IMPLANT
GLOVE BIO SURGEON STRL SZ 6 (GLOVE) ×8 IMPLANT
GLOVE BIO SURGEON STRL SZ 6.5 (GLOVE) IMPLANT
GOWN STRL REUS W/ TWL LRG LVL3 (GOWN DISPOSABLE) ×2 IMPLANT
GOWN STRL REUS W/TWL LRG LVL3 (GOWN DISPOSABLE) ×4
HOLDER FOLEY CATH W/STRAP (MISCELLANEOUS) ×1 IMPLANT
IRRIG SUCT STRYKERFLOW 2 WTIP (MISCELLANEOUS) ×2
IRRIGATION SUCT STRKRFLW 2 WTP (MISCELLANEOUS) ×1 IMPLANT
KIT PROCEDURE DA VINCI SI (MISCELLANEOUS)
KIT PROCEDURE DVNC SI (MISCELLANEOUS) IMPLANT
MANIPULATOR UTERINE 4.5 ZUMI (MISCELLANEOUS) ×2 IMPLANT
NEEDLE SPNL 18GX3.5 QUINCKE PK (NEEDLE) IMPLANT
OBTURATOR OPTICAL STANDARD 8MM (TROCAR) ×1
OBTURATOR OPTICAL STND 8 DVNC (TROCAR) ×1
OBTURATOR OPTICALSTD 8 DVNC (TROCAR) ×1 IMPLANT
PACK ROBOT GYN CUSTOM WL (TRAY / TRAY PROCEDURE) ×2 IMPLANT
PAD POSITIONING PINK XL (MISCELLANEOUS) ×2 IMPLANT
POUCH SPECIMEN RETRIEVAL 10MM (ENDOMECHANICALS) IMPLANT
SEAL CANN UNIV 5-8 DVNC XI (MISCELLANEOUS) ×4 IMPLANT
SEAL XI 5MM-8MM UNIVERSAL (MISCELLANEOUS) ×4
SET TRI-LUMEN FLTR TB AIRSEAL (TUBING) ×2 IMPLANT
SURGIFLO W/THROMBIN 8M KIT (HEMOSTASIS) IMPLANT
SUT VIC AB 0 CT1 27 (SUTURE)
SUT VIC AB 0 CT1 27XBRD ANTBC (SUTURE) IMPLANT
SYR 10ML LL (SYRINGE) IMPLANT
TOWEL OR NON WOVEN STRL DISP B (DISPOSABLE) ×2 IMPLANT
TRAP SPECIMEN MUCOUS 40CC (MISCELLANEOUS) ×1 IMPLANT
TRAY FOLEY MTR SLVR 16FR STAT (SET/KITS/TRAYS/PACK) ×2 IMPLANT
UNDERPAD 30X30 (UNDERPADS AND DIAPERS) ×2 IMPLANT
WATER STERILE IRR 1000ML POUR (IV SOLUTION) ×1 IMPLANT

## 2017-11-03 NOTE — Interval H&P Note (Signed)
History and Physical Interval Note:  11/03/2017 7:06 AM  [name]  has presented today for surgery, with the diagnosis of BRCA 1 Positive  The various methods of treatment have been discussed with the patient and family. After consideration of risks, benefits and other options for treatment, the patient has consented to  Procedure(s): XI ROBOTIC ASSISTED TOTAL HYSTERECTOMY WITH BILATERAL SALPINGO OOPHORECTOMY (Bilateral) as a surgical intervention .  The patient's history has been reviewed, patient examined, no change in status, stable for surgery.  I have reviewed the patient's chart and labs.  Questions were answered to the patient's satisfaction.     Thereasa Solo

## 2017-11-03 NOTE — Transfer of Care (Signed)
Immediate Anesthesia Transfer of Care Note  Patient: Kathryn Stanley  Procedure(s) Performed: XI ROBOTIC ASSISTED TOTAL HYSTERECTOMY WITH BILATERAL SALPINGO OOPHORECTOMY (Bilateral )  Patient Location: PACU  Anesthesia Type:General  Level of Consciousness: awake and alert   Airway & Oxygen Therapy: Patient Spontanous Breathing and Patient connected to face mask oxygen  Post-op Assessment: Report given to RN and Post -op Vital signs reviewed and stable  Post vital signs: Reviewed and stable  Last Vitals:  Vitals Value Taken Time  BP 136/104 11/03/2017  9:12 AM  Temp    Pulse 92 11/03/2017  9:14 AM  Resp 8 11/03/2017  9:14 AM  SpO2 100 % 11/03/2017  9:14 AM  Vitals shown include unvalidated device data.  Last Pain:  Vitals:   11/03/17 0604  TempSrc:   PainSc: 0-No pain         Complications: No apparent anesthesia complications

## 2017-11-03 NOTE — Anesthesia Procedure Notes (Signed)
Procedure Name: Intubation Date/Time: 11/03/2017 7:44 AM Performed by: Cynda Familia, CRNA Pre-anesthesia Checklist: Patient identified, Emergency Drugs available, Suction available and Patient being monitored Patient Re-evaluated:Patient Re-evaluated prior to induction Oxygen Delivery Method: Circle System Utilized Preoxygenation: Pre-oxygenation with 100% oxygen Induction Type: IV induction Ventilation: Mask ventilation without difficulty Laryngoscope Size: Miller and 2 Grade View: Grade I Tube type: Oral Number of attempts: 1 Airway Equipment and Method: Stylet Placement Confirmation: ETT inserted through vocal cords under direct vision,  positive ETCO2 and breath sounds checked- equal and bilateral Secured at: 22 cm Tube secured with: Tape Dental Injury: Teeth and Oropharynx as per pre-operative assessment  Comments: Smooth IV induction [place]-- intubation AM CRNA atraumatic-- front tooth chip present prior to laryngoscopy--- teeth and mouth unchanged after intubation--- bilat BS [place]

## 2017-11-03 NOTE — Discharge Instructions (Signed)
Robotic Assisted  Hysterectomy Care After Refer to this sheet in the next few weeks. These instructions provide you with information on caring for yourself after your procedure. Your caregiver may also give you more specific instructions. Your treatment has been planned according to current medical practices, but problems sometimes occur. Call your caregiver if you have any problems or questions after your procedure. HOME CARE INSTRUCTIONS Healing will take time. You may have discomfort, tenderness, swelling, and bruising at the surgical site for a couple of weeks. This is normal and will get better as time goes on.  Only take over-the-counter or prescription medicines for pain, discomfort, or fever as directed by your caregiver.   Do not take aspirin. It can cause bleeding.   Do not drive when taking pain medicine.   Follow your caregiver's advice regarding diet, exercise, lifting, driving, and general activities.   Resume your usual diet as directed and allowed.   Get plenty of rest and sleep.   Do not douche, use tampons, or have sexual intercourse for at least 6 weeks, or until your caregiver gives you permission.   Change your bandages (dressings) as directed by your caregiver.   Monitor your temperature and notify your caregiver of a fever.   Take showers instead of baths for 2 to 3 weeks.   Do not drink alcohol until your caregiver gives you permission.   If you develop constipation, you may take a mild laxative with your caregiver's permission. Bran foods may help with constipation problems. Drinking enough fluids to keep your urine clear or pale yellow may help as well.   Try to have someone home with you for 1 or 2 weeks to help around the house.   Keep all your follow-up appointments as directed by your caregiver.  SEEK MEDICIAL CARE IF:   You have swelling, redness, or increasing pain in the wound.   You have pus coming from the wound.   You notice a bad smell  coming from the wound or bandage (dressing).   You have swelling, redness, or pain from the intravenous (IV) site.   Your wound breaks open.   You feel dizzy or lightheaded.   You have pain or bleeding when you urinate.   You have persistent diarrhea.   You have persistent nausea and vomiting.   You have abnormal vaginal discharge.   You have a rash.   You have any type of abnormal reaction or develop an allergy to your medicine.   You have poor pain control with your prescribed medicine.  SEEK IMMEDIATE MEDICIAL CARE IF:   You have a fever.   You have severe abdominal pain.   You have chest pain.   You have shortness of breath.   You faint.   You have pain, swelling, or redness of your leg.   You have heavy vaginal bleeding with blood clots.  MAKE SURE YOU:  Understand these instructions.   Will watch your condition.   Will get help right away if you are not doing well or get worse.  Document Released: 05/15/2011 Document Reviewed: 05/12/2011 [facility] Patient Information 2012 [place].

## 2017-11-03 NOTE — Anesthesia Postprocedure Evaluation (Signed)
Anesthesia Post Note  Patient: Kathryn Stanley  Procedure(s) Performed: XI ROBOTIC ASSISTED TOTAL HYSTERECTOMY WITH BILATERAL SALPINGO OOPHORECTOMY (Bilateral )     Patient location during evaluation: PACU Anesthesia Type: General Level of consciousness: awake and alert Pain management: pain level controlled Vital Signs Assessment: post-procedure vital signs reviewed and stable Respiratory status: spontaneous breathing, nonlabored ventilation, respiratory function stable and patient connected to nasal cannula oxygen Cardiovascular status: blood pressure returned to baseline and stable Postop Assessment: no apparent nausea or vomiting Anesthetic complications: no    Last Vitals:  Vitals:   11/03/17 1000 11/03/17 1015  BP: 113/75 124/86  Pulse:    Resp:  16  Temp:  (!) 36.4 C  SpO2:      Last Pain:  Vitals:   11/03/17 1015  TempSrc:   PainSc: 0-No pain                 Effie Berkshire

## 2017-11-03 NOTE — Anesthesia Procedure Notes (Signed)
Date/Time: 11/03/2017 9:05 AM Performed by: Cynda Familia, CRNA Oxygen Delivery Method: Simple face mask Placement Confirmation: positive ETCO2 and breath sounds checked- equal and bilateral Dental Injury: Teeth and Oropharynx as per pre-operative assessment

## 2017-11-03 NOTE — Anesthesia Preprocedure Evaluation (Addendum)
Anesthesia Evaluation  Patient identified by MRN, date of birth, ID band Patient awake    History of Anesthesia Complications (+) PONV and history of anesthetic complications  Airway Mallampati: I  TM Distance: >3 FB Neck ROM: Full    Dental  (+) Teeth Intact, Dental Advisory Given, Chipped,    Pulmonary former smoker,    breath sounds clear to auscultation       Cardiovascular negative cardio ROS   Rhythm:Regular Rate:Normal     Neuro/Psych negative neurological ROS  negative psych ROS   GI/Hepatic Neg liver ROS, GERD  Medicated,  Endo/Other  negative endocrine ROS  Renal/GU negative Renal ROS     Musculoskeletal negative musculoskeletal ROS (+)   Abdominal (+) + obese,   Peds  Hematology negative hematology ROS (+)   Anesthesia Other Findings   Reproductive/Obstetrics                           Anesthesia Physical Anesthesia Plan  ASA: II  Anesthesia Plan: General   Post-op Pain Management:    Induction: Intravenous  PONV Risk Score and Plan: 4 or greater and Ondansetron, Dexamethasone, Midazolam and Scopolamine patch - Pre-op  Airway Management Planned: Oral ETT  Additional Equipment: None  Intra-op Plan:   Post-operative Plan: Extubation in OR  Informed Consent: I have reviewed the patients History and Physical, chart, labs and discussed the procedure including the risks, benefits and alternatives for the proposed anesthesia with the patient or authorized representative who has indicated his/her understanding and acceptance.   Dental advisory given  Plan Discussed with: CRNA  Anesthesia Plan Comments:         Anesthesia Quick Evaluation

## 2017-11-03 NOTE — Op Note (Signed)
OPERATIVE NOTE 11/03/17  Surgeon: Donaciano Eva   Assistants: Dr [name] (an MD assistant was necessary for tissue manipulation, management of robotic instrumentation, retraction and positioning due to the complexity of the case and hospital policies).   Anesthesia: General endotracheal anesthesia  ASA Class: 2   Pre-operative Diagnosis: BRCA 1 germline mutation  Post-operative Diagnosis: same  Operation: Robotic-assisted laparoscopic total hysterectomy with bilateral salpingoophorectomy  Surgeon: Donaciano Eva  Assistant Surgeon: [name] MD  Anesthesia: GET  Urine Output: 300cc  Operative Findings:  Left ovarian cyst (appeared physiologic - see images in epic). Normal right tube and ovary and uterus. Normal upper abdoment and omentum. Normal diaphragms  Estimated Blood Loss:  <20cc      Total IV Fluids: 600 ml         Specimens: uterus, cervix, bilateral tubes and ovaries, washings         Complications:  None; patient tolerated the procedure well.         Disposition: PACU - hemodynamically stable.  Procedure Details  The patient was seen in the Holding Room. The risks, benefits, complications, treatment options, and expected outcomes were discussed with the patient.  The patient concurred with the proposed plan, giving informed consent.  The site of surgery properly noted/marked. The patient was identified as Kathryn Stanley and the procedure verified as a Robotic-assisted hysterectomy with bilateral salpingo oophorectomy. A Time Out was held and the above information confirmed.  After induction of anesthesia, the patient was draped and prepped in the usual sterile manner. Pt was placed in supine position after anesthesia and draped and prepped in the usual sterile manner. The abdominal drape was placed after the CholoraPrep had been allowed to dry for 3 minutes.  Her arms were tucked to her side with all appropriate precautions.  The  shoulders were stabilized with padded shoulder blocks applied to the acromium processes.  The patient was placed in the semi-lithotomy position in [name].  The perineum was prepped with Betadine. The patient was then prepped. Foley catheter was placed.  A sterile speculum was placed in the vagina.  The cervix was grasped with a single-tooth tenaculum and dilated with Kennon Rounds dilators.  The ZUMI uterine manipulator with a medium colpotomizer ring was placed without difficulty.  A pneum occluder balloon was placed over the manipulator.  OG tube placement was confirmed and to suction.   Next, a 5 mm skin incision was made 1 cm below the subcostal margin in the midclavicular line.  The 5 mm Optiview port and scope was used for direct entry.  Opening pressure was under 10 mm CO2.  The abdomen was insufflated and the findings were noted as above.   At this point and all points during the procedure, the patient's intra-abdominal pressure did not exceed 15 mmHg. Next, a 10 mm skin incision was made in the umbilicus and a right and left port was placed about 10 cm lateral to the robot port on the right and left side.    All ports were placed under direct visualization.  The patient was placed in steep Trendelenburg.  Bowel was folded away into the upper abdomen.  The robot was docked in the normal manner.  The hysterectomy was started after the round ligament on the right side was incised and the retroperitoneum was entered and the pararectal space was developed.  The ureter was noted to be on the medial leaf of the broad ligament.  The peritoneum above the ureter  was incised and stretched and the infundibulopelvic ligament was skeletonized, cauterized and cut.  The posterior peritoneum was taken down to the level of the KOH ring.  The anterior peritoneum was also taken down.  The bladder flap was created to the level of the KOH ring.  The uterine artery on the right side was skeletonized, cauterized and cut in the  normal manner.  A similar procedure was performed on the left.  The colpotomy was made and the uterus, cervix, bilateral ovaries and tubes were amputated and delivered through the vagina.  Pedicles were inspected and excellent hemostasis was achieved.    The colpotomy at the vaginal cuff was closed with Vicryl on a CT1 needle in a running manner.  Irrigation was used and excellent hemostasis was achieved.  At this point in the procedure was completed.  Robotic instruments were removed under direct visulaization.  The robot was undocked. The 10 mm ports were closed with Vicryl on a UR-5 needle and the fascia was closed with 0 Vicryl on a UR-5 needle.  The skin was closed with 4-0 Vicryl in a subcuticular manner.  Dermabond was applied.  Quarter percent marcaine was infiltrated into the incisions. Sponge, lap and needle counts correct x 2.  The patient was taken to the recovery room in stable condition.  The vagina was swabbed with  minimal bleeding noted.   All instrument and needle counts were correct x  3.   The patient was transferred to the recovery room in a stable condition.  Donaciano Eva, MD

## 2017-11-04 ENCOUNTER — Encounter (HOSPITAL_COMMUNITY): Payer: Self-pay | Admitting: Gynecologic Oncology

## 2017-11-10 ENCOUNTER — Encounter: Payer: Self-pay | Admitting: Radiation Oncology

## 2017-11-10 NOTE — Progress Notes (Signed)
  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     Plan: The patient has completed radiation treatment. The patient will return to radiation oncology clinic for routine followup in one month. I advised them to call or return sooner if they have any questions or concerns related to their recovery or treatment.  ------------------------------------------------  Jodelle Gross, MD, PhD  This document serves as a record of services personally performed by [name], MD. It was created on his behalf by Linward Natal, a trained medical scribe. The creation of this record is based on the scribe's personal observations and the provider's statements to them. This document has been checked and approved by the attending provider.

## 2017-11-13 NOTE — Progress Notes (Signed)
[place]  [phone] [phone]  Clinic Follow-up Note   Patient Care Team: [name], MD as PCP - General (Internal Medicine) [name], MD as Consulting Physician (General Surgery) [name], MD as Consulting Physician (Hematology) [name], MD as Consulting Physician (Radiation Oncology)   Date of Service:  11/16/2017  CHIEF COMPLAINTS:  Follow-up for Malignant neoplasm of upper-outer quadrant of left breast in female, estrogen receptor positive   Oncology History   Cancer Staging Malignant neoplasm of upper-outer quadrant of left breast in female, estrogen receptor positive ([place]) Staging form: Breast, AJCC 8th Edition - Clinical stage from 12/29/2016: Stage IIIA (cT2, cN1, cM0, G3, ER: Positive, PR: Negative, HER2: Negative) - Signed by [name], MD on 01/07/2017 - Pathologic: No Stage Recommended (ypT1c, pN0, cM0, G2, ER-, PR-, HER2-) - Unsigned Cancer Staging Malignant neoplasm of upper-outer quadrant of left breast in female, estrogen receptor positive ([place]) Staging form: Breast, AJCC 8th Edition - Clinical stage from 12/29/2016: Stage IIIA (cT2, cN1, cM0, G3, ER: Positive, PR: Negative, HER2: Negative) - Signed by [name], MD on 01/07/2017 - Pathologic: No Stage Recommended (ypT1c, pN0, cM0, G2, ER-, PR-, HER2-) - Unsigned       Malignant neoplasm of upper-outer quadrant of left breast in female, estrogen receptor positive ([place])   12/25/2016 Mammogram    [place] and [facility] Diagnostic Breast Tomo Bilateral 12/25/16 IMPRESSION: 1. Suspicious mass 2.3 x 1.6 x 3.0 cm  in the 130 o'clock location of the left breast 8cm from the nipple, biopsy recommended. Adjacent simple cyst is 1.9 cm. 2. Suspicious left axillary lymph node 2.2cm for which biopsy is indicated. 3. Indeterminate group of calcifications in the upper central portion of the left breast for which biopsy is recommended.       12/29/2016 Initial Biopsy    Diagnosis  12/29/16 1. Breast, left, needle core biopsy, stereotactic, upper inner quadrant - FIBROCYSTIC CHANGES INCLUDING APOCRINE METAPLASIA WITH CALCIFICATIONS - NO MALIGNANCY IDENTIFIED 2. Breast, left, needle core biopsy, ultrasound, 1:30 o'clock - INVASIVE MAMMARY CARCINOMA, G3 - SEE COMMENT 3. Lymph node, needle/core biopsy, ultrasound, left axillary - METASTIC CARCINOMA INVOLVING ONE LYMPH NODE (1/1) E-cadherin is positive supporting a ductal origin.       12/29/2016 Receptors her2    ER 15%+, weak staining  PR - HER2- Ki67 10%       12/29/2016 Initial Diagnosis    Malignant neoplasm of upper-outer quadrant of left breast in female, estrogen receptor positive ([place])      01/13/2017 Echocardiogram    ECHO 01/13/17 Study Conclusions - Left ventricle: The cavity size was normal. Systolic function was   normal. The estimated ejection fraction was in the range of 60%   to 65%. Wall motion was normal; there were no regional wall   motion abnormalities. Left ventricular diastolic function   parameters were normal. - Mitral valve: There was trivial regurgitation. - Right atrium: The atrium was mildly dilated. - Tricuspid valve: There was trivial regurgitation.       01/14/2017 Surgery    Port placement by Dr. Donne Hazel       01/15/2017 Imaging    Whole Body bone Scan 01/15/17 IMPRESSION: 1.  No significant abnormality identified.      01/15/2017 Imaging    CT CAP W Contrast 01/15/17 IMPRESSION: 1. Left lateral breast mass with pathologic left axillary adenopathy including a 2.0 cm left axillary lymph node. 2. Several hypodense lesions in the liver are likely benign lesions such as cysts.  However, these are technically too small to characterize by CT. Possibilities for further workup include surveillance or hepatic protocol MRI with and without contrast. 3. There is also a tiny hypodense lesion in the spleen which is technically nonspecific but statistically highly likely to  be benign. 4.  Prominent stool throughout the colon favors constipation      01/16/2017 - 06/05/2017 Chemotherapy    Neoadjuvant chemo AC every 2 weeks for 4 cycles starting on 01/16/2017 and ended 02/27/17. Then proceed with weekly taxol for 12 treatments starting 03/13/17. Added Carboplatin AUC 2 on 04/03/17 to weekly taxol. Due to symptomatic thrombocytopenia we held Carbo since cycle 8. Cycle 9 was post-poned due to neutropenia and Granix was added starting 05/12/17. Added carbo back at reduced dose AUC 1.5 with cycle 10 on 05/22/17. 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   Analysis also detected a Variant of Uncertain Significance (VUS) in the NBN gene called c.628G>T (p.Val210Phe)      06/12/2017 Imaging    MR BREAST BILATERAL WO CONTRAST IMPRESSION: 1. Significant positive response to neoadjuvant chemotherapy. The index carcinoma in the left breast has significantly decreased in size. Left axillary adenopathy has also significantly improved. 2. No other evidence of malignancy.      07/16/2017 Surgery    Pt underwent a left mastectomy with Dr. Donne Hazel      07/16/2017 Pathology Results    Diagnosis 07/16/17 1. Breast, simple mastectomy, Right Prophylactic - FIBROCYSTIC CHANGES INCLUDING APOCRINE METAPLASIA - DUCT ECTASIA - CALCIFICATIONS - NO MALIGNANCY IDENTIFIED 2. Lymph node, sentinel, biopsy, Left Axillary - NO CARCINOMA IDENTIFIED IN ONE LYMPH NODE (0/1) - SEE COMMENT 3. Lymph node, sentinel, biopsy, Left - NO CARCINOMA IDENTIFIED IN ONE LYMPH NODE (0/1) - SEE COMMENT 4. Breast, simple mastectomy, Left - INVASIVE DUCTAL CARCINOMA, NOTTINGHAM GRADE 2 OF 3, 1.3 CM - MARGINS UNINVOLVED BY CARCINOMA (2 CM POSTERIOR MARGIN) - PREVIOUS BIOPSY SITE CHANGES - CALCIFICATIONS - SEE ONCOLOGY TABLE BELOW      07/16/2017 Receptors her2     Results: IMMUNOHISTOCHEMICAL AND MORPHOMETRIC ANALYSIS PERFORMED MANUALLY Estrogen Receptor: 0%, NEGATIVE Progesterone Receptor: 0%, NEGATIVE Results: HER2 - NEGATIVE RATIO OF HER2/CEP17 SIGNALS 1.73 AVERAGE HER2 COPY NUMBER PER CELL 2.67      09/02/2017 - 10/16/2017 Radiation Therapy    Adjuvant radiation per Dr. Lisbeth Romanello       11/03/2017 Surgery    XI ROBOTIC ASSISTED TOTAL HYSTERECTOMY WITH BILATERAL SALPINGO OOPHORECTOMY  by Dr. Denman George and Dr. Delsa Sale 11/03/17       Chemotherapy    PENDING Xeloda 2028m BID 2 weeks on and 1 week off starting on 11/23/17 for 2-3 months        HISTORY OF PRESENTING ILLNESS: 01/07/17 Kathryn Stanley 42y.o. female is here because of newly diagnosed Malignant neoplasm of upper-outer quadrant of left breast in female, estrogen receptor positive. She presents to the breast clinic today with her common law husband. She felt the lump herself 3 months ago. She felt pain like "stabbing" first. She then had a mammogram 12/25/16 and subsequent biopsy   In the past she was using smoking match and quit smoking "cold [place] that was 1 month ago.   Today she reports her breast pain still comes and goes. She has not noticed any new changes. She recently has seen PCP because of lump and does  not have a Editor, commissioning so she had not gotten a mammogram before. Lately has regular period. She has a weak cervix and her children were born premature. She is currently working at [employer] and works on her feet all the time, working 5 days a week.   GYN HISTORY  Menarchal: 10 LMP: 12/10/16 Contraceptive: No HRT: NA GP: G1P2 - twins    CURRENT THERAPY:  PENDING Xeloda 2063m BID 2 weeks on and 1 week off starting on 11/23/17 for 2-3 months   INTERVAL HISTORY:  Kathryn HIERONYMUSis here for a follow-up post radiation and BSO surgery. She presents to the clinic today accompanied by her husband. She notes her surgery went well. She notes her swelling and soreness is  minimal. She will see if she can return to work after her follow up with Dr. RDenman George She plans to proceed with breast cancer reconstruction in 01/2018. She notes some skin discoloration and peeling from radiation but overall recovered well.    On review of symptoms, pt notes her appetite and eating is doing well. She denies any residual effects from prior chemotherapy.     MEDICAL HISTORY:  Past Medical History:  Diagnosis Date  . Breast cancer, left breast ([place] dx'd 01/2017  . Complication of anesthesia   . Dermatologic problem    possible psoriasis. Pt has not had been diagnosed by dermatology.  . 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 GERD (gastroesophageal reflux disease)   . PONV (postoperative nausea and vomiting)   . Tobacco dependence    trying to quit with nicotene patches    SURGICAL HISTORY: Past Surgical History:  Procedure Laterality Date  . BREAST BIOPSY Left 12/2016  . BREAST RECONSTRUCTION Bilateral 07/16/2017   BILATERAL BREAST RECONSTRUCTION WITH PLACEMENT OF TISSUE EXPANDER AND ALLODERM/notes 07/16/2017  . CESAREAN SECTION  2002  . MASTECTOMY Bilateral 07/16/2017   LEFT SKIN SPARING MASTECTOMY WITH LEFT RADIOACTIVE SEED TARGETED AXILLARY LYMPH NODE EXCISION AND AXILLARY SENTINEL LYMPH NODE BIOPSY; RIGHT PROPHYLACTIC SKIN SPARING MASTECTOMY /[name]2/12/2017  . MASTECTOMY WITH RADIOACTIVE SEED GUIDED EXCISION AND AXILLARY SENTINEL LYMPH NODE BIOPSY Left 07/16/2017   Procedure: LEFT SKIN SPARING MASTECTOMY WITH LEFT RADIOACTIVE SEED TARGETED AXILLARY LYMPH NODE EXCISION AND AXILLARY SENTINEL LYMPH NODE BIOPSY;  Surgeon: WRolm Bookbinder MD;  [facility]  Service: General;  Laterality: Left;  . NIPPLE SPARING MASTECTOMY Right 07/16/2017   Procedure: RIGHT PROPHYLACTIC SKIN SPARING MASTECTOMY;  Surgeon: WRolm Bookbinder MD;  [facility]  Service: General;  Laterality: Right;  .  PORT-A-CATH REMOVAL N/A 07/16/2017   Procedure: REMOVAL PORT-A-CATH;  Surgeon: WRolm Bookbinder MD;  [facility]  Service: General;  Laterality: N/A;  . PORTA CATH REMOVAL  07/16/2017  . PORTACATH PLACEMENT N/A 01/14/2017   Procedure: INSERTION PORT-A-CATH WITH [place]  Surgeon: WRolm Bookbinder MD;  [facility]  Service: General;  Laterality: N/A;  . ROBOTIC ASSISTED TOTAL HYSTERECTOMY WITH BILATERAL SALPINGO OOPHERECTOMY Bilateral 11/03/2017   Procedure: XI ROBOTIC ASSISTED TOTAL HYSTERECTOMY WITH BILATERAL SALPINGO OOPHORECTOMY;  Surgeon: REveritt Amber MD;  Location: [facility];  Service: Gynecology;  Laterality: Bilateral;    SOCIAL HISTORY: Social History   Socioeconomic History  . Marital status: Married    Spouse name: Not on file  . Number of children: Not on file  . Years of education: Not on file  . Highest education level: Not on file  Occupational History  . Not on  file  Social Needs  . Financial resource strain: Not on file  . Food insecurity:    Worry: Not on file    Inability: Not on file  . Transportation needs:    Medical: Not on file    Non-medical: Not on file  Tobacco Use  . Smoking status: Former Smoker    Packs/day: 1.00    Years: 17.00    Pack years: 17.00    Types: Cigarettes    Last attempt to quit: 12/15/2016    Years since quitting: 0.9  . Smokeless tobacco: Never Used  Substance and Sexual Activity  . Alcohol use: No  . Drug use: No  . Sexual activity: Yes    Birth control/protection: None  Lifestyle  . Physical activity:    Days per week: Not on file    Minutes per session: Not on file  . Stress: Not on file  Relationships  . Social connections:    Talks on phone: Not on file    Gets together: Not on file    Attends religious service: Not on file    Active member of club or organization: Not on file    Attends meetings of clubs or organizations: Not on file    Relationship status: Not on file  . Intimate partner violence:    Fear of  current or ex partner: Not on file    Emotionally abused: Not on file    Physically abused: Not on file    Forced sexual activity: Not on file  Other Topics Concern  . Not on file  Social History Narrative  . Not on file    FAMILY HISTORY: Family History  Problem Relation Age of Onset  . Stroke Maternal Grandmother   . Stroke Father   . Hypertension Father   . Other Father        Adopted    ALLERGIES:  is allergic to amoxicillin; other; chlorhexidine gluconate; and nicotine.  MEDICATIONS:  Current Outpatient Medications  Medication Sig Dispense Refill  . famotidine (PEPCID) 10 MG tablet Take 10 mg by mouth daily as needed for heartburn or indigestion.    Marland Kitchen ibuprofen (ADVIL,MOTRIN) 200 MG tablet Take 200-400 mg by mouth daily as needed for headache or moderate pain.    . Multiple Vitamin (MULTIVITAMIN WITH MINERALS) TABS tablet Take 1 tablet by mouth daily at 12 noon.    . capecitabine (XELODA) 500 MG tablet Take 4 tablets (2,000 mg total) by mouth 2 (two) times daily after a meal. 112 tablet 0   No current facility-administered medications for this visit.    REVIEW OF SYSTEMS:  Constitutional: Denies abnormal night sweats. (+) good appetite Eyes: Denies blurriness of vision, double vision or watery eyes Ears, nose, mouth, throat, and face: Denies mucositis or sore throat Respiratory: Denies dyspnea or wheezes   Cardiovascular: Denies palpitation, chest discomfort  Gastrointestinal:  Denies nausea, heartburn. (+) mild abdominal and pelvic soreness from surgery  Skin: Denies abnormal skin rashes (+) skin discoloration and peeling from radiation Lymphatics: Denies new lymphadenopathy or easy bruising Neurological:Denies numbness, tingling or new weaknesses MSK: negative Behavioral/Psych: Mood is stable, no new changes  All other systems were reviewed with the patient and are negative.  PHYSICAL EXAMINATION:  ECOG PERFORMANCE STATUS: 1  Vitals:   11/16/17 1328  BP:  122/85  Pulse: 96  Resp: 18  Temp: 98.2 F (36.8 C)  SpO2: 98%   Filed Weights   11/16/17 1328  Weight: 200 lb 14.4 oz (91.1  kg)     GENERAL:alert, no distress and comfortable SKIN: skin color, texture, turgor are normal, no rashes or significant lesions EYES: normal, conjunctiva are pink and non-injected, sclera clear OROPHARYNX:no exudate, no erythema and lips, buccal mucosa, and tongue normal  NECK: supple, thyroid normal size, non-tender, without nodularity LYMPH:  no palpable lymphadenopathy in the cervical, axillary or inguinal LUNGS: clear to auscultation and percussion with normal breathing effort HEART: regular rate & rhythm and no murmurs and no lower extremity edema ABDOMEN:abdomen soft, non-tender and normal bowel sounds (+) S/p BSO and hysterectomy: 3 abdominal surgical incisions healed well.  Musculoskeletal:no cyanosis of digits and no clubbing  PSYCH: alert & oriented x 3 with fluent speech NEURO: no focal motor/sensory deficits Breast: Breast exam deferred today  LABORATORY DATA:  I have reviewed the data as listed CBC Latest Ref Rng & Units 11/16/2017 10/30/2017 09/30/2017  WBC 3.9 - 10.3 K/uL 5.0 5.0 4.3  Hemoglobin 11.6 - 15.9 g/dL 13.5 12.4 12.4  Hematocrit 34.8 - 46.6 % 39.6 37.6 36.6  Platelets 145 - 400 K/uL 217 181 197    CMP Latest Ref Rng & Units 11/16/2017 10/30/2017 09/30/2017  Glucose 70 - 140 mg/dL 108 108(H) 113  BUN 7 - 26 mg/dL _0 Creatinine 0.60 - 1.10 mg/dL 0.83 0.62 0.93  Sodium 136 - 145 mmol/L 140 139 139  Potassium 3.5 - 5.1 mmol/L 3.8 3.6 3.5  Chloride 98 - 109 mmol/L 105 109 104  CO2 22 - 29 mmol/L _1 Calcium 8.4 - 10.4 mg/dL 9.6 8.3(L) 9.0  Total Protein 6.4 - 8.3 g/dL 7.5 6.8 7.0  Total Bilirubin 0.2 - 1.2 mg/dL 0.3 0.3 0.3  Alkaline Phos 40 - 150 U/L 96 79 80  AST 5 - 34 U/L _2 ALT 0 - 55 U/L 46 36 44     PATHOLOGY  Diagnosis 11/03/17 Uterus, cervix and bilateral fallopian tubes, with ovaries - BENIGN  PROLIFERATIVE ENDOMETRIUM. - BENIGN UNREMARKABLE CERVIX. - BENIGN FOLLICULAR CYSTS OF BILATERAL OVARIES. - BENIGN UNREMARKABLE BILATERAL FALLOPIAN TUBES. - NEGATIVE FOR HYPERPLASIA OR MALIGNANCY.  Cytology Diagnosis 11/03/17 PERITONEAL WASHING(SPECIMEN 1 OF 1 COLLECTED 11/03/17): REACTIVE MESOTHELIAL CELLS PRESENT.   Diagnosis 12/29/16 1. Breast, left, needle core biopsy, stereotactic, upper inner quadrant - FIBROCYSTIC CHANGES INCLUDING APOCRINE METAPLASIA WITH CALCIFICATIONS - NO MALIGNANCY IDENTIFIED 2. Breast, left, needle core biopsy, ultrasound, 1:30 o'clock - INVASIVE MAMMARY CARCINOMA - SEE COMMENT 3. Lymph node, needle/core biopsy, ultrasound, left axillary - METASTIC CARCINOMA INVOLVING ONE LYMPH NODE (1/1) Microscopic Comment 2. The biopsy material shows an infiltrative proliferation of cells with large vesicular nuclei with conspicuous nucleoli, arranged linearly and in small clusters. There are scattered cells with marked pleomorphism and cytologic atypia. Based on the biopsy, the carcinoma appears Nottingham grade 3 of 3 and measures 0.9 cm in greatest linear extent. E-cadherin and prognostic markers (ER/PR/ki-67/HER2-FISH)are pending and will be reported in an addendum. Dr. Tresa Moore reviewed the case and agrees with the above diagnosis. This case was called to [facility] on December 30, 2016. Results: IMMUNOHISTOCHEMICAL AND MORPHOMETRIC ANALYSIS PERFORMED MANUALLY Estrogen Receptor: 15%, POSITIVE, WEAK STAINING INTENSITY Progesterone Receptor: 0%, NEGATIVE Proliferation Marker Ki67: 10% COMMENT: The negative hormone receptor study(ies) in this case has An internal positive control. Results: HER2 - NEGATIVE RATIO OF HER2/CEP17 SIGNALS 1.63 AVERAGE HER2 COPY NUMBER PER CELL 3.10  Diagnosis 07/16/17 1. Breast, simple mastectomy, Right Prophylactic - FIBROCYSTIC CHANGES INCLUDING APOCRINE METAPLASIA - DUCT ECTASIA -  CALCIFICATIONS - NO MALIGNANCY  IDENTIFIED 2. Lymph node, sentinel, biopsy, Left Axillary - NO CARCINOMA IDENTIFIED IN ONE LYMPH NODE (0/1) - SEE COMMENT 3. Lymph node, sentinel, biopsy, Left - NO CARCINOMA IDENTIFIED IN ONE LYMPH NODE (0/1) - SEE COMMENT 4. Breast, simple mastectomy, Left - INVASIVE DUCTAL CARCINOMA, NOTTINGHAM GRADE 2 OF 3, 1.3 CM - MARGINS UNINVOLVED BY CARCINOMA (2 CM POSTERIOR MARGIN) - PREVIOUS BIOPSY SITE CHANGES - CALCIFICATIONS - SEE ONCOLOGY TABLE BELOW Microscopic Comment 2. and 3. Cytokeratin AE1/3 was performed on the sentinel lymph nodes to exclude micrometastasis. There is no evidence of metastatic carcinoma by immunohistochemistry. 4. BREAST, STATUS POST NEOADJUVANT TREATMENT Procedure: Total mastectomy Laterality: Left Tumor Size: 1.3 cm (glass slide measurement) Histologic Type: Invasive carcinoma of no special type (ductal, not otherwise specified) Grade: Nottingham Grade 2 Tubular Differentiation: 3 Nuclear Pleomorphism: 3 Mitotic Count: 1 Ductal Carcinoma in Situ (DCIS): Not identified Regional Lymph Nodes: Number of Lymph Nodes Examined: 2 Number of Sentinel Lymph Nodes Examined: 2 Lymph Nodes with Macrometastases: 0 Lymph Nodes with Micrometastases: 0 2 of 5 FINAL for Kathryn Stanley, Kathryn Stanley ([id number]) Microscopic Comment(continued) Lymph Nodes with Isolated Tumor Cells: 0 Margins: Uninvolved by invasive carcinoma Invasive carcinoma, distance from closest margin: 2 cm (posterior/deep) DCIS, distance from closest margin: N/A Breast Prognostic Profile (pre-neoadjuvant case #: [id number]) Estrogen Receptor: Positive (15%, weak) Progesterone Receptor: Negative Her2: Negative (Ratio 1.63) Ki-67: 10% Will be repeated on the current case (Block #: [id number]) and the results reported separately. Residual Cancer Burden (RCB): Primary Tumor Bed: 13 mm x 10 mm Overall Cancer Cellularity: 20% Percentage of Cancer that is in Situ: N/A Number of Positive Lymph Nodes: 0 Diameter of  Largest Lymph Node metastasis: N/A Residual Cancer Burden : 1.611 Residual Cancer Burden Class: RCB-II Pathologic Stage Classification (p TNM, AJCC 8th Edition): Primary Tumor: ypT1c Regional Lymph Nodes: ypN0 COMMENT: E-cadherin immunohistochemistry was performed on the biopsy material and was positive supporting a ductal origin. Results: IMMUNOHISTOCHEMICAL AND MORPHOMETRIC ANALYSIS PERFORMED MANUALLY Estrogen Receptor: 0%, NEGATIVE Progesterone Receptor: 0%, NEGATIVE Results: HER2 - NEGATIVE RATIO OF HER2/CEP17 SIGNALS 1.73 AVERAGE HER2 COPY NUMBER PER CELL 2.67  PROCEDURES  ECHO 01/13/17 Study Conclusions - Left ventricle: The cavity size was normal. Systolic function was   normal. The estimated ejection fraction was in the range of 60%   to 65%. Wall motion was normal; there were no regional wall   motion abnormalities. Left ventricular diastolic function   parameters were normal. - Mitral valve: There was trivial regurgitation. - Right atrium: The atrium was mildly dilated. - Tricuspid valve: There was trivial regurgitation.  RADIOGRAPHIC STUDIES: I have personally reviewed the radiological images as listed and agreed with the findings in the report. No results found. ASSESSMENT & PLAN:  Kathryn Stanley is a 42 y.o. premenopausal female with no significant past medical history, presented with a palpable left breast mass.  1. Malignant neoplasm of upper-outer quadrant of left breast in female, ductal carcinoma, cT2N1M0, Stage IIIa, ER weakly +, PR - , HER2 - , Grade 3, ypT1cN0M0, triple negative  -We previously reviewed her mammogram and initial biopsy results in details with patient and her husband.  -Her tumor is weakly ER positive, PR negative, HER-2 negative, grade 3, she has positive lymph nodes, those are all high risks for recurrence. I recommend neoadjuvant or adjuvant chemotherapy to reduce her risk of recurrence after surgery. -we reviewed her staging scan CT from  01/15/17 in person. A few hypodense lesion were seen in the  liver, appear to be benign cysts. CT and bone scan otherwise negative for distant metastasis. 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 This was discussed with patient. -Patient underwent neoadjuvant chemotherapy with dose dense AC every 2 weeks for 4 cycles, and weekly carbo and Taxol for 12 weeks -Pt underwent a bilateral mastectomy with Dr. Donne Hazel on 07/16/17. Her pathology results reveal a 1.3 tumor removed with all clean margins. There were no positive lymph nodes for carcinoma. Her receptors indicate triple negative. -her tumor was weakly ER positive on initial biopsy, and became triple negative after neoadjuvant chemotherapy. I think the benefit of antiestrogen therapy is very small and I do not recommend it.   -She underwent radiation with Dr. Lisbeth Mcmannis 09/02/17-10/16/17. Recovered well.  -She completed total hysterectomy and BSO on 11/03/17, pathology was benign. She has recovered well mostly.   -Patient to follow up with her plastic surgeon regarding reconstruction surgery in August, 2019.  -due to her weekly ER on initial biopsy, and triple negative disease on her final surgical sample, I did not think she benefits much from adjuvant antiestrogen therapy. -Due to her triple negative disease, residual 1.3cm tumor and its agressiveness nature, I recommend adjuvant chemo Xeloda for 3 months. I reviewed side effects in great detail. She is interested. Will start Xeloda 2068m BID 2 weeks on and 1 week off on 11/23/17 for 2-3 months. If she is not able to tolerate we can adjust dose.  -I encouraged her to consider clinical trial for immunotherapy Keytruda in triple negative cancer. I discussed side effects and benefits. She is interested. After the patient completes Xeloda, she will be screened for adjuvant Keytruda clinical trial.  -If she develops fever, dehydration, significant or unexpected side effects she should contact the  clinic.  -Labs reviewed, and WNL.  -F/u every 3 weeks for toxicity check, before cycle 2.    2. Genetics: BRCA1 + -Due to her young age of diagnosis I suggested she get genetic testing. 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  -She underwent mastectomy with Dr. WDonne Hazelon 07/16/17 and will have  Reconstruction with plastic surgeon Dr. TAshley Mariner  -She underwent total hysterectomy and BSO with Dr. RDenman Georgeon 11/03/17.  -I previously explained when her children get older they need to get tested and start cancer screenings earlier.   3. Financial Support -They are not insured and are attempting to get an orange card.  -They have met our financial support/social worker  4. Transmantitis  -05/22/17 showed increase in AST at 44 and ALT at 102, likely secondary to chemotherapy -resolved now   5. Toenail Fungus  -I recommend treating this orally after she completes chemo and immunotherapy.  -Continue topical treatments for now.    PLAN:  -Prescribed Xeloda 20025mBID, 2 weeks on and 1 week off,  to start on 6/17 -Lab, and f/u in the week of 7/3    All questions were answered. The patient knows to call the clinic with any problems, questions or concerns. I spent 25 minutes counseling the patient face to face. The total time spent in the appointment was 30 minutes and more than 50% was on counseling.  I,Oneal Deputyam acting as scribe for YaTruitt MerleMD.   I have reviewed the above documentation for accuracy and completeness, and I agree  with the above.    [name]  11/16/2017

## 2017-11-14 ENCOUNTER — Other Ambulatory Visit: Payer: Self-pay | Admitting: Hematology

## 2017-11-14 DIAGNOSIS — Z170 Estrogen receptor positive status [ER+]: Principal | ICD-10-CM

## 2017-11-14 DIAGNOSIS — C50412 Malignant neoplasm of upper-outer quadrant of left female breast: Secondary | ICD-10-CM

## 2017-11-16 ENCOUNTER — Inpatient Hospital Stay (HOSPITAL_BASED_OUTPATIENT_CLINIC_OR_DEPARTMENT_OTHER): Payer: Medicaid Other | Admitting: Hematology

## 2017-11-16 ENCOUNTER — Inpatient Hospital Stay: Payer: Medicaid Other | Attending: Hematology

## 2017-11-16 ENCOUNTER — Telehealth: Payer: Self-pay | Admitting: Hematology

## 2017-11-16 ENCOUNTER — Encounter: Payer: Self-pay | Admitting: Hematology

## 2017-11-16 VITALS — BP 122/85 | HR 96 | Temp 98.2°F | Resp 18 | Ht 64.0 in | Wt 200.9 lb

## 2017-11-16 DIAGNOSIS — K219 Gastro-esophageal reflux disease without esophagitis: Secondary | ICD-10-CM

## 2017-11-16 DIAGNOSIS — Z923 Personal history of irradiation: Secondary | ICD-10-CM

## 2017-11-16 DIAGNOSIS — Z90722 Acquired absence of ovaries, bilateral: Secondary | ICD-10-CM | POA: Diagnosis not present

## 2017-11-16 DIAGNOSIS — C50412 Malignant neoplasm of upper-outer quadrant of left female breast: Secondary | ICD-10-CM

## 2017-11-16 DIAGNOSIS — F17210 Nicotine dependence, cigarettes, uncomplicated: Secondary | ICD-10-CM

## 2017-11-16 DIAGNOSIS — Z170 Estrogen receptor positive status [ER+]: Secondary | ICD-10-CM | POA: Insufficient documentation

## 2017-11-16 DIAGNOSIS — Z90710 Acquired absence of both cervix and uterus: Secondary | ICD-10-CM | POA: Diagnosis not present

## 2017-11-16 DIAGNOSIS — Z1501 Genetic susceptibility to malignant neoplasm of breast: Secondary | ICD-10-CM | POA: Diagnosis not present

## 2017-11-16 DIAGNOSIS — R948 Abnormal results of function studies of other organs and systems: Secondary | ICD-10-CM | POA: Diagnosis not present

## 2017-11-16 DIAGNOSIS — B351 Tinea unguium: Secondary | ICD-10-CM | POA: Diagnosis not present

## 2017-11-16 DIAGNOSIS — Z9221 Personal history of antineoplastic chemotherapy: Secondary | ICD-10-CM | POA: Insufficient documentation

## 2017-11-16 DIAGNOSIS — Z9012 Acquired absence of left breast and nipple: Secondary | ICD-10-CM | POA: Insufficient documentation

## 2017-11-16 LAB — CBC WITH DIFFERENTIAL (CANCER CENTER ONLY)
Basophils Absolute: 0 10*3/uL (ref 0.0–0.1)
Basophils Relative: 1 %
EOS ABS: 0.2 10*3/uL (ref 0.0–0.5)
EOS PCT: 4 %
HCT: 39.6 % (ref 34.8–46.6)
Hemoglobin: 13.5 g/dL (ref 11.6–15.9)
LYMPHS ABS: 1.2 10*3/uL (ref 0.9–3.3)
LYMPHS PCT: 25 %
MCH: 27.7 pg (ref 25.1–34.0)
MCHC: 34.1 g/dL (ref 31.5–36.0)
MCV: 81.2 fL (ref 79.5–101.0)
MONO ABS: 0.4 10*3/uL (ref 0.1–0.9)
MONOS PCT: 7 %
Neutro Abs: 3.2 10*3/uL (ref 1.5–6.5)
Neutrophils Relative %: 63 %
PLATELETS: 217 10*3/uL (ref 145–400)
RBC: 4.87 MIL/uL (ref 3.70–5.45)
RDW: 15.6 % — AB (ref 11.2–14.5)
WBC Count: 5 10*3/uL (ref 3.9–10.3)

## 2017-11-16 LAB — CMP (CANCER CENTER ONLY)
ALBUMIN: 4 g/dL (ref 3.5–5.0)
ALT: 46 U/L (ref 0–55)
AST: 24 U/L (ref 5–34)
Alkaline Phosphatase: 96 U/L (ref 40–150)
Anion gap: 9 (ref 3–11)
BUN: 14 mg/dL (ref 7–26)
CHLORIDE: 105 mmol/L (ref 98–109)
CO2: 26 mmol/L (ref 22–29)
CREATININE: 0.83 mg/dL (ref 0.60–1.10)
Calcium: 9.6 mg/dL (ref 8.4–10.4)
GFR, Est AFR Am: >60 mL/min (ref >60)
GFR, Estimated: >60 mL/min (ref >60)
GLUCOSE: 108 mg/dL (ref 70–140)
POTASSIUM: 3.8 mmol/L (ref 3.5–5.1)
Sodium: 140 mmol/L (ref 136–145)
Total Bilirubin: 0.3 mg/dL (ref 0.2–1.2)
Total Protein: 7.5 g/dL (ref 6.4–8.3)

## 2017-11-16 MED ORDER — CAPECITABINE 500 MG PO TABS: 1000.0000 mg/m2 | ORAL_TABLET | Freq: Two times a day (BID) | ORAL | 0 refills | Status: DC

## 2017-11-16 NOTE — Telephone Encounter (Signed)
Appointments scheduled AVS/Calendar printed per 6/10 los

## 2017-11-17 ENCOUNTER — Telehealth: Payer: Self-pay | Admitting: Hematology

## 2017-11-17 NOTE — Telephone Encounter (Signed)
Called pt re appt being moved per 6/11 sch msg - spoke w/ pt re appts.

## 2017-11-18 ENCOUNTER — Telehealth: Payer: Self-pay | Admitting: Pharmacist

## 2017-11-18 DIAGNOSIS — Z170 Estrogen receptor positive status [ER+]: Principal | ICD-10-CM

## 2017-11-18 DIAGNOSIS — C50412 Malignant neoplasm of upper-outer quadrant of left female breast: Secondary | ICD-10-CM

## 2017-11-18 MED ORDER — XELODA 500 MG PO TABS: 2000.0000 mg | ORAL_TABLET | Freq: Two times a day (BID) | ORAL | 0 refills | Status: DC

## 2017-11-18 MED ORDER — CAPECITABINE 500 MG PO TABS: 1000.0000 mg/m2 | ORAL_TABLET | Freq: Two times a day (BID) | ORAL | 0 refills | Status: DC

## 2017-11-18 MED FILL — CAPECITABINE 500 MG TABLET: 500 | 21 days supply | Qty: 112 | Fill #0

## 2017-11-18 NOTE — Telephone Encounter (Signed)
Oral Chemotherapy Pharmacist Encounter   I spoke with patient for overview of: Xeloda.   Counseled patient on administration, dosing, side effects, monitoring, drug-food interactions, safe handling, storage, and disposal.  Patient will take Xeloda 59m tablets, 4 tablets (20055m by mouth in AM and 4 tabs (200047mby mouth in PM, within 30 minutes of finishing meals, on days 1-14 of each 21 day cycle.   Xeloda start date: 11/23/17  Adverse effects include but are not limited to: fatigue, decreased blood counts, GI upset, diarrhea, and hand-foot syndrome.  Patient has anti-emetic on hand and knows to take it if nausea develops.   Patient will obtain anti diarrheal and alert the office of 4 or more loose stools above baseline.  Reviewed with patient importance of keeping a medication schedule and plan for any missed doses.  Ms. RenPealeiced understanding and appreciation.   All questions answered. Medication reconciliation performed and medication/allergy list updated.  Xeloda prescription will be made ready for pick-up from the [facility] (11/18/17).  She will pick it up sometime this week to be ready to start 6/17 AM. Copayment $3 per fill is affordable at this time.  Patient knows to call the office with questions or concerns. Oral Oncology Clinic will continue to follow.  Thank you,  JesJohny DrillingharmD, BCPS, BCOP  11/18/2017   1:56 PM Oral Oncology Clinic [phone]

## 2017-11-18 NOTE — Telephone Encounter (Signed)
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  Patient has previously received neoadjuvant ddAC x 4, followed by paclitaxel plus carboplatin x 12. Patient underwent bilateral mastectomies on 11/03/17 (BRCA+)  Plan is for 2-3 months of adjuvant therapy with Xeloda due to aggressiveness of neoplasm and young age to further reduce risk of recurrence  Labs from 11/16/17 assessed, OK for treatment.  Current medication list in Epic reviewed, no DDIs with Xeloda identified.  Prescription has been e-scribed to the [facility] for benefits analysis and approval. Patient noted to have Medicaid prescription insurance coverage.  Oral Oncology Clinic will continue to follow for initial counseling and start date.  Johny Drilling, PharmD, BCPS, BCOP  11/18/2017 1:09 PM Oral Oncology Clinic [phone]

## 2017-11-23 ENCOUNTER — Ambulatory Visit
Admission: RE | Admit: 2017-11-23 | Discharge: 2017-11-23 | Disposition: A | Discharge status: Home / Self Care | Payer: Medicaid Other | Source: Ambulatory Visit | Attending: Radiation Oncology | Admitting: Radiation Oncology

## 2017-11-23 ENCOUNTER — Encounter: Payer: Self-pay | Admitting: Radiation Oncology

## 2017-11-23 ENCOUNTER — Other Ambulatory Visit: Payer: Self-pay

## 2017-11-23 VITALS — BP 128/78 | HR 78 | Temp 97.7°F | Resp 18 | Ht 64.0 in | Wt 201.0 lb

## 2017-11-23 DIAGNOSIS — Z08 Encounter for follow-up examination after completed treatment for malignant neoplasm: Secondary | ICD-10-CM | POA: Diagnosis not present

## 2017-11-23 DIAGNOSIS — Z880 Allergy status to penicillin: Secondary | ICD-10-CM | POA: Insufficient documentation

## 2017-11-23 DIAGNOSIS — Z170 Estrogen receptor positive status [ER+]: Secondary | ICD-10-CM

## 2017-11-23 DIAGNOSIS — Z853 Personal history of malignant neoplasm of breast: Secondary | ICD-10-CM | POA: Insufficient documentation

## 2017-11-23 DIAGNOSIS — Z923 Personal history of irradiation: Secondary | ICD-10-CM | POA: Diagnosis not present

## 2017-11-23 DIAGNOSIS — C50912 Malignant neoplasm of unspecified site of left female breast: Secondary | ICD-10-CM | POA: Diagnosis present

## 2017-11-23 DIAGNOSIS — C50412 Malignant neoplasm of upper-outer quadrant of left female breast: Secondary | ICD-10-CM

## 2017-11-23 NOTE — Progress Notes (Signed)
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 During treatment she did very well with radiotherapy and did not have significant desquamation. She underwent robotic hysterectomy with BSo on 11/03/17 and has been doing really well since surgery. She returns for an evaluation with Dr. Denman George on Friday for cuff check.                            On review of systems, the patient states she is doing great. She denies any vaginal bleeding or discharge. She reports her skin is doing well and recovering well since radiation ended. She denies any desquamation at this time and only mild skin pigmentation.  ALLERGIES:  is allergic to amoxicillin; other; chlorhexidine gluconate; and nicotine.  Meds: Current Outpatient Medications  Medication Sig Dispense Refill  . capecitabine (XELODA) 500 MG tablet Take 4 tablets (2,000 mg total) by mouth 2 (two) times daily after a meal. Take for 14 days on, 7 days off, repeat every 21 days. 112 tablet 0  . famotidine (PEPCID) 10 MG tablet Take 10 mg by mouth daily as needed for heartburn or indigestion.    Marland Kitchen ibuprofen (ADVIL,MOTRIN) 200 MG tablet Take 200-400 mg by mouth daily as needed for headache or moderate pain.    . Multiple Vitamin (MULTIVITAMIN WITH MINERALS) TABS tablet Take 1 tablet by mouth daily at 12 noon.     No current facility-administered medications for this encounter.     Physical  Findings:  height is 5' 4"  (1.626 m) and weight is 201 lb (91.2 kg). Her oral temperature is 97.7 F (36.5 C). Her blood pressure is 128/78 and her pulse is 78. Her respiration is 18 and oxygen saturation is 100%.  Pain Assessment Pain Score: 0-No pain/10 In general this is a well appearing caucasian female in no acute distress. She's alert and oriented x4 and appropriate throughout the examination. Cardiopulmonary assessment is negative for acute distress and she exhibits normal effort. The left chest wall is intact with an in situ expander. There is no desquamation of the axilla, mastectomy scar line, or left supraclavicular region. There is mild hyperpigmentation hardly notable along the parameter of her treatment site.    Lab Findings: Lab Results  Component Value Date   WBC 5.0 11/16/2017   HGB 13.5 11/16/2017   HCT 39.6 11/16/2017   MCV 81.2 11/16/2017   PLT 217 11/16/2017     Radiographic Findings: No results found.  Impression/Plan: 1. 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 The patient has been doing well since completion of radiotherapy. We discussed that we would be happy to continue to follow her as needed, but she will also continue to follow up with Dr. Burr Medico in medical oncology. She was counseled on skin care as  well as measures to avoid sun exposure to this area.  2. Survivorship. We discussed the importance of survivorship evaluation and she is not currently scheduled for this, but will in the near future. She was also given the monthly calendar for access to resources offered within the cancer center. 3. Recent robotic hyst/bso. The patient will follow up with Dr. Denman George and will continue restrictions until cleared by Dr. Denman George.     Carola Rhine, PAC

## 2017-11-26 ENCOUNTER — Encounter (HOSPITAL_COMMUNITY): Payer: Self-pay | Admitting: *Deleted

## 2017-11-27 ENCOUNTER — Inpatient Hospital Stay (HOSPITAL_BASED_OUTPATIENT_CLINIC_OR_DEPARTMENT_OTHER): Payer: Medicaid Other | Admitting: Gynecologic Oncology

## 2017-11-27 ENCOUNTER — Encounter: Payer: Self-pay | Admitting: Gynecologic Oncology

## 2017-11-27 VITALS — BP 118/80 | HR 83 | Temp 97.7°F | Resp 20 | Ht 64.0 in | Wt 202.4 lb

## 2017-11-27 DIAGNOSIS — Z90722 Acquired absence of ovaries, bilateral: Secondary | ICD-10-CM | POA: Diagnosis not present

## 2017-11-27 DIAGNOSIS — Z1501 Genetic susceptibility to malignant neoplasm of breast: Secondary | ICD-10-CM

## 2017-11-27 DIAGNOSIS — Z90710 Acquired absence of both cervix and uterus: Secondary | ICD-10-CM | POA: Diagnosis not present

## 2017-11-27 DIAGNOSIS — C50412 Malignant neoplasm of upper-outer quadrant of left female breast: Secondary | ICD-10-CM | POA: Diagnosis not present

## 2017-11-27 DIAGNOSIS — Z1509 Genetic susceptibility to other malignant neoplasm: Secondary | ICD-10-CM

## 2017-11-27 NOTE — Progress Notes (Signed)
Consult Note: Gyn-Onc  Consult was requested by Dr. Burr Medico for the evaluation of Kathryn Stanley 42 y.o. female  CC:  Chief Complaint  Patient presents with  . BRCA1 gene mutation positive    Assessment/Plan:  Kathryn Stanley  is a 42 y.o.  year old with BRCA 1 germline mutation and left breast cancer s/p robotic assisted total hysterectomy, BSO on 11/03/17.  She is doing well postop and is cleared to return to work.   HPI: Kathryn Stanley is a 42 year old parous woman who is seen in consultation at the request of Dr Burr Medico for BRCA 1 deleterious mutation.  The patient reports that she was diagnosed with left breast cancer in July, 2018. It is weakly ER positive. She underwent bilateral mastectomies (after a BRCA 1 germline mutation was detected, in addition to adjuvant chemotherapy. She will commence left chest wall radiation in February, 2019.  The patient has no family history significant for breast/ovarian cancer. She has been pregnant once with twins and had a cesarean section. She has had no other abdominal surgeries. She has completed childbearing. Her reconstruction will be with expanders and implants.   She has been receiving chest wall radiation, due to be completed May 10th.  TVUS on 08/03/17 showed a normal uterus measuring 8.3cm x 4 x 5.3cm. Her ovaries bilaterally were normal. CA 125 was normal.   Interval Hx: On 11/03/17 she underwent an uncomplicated total robotic hysterectomy, BSO. Postop she has done well with no complaints.   Current Meds:  Outpatient Encounter Medications as of 11/27/2017  Medication Sig  . capecitabine (XELODA) 500 MG tablet Take 4 tablets (2,000 mg total) by mouth 2 (two) times daily after a meal. Take for 14 days on, 7 days off, repeat every 21 days.  . famotidine (PEPCID) 10 MG tablet Take 10 mg by mouth daily as needed for heartburn or indigestion.  Marland Kitchen ibuprofen (ADVIL,MOTRIN) 200 MG tablet Take 200-400 mg by mouth daily as needed for headache  or moderate pain.  . Multiple Vitamin (MULTIVITAMIN WITH MINERALS) TABS tablet Take 1 tablet by mouth daily at 12 noon.   No facility-administered encounter medications on file as of 11/27/2017.     Allergy:  Allergies  Allergen Reactions  . Amoxicillin Anaphylaxis and Rash    PATIENT HAS HAD A PCN REACTION WITH IMMEDIATE RASH, FACIAL/TONGUE/THROAT SWELLING, SOB, OR LIGHTHEADEDNESS WITH HYPOTENSION:  #  #  #  YES  #  #  #   Has patient had a PCN reaction causing severe rash involving mucus membranes or skin necrosis: No Has patient had a PCN reaction that required hospitalization: No Has patient had a PCN reaction occurring within the last 10 years: No If all of the above answers are "NO", then may proceed with Cephalosporin use.   . Other Other (See Comments)    Anesthesia-vomiting repeatedly with nausea.  . Chlorhexidine Gluconate Rash  . Nicotine Other (See Comments)    Patch caused soreness at application site    Social Hx:   Social History   Socioeconomic History  . Marital status: Married    Spouse name: Not on file  . Number of children: Not on file  . Years of education: Not on file  . Highest education level: Not on file  Occupational History  . Not on file  Social Needs  . Financial resource strain: Not on file  . Food insecurity:    Worry: Not on file    Inability: Not on  file  . Transportation needs:    Medical: Not on file    Non-medical: Not on file  Tobacco Use  . Smoking status: Former Smoker    Packs/day: 1.00    Years: 17.00    Pack years: 17.00    Types: Cigarettes    Last attempt to quit: 12/15/2016    Years since quitting: 0.9  . Smokeless tobacco: Never Used  Substance and Sexual Activity  . Alcohol use: No  . Drug use: No  . Sexual activity: Yes    Birth control/protection: None  Lifestyle  . Physical activity:    Days per week: Not on file    Minutes per session: Not on file  . Stress: Not on file  Relationships  . Social connections:     Talks on phone: Not on file    Gets together: Not on file    Attends religious service: Not on file    Active member of club or organization: Not on file    Attends meetings of clubs or organizations: Not on file    Relationship status: Not on file  . Intimate partner violence:    Fear of current or ex partner: Not on file    Emotionally abused: Not on file    Physically abused: Not on file    Forced sexual activity: Not on file  Other Topics Concern  . Not on file  Social History Narrative  . Not on file    Past Surgical Hx:  Past Surgical History:  Procedure Laterality Date  . BREAST BIOPSY Left 12/2016  . BREAST RECONSTRUCTION Bilateral 07/16/2017   BILATERAL BREAST RECONSTRUCTION WITH PLACEMENT OF TISSUE EXPANDER AND ALLODERM/notes 07/16/2017  . CESAREAN SECTION  2002  . MASTECTOMY Bilateral 07/16/2017   LEFT SKIN SPARING MASTECTOMY WITH LEFT RADIOACTIVE SEED TARGETED AXILLARY LYMPH NODE EXCISION AND AXILLARY SENTINEL LYMPH NODE BIOPSY; RIGHT PROPHYLACTIC SKIN SPARING MASTECTOMY [name] 07/16/2017  . MASTECTOMY WITH RADIOACTIVE SEED GUIDED EXCISION AND AXILLARY SENTINEL LYMPH NODE BIOPSY Left 07/16/2017   Procedure: LEFT SKIN SPARING MASTECTOMY WITH LEFT RADIOACTIVE SEED TARGETED AXILLARY LYMPH NODE EXCISION AND AXILLARY SENTINEL LYMPH NODE BIOPSY;  Surgeon: [name], MD;  Location: [facility];  Service: General;  Laterality: Left;  . NIPPLE SPARING MASTECTOMY Right 07/16/2017   Procedure: RIGHT PROPHYLACTIC SKIN SPARING MASTECTOMY;  Surgeon: [name], MD;  Location: [place];  Service: General;  Laterality: Right;  . PORT-A-CATH REMOVAL N/A 07/16/2017   Procedure: REMOVAL PORT-A-CATH;  Surgeon: [name], MD;  Location: [facility];  Service: General;  Laterality: N/A;  . PORTA CATH REMOVAL  07/16/2017  . PORTACATH PLACEMENT N/A 01/14/2017   Procedure: INSERTION PORT-A-CATH WITH [place];  Surgeon: [name], MD;  Location: [facility];  Service: General;  Laterality: N/A;   . ROBOTIC ASSISTED TOTAL HYSTERECTOMY WITH BILATERAL SALPINGO OOPHERECTOMY Bilateral 11/03/2017   Procedure: XI ROBOTIC ASSISTED TOTAL HYSTERECTOMY WITH BILATERAL SALPINGO OOPHORECTOMY;  Surgeon: [name], MD;  Location: [facility];  Service: Gynecology;  Laterality: Bilateral;    Past Medical Hx:  Past Medical History:  Diagnosis Date  . Breast cancer, left breast (Stan[place]) dx'd 01/2017  . Complication of anesthesia   . Dermatologic problem    possible psoriasis. Pt has not had been diagnosed by dermatology.  . 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 GERD (gastroesophageal reflux disease)   . PONV (postoperative nausea and vomiting)   . Tobacco dependence  trying to quit with nicotene patches    Past Gynecological History:  C/s x 1 Patient's last menstrual period was 01/07/2017 (approximate).  Family Hx:  Family History  Problem Relation Age of Onset  . Stroke Maternal Grandmother   . Stroke Father   . Hypertension Father   . Other Father        Adopted    Review of Systems:  Constitutional  Feels well,    ENT Normal appearing ears and nares bilaterally Skin/Breast  No rash, sores, jaundice, itching, dryness Cardiovascular  No chest pain, shortness of breath, or edema  Pulmonary  No cough or wheeze.  Gastro Intestinal  No nausea, vomitting, or diarrhoea. No bright red blood per rectum, no abdominal pain, change in bowel movement, or constipation.  Genito Urinary  No frequency, urgency, dysuria, no bleeding (amenorrheic since chemotherapy) Musculo Skeletal  No myalgia, arthralgia, joint swelling or pain  Neurologic  No weakness, numbness, change in gait,  Psychology  No depression, anxiety, insomnia.   Vitals:  Blood pressure 118/80, pulse 83, temperature 97.7 F (36.5 C), temperature source Oral, resp. rate 20, height 5' 4"  (1.626 m), weight 202 lb 6.4 oz (91.8 kg), last menstrual period  01/07/2017, SpO2 100 %.  Physical Exam: WD in NAD Neck  Supple NROM, without any enlargements.  Lymph Node Survey No cervical supraclavicular or inguinal adenopathy Cardiovascular  Pulse normal rate, regularity and rhythm. S1 and S2 normal.  Lungs  Clear to auscultation bilateraly, without wheezes/crackles/rhonchi. Good air movement.  Skin  No rash/lesions/breakdown  Psychiatry  Alert and oriented to person, place, and time  Abdomen  Normoactive bowel sounds, abdomen soft, non-tender and overweight without evidence of hernia. Well healed abdominal incisions x 4. Back No CVA tenderness Genito Urinary  Vaginal cuff healing normally, suture material in tact and present. Small amount of discharge but no tenderness or blood.  Rectal  deferred Extremities  No bilateral cyanosis, clubbing or edema.   Thereasa Solo, MD  11/27/2017, 2:58 PM

## 2017-11-27 NOTE — Patient Instructions (Signed)
Avoid heavy lifting for 1 more week.  No intercourse for 1 more month.  You are cleared for stairs, light exercise and bathing/swimming.  Contact Dr Denman George with questions or concerns at [phone].  You will not require pap tests in the future.

## 2017-11-30 ENCOUNTER — Telehealth: Payer: Self-pay

## 2017-11-30 NOTE — Telephone Encounter (Signed)
Incoming VM from patient and then call from husband - we have permission to speak with him.  He reports patient is on her way home, tearful because she started having spotting at work, noticed when wiped - it was on tissue when went to bathroom. Her VM and husband said it was her first day back at [employer]. She hasn't lifted anything heavy but on her feet a half of a shift.  Notified Joylene John NP.  Reviewed notes from appt here with Dr Denman George on Friday and pt had a spec exam. Per Melissa NP, spotting could be from that or increased activity, patient to monitor bleeding and if turns into bleeding like a period and not improving, to call our office back.  Continue previous instructions regarding heavy lifting.  Husband voiced understanding and will let pt know when she gets home.  Encouraged to call our office back for any further questions or concerns.

## 2017-12-07 NOTE — Progress Notes (Signed)
[facility]  [phone] [phone]  Clinic Follow up Note   Patient Care Team: Mosetta Anis, MD as PCP - Joylene Draft, MD as Consulting Physician (General Surgery) [name], MD as Consulting Physician (Hematology) [name], MD as Consulting Physician (Radiation Oncology) 12/08/2017  SUMMARY OF ONCOLOGIC HISTORY: Oncology History   Cancer Staging Malignant neoplasm of upper-outer quadrant of left breast in female, estrogen receptor positive ([place]) Staging form: Breast, AJCC 8th Edition - Clinical stage from 12/29/2016: Stage IIIA (cT2, cN1, cM0, G3, ER: Positive, PR: Negative, HER2: Negative) - Signed by [name], MD on 01/07/2017 - Pathologic: No Stage Recommended (ypT1c, pN0, cM0, G2, ER-, PR-, HER2-) - Unsigned Cancer Staging Malignant neoplasm of upper-outer quadrant of left breast in female, estrogen receptor positive ([place]) Staging form: Breast, AJCC 8th Edition - Clinical stage from 12/29/2016: Stage IIIA (cT2, cN1, cM0, G3, ER: Positive, PR: Negative, HER2: Negative) - Signed by [name], MD on 01/07/2017 - Pathologic: No Stage Recommended (ypT1c, pN0, cM0, G2, ER-, PR-, HER2-) - Unsigned       Malignant neoplasm of upper-outer quadrant of left breast in female, estrogen receptor positive ([place])   12/25/2016 Mammogram    [place] and [facility] Diagnostic Breast Tomo Bilateral 12/25/16 IMPRESSION: 1. Suspicious mass 2.3 x 1.6 x 3.0 cm  in the 130 o'clock location of the left breast 8cm from the nipple, biopsy recommended. Adjacent simple cyst is 1.9 cm. 2. Suspicious left axillary lymph node 2.2cm for which biopsy is indicated. 3. Indeterminate group of calcifications in the upper central portion of the left breast for which biopsy is recommended.       12/29/2016 Initial Biopsy    Diagnosis 12/29/16 1. Breast, left, needle core biopsy, stereotactic, upper inner quadrant - FIBROCYSTIC CHANGES INCLUDING APOCRINE METAPLASIA WITH  CALCIFICATIONS - NO MALIGNANCY IDENTIFIED 2. Breast, left, needle core biopsy, ultrasound, 1:30 o'clock - INVASIVE MAMMARY CARCINOMA, G3 - SEE COMMENT 3. Lymph node, needle/core biopsy, ultrasound, left axillary - METASTIC CARCINOMA INVOLVING ONE LYMPH NODE (1/1) E-cadherin is positive supporting a ductal origin.       12/29/2016 Receptors her2    ER 15%+, weak staining  PR - HER2- Ki67 10%       12/29/2016 Initial Diagnosis    Malignant neoplasm of upper-outer quadrant of left breast in female, estrogen receptor positive ([place])      01/13/2017 Echocardiogram    ECHO 01/13/17 Study Conclusions - Left ventricle: The cavity size was normal. Systolic function was   normal. The estimated ejection fraction was in the range of 60%   to 65%. Wall motion was normal; there were no regional wall   motion abnormalities. Left ventricular diastolic function   parameters were normal. - Mitral valve: There was trivial regurgitation. - Right atrium: The atrium was mildly dilated. - Tricuspid valve: There was trivial regurgitation.       01/14/2017 Surgery    Port placement by Dr. Donne Hazel       01/15/2017 Imaging    Whole Body bone Scan 01/15/17 IMPRESSION: 1.  No significant abnormality identified.      01/15/2017 Imaging    CT CAP W Contrast 01/15/17 IMPRESSION: 1. Left lateral breast mass with pathologic left axillary adenopathy including a 2.0 cm left axillary lymph node. 2. Several hypodense lesions in the liver are likely benign lesions such as cysts. However, these are technically too small to characterize by CT. Possibilities for further workup include surveillance or hepatic protocol MRI with and  without contrast. 3. There is also a tiny hypodense lesion in the spleen which is technically nonspecific but statistically highly likely to be benign. 4.  Prominent stool throughout the colon favors constipation      01/16/2017 - 06/05/2017 Chemotherapy    Neoadjuvant chemo AC every  2 weeks for 4 cycles starting on 01/16/2017 and ended 02/27/17. Then proceed with weekly taxol for 12 treatments starting 03/13/17. Added Carboplatin AUC 2 on 04/03/17 to weekly taxol. Due to symptomatic thrombocytopenia we held Carbo since cycle 8. Cycle 9 was post-poned due to neutropenia and Granix was added starting 05/12/17. Added carbo back at reduced dose AUC 1.5 with cycle 10 on 05/22/17. 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   Analysis also detected a Variant of Uncertain Significance (VUS) in the NBN gene called c.628G>T (p.Val210Phe)      06/12/2017 Imaging    MR BREAST BILATERAL WO CONTRAST IMPRESSION: 1. Significant positive response to neoadjuvant chemotherapy. The index carcinoma in the left breast has significantly decreased in size. Left axillary adenopathy has also significantly improved. 2. No other evidence of malignancy.      07/16/2017 Surgery    Pt underwent a left mastectomy with Dr. Donne Hazel      07/16/2017 Pathology Results    Diagnosis 07/16/17 1. Breast, simple mastectomy, Right Prophylactic - FIBROCYSTIC CHANGES INCLUDING APOCRINE METAPLASIA - DUCT ECTASIA - CALCIFICATIONS - NO MALIGNANCY IDENTIFIED 2. Lymph node, sentinel, biopsy, Left Axillary - NO CARCINOMA IDENTIFIED IN ONE LYMPH NODE (0/1) - SEE COMMENT 3. Lymph node, sentinel, biopsy, Left - NO CARCINOMA IDENTIFIED IN ONE LYMPH NODE (0/1) - SEE COMMENT 4. Breast, simple mastectomy, Left - INVASIVE DUCTAL CARCINOMA, NOTTINGHAM GRADE 2 OF 3, 1.3 CM - MARGINS UNINVOLVED BY CARCINOMA (2 CM POSTERIOR MARGIN) - PREVIOUS BIOPSY SITE CHANGES - CALCIFICATIONS - SEE ONCOLOGY TABLE BELOW      07/16/2017 Receptors her2    Results: IMMUNOHISTOCHEMICAL AND MORPHOMETRIC ANALYSIS PERFORMED MANUALLY Estrogen Receptor: 0%, NEGATIVE Progesterone Receptor: 0%,  NEGATIVE Results: HER2 - NEGATIVE RATIO OF HER2/CEP17 SIGNALS 1.73 AVERAGE HER2 COPY NUMBER PER CELL 2.67      09/02/2017 - 10/16/2017 Radiation Therapy    Adjuvant radiation per Dr. Lisbeth Huxtable       11/03/2017 Surgery    XI ROBOTIC ASSISTED TOTAL HYSTERECTOMY WITH BILATERAL SALPINGO OOPHORECTOMY  by Dr. Denman George and Dr. Delsa Sale 11/03/17       Chemotherapy    PENDING Xeloda 2088m BID 2 weeks on and 1 week off starting on 11/23/17 for 2-3 months     CURRENT THERAPY:  Xeloda 20056mBID 2 weeks on and 1 week off starting on 11/23/17 for 2-3 months   INTERVAL HISTORY: Ms. ReVirgeneturns for follow up as scheduled prior to cycle 2 Xeloda. She completed cycle 1 from 6/17 - 6/30. She developed frequent loose stool, 3-4 episodes per day. Denies nausea or vomiting, appetite is normal. No mucositis. She is back to work full time. Spends all day on her feet which are sore by end of the day. Mild redness and dry skin. No redness, peeling, or sensitivity to hands. Uses moisturizing lotion. She has "flare ups" of skin redness and dryness at baseline before starting chemo. She wonders if she has skin condition but has never sought dermatology eval. Symptoms are not worse than her baseline. No neuropathy, fever, chills, cough, dyspnea, or  chest pain. She continues to recover well from BSO and radiation.   REVIEW OF SYSTEMS:   Constitutional: Denies fatigue, fevers, chills or abnormal weight loss Eyes: Denies blurriness of vision Ears, nose, mouth, throat, and face: Denies mucositis or sore throat Respiratory: Denies cough, dyspnea or wheezes Cardiovascular: Denies palpitation, chest discomfort or lower extremity swelling Gastrointestinal:  Denies nausea, vomiting, constipation, diarrhea, heartburn or change in bowel habits (+) 3-4 loose BM per day Skin: Denies abnormal skin rashes (+) skin darkening to left breast (+) mild redness, dryness to feet  Lymphatics: Denies new lymphadenopathy or easy  bruising Neurological:Denies numbness, tingling or new weaknesses Behavioral/Psych: Mood is stable, no new changes  All other systems were reviewed with the patient and are negative.  MEDICAL HISTORY:  Past Medical History:  Diagnosis Date  . Breast cancer, left breast ([place]) dx'd 01/2017  . Complication of anesthesia   . Dermatologic problem    possible psoriasis. Pt has not had been diagnosed by dermatology.  . 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 GERD (gastroesophageal reflux disease)   . PONV (postoperative nausea and vomiting)   . Tobacco dependence    trying to quit with nicotene patches    SURGICAL HISTORY: Past Surgical History:  Procedure Laterality Date  . BREAST BIOPSY Left 12/2016  . BREAST RECONSTRUCTION Bilateral 07/16/2017   BILATERAL BREAST RECONSTRUCTION WITH PLACEMENT OF TISSUE EXPANDER AND ALLODERM/notes 07/16/2017  . CESAREAN SECTION  2002  . MASTECTOMY Bilateral 07/16/2017   LEFT SKIN SPARING MASTECTOMY WITH LEFT RADIOACTIVE SEED TARGETED AXILLARY LYMPH NODE EXCISION AND AXILLARY SENTINEL LYMPH NODE BIOPSY; RIGHT PROPHYLACTIC SKIN SPARING MASTECTOMY [name] 07/16/2017  . MASTECTOMY WITH RADIOACTIVE SEED GUIDED EXCISION AND AXILLARY SENTINEL LYMPH NODE BIOPSY Left 07/16/2017   Procedure: LEFT SKIN SPARING MASTECTOMY WITH LEFT RADIOACTIVE SEED TARGETED AXILLARY LYMPH NODE EXCISION AND AXILLARY SENTINEL LYMPH NODE BIOPSY;  Surgeon: [name], MD;  Location: [facility];  Service: General;  Laterality: Left;  . NIPPLE SPARING MASTECTOMY Right 07/16/2017   Procedure: RIGHT PROPHYLACTIC SKIN SPARING MASTECTOMY;  Surgeon: [name], MD;  Location: [facility];  Service: General;  Laterality: Right;  . PORT-A-CATH REMOVAL N/A 07/16/2017   Procedure: REMOVAL PORT-A-CATH;  Surgeon: [name], MD;  Location: [facility];  Service: General;  Laterality: N/A;  . PORTA CATH REMOVAL  07/16/2017  .  PORTACATH PLACEMENT N/A 01/14/2017   Procedure: INSERTION PORT-A-CATH WITH [place];  Surgeon: [name], MD;  Location: [facility];  Service: General;  Laterality: N/A;  . ROBOTIC ASSISTED TOTAL HYSTERECTOMY WITH BILATERAL SALPINGO OOPHERECTOMY Bilateral 11/03/2017   Procedure: XI ROBOTIC ASSISTED TOTAL HYSTERECTOMY WITH BILATERAL SALPINGO OOPHORECTOMY;  Surgeon: [name], MD;  Location: [facility];  Service: Gynecology;  Laterality: Bilateral;    I have reviewed the social history and family history with the patient and they are unchanged from previous note.  ALLERGIES:  is allergic to amoxicillin; other; chlorhexidine gluconate; and nicotine.  MEDICATIONS:  Current Outpatient Medications  Medication Sig Dispense Refill  . capecitabine (XELODA) 500 MG tablet Take 4 tablets (2,000 mg total) by mouth 2 (two) times daily after a meal. Take for 14 days on, 7 days off, repeat every 21 days. 112 tablet 0  . famotidine (PEPCID) 10 MG tablet Take 10 mg by mouth daily as needed for heartburn or indigestion.    Marland Kitchen ibuprofen (ADVIL,MOTRIN) 200 MG tablet Take 200-400 mg by mouth daily as needed for headache or moderate  pain.    . Multiple Vitamin (MULTIVITAMIN WITH MINERALS) TABS tablet Take 1 tablet by mouth daily at 12 noon.     No current facility-administered medications for this visit.     PHYSICAL EXAMINATION: ECOG PERFORMANCE STATUS: 1 - Symptomatic but completely ambulatory  Vitals:   12/08/17 1152  BP: 126/83  Pulse: 80  Resp: 18  Temp: 98.2 F (36.8 C)  SpO2: 99%   Filed Weights   12/08/17 1152  Weight: 199 lb 9.6 oz (90.5 kg)    GENERAL:alert, no distress and comfortable SKIN: no rashes or significant lesions. Mild plantar erythema with dry skin,  EYES: normal, Conjunctiva are pink and non-injected, sclera clear OROPHARYNX:no thrush or ulcers   LYMPH:  no palpable cervical, supraclavicular, or axillary lymphadenopathy LUNGS: clear to auscultation with normal breathing  effort HEART: regular rate & rhythm and no murmurs and no lower extremity edema ABDOMEN:abdomen soft, non-tender and normal bowel sounds. Laparoscopic sites are well healed, no erythema or drainage Musculoskeletal:no cyanosis of digits and no clubbing  NEURO: alert & oriented x 3 with fluent speech, no focal motor/sensory deficits BREASTS: inspection shows bilateral mastectomy with reconstruction, incisions are well healed. Left breast with radiation hyperpigmentation, no skin wound. No nodularity along chest wall or incisions  PAC site is well healed  LABORATORY DATA:  I have reviewed the data as listed CBC Latest Ref Rng & Units 12/08/2017 11/16/2017 10/30/2017  WBC 3.9 - 10.3 K/uL 4.3 5.0 5.0  Hemoglobin 11.6 - 15.9 g/dL 12.1 13.5 12.4  Hematocrit 34.8 - 46.6 % 34.1(L) 39.6 37.6  Platelets 145 - 400 K/uL 186 217 181     CMP Latest Ref Rng & Units 12/08/2017 11/16/2017 10/30/2017  Glucose 70 - 99 mg/dL 90 108 108(H)  BUN 6 - 20 mg/dL _0 Creatinine 0.44 - 1.00 mg/dL 0.83 0.83 0.62  Sodium 135 - 145 mmol/L 138 140 139  Potassium 3.5 - 5.1 mmol/L 3.8 3.8 3.6  Chloride 98 - 111 mmol/L 105 105 109  CO2 22 - 32 mmol/L _1 Calcium 8.9 - 10.3 mg/dL 9.5 9.6 8.3(L)  Total Protein 6.5 - 8.1 g/dL 7.2 7.5 6.8  Total Bilirubin 0.3 - 1.2 mg/dL 0.5 0.3 0.3  Alkaline Phos 38 - 126 U/L 85 96 79  AST 15 - 41 U/L _2 ALT 0 - 44 U/L 34 46 36   PATHOLOGY  Diagnosis 11/03/17 Uterus, cervix and bilateral fallopian tubes, with ovaries - BENIGN PROLIFERATIVE ENDOMETRIUM. - BENIGN UNREMARKABLE CERVIX. - BENIGN FOLLICULAR CYSTS OF BILATERAL OVARIES. - BENIGN UNREMARKABLE BILATERAL FALLOPIAN TUBES. - NEGATIVE FOR HYPERPLASIA OR MALIGNANCY.  Cytology Diagnosis 11/03/17 PERITONEAL WASHING(SPECIMEN 1 OF 1 COLLECTED 11/03/17): REACTIVE MESOTHELIAL CELLS PRESENT.   Diagnosis 12/29/16 1. Breast, left, needle core biopsy, stereotactic, upper inner quadrant - FIBROCYSTIC CHANGES INCLUDING  APOCRINE METAPLASIA WITH CALCIFICATIONS - NO MALIGNANCY IDENTIFIED 2. Breast, left, needle core biopsy, ultrasound, 1:30 o'clock - INVASIVE MAMMARY CARCINOMA - SEE COMMENT 3. Lymph node, needle/core biopsy, ultrasound, left axillary - METASTIC CARCINOMA INVOLVING ONE LYMPH NODE (1/1) Microscopic Comment 2. The biopsy material shows an infiltrative proliferation of cells with large vesicular nuclei with conspicuous nucleoli, arranged linearly and in small clusters. There are scattered cells with marked pleomorphism and cytologic atypia. Based on the biopsy, the carcinoma appears Nottingham grade 3 of 3 and measures 0.9 cm in greatest linear extent. E-cadherin and prognostic markers (ER/PR/ki-67/HER2-FISH)are pending and will be reported in an addendum. Dr. Tresa Moore reviewed the case  and agrees with the above diagnosis. This case was called to [facility] on December 30, 2016. Results: IMMUNOHISTOCHEMICAL AND MORPHOMETRIC ANALYSIS PERFORMED MANUALLY Estrogen Receptor: 15%, POSITIVE, WEAK STAINING INTENSITY Progesterone Receptor: 0%, NEGATIVE Proliferation Marker Ki67: 10% COMMENT: The negative hormone receptor study(ies) in this case has An internal positive control. Results: HER2 - NEGATIVE RATIO OF HER2/CEP17 SIGNALS 1.63 AVERAGE HER2 COPY NUMBER PER CELL 3.10  Diagnosis 07/16/17 1. Breast, simple mastectomy, Right Prophylactic - FIBROCYSTIC CHANGES INCLUDING APOCRINE METAPLASIA - DUCT ECTASIA - CALCIFICATIONS - NO MALIGNANCY IDENTIFIED 2. Lymph node, sentinel, biopsy, Left Axillary - NO CARCINOMA IDENTIFIED IN ONE LYMPH NODE (0/1) - SEE COMMENT 3. Lymph node, sentinel, biopsy, Left - NO CARCINOMA IDENTIFIED IN ONE LYMPH NODE (0/1) - SEE COMMENT 4. Breast, simple mastectomy, Left - INVASIVE DUCTAL CARCINOMA, NOTTINGHAM GRADE 2 OF 3, 1.3 CM - MARGINS UNINVOLVED BY CARCINOMA (2 CM POSTERIOR MARGIN) - PREVIOUS BIOPSY SITE CHANGES - CALCIFICATIONS - SEE ONCOLOGY  TABLE BELOW Microscopic Comment 2. and 3. Cytokeratin AE1/3 was performed on the sentinel lymph nodes to exclude micrometastasis. There is no evidence of metastatic carcinoma by immunohistochemistry. 4. BREAST, STATUS POST NEOADJUVANT TREATMENT Procedure: Total mastectomy Laterality: Left Tumor Size: 1.3 cm (glass slide measurement) Histologic Type: Invasive carcinoma of no special type (ductal, not otherwise specified) Grade: Nottingham Grade 2 Tubular Differentiation: 3 Nuclear Pleomorphism: 3 Mitotic Count: 1 Ductal Carcinoma in Situ (DCIS): Not identified Regional Lymph Nodes: Number of Lymph Nodes Examined: 2 Number of Sentinel Lymph Nodes Examined: 2 Lymph Nodes with Macrometastases: 0 Lymph Nodes with Micrometastases: 0 2 of 5 FINAL for DEZIRAY, NABI ([id number]) Microscopic Comment(continued) Lymph Nodes with Isolated Tumor Cells: 0 Margins: Uninvolved by invasive carcinoma Invasive carcinoma, distance from closest margin: 2 cm (posterior/deep) DCIS, distance from closest margin: N/A Breast Prognostic Profile (pre-neoadjuvant case #: [id number]) Estrogen Receptor: Positive (15%, weak) Progesterone Receptor: Negative Her2: Negative (Ratio 1.63) Ki-67: 10% Will be repeated on the current case (Block #: [id number]) and the results reported separately. Residual Cancer Burden (RCB): Primary Tumor Bed: 13 mm x 10 mm Overall Cancer Cellularity: 20% Percentage of Cancer that is in Situ: N/A Number of Positive Lymph Nodes: 0 Diameter of Largest Lymph Node metastasis: N/A Residual Cancer Burden : 1.611 Residual Cancer Burden Class: RCB-II Pathologic Stage Classification (p TNM, AJCC 8th Edition): Primary Tumor: ypT1c Regional Lymph Nodes: ypN0 COMMENT: E-cadherin immunohistochemistry was performed on the biopsy material and was positive supporting a ductal origin. Results: IMMUNOHISTOCHEMICAL AND MORPHOMETRIC ANALYSIS PERFORMED MANUALLY Estrogen Receptor: 0%,  NEGATIVE Progesterone Receptor: 0%, NEGATIVE Results: HER2 - NEGATIVE RATIO OF HER2/CEP17 SIGNALS 1.73 AVERAGE HER2 COPY NUMBER PER CELL 2.67  PROCEDURES  ECHO 01/13/17 Study Conclusions - Left ventricle: The cavity size was normal. Systolic function was normal. The estimated ejection fraction was in the range of 60% to 65%. Wall motion was normal; there were no regional wall motion abnormalities. Left ventricular diastolic function parameters were normal. - Mitral valve: There was trivial regurgitation. - Right atrium: The atrium was mildly dilated. - Tricuspid valve: There was trivial regurgitation.     RADIOGRAPHIC STUDIES: I have personally reviewed the radiological images as listed and agreed with the findings in the report. No results found.   ASSESSMENT & PLAN: Kathryn Stanley is a 42 y.o. premenopausal female with no significant past medical history, presented with a palpable left breast mass.  1. Malignant neoplasm of upper-outer quadrant of left breast in female, ductal carcinoma, cT2N1M0, Stage IIIa, ER weakly +,  PR - , HER2 - , Grade 3, ypT1cN0M0, triple negative  2.  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 Analysis also detecteda Variant of Uncertain Significance (VUS) in the NBN gene calledc.628G>T (p.Val210Phe). -S/P BSO with Dr. Denman George on 11/03/2017 3.  Financial support 4.  Transaminitis 5.  Toenail fungus  Ms. Casaus appears stable. She completed cycle 1 adjuvant Xeloda 2000 mg BID from days 1-14. She is on her week off. She tolerated well overall, with loose stool and mild plantar erythema. She notes topical steroids causes her skin redness to flare. She continues topical lotion. I recommend OTC imodium for frequent loose stool. Labs reviewed, CBC and CMP unremarkable. VS and weight stable. I recommend to proceed with cycle 2 at current dose. I refilled Xeloda for her  today. F/u in 3 weeks prior to cycle 3.   PLAN: -Labs reviewed -Proceed with cycle 2 Xeloda 2000 mg BID 2 weeks on/1 week off, I refilled today -Topical agents for skin  -Imodium for frequent loose BM -Lab, f/u in 3 weeks before cycle 3  All questions were answered. The patient knows to call the clinic with any problems, questions or concerns. No barriers to learning was detected.     Alla Feeling, NP 12/08/17

## 2017-12-08 ENCOUNTER — Encounter: Payer: Self-pay | Admitting: Nurse Practitioner

## 2017-12-08 ENCOUNTER — Inpatient Hospital Stay (HOSPITAL_BASED_OUTPATIENT_CLINIC_OR_DEPARTMENT_OTHER): Payer: Medicaid Other | Admitting: Nurse Practitioner

## 2017-12-08 ENCOUNTER — Telehealth: Payer: Self-pay | Admitting: Nurse Practitioner

## 2017-12-08 ENCOUNTER — Inpatient Hospital Stay: Payer: Medicaid Other | Attending: Hematology

## 2017-12-08 VITALS — BP 126/83 | HR 80 | Temp 98.2°F | Resp 18 | Ht 64.0 in | Wt 199.6 lb

## 2017-12-08 DIAGNOSIS — R740 Nonspecific elevation of levels of transaminase and lactic acid dehydrogenase [LDH]: Secondary | ICD-10-CM

## 2017-12-08 DIAGNOSIS — D696 Thrombocytopenia, unspecified: Secondary | ICD-10-CM | POA: Insufficient documentation

## 2017-12-08 DIAGNOSIS — F17210 Nicotine dependence, cigarettes, uncomplicated: Secondary | ICD-10-CM | POA: Diagnosis not present

## 2017-12-08 DIAGNOSIS — K219 Gastro-esophageal reflux disease without esophagitis: Secondary | ICD-10-CM | POA: Diagnosis not present

## 2017-12-08 DIAGNOSIS — C50412 Malignant neoplasm of upper-outer quadrant of left female breast: Secondary | ICD-10-CM | POA: Insufficient documentation

## 2017-12-08 DIAGNOSIS — Z1501 Genetic susceptibility to malignant neoplasm of breast: Secondary | ICD-10-CM | POA: Insufficient documentation

## 2017-12-08 DIAGNOSIS — Z90710 Acquired absence of both cervix and uterus: Secondary | ICD-10-CM | POA: Insufficient documentation

## 2017-12-08 DIAGNOSIS — F39 Unspecified mood [affective] disorder: Secondary | ICD-10-CM | POA: Insufficient documentation

## 2017-12-08 DIAGNOSIS — B351 Tinea unguium: Secondary | ICD-10-CM | POA: Diagnosis not present

## 2017-12-08 DIAGNOSIS — Z90722 Acquired absence of ovaries, bilateral: Secondary | ICD-10-CM | POA: Insufficient documentation

## 2017-12-08 DIAGNOSIS — R197 Diarrhea, unspecified: Secondary | ICD-10-CM | POA: Diagnosis not present

## 2017-12-08 DIAGNOSIS — K900 Celiac disease: Secondary | ICD-10-CM | POA: Diagnosis not present

## 2017-12-08 DIAGNOSIS — Z923 Personal history of irradiation: Secondary | ICD-10-CM

## 2017-12-08 DIAGNOSIS — Z170 Estrogen receptor positive status [ER+]: Secondary | ICD-10-CM

## 2017-12-08 DIAGNOSIS — Z9221 Personal history of antineoplastic chemotherapy: Secondary | ICD-10-CM | POA: Insufficient documentation

## 2017-12-08 DIAGNOSIS — K5900 Constipation, unspecified: Secondary | ICD-10-CM | POA: Diagnosis not present

## 2017-12-08 DIAGNOSIS — Z9013 Acquired absence of bilateral breasts and nipples: Secondary | ICD-10-CM

## 2017-12-08 LAB — CMP (CANCER CENTER ONLY)
ALBUMIN: 4.4 g/dL (ref 3.5–5.0)
ALT: 34 U/L (ref 0–44)
AST: 19 U/L (ref 15–41)
Alkaline Phosphatase: 85 U/L (ref 38–126)
Anion gap: 7 (ref 5–15)
BUN: 20 mg/dL (ref 6–20)
CHLORIDE: 105 mmol/L (ref 98–111)
CO2: 26 mmol/L (ref 22–32)
Calcium: 9.5 mg/dL (ref 8.9–10.3)
Creatinine: 0.83 mg/dL (ref 0.44–1.00)
GFR, Est AFR Am: >60 mL/min (ref >60)
GFR, Estimated: >60 mL/min (ref >60)
GLUCOSE: 90 mg/dL (ref 70–99)
POTASSIUM: 3.8 mmol/L (ref 3.5–5.1)
Sodium: 138 mmol/L (ref 135–145)
Total Bilirubin: 0.5 mg/dL (ref 0.3–1.2)
Total Protein: 7.2 g/dL (ref 6.5–8.1)

## 2017-12-08 LAB — CBC WITH DIFFERENTIAL (CANCER CENTER ONLY)
BASOS ABS: 0 10*3/uL (ref 0.0–0.1)
Basophils Relative: 1 %
EOS PCT: 5 %
Eosinophils Absolute: 0.2 10*3/uL (ref 0.0–0.5)
HEMATOCRIT: 34.1 % — AB (ref 34.8–46.6)
Hemoglobin: 12.1 g/dL (ref 11.6–15.9)
Lymphocytes Relative: 27 %
Lymphs Abs: 1.2 10*3/uL (ref 0.9–3.3)
MCH: 28.2 pg (ref 25.1–34.0)
MCHC: 35.5 g/dL (ref 31.5–36.0)
MCV: 79.5 fL (ref 79.5–101.0)
MONO ABS: 0.4 10*3/uL (ref 0.1–0.9)
Monocytes Relative: 10 %
NEUTROS ABS: 2.5 10*3/uL (ref 1.5–6.5)
Neutrophils Relative %: 57 %
PLATELETS: 186 10*3/uL (ref 145–400)
RBC: 4.29 MIL/uL (ref 3.70–5.45)
RDW: 13.6 % (ref 11.2–14.5)
WBC: 4.3 10*3/uL (ref 3.9–10.3)

## 2017-12-08 MED ORDER — CAPECITABINE 500 MG PO TABS: 1000.0000 mg/m2 | ORAL_TABLET | Freq: Two times a day (BID) | ORAL | 0 refills | Status: DC

## 2017-12-08 NOTE — Telephone Encounter (Signed)
Scheduled appt per 7/2 los - gave patient AVS and calender per los

## 2017-12-09 ENCOUNTER — Other Ambulatory Visit: Payer: Medicaid Other

## 2017-12-09 ENCOUNTER — Ambulatory Visit: Payer: Medicaid Other | Admitting: Nurse Practitioner

## 2017-12-09 MED FILL — CAPECITABINE 500 MG TABLET: 500 | 21 days supply | Qty: 112 | Fill #0

## 2017-12-23 ENCOUNTER — Other Ambulatory Visit: Payer: Self-pay | Admitting: Nurse Practitioner

## 2017-12-23 DIAGNOSIS — Z170 Estrogen receptor positive status [ER+]: Principal | ICD-10-CM

## 2017-12-23 DIAGNOSIS — C50412 Malignant neoplasm of upper-outer quadrant of left female breast: Secondary | ICD-10-CM

## 2017-12-24 NOTE — Progress Notes (Signed)
[place]  [phone] [phone]  Clinic Follow-up Note   Patient Care Team: Mosetta Anis, MD as PCP - Joylene Draft, MD as Consulting Physician (General Surgery) [name], MD as Consulting Physician (Hematology) [name], MD as Consulting Physician (Radiation Oncology)   Date of Service:  12/29/2017  CHIEF COMPLAINTS:  Follow-up for Malignant neoplasm of upper-outer quadrant of left breast in female, estrogen receptor positive   Oncology History   Cancer Staging Malignant neoplasm of upper-outer quadrant of left breast in female, estrogen receptor positive ([place]) Staging form: Breast, AJCC 8th Edition - Clinical stage from 12/29/2016: Stage IIIA (cT2, cN1, cM0, G3, ER: Positive, PR: Negative, HER2: Negative) - Signed by [name], MD on 01/07/2017 - Pathologic: No Stage Recommended (ypT1c, pN0, cM0, G2, ER-, PR-, HER2-) - Unsigned Cancer Staging Malignant neoplasm of upper-outer quadrant of left breast in female, estrogen receptor positive ([place]) Staging form: Breast, AJCC 8th Edition - Clinical stage from 12/29/2016: Stage IIIA (cT2, cN1, cM0, G3, ER: Positive, PR: Negative, HER2: Negative) - Signed by [name], MD on 01/07/2017 - Pathologic: No Stage Recommended (ypT1c, pN0, cM0, G2, ER-, PR-, HER2-) - Unsigned       Malignant neoplasm of upper-outer quadrant of left breast in female, estrogen receptor positive ([place])   12/25/2016 Mammogram    [place] and [facility] Diagnostic Breast Tomo Bilateral 12/25/16 IMPRESSION: 1. Suspicious mass 2.3 x 1.6 x 3.0 cm  in the 130 o'clock location of the left breast 8cm from the nipple, biopsy recommended. Adjacent simple cyst is 1.9 cm. 2. Suspicious left axillary lymph node 2.2cm for which biopsy is indicated. 3. Indeterminate group of calcifications in the upper central portion of the left breast for which biopsy is recommended.       12/29/2016 Initial Biopsy    Diagnosis 12/29/16 1. Breast, left,  needle core biopsy, stereotactic, upper inner quadrant - FIBROCYSTIC CHANGES INCLUDING APOCRINE METAPLASIA WITH CALCIFICATIONS - NO MALIGNANCY IDENTIFIED 2. Breast, left, needle core biopsy, ultrasound, 1:30 o'clock - INVASIVE MAMMARY CARCINOMA, G3 - SEE COMMENT 3. Lymph node, needle/core biopsy, ultrasound, left axillary - METASTIC CARCINOMA INVOLVING ONE LYMPH NODE (1/1) E-cadherin is positive supporting a ductal origin.       12/29/2016 Receptors her2    ER 15%+, weak staining  PR - HER2- Ki67 10%       12/29/2016 Initial Diagnosis    Malignant neoplasm of upper-outer quadrant of left breast in female, estrogen receptor positive ([place])      01/13/2017 Echocardiogram    ECHO 01/13/17 Study Conclusions - Left ventricle: The cavity size was normal. Systolic function was   normal. The estimated ejection fraction was in the range of 60%   to 65%. Wall motion was normal; there were no regional wall   motion abnormalities. Left ventricular diastolic function   parameters were normal. - Mitral valve: There was trivial regurgitation. - Right atrium: The atrium was mildly dilated. - Tricuspid valve: There was trivial regurgitation.       01/14/2017 Surgery    Port placement by Dr. Donne Hazel       01/15/2017 Imaging    Whole Body bone Scan 01/15/17 IMPRESSION: 1.  No significant abnormality identified.      01/15/2017 Imaging    CT CAP W Contrast 01/15/17 IMPRESSION: 1. Left lateral breast mass with pathologic left axillary adenopathy including a 2.0 cm left axillary lymph node. 2. Several hypodense lesions in the liver are likely benign lesions such as cysts. However,  these are technically too small to characterize by CT. Possibilities for further workup include surveillance or hepatic protocol MRI with and without contrast. 3. There is also a tiny hypodense lesion in the spleen which is technically nonspecific but statistically highly likely to be benign. 4.  Prominent stool  throughout the colon favors constipation      01/16/2017 - 06/05/2017 Chemotherapy    Neoadjuvant chemo AC every 2 weeks for 4 cycles starting on 01/16/2017 and ended 02/27/17. Then proceed with weekly taxol for 12 treatments starting 03/13/17. Added Carboplatin AUC 2 on 04/03/17 to weekly taxol. Due to symptomatic thrombocytopenia we held Carbo since cycle 8. Cycle 9 was post-poned due to neutropenia and Granix was added starting 05/12/17. Added carbo back at reduced dose AUC 1.5 with cycle 10 on 05/22/17. 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   Analysis also detected a Variant of Uncertain Significance (VUS) in the NBN gene called c.628G>T (p.Val210Phe)      06/12/2017 Imaging    MR BREAST BILATERAL WO CONTRAST IMPRESSION: 1. Significant positive response to neoadjuvant chemotherapy. The index carcinoma in the left breast has significantly decreased in size. Left axillary adenopathy has also significantly improved. 2. No other evidence of malignancy.      07/16/2017 Surgery    Pt underwent a left mastectomy with Dr. Donne Hazel      07/16/2017 Pathology Results    Diagnosis 07/16/17 1. Breast, simple mastectomy, Right Prophylactic - FIBROCYSTIC CHANGES INCLUDING APOCRINE METAPLASIA - DUCT ECTASIA - CALCIFICATIONS - NO MALIGNANCY IDENTIFIED 2. Lymph node, sentinel, biopsy, Left Axillary - NO CARCINOMA IDENTIFIED IN ONE LYMPH NODE (0/1) - SEE COMMENT 3. Lymph node, sentinel, biopsy, Left - NO CARCINOMA IDENTIFIED IN ONE LYMPH NODE (0/1) - SEE COMMENT 4. Breast, simple mastectomy, Left - INVASIVE DUCTAL CARCINOMA, NOTTINGHAM GRADE 2 OF 3, 1.3 CM - MARGINS UNINVOLVED BY CARCINOMA (2 CM POSTERIOR MARGIN) - PREVIOUS BIOPSY SITE CHANGES - CALCIFICATIONS - SEE ONCOLOGY TABLE BELOW      07/16/2017 Receptors her2    Results: IMMUNOHISTOCHEMICAL AND  MORPHOMETRIC ANALYSIS PERFORMED MANUALLY Estrogen Receptor: 0%, NEGATIVE Progesterone Receptor: 0%, NEGATIVE Results: HER2 - NEGATIVE RATIO OF HER2/CEP17 SIGNALS 1.73 AVERAGE HER2 COPY NUMBER PER CELL 2.67      09/02/2017 - 10/16/2017 Radiation Therapy    Adjuvant radiation per Dr. Lisbeth Haskell       11/03/2017 Surgery    XI ROBOTIC ASSISTED TOTAL HYSTERECTOMY WITH BILATERAL SALPINGO OOPHORECTOMY  by Dr. Denman George and Dr. Delsa Sale 11/03/17       Chemotherapy    PENDING Xeloda 2073m BID 2 weeks on and 1 week off starting on 11/23/17 for 2-3 months        HISTORY OF PRESENTING ILLNESS: 01/07/17 Kathryn AlbertsRenshaw 42y.o. female is here because of newly diagnosed Malignant neoplasm of upper-outer quadrant of left breast in female, estrogen receptor positive. She presents to the breast clinic today with her common law husband. She felt the lump herself 3 months ago. She felt pain like "stabbing" first. She then had a mammogram 12/25/16 and subsequent biopsy   In the past she was using smoking match and quit smoking "cold [place] that was 1 month ago.   Today she reports her breast pain still comes and goes. She has not noticed any new changes. She recently has seen PCP because of lump and does not  have a Editor, commissioning so she had not gotten a mammogram before. Lately has regular period. She has a weak cervix and her children were born premature. She is currently working at [employer] and works on her feet all the time, working 5 days a week.   GYN HISTORY  Menarchal: 10 LMP: 12/10/16 Contraceptive: No HRT: NA GP: G1P2 - twins    CURRENT THERAPY:  Xeloda 2098m BID 2 weeks on and 1 week off starting on 11/23/17 for 3 months   INTERVAL HISTORY:  JLILJA SOLANDis here for a follow-up. She is here with her husband. She complain of diarrhea and very bad mood swings. She would like to have a medication to help with her mood swings, but is worried about side effects. She used imodium for diarrhea.  She is still not sure about when her implant appointment with Dr. WDonne Hazelis, and weather or not it will interfere with the clinical trial.     MEDICAL HISTORY:  Past Medical History:  Diagnosis Date  . Breast cancer, left breast ([place] dx'd 01/2017  . Complication of anesthesia   . Dermatologic problem    possible psoriasis. Pt has not had been diagnosed by dermatology.  . 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 GERD (gastroesophageal reflux disease)   . PONV (postoperative nausea and vomiting)   . Tobacco dependence    trying to quit with nicotene patches    SURGICAL HISTORY: Past Surgical History:  Procedure Laterality Date  . BREAST BIOPSY Left 12/2016  . BREAST RECONSTRUCTION Bilateral 07/16/2017   BILATERAL BREAST RECONSTRUCTION WITH PLACEMENT OF TISSUE EXPANDER AND ALLODERM/notes 07/16/2017  . CESAREAN SECTION  2002  . MASTECTOMY Bilateral 07/16/2017   LEFT SKIN SPARING MASTECTOMY WITH LEFT RADIOACTIVE SEED TARGETED AXILLARY LYMPH NODE EXCISION AND AXILLARY SENTINEL LYMPH NODE BIOPSY; RIGHT PROPHYLACTIC SKIN SPARING MASTECTOMY /[name]2/12/2017  . MASTECTOMY WITH RADIOACTIVE SEED GUIDED EXCISION AND AXILLARY SENTINEL LYMPH NODE BIOPSY Left 07/16/2017   Procedure: LEFT SKIN SPARING MASTECTOMY WITH LEFT RADIOACTIVE SEED TARGETED AXILLARY LYMPH NODE EXCISION AND AXILLARY SENTINEL LYMPH NODE BIOPSY;  Surgeon: WRolm Bookbinder MD;  [facility]  Service: General;  Laterality: Left;  . NIPPLE SPARING MASTECTOMY Right 07/16/2017   Procedure: RIGHT PROPHYLACTIC SKIN SPARING MASTECTOMY;  Surgeon: WRolm Bookbinder MD;  [facility]  Service: General;  Laterality: Right;  . PORT-A-CATH REMOVAL N/A 07/16/2017   Procedure: REMOVAL PORT-A-CATH;  Surgeon: WRolm Bookbinder MD;  [facility]  Service: General;  Laterality: N/A;  . PORTA CATH REMOVAL  07/16/2017  . PORTACATH PLACEMENT N/A 01/14/2017   Procedure:  INSERTION PORT-A-CATH WITH [place]  Surgeon: WRolm Bookbinder MD;  [facility]  Service: General;  Laterality: N/A;  . ROBOTIC ASSISTED TOTAL HYSTERECTOMY WITH BILATERAL SALPINGO OOPHERECTOMY Bilateral 11/03/2017   Procedure: XI ROBOTIC ASSISTED TOTAL HYSTERECTOMY WITH BILATERAL SALPINGO OOPHORECTOMY;  Surgeon: REveritt Amber MD;  Location: [facility];  Service: Gynecology;  Laterality: Bilateral;    SOCIAL HISTORY: Social History   Socioeconomic History  . Marital status: Married    Spouse name: Not on file  . Number of children: Not on file  . Years of education: Not on file  . Highest education level: Not on file  Occupational History  . Not on file  Social Needs  . Financial resource strain: Not on file  . Food insecurity:    Worry: Not on file    Inability: Not on file  .  Transportation needs:    Medical: Not on file    Non-medical: Not on file  Tobacco Use  . Smoking status: Former Smoker    Packs/day: 1.00    Years: 17.00    Pack years: 17.00    Types: Cigarettes    Last attempt to quit: 12/15/2016    Years since quitting: 1.0  . Smokeless tobacco: Never Used  Substance and Sexual Activity  . Alcohol use: No  . Drug use: No  . Sexual activity: Yes    Birth control/protection: None  Lifestyle  . Physical activity:    Days per week: Not on file    Minutes per session: Not on file  . Stress: Not on file  Relationships  . Social connections:    Talks on phone: Not on file    Gets together: Not on file    Attends religious service: Not on file    Active member of club or organization: Not on file    Attends meetings of clubs or organizations: Not on file    Relationship status: Not on file  . Intimate partner violence:    Fear of current or ex partner: Not on file    Emotionally abused: Not on file    Physically abused: Not on file    Forced sexual activity: Not on file  Other Topics Concern  . Not on file  Social History Narrative  . Not on file    FAMILY  HISTORY: Family History  Problem Relation Age of Onset  . Stroke Maternal Grandmother   . Stroke Father   . Hypertension Father   . Other Father        Adopted    ALLERGIES:  is allergic to amoxicillin; other; chlorhexidine gluconate; and nicotine.  MEDICATIONS:  Current Outpatient Medications  Medication Sig Dispense Refill  . capecitabine (XELODA) 500 MG tablet TAKE 4 TABLETS (2,000 MG TOTAL) BY MOUTH 2 (TWO) TIMES DAILY AFTER A MEAL. TAKE FOR 14 DAYS ON, 7 DAYS OFF, REPEAT EVERY 21 DAYS. 112 tablet 0  . famotidine (PEPCID) 10 MG tablet Take 10 mg by mouth daily as needed for heartburn or indigestion.    Marland Kitchen ibuprofen (ADVIL,MOTRIN) 200 MG tablet Take 200-400 mg by mouth daily as needed for headache or moderate pain.    . Multiple Vitamin (MULTIVITAMIN WITH MINERALS) TABS tablet Take 1 tablet by mouth daily at 12 noon.    . citalopram (CELEXA) 10 MG tablet Take 1 tablet (10 mg total) by mouth daily. 30 tablet 1   No current facility-administered medications for this visit.    REVIEW OF SYSTEMS:  Constitutional: Denies abnormal night sweats. (+) severe mood swings Eyes: Denies blurriness of vision, double vision or watery eyes Ears, nose, mouth, throat, and face: Denies mucositis or sore throat Respiratory: Denies dyspnea or wheezes   Cardiovascular: Denies palpitation, chest discomfort  Gastrointestinal:  Denies nausea, heartburn. (+) diarrhea Skin: Denies abnormal skin rashes  Lymphatics: Denies new lymphadenopathy or easy bruising Neurological:Denies numbness, tingling or new weaknesses MSK: negative Behavioral/Psych: Mood is stable, no new changes  All other systems were reviewed with the patient and are negative.  PHYSICAL EXAMINATION:  ECOG PERFORMANCE STATUS: 1  Vitals:   12/29/17 1420  BP: 126/80  Pulse: 86  Resp: 18  Temp: 98.1 F (36.7 C)  SpO2: 100%   Filed Weights   12/29/17 1420  Weight: 198 lb 14.4 oz (90.2 kg)     GENERAL:alert, no distress and  comfortable SKIN: skin color,  texture, turgor are normal, no rashes or significant lesions EYES: normal, conjunctiva are pink and non-injected, sclera clear OROPHARYNX:no exudate, no erythema and lips, buccal mucosa, and tongue normal  NECK: supple, thyroid normal size, non-tender, without nodularity LYMPH:  no palpable lymphadenopathy in the cervical, axillary or inguinal LUNGS: clear to auscultation and percussion with normal breathing effort HEART: regular rate & rhythm and no murmurs and no lower extremity edema ABDOMEN:abdomen soft, non-tender and normal bowel sounds (+) S/p BSO and hysterectomy: 3 abdominal surgical incisions healed well.  Musculoskeletal:no cyanosis of digits and no clubbing  PSYCH: alert & oriented x 3 with fluent speech NEURO: no focal motor/sensory deficits Breast: Breast exam deferred today (+) she is healing properly   LABORATORY DATA:  I have reviewed the data as listed CBC Latest Ref Rng & Units 12/29/2017 12/08/2017 11/16/2017  WBC 3.9 - 10.3 K/uL 4.3 4.3 5.0  Hemoglobin 11.6 - 15.9 g/dL 11.5(L) 12.1 13.5  Hematocrit 34.8 - 46.6 % 32.7(L) 34.1(L) 39.6  Platelets 145 - 400 K/uL 187 186 217    CMP Latest Ref Rng & Units 12/29/2017 12/08/2017 11/16/2017  Glucose 70 - 99 mg/dL 91 90 108  BUN 6 - 20 mg/dL _0 Creatinine 0.44 - 1.00 mg/dL 0.80 0.83 0.83  Sodium 135 - 145 mmol/L 143 138 140  Potassium 3.5 - 5.1 mmol/L 3.7 3.8 3.8  Chloride 98 - 111 mmol/L 109 105 105  CO2 22 - 32 mmol/L _1 Calcium 8.9 - 10.3 mg/dL 9.5 9.5 9.6  Total Protein 6.5 - 8.1 g/dL 7.3 7.2 7.5  Total Bilirubin 0.3 - 1.2 mg/dL 0.5 0.5 0.3  Alkaline Phos 38 - 126 U/L 84 85 96  AST 15 - 41 U/L _2 ALT 0 - 44 U/L 45(H) 34 46     PATHOLOGY  Diagnosis 11/03/17 Uterus, cervix and bilateral fallopian tubes, with ovaries - BENIGN PROLIFERATIVE ENDOMETRIUM. - BENIGN UNREMARKABLE CERVIX. - BENIGN FOLLICULAR CYSTS OF BILATERAL OVARIES. - BENIGN UNREMARKABLE BILATERAL  FALLOPIAN TUBES. - NEGATIVE FOR HYPERPLASIA OR MALIGNANCY.  Cytology Diagnosis 11/03/17 PERITONEAL WASHING(SPECIMEN 1 OF 1 COLLECTED 11/03/17): REACTIVE MESOTHELIAL CELLS PRESENT.   Diagnosis 12/29/16 1. Breast, left, needle core biopsy, stereotactic, upper inner quadrant - FIBROCYSTIC CHANGES INCLUDING APOCRINE METAPLASIA WITH CALCIFICATIONS - NO MALIGNANCY IDENTIFIED 2. Breast, left, needle core biopsy, ultrasound, 1:30 o'clock - INVASIVE MAMMARY CARCINOMA - SEE COMMENT 3. Lymph node, needle/core biopsy, ultrasound, left axillary - METASTIC CARCINOMA INVOLVING ONE LYMPH NODE (1/1) Microscopic Comment 2. The biopsy material shows an infiltrative proliferation of cells with large vesicular nuclei with conspicuous nucleoli, arranged linearly and in small clusters. There are scattered cells with marked pleomorphism and cytologic atypia. Based on the biopsy, the carcinoma appears Nottingham grade 3 of 3 and measures 0.9 cm in greatest linear extent. E-cadherin and prognostic markers (ER/PR/ki-67/HER2-FISH)are pending and will be reported in an addendum. Dr. Tresa Moore reviewed the case and agrees with the above diagnosis. This case was called to [facility] on December 30, 2016. Results: IMMUNOHISTOCHEMICAL AND MORPHOMETRIC ANALYSIS PERFORMED MANUALLY Estrogen Receptor: 15%, POSITIVE, WEAK STAINING INTENSITY Progesterone Receptor: 0%, NEGATIVE Proliferation Marker Ki67: 10% COMMENT: The negative hormone receptor study(ies) in this case has An internal positive control. Results: HER2 - NEGATIVE RATIO OF HER2/CEP17 SIGNALS 1.63 AVERAGE HER2 COPY NUMBER PER CELL 3.10  Diagnosis 07/16/17 1. Breast, simple mastectomy, Right Prophylactic - FIBROCYSTIC CHANGES INCLUDING APOCRINE METAPLASIA - DUCT ECTASIA - CALCIFICATIONS - NO MALIGNANCY IDENTIFIED 2. Lymph  node, sentinel, biopsy, Left Axillary - NO CARCINOMA IDENTIFIED IN ONE LYMPH NODE (0/1) - SEE COMMENT 3. Lymph node,  sentinel, biopsy, Left - NO CARCINOMA IDENTIFIED IN ONE LYMPH NODE (0/1) - SEE COMMENT 4. Breast, simple mastectomy, Left - INVASIVE DUCTAL CARCINOMA, NOTTINGHAM GRADE 2 OF 3, 1.3 CM - MARGINS UNINVOLVED BY CARCINOMA (2 CM POSTERIOR MARGIN) - PREVIOUS BIOPSY SITE CHANGES - CALCIFICATIONS - SEE ONCOLOGY TABLE BELOW Microscopic Comment 2. and 3. Cytokeratin AE1/3 was performed on the sentinel lymph nodes to exclude micrometastasis. There is no evidence of metastatic carcinoma by immunohistochemistry. 4. BREAST, STATUS POST NEOADJUVANT TREATMENT Procedure: Total mastectomy Laterality: Left Tumor Size: 1.3 cm (glass slide measurement) Histologic Type: Invasive carcinoma of no special type (ductal, not otherwise specified) Grade: Nottingham Grade 2 Tubular Differentiation: 3 Nuclear Pleomorphism: 3 Mitotic Count: 1 Ductal Carcinoma in Situ (DCIS): Not identified Regional Lymph Nodes: Number of Lymph Nodes Examined: 2 Number of Sentinel Lymph Nodes Examined: 2 Lymph Nodes with Macrometastases: 0 Lymph Nodes with Micrometastases: 0 2 of 5 FINAL for Kathryn Stanley, Kathryn Stanley ([id number]) Microscopic Comment(continued) Lymph Nodes with Isolated Tumor Cells: 0 Margins: Uninvolved by invasive carcinoma Invasive carcinoma, distance from closest margin: 2 cm (posterior/deep) DCIS, distance from closest margin: N/A Breast Prognostic Profile (pre-neoadjuvant case #: [id number]) Estrogen Receptor: Positive (15%, weak) Progesterone Receptor: Negative Her2: Negative (Ratio 1.63) Ki-67: 10% Will be repeated on the current case (Block #: [id number]) and the results reported separately. Residual Cancer Burden (RCB): Primary Tumor Bed: 13 mm x 10 mm Overall Cancer Cellularity: 20% Percentage of Cancer that is in Situ: N/A Number of Positive Lymph Nodes: 0 Diameter of Largest Lymph Node metastasis: N/A Residual Cancer Burden : 1.611 Residual Cancer Burden Class: RCB-II Pathologic Stage Classification (p  TNM, AJCC 8th Edition): Primary Tumor: ypT1c Regional Lymph Nodes: ypN0 COMMENT: E-cadherin immunohistochemistry was performed on the biopsy material and was positive supporting a ductal origin. Results: IMMUNOHISTOCHEMICAL AND MORPHOMETRIC ANALYSIS PERFORMED MANUALLY Estrogen Receptor: 0%, NEGATIVE Progesterone Receptor: 0%, NEGATIVE Results: HER2 - NEGATIVE RATIO OF HER2/CEP17 SIGNALS 1.73 AVERAGE HER2 COPY NUMBER PER CELL 2.67  PROCEDURES  ECHO 01/13/17 Study Conclusions - Left ventricle: The cavity size was normal. Systolic function was   normal. The estimated ejection fraction was in the range of 60%   to 65%. Wall motion was normal; there were no regional wall   motion abnormalities. Left ventricular diastolic function   parameters were normal. - Mitral valve: There was trivial regurgitation. - Right atrium: The atrium was mildly dilated. - Tricuspid valve: There was trivial regurgitation.  RADIOGRAPHIC STUDIES: I have personally reviewed the radiological images as listed and agreed with the findings in the report. No results found.   08/03/2017 US Pelvis IMPRESSION: No focal sonographic abnormalities identified.  ASSESSMENT & PLAN:  Kathryn Stanley is a 42 y.o. premenopausal female with no significant past medical history, presented with a palpable left breast mass.  1. Malignant neoplasm of upper-outer quadrant of left breast in female, ductal carcinoma, cT2N1M0, Stage IIIa, ER weakly +, PR - , HER2 - , Grade 3, ypT1cN0M0, triple negative  -We previously reviewed her mammogram and initial biopsy results in details with patient and her husband.  -Her tumor is weakly ER positive, PR negative, HER-2 negative, grade 3, she has positive lymph nodes, those are all high risks for recurrence. I recommend neoadjuvant or adjuvant chemotherapy to reduce her risk of recurrence after surgery. -we reviewed her staging scan CT from 01/15/17 in person. A few hypodense lesion  were seen  in the liver, appear to be benign cysts. CT and bone scan otherwise negative for distant metastasis. 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 This was discussed with patient. -Patient underwent neoadjuvant chemotherapy with dose dense AC every 2 weeks for 4 cycles, and weekly carbo and Taxol for 12 weeks -Pt underwent a bilateral mastectomy with Dr. Donne Hazel on 07/16/17. Her pathology results reveal a 1.3 tumor removed with all clean margins. There were no positive lymph nodes for carcinoma. Her receptors indicate triple negative. -her tumor was weakly ER positive on initial biopsy, and became triple negative after neoadjuvant chemotherapy. I think the benefit of antiestrogen therapy is very small and I do not recommend it.   -She underwent radiation with Dr. Lisbeth Greenup 09/02/17-10/16/17. Recovered well.  -She completed total hysterectomy and BSO on 11/03/17, pathology was benign. She has recovered well mostly.   -Patient to follow up with her plastic surgeon regarding reconstruction surgery in August, 2019.  -due to her weekly ER on initial biopsy, and triple negative disease on her final surgical sample, I did not think she benefits much from adjuvant antiestrogen therapy. -Due to her triple negative disease, residual 1.3cm tumor and its agressiveness nature, I recommend adjuvant chemo Xeloda for 3 months. She has started and been tolerating well so far  -I encouraged her to consider clinical trial for immunotherapy Keytruda in triple negative cancer. I discussed side effects and benefits. She is interested. After the patient completes Xeloda, she will be screened for adjuvant Keytruda clinical trial.  -Lab reviewed, she has developed mild anemia, secondary to Xeloda.  Adequate full treatment, she will start the cycle 3 Xeloda next Monday -F/u every 3 weeks with labs -Her reconstructive surgery is pending, she may have port replacement with the surgery if she is randomized to Keytruda arm     2. Genetics: BRCA1 + -Due to her young age of diagnosis I suggested she get genetic testing. 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  -She underwent mastectomy with Dr. Donne Hazel on 07/16/17 and will have  Reconstruction with plastic surgeon Dr. Ashley Mariner.  -She underwent total hysterectomy and BSO with Dr. Denman George on 11/03/17.  -I previously explained when her children get older they need to get tested and start cancer screenings earlier.   3. Financial Support -They are not insured and are attempting to get an orange card.  -They have met our financial support/social worker  4. 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 -I recommend her to try Celexa, starting energy low-dose 10 mg daily.  Potential benefit and side effects reviewed with her, she agrees to try.  I called in today.   PLAN:  -Lab reviewed, adequate for treatment.  She will start cycle 3 through the next Monday 7/29 -I prescribed citalopram today for her mood swing, will adjust her dose if needed  -Labs and f/u in 3 weeks before last cycle 4 Xeloda    All questions were answered. The patient knows to call the clinic with any problems, questions or concerns. I spent 20 minutes counseling the patient face to face. The total time spent in the appointment was 25 minutes and more than 50% was on counseling.  Dierdre Searles Dweik am acting as scribe for Dr. [name].  I have reviewed the above documentation for accuracy and  completeness, and I agree with the above.    [name]  12/29/2017

## 2017-12-28 ENCOUNTER — Encounter: Payer: Self-pay | Admitting: *Deleted

## 2017-12-28 MED FILL — CAPECITABINE 500 MG TABLET: 500 | 21 days supply | Qty: 112 | Fill #0

## 2017-12-29 ENCOUNTER — Inpatient Hospital Stay: Payer: Medicaid Other

## 2017-12-29 ENCOUNTER — Telehealth: Payer: Self-pay | Admitting: Hematology

## 2017-12-29 ENCOUNTER — Inpatient Hospital Stay (HOSPITAL_BASED_OUTPATIENT_CLINIC_OR_DEPARTMENT_OTHER): Payer: Medicaid Other | Admitting: Hematology

## 2017-12-29 ENCOUNTER — Encounter: Payer: Self-pay | Admitting: Hematology

## 2017-12-29 VITALS — BP 126/80 | HR 86 | Temp 98.1°F | Resp 18 | Ht 64.0 in | Wt 198.9 lb

## 2017-12-29 DIAGNOSIS — F17210 Nicotine dependence, cigarettes, uncomplicated: Secondary | ICD-10-CM

## 2017-12-29 DIAGNOSIS — R740 Nonspecific elevation of levels of transaminase and lactic acid dehydrogenase [LDH]: Secondary | ICD-10-CM

## 2017-12-29 DIAGNOSIS — D696 Thrombocytopenia, unspecified: Secondary | ICD-10-CM

## 2017-12-29 DIAGNOSIS — R197 Diarrhea, unspecified: Secondary | ICD-10-CM

## 2017-12-29 DIAGNOSIS — C50412 Malignant neoplasm of upper-outer quadrant of left female breast: Secondary | ICD-10-CM

## 2017-12-29 DIAGNOSIS — Z1501 Genetic susceptibility to malignant neoplasm of breast: Secondary | ICD-10-CM

## 2017-12-29 DIAGNOSIS — Z170 Estrogen receptor positive status [ER+]: Principal | ICD-10-CM

## 2017-12-29 DIAGNOSIS — K5900 Constipation, unspecified: Secondary | ICD-10-CM

## 2017-12-29 DIAGNOSIS — K219 Gastro-esophageal reflux disease without esophagitis: Secondary | ICD-10-CM

## 2017-12-29 DIAGNOSIS — Z923 Personal history of irradiation: Secondary | ICD-10-CM

## 2017-12-29 DIAGNOSIS — Z9221 Personal history of antineoplastic chemotherapy: Secondary | ICD-10-CM

## 2017-12-29 DIAGNOSIS — F39 Unspecified mood [affective] disorder: Secondary | ICD-10-CM

## 2017-12-29 DIAGNOSIS — Z90722 Acquired absence of ovaries, bilateral: Secondary | ICD-10-CM

## 2017-12-29 DIAGNOSIS — Z9013 Acquired absence of bilateral breasts and nipples: Secondary | ICD-10-CM

## 2017-12-29 DIAGNOSIS — Z90710 Acquired absence of both cervix and uterus: Secondary | ICD-10-CM

## 2017-12-29 DIAGNOSIS — Z1509 Genetic susceptibility to other malignant neoplasm: Secondary | ICD-10-CM

## 2017-12-29 DIAGNOSIS — B351 Tinea unguium: Secondary | ICD-10-CM

## 2017-12-29 LAB — CBC WITH DIFFERENTIAL (CANCER CENTER ONLY)
BASOS ABS: 0 10*3/uL (ref 0.0–0.1)
Basophils Relative: 0 %
EOS ABS: 0.2 10*3/uL (ref 0.0–0.5)
EOS PCT: 4 %
HCT: 32.7 % — ABNORMAL LOW (ref 34.8–46.6)
Hemoglobin: 11.5 g/dL — ABNORMAL LOW (ref 11.6–15.9)
Lymphocytes Relative: 23 %
Lymphs Abs: 1 10*3/uL (ref 0.9–3.3)
MCH: 29 pg (ref 25.1–34.0)
MCHC: 35.2 g/dL (ref 31.5–36.0)
MCV: 82.6 fL (ref 79.5–101.0)
MONO ABS: 0.3 10*3/uL (ref 0.1–0.9)
MONOS PCT: 7 %
Neutro Abs: 2.9 10*3/uL (ref 1.5–6.5)
Neutrophils Relative %: 66 %
PLATELETS: 187 10*3/uL (ref 145–400)
RBC: 3.96 MIL/uL (ref 3.70–5.45)
RDW: 15.1 % — AB (ref 11.2–14.5)
WBC Count: 4.3 10*3/uL (ref 3.9–10.3)

## 2017-12-29 LAB — CMP (CANCER CENTER ONLY)
ALT: 45 U/L — ABNORMAL HIGH (ref 0–44)
ANION GAP: 9 (ref 5–15)
AST: 28 U/L (ref 15–41)
Albumin: 4.2 g/dL (ref 3.5–5.0)
Alkaline Phosphatase: 84 U/L (ref 38–126)
BUN: 19 mg/dL (ref 6–20)
CHLORIDE: 109 mmol/L (ref 98–111)
CO2: 25 mmol/L (ref 22–32)
CREATININE: 0.8 mg/dL (ref 0.44–1.00)
Calcium: 9.5 mg/dL (ref 8.9–10.3)
GFR, Est AFR Am: >60 mL/min (ref >60)
GFR, Estimated: >60 mL/min (ref >60)
GLUCOSE: 91 mg/dL (ref 70–99)
POTASSIUM: 3.7 mmol/L (ref 3.5–5.1)
Sodium: 143 mmol/L (ref 135–145)
TOTAL PROTEIN: 7.3 g/dL (ref 6.5–8.1)
Total Bilirubin: 0.5 mg/dL (ref 0.3–1.2)

## 2017-12-29 MED ORDER — CITALOPRAM HYDROBROMIDE 10 MG PO TABS: 10.0000 mg | ORAL_TABLET | Freq: Every day | ORAL | 1 refills | Status: DC

## 2017-12-29 MED FILL — CITALOPRAM HBR 10 MG TABLET: 10 | 30 days supply | Qty: 30 | Fill #0

## 2017-12-29 NOTE — Telephone Encounter (Signed)
Scheduled appt per 7/23 los - pt is aware - no print out wanted per patient request.

## 2018-01-14 ENCOUNTER — Other Ambulatory Visit: Payer: Self-pay | Admitting: Hematology

## 2018-01-14 DIAGNOSIS — Z170 Estrogen receptor positive status [ER+]: Principal | ICD-10-CM

## 2018-01-14 DIAGNOSIS — C50412 Malignant neoplasm of upper-outer quadrant of left female breast: Secondary | ICD-10-CM

## 2018-01-18 NOTE — Progress Notes (Signed)
[place]  [phone]) [phone] Fax:(336) [phone]  Clinic Follow-up Note   Patient Care Team: Mosetta Anis, MD as PCP - Joylene Draft, MD as Consulting Physician (General Surgery) [name], MD as Consulting Physician (Hematology) [name], MD as Consulting Physician (Radiation Oncology)   Date of Service:  01/19/2018  CHIEF COMPLAINTS:  Follow-up for Malignant neoplasm of upper-outer quadrant of left breast in female, estrogen receptor positive   Oncology History   Cancer Staging Malignant neoplasm of upper-outer quadrant of left breast in female, estrogen receptor positive ([place]) Staging form: Breast, AJCC 8th Edition - Clinical stage from 12/29/2016: Stage IIIA (cT2, cN1, cM0, G3, ER: Positive, PR: Negative, HER2: Negative) - Signed by [name], MD on 01/07/2017 - Pathologic: No Stage Recommended (ypT1c, pN0, cM0, G2, ER-, PR-, HER2-) - Unsigned Cancer Staging Malignant neoplasm of upper-outer quadrant of left breast in female, estrogen receptor positive ([place]) Staging form: Breast, AJCC 8th Edition - Clinical stage from 12/29/2016: Stage IIIA (cT2, cN1, cM0, G3, ER: Positive, PR: Negative, HER2: Negative) - Signed by [name], MD on 01/07/2017 - Pathologic: No Stage Recommended (ypT1c, pN0, cM0, G2, ER-, PR-, HER2-) - Unsigned       Malignant neoplasm of upper-outer quadrant of left breast in female, estrogen receptor positive ([place])   12/25/2016 Mammogram    [place] and [facility] Diagnostic Breast Tomo Bilateral 12/25/16 IMPRESSION: 1. Suspicious mass 2.3 x 1.6 x 3.0 cm  in the 130 o'clock location of the left breast 8cm from the nipple, biopsy recommended. Adjacent simple cyst is 1.9 cm. 2. Suspicious left axillary lymph node 2.2cm for which biopsy is indicated. 3. Indeterminate group of calcifications in the upper central portion of the left breast for which biopsy is recommended.     12/29/2016 Initial Biopsy    Diagnosis 12/29/16 1. Breast, left,  needle core biopsy, stereotactic, upper inner quadrant - FIBROCYSTIC CHANGES INCLUDING APOCRINE METAPLASIA WITH CALCIFICATIONS - NO MALIGNANCY IDENTIFIED 2. Breast, left, needle core biopsy, ultrasound, 1:30 o'clock - INVASIVE MAMMARY CARCINOMA, G3 - SEE COMMENT 3. Lymph node, needle/core biopsy, ultrasound, left axillary - METASTIC CARCINOMA INVOLVING ONE LYMPH NODE (1/1) E-cadherin is positive supporting a ductal origin.     12/29/2016 Receptors her2    ER 15%+, weak staining  PR - HER2- Ki67 10%     12/29/2016 Initial Diagnosis    Malignant neoplasm of upper-outer quadrant of left breast in female, estrogen receptor positive ([place])    01/13/2017 Echocardiogram    ECHO 01/13/17 Study Conclusions - Left ventricle: The cavity size was normal. Systolic function was   normal. The estimated ejection fraction was in the range of 60%   to 65%. Wall motion was normal; there were no regional wall   motion abnormalities. Left ventricular diastolic function   parameters were normal. - Mitral valve: There was trivial regurgitation. - Right atrium: The atrium was mildly dilated. - Tricuspid valve: There was trivial regurgitation.     01/14/2017 Surgery    Port placement by Dr. Donne Hazel     01/15/2017 Imaging    Whole Body bone Scan 01/15/17 IMPRESSION: 1.  No significant abnormality identified.    01/15/2017 Imaging    CT CAP W Contrast 01/15/17 IMPRESSION: 1. Left lateral breast mass with pathologic left axillary adenopathy including a 2.0 cm left axillary lymph node. 2. Several hypodense lesions in the liver are likely benign lesions such as cysts. However, these are technically too small to characterize by CT. Possibilities for further workup include  surveillance or hepatic protocol MRI with and without contrast. 3. There is also a tiny hypodense lesion in the spleen which is technically nonspecific but statistically highly likely to be benign. 4.  Prominent stool throughout the colon  favors constipation    01/16/2017 - 06/05/2017 Chemotherapy    Neoadjuvant chemo AC every 2 weeks for 4 cycles starting on 01/16/2017 and ended 02/27/17. Then proceed with weekly taxol for 12 treatments starting 03/13/17. Added Carboplatin AUC 2 on 04/03/17 to weekly taxol. Due to symptomatic thrombocytopenia we held Carbo since cycle 8. Cycle 9 was post-poned due to neutropenia and Granix was added starting 05/12/17. Added carbo back at reduced dose AUC 1.5 with cycle 10 on 05/22/17. 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   Analysis also detected a Variant of Uncertain Significance (VUS) in the NBN gene called c.628G>T (p.Val210Phe)    06/12/2017 Imaging    MR BREAST BILATERAL WO CONTRAST IMPRESSION: 1. Significant positive response to neoadjuvant chemotherapy. The index carcinoma in the left breast has significantly decreased in size. Left axillary adenopathy has also significantly improved. 2. No other evidence of malignancy.    07/16/2017 Surgery    Pt underwent a left mastectomy with Dr. Donne Hazel    07/16/2017 Pathology Results    Diagnosis 07/16/17 1. Breast, simple mastectomy, Right Prophylactic - FIBROCYSTIC CHANGES INCLUDING APOCRINE METAPLASIA - DUCT ECTASIA - CALCIFICATIONS - NO MALIGNANCY IDENTIFIED 2. Lymph node, sentinel, biopsy, Left Axillary - NO CARCINOMA IDENTIFIED IN ONE LYMPH NODE (0/1) - SEE COMMENT 3. Lymph node, sentinel, biopsy, Left - NO CARCINOMA IDENTIFIED IN ONE LYMPH NODE (0/1) - SEE COMMENT 4. Breast, simple mastectomy, Left - INVASIVE DUCTAL CARCINOMA, NOTTINGHAM GRADE 2 OF 3, 1.3 CM - MARGINS UNINVOLVED BY CARCINOMA (2 CM POSTERIOR MARGIN) - PREVIOUS BIOPSY SITE CHANGES - CALCIFICATIONS - SEE ONCOLOGY TABLE BELOW    07/16/2017 Receptors her2    Results: IMMUNOHISTOCHEMICAL AND MORPHOMETRIC ANALYSIS PERFORMED  MANUALLY Estrogen Receptor: 0%, NEGATIVE Progesterone Receptor: 0%, NEGATIVE Results: HER2 - NEGATIVE RATIO OF HER2/CEP17 SIGNALS 1.73 AVERAGE HER2 COPY NUMBER PER CELL 2.67    09/02/2017 - 10/16/2017 Radiation Therapy    Adjuvant radiation per Dr. Lisbeth Bergin     11/03/2017 Surgery    XI ROBOTIC ASSISTED TOTAL HYSTERECTOMY WITH BILATERAL SALPINGO OOPHORECTOMY  by Dr. Denman George and Dr. Delsa Sale 11/03/17    11/23/2017 -  Chemotherapy    Xeloda 2067m BID 2 weeks on and 1 week off starting on 11/23/17 for 2-3 months      HISTORY OF PRESENTING ILLNESS: 01/07/17 Kathryn Stanley 42y.o. female is here because of newly diagnosed Malignant neoplasm of upper-outer quadrant of left breast in female, estrogen receptor positive. She presents to the breast clinic today with her common law husband. She felt the lump herself 3 months ago. She felt pain like "stabbing" first. She then had a mammogram 12/25/16 and subsequent biopsy   In the past she was using smoking match and quit smoking "cold [place] that was 1 month ago.   Today she reports her breast pain still comes and goes. She has not noticed any new changes. She recently has seen PCP because of lump and does not have a GEditor, commissioningso she had not gotten a mammogram before. Lately has regular period. She has a weak cervix and her children were born premature. She is currently working at [employer]  works on her feet all the time, working 5 days a week.   GYN HISTORY  Menarchal: 10 LMP: 12/10/16 Contraceptive: No HRT: NA GP: G1P2 - twins    CURRENT THERAPY:  Xeloda 2063m BID 2 weeks on and 1 week off starting on 11/23/17 for 3 months   INTERVAL HISTORY:  JJEILY GUTHRIDGEis here for a follow-up of left breast cancer. She presents to the clinic today accompanied by her husband. She notes she finished her last pill cycle on 01/17/18. She notes burning and dry skin of her hands and feet with erythema. She notes her skin feels rare. It is  uncomfortable to do things with her hands. She uses lot of topical creams.  She notes leg and back cramps. She drinks at least 64 ounces of water a day. She notes her lips will turn pale and she feel some weakness and have intermittent dizziness. She notes she is eating less but adequately overall. She notes her mood swings and hot flashes improved with Celexa.    MEDICAL HISTORY:  Past Medical History:  Diagnosis Date  . Breast cancer, [place] breast [place]  . Complication of anesthesia   . Dermatologic problem    possible psoriasis. Pt has not had been diagnosed by dermatology.  . 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 GERD (gastroesophageal reflux disease)   . PONV (postoperative nausea and vomiting)   . Tobacco dependence    trying to quit with nicotene patches    SURGICAL HISTORY: Past Surgical History:  Procedure Laterality Date  . BREAST BIOPSY Left 12/2016  . BREAST RECONSTRUCTION Bilateral 07/16/2017   BILATERAL BREAST RECONSTRUCTION WITH PLACEMENT OF TISSUE EXPANDER AND ALLODERM/notes 07/16/2017  . CESAREAN SECTION  2002  . MASTECTOMY Bilateral 07/16/2017   LEFT SKIN SPARING MASTECTOMY WITH LEFT RADIOACTIVE SEED TARGETED AXILLARY LYMPH NODE EXCISION AND AXILLARY SENTINEL LYMPH NODE BIOPSY; RIGHT PROPHYLACTIC SKIN SPARING MASTECTOMY /[name]2/12/2017  . MASTECTOMY WITH RADIOACTIVE SEED GUIDED EXCISION AND AXILLARY SENTINEL LYMPH NODE BIOPSY Left 07/16/2017   Procedure: LEFT SKIN SPARING MASTECTOMY WITH LEFT RADIOACTIVE SEED TARGETED AXILLARY LYMPH NODE EXCISION AND AXILLARY SENTINEL LYMPH NODE BIOPSY;  Surgeon: WRolm Bookbinder MD;  [place] [facility]  Service: General;  Laterality: Left;  . NIPPLE SPARING MASTECTOMY Right 07/16/2017   Procedure: RIGHT PROPHYLACTIC SKIN SPARING MASTECTOMY;  Surgeon: WRolm Bookbinder MD;  [place] [facility]  Service: General;  Laterality: Right;  . PORT-A-CATH REMOVAL  N/A 07/16/2017   Procedure: REMOVAL PORT-A-CATH;  Surgeon: WRolm Bookbinder MD;  [place] [facility]  Service: General;  Laterality: N/A;  . PORTA CATH REMOVAL  07/16/2017  . PORTACATH PLACEMENT N/A 01/14/2017   Procedure: INSERTION PORT-A-CATH WITH [place]  Surgeon: WRolm Bookbinder MD;  [place] [facility]  Service: General;  Laterality: N/A;  . ROBOTIC ASSISTED TOTAL HYSTERECTOMY WITH BILATERAL SALPINGO OOPHERECTOMY Bilateral 11/03/2017   Procedure: XI ROBOTIC ASSISTED TOTAL HYSTERECTOMY WITH BILATERAL SALPINGO OOPHORECTOMY;  Surgeon: REveritt Amber MD;  Location: [facility];  Service: Gynecology;  Laterality: Bilateral;    SOCIAL HISTORY: Social History   Socioeconomic History  . Marital status: Married    Spouse name: Not on file  . Number of children: Not on file  . Years of education: Not on file  . Highest education level: Not on file  Occupational History  . Not on file  Social Needs  . Financial resource strain: Not on file  . Food insecurity:  Worry: Not on file    Inability: Not on file  . Transportation needs:    Medical: Not on file    Non-medical: Not on file  Tobacco Use  . Smoking status: Former Smoker    Packs/day: 1.00    Years: 17.00    Pack years: 17.00    Types: Cigarettes    Last attempt to quit: 12/15/2016    Years since quitting: 1.0  . Smokeless tobacco: Never Used  Substance and Sexual Activity  . Alcohol use: No  . Drug use: No  . Sexual activity: Yes    Birth control/protection: None  Lifestyle  . Physical activity:    Days per week: Not on file    Minutes per session: Not on file  . Stress: Not on file  Relationships  . Social connections:    Talks on phone: Not on file    Gets together: Not on file    Attends religious service: Not on file    Active member of club or organization: Not on file    Attends meetings of clubs or organizations: Not on file    Relationship status: Not on file  . Intimate partner violence:    Fear of current or ex  partner: Not on file    Emotionally abused: Not on file    Physically abused: Not on file    Forced sexual activity: Not on file  Other Topics Concern  . Not on file  Social History Narrative  . Not on file    FAMILY HISTORY: Family History  Problem Relation Age of Onset  . Stroke Maternal Grandmother   . Stroke Father   . Hypertension Father   . Other Father        Adopted    ALLERGIES:  is allergic to amoxicillin; other; chlorhexidine gluconate; and nicotine.  MEDICATIONS:  Current Outpatient Medications  Medication Sig Dispense Refill  . capecitabine (XELODA) 500 MG tablet TAKE 4 TABLETS (2,000 MG TOTAL) BY MOUTH 2 (TWO) TIMES DAILY AFTER A MEAL. TAKE FOR 14 DAYS ON, 7 DAYS OFF, REPEAT EVERY 21 DAYS. 112 tablet 0  . citalopram (CELEXA) 10 MG tablet Take 1 tablet (10 mg total) by mouth daily. 30 tablet 1  . famotidine (PEPCID) 10 MG tablet Take 10 mg by mouth daily as needed for heartburn or indigestion.    Marland Kitchen ibuprofen (ADVIL,MOTRIN) 200 MG tablet Take 200-400 mg by mouth daily as needed for headache or moderate pain.    . Multiple Vitamin (MULTIVITAMIN WITH MINERALS) TABS tablet Take 1 tablet by mouth daily at 12 noon.    . urea (CARMOL) 10 % cream Apply topically as needed. 71 g 0   No current facility-administered medications for this visit.    REVIEW OF SYSTEMS:  Constitutional: Denies abnormal night sweats. (+) fatigue and weakness with occasional dizziness Eyes: Denies blurriness of vision, double vision or watery eyes Ears, nose, mouth, throat, and face: Denies mucositis or sore throat Respiratory: Denies dyspnea or wheezes   Cardiovascular: Denies palpitation, chest discomfort  Gastrointestinal:  Denies nausea, heartburn.   Skin: Denies abnormal skin rashes (+) significant hand and foot syndrome with burning sensation Lymphatics: Denies new lymphadenopathy or easy bruising Neurological:Denies numbness, tingling or new weaknesses MSK: (+) b/l leg and back cramps   Behavioral/Psych: Mood is stable, no new changes  All other systems were reviewed with the patient and are negative.  PHYSICAL EXAMINATION:  ECOG PERFORMANCE STATUS: 1  Vitals:   01/19/18 1223  BP:  114/62  Pulse: 86  Resp: 16  Temp: 98.1 F (36.7 C)  SpO2: 99%   Filed Weights   01/19/18 1223  Weight: 197 lb 12.8 oz (89.7 kg)     GENERAL:alert, no distress and comfortable SKIN: skin color, texture, turgor are normal, no rashes or significant lesions except skin erythema on her palms and bottom of feet, no skin peeling or cracks  EYES: normal, conjunctiva are pink and non-injected, sclera clear OROPHARYNX:no exudate, no erythema and lips, buccal mucosa, and tongue normal  NECK: supple, thyroid normal size, non-tender, without nodularity LYMPH:  no palpable lymphadenopathy in the cervical, axillary or inguinal LUNGS: clear to auscultation and percussion with normal breathing effort HEART: regular rate & rhythm and no murmurs and no lower extremity edema ABDOMEN:abdomen soft, non-tender and normal bowel sounds (+) S/p BSO and hysterectomy: 3 abdominal surgical incisions healed well.  Musculoskeletal:no cyanosis of digits and no clubbing  PSYCH: alert & oriented x 3 with fluent speech NEURO: no focal motor/sensory deficits Breast: S/p left mastectomy: surgical incision healed well with tissue expander (+) skin hyperpigmentation from radiation    LABORATORY DATA:  I have reviewed the data as listed CBC Latest Ref Rng & Units 01/19/2018 12/29/2017 12/08/2017  WBC 3.9 - 10.3 K/uL 4.9 4.3 4.3  Hemoglobin 11.6 - 15.9 g/dL 11.0(L) 11.5(L) 12.1  Hematocrit 34.8 - 46.6 % 31.2(L) 32.7(L) 34.1(L)  Platelets 145 - 400 K/uL 236 187 186    CMP Latest Ref Rng & Units 01/19/2018 12/29/2017 12/08/2017  Glucose 70 - 99 mg/dL 97 91 90  BUN 6 - 20 mg/dL 18 19 20   Creatinine 0.44 - 1.00 mg/dL 0.86 0.80 0.83  Sodium 135 - 145 mmol/L 139 143 138  Potassium 3.5 - 5.1 mmol/L 3.8 3.7 3.8  Chloride 98 -  111 mmol/L 103 109 105  CO2 22 - 32 mmol/L 24 25 26   Calcium 8.9 - 10.3 mg/dL 9.3 9.5 9.5  Total Protein 6.5 - 8.1 g/dL 7.1 7.3 7.2  Total Bilirubin 0.3 - 1.2 mg/dL 0.9 0.5 0.5  Alkaline Phos 38 - 126 U/L 94 84 85  AST 15 - 41 U/L 25 28 19   ALT 0 - 44 U/L 37 45(H) 34     PATHOLOGY  Diagnosis 11/03/17 Uterus, cervix and bilateral fallopian tubes, with ovaries - BENIGN PROLIFERATIVE ENDOMETRIUM. - BENIGN UNREMARKABLE CERVIX. - BENIGN FOLLICULAR CYSTS OF BILATERAL OVARIES. - BENIGN UNREMARKABLE BILATERAL FALLOPIAN TUBES. - NEGATIVE FOR HYPERPLASIA OR MALIGNANCY.  Cytology Diagnosis 11/03/17 PERITONEAL WASHING(SPECIMEN 1 OF 1 COLLECTED 11/03/17): REACTIVE MESOTHELIAL CELLS PRESENT.   Diagnosis 12/29/16 1. Breast, left, needle core biopsy, stereotactic, upper inner quadrant - FIBROCYSTIC CHANGES INCLUDING APOCRINE METAPLASIA WITH CALCIFICATIONS - NO MALIGNANCY IDENTIFIED 2. Breast, left, needle core biopsy, ultrasound, 1:30 o'clock - INVASIVE MAMMARY CARCINOMA - SEE COMMENT 3. Lymph node, needle/core biopsy, ultrasound, left axillary - METASTIC CARCINOMA INVOLVING ONE LYMPH NODE (1/1) Microscopic Comment 2. The biopsy material shows an infiltrative proliferation of cells with large vesicular nuclei with conspicuous nucleoli, arranged linearly and in small clusters. There are scattered cells with marked pleomorphism and cytologic atypia. Based on the biopsy, the carcinoma appears Nottingham grade 3 of 3 and measures 0.9 cm in greatest linear extent. E-cadherin and prognostic markers (ER/PR/ki-67/HER2-FISH)are pending and will be reported in an addendum. Dr. Tresa Moore reviewed the case and agrees with the above diagnosis. This case was called to [facility] on December 30, 2016. Results: IMMUNOHISTOCHEMICAL AND MORPHOMETRIC ANALYSIS PERFORMED MANUALLY Estrogen Receptor: 15%, POSITIVE,  WEAK STAINING INTENSITY Progesterone Receptor: 0%, NEGATIVE Proliferation Marker  Ki67: 10% COMMENT: The negative hormone receptor study(ies) in this case has An internal positive control. Results: HER2 - NEGATIVE RATIO OF HER2/CEP17 SIGNALS 1.63 AVERAGE HER2 COPY NUMBER PER CELL 3.10  Diagnosis 07/16/17 1. Breast, simple mastectomy, Right Prophylactic - FIBROCYSTIC CHANGES INCLUDING APOCRINE METAPLASIA - DUCT ECTASIA - CALCIFICATIONS - NO MALIGNANCY IDENTIFIED 2. Lymph node, sentinel, biopsy, Left Axillary - NO CARCINOMA IDENTIFIED IN ONE LYMPH NODE (0/1) - SEE COMMENT 3. Lymph node, sentinel, biopsy, Left - NO CARCINOMA IDENTIFIED IN ONE LYMPH NODE (0/1) - SEE COMMENT 4. Breast, simple mastectomy, Left - INVASIVE DUCTAL CARCINOMA, NOTTINGHAM GRADE 2 OF 3, 1.3 CM - MARGINS UNINVOLVED BY CARCINOMA (2 CM POSTERIOR MARGIN) - PREVIOUS BIOPSY SITE CHANGES - CALCIFICATIONS - SEE ONCOLOGY TABLE BELOW Microscopic Comment 2. and 3. Cytokeratin AE1/3 was performed on the sentinel lymph nodes to exclude micrometastasis. There is no evidence of metastatic carcinoma by immunohistochemistry. 4. BREAST, STATUS POST NEOADJUVANT TREATMENT Procedure: Total mastectomy Laterality: Left Tumor Size: 1.3 cm (glass slide measurement) Histologic Type: Invasive carcinoma of no special type (ductal, not otherwise specified) Grade: Nottingham Grade 2 Tubular Differentiation: 3 Nuclear Pleomorphism: 3 Mitotic Count: 1 Ductal Carcinoma in Situ (DCIS): Not identified Regional Lymph Nodes: Number of Lymph Nodes Examined: 2 Number of Sentinel Lymph Nodes Examined: 2 Lymph Nodes with Macrometastases: 0 Lymph Nodes with Micrometastases: 0 2 of 5 FINAL for RECHELLE, NIEBLA ([id number]) Microscopic Comment(continued) Lymph Nodes with Isolated Tumor Cells: 0 Margins: Uninvolved by invasive carcinoma Invasive carcinoma, distance from closest margin: 2 cm (posterior/deep) DCIS, distance from closest margin: N/A Breast Prognostic Profile (pre-neoadjuvant case #: [id number]) Estrogen  Receptor: Positive (15%, weak) Progesterone Receptor: Negative Her2: Negative (Ratio 1.63) Ki-67: 10% Will be repeated on the current case (Block #: [id number]) and the results reported separately. Residual Cancer Burden (RCB): Primary Tumor Bed: 13 mm x 10 mm Overall Cancer Cellularity: 20% Percentage of Cancer that is in Situ: N/A Number of Positive Lymph Nodes: 0 Diameter of Largest Lymph Node metastasis: N/A Residual Cancer Burden : 1.611 Residual Cancer Burden Class: RCB-II Pathologic Stage Classification (p TNM, AJCC 8th Edition): Primary Tumor: ypT1c Regional Lymph Nodes: ypN0 COMMENT: E-cadherin immunohistochemistry was performed on the biopsy material and was positive supporting a ductal origin. Results: IMMUNOHISTOCHEMICAL AND MORPHOMETRIC ANALYSIS PERFORMED MANUALLY Estrogen Receptor: 0%, NEGATIVE Progesterone Receptor: 0%, NEGATIVE Results: HER2 - NEGATIVE RATIO OF HER2/CEP17 SIGNALS 1.73 AVERAGE HER2 COPY NUMBER PER CELL 2.67  PROCEDURES  ECHO 01/13/17 Study Conclusions - Left ventricle: The cavity size was normal. Systolic function was   normal. The estimated ejection fraction was in the range of 60%   to 65%. Wall motion was normal; there were no regional wall   motion abnormalities. Left ventricular diastolic function   parameters were normal. - Mitral valve: There was trivial regurgitation. - Right atrium: The atrium was mildly dilated. - Tricuspid valve: There was trivial regurgitation.  RADIOGRAPHIC STUDIES: I have personally reviewed the radiological images as listed and agreed with the findings in the report. No results found.   08/03/2017 US Pelvis IMPRESSION: No focal sonographic abnormalities identified.  ASSESSMENT & PLAN:  Kathryn Stanley is a 42 y.o. premenopausal female with no significant past medical history, presented with a palpable left breast mass.  1. Malignant neoplasm of upper-outer quadrant of left breast in female, ductal carcinoma,  cT2N1M0, Stage IIIa, ER weakly +, PR - , HER2 - , Grade 3, ypT1cN0M0, triple negative  -We  previously reviewed her mammogram and initial biopsy results in details with patient and her husband.  -Her tumor is weakly ER positive, PR negative, HER-2 negative, grade 3, she has positive lymph nodes, those are all high risks for recurrence. I recommend neoadjuvant or adjuvant chemotherapy to reduce her risk of recurrence after surgery. -We previously reviewed her staging scan CT from 01/15/17 in person. A few hypodense lesion were seen in the liver, appear to be benign cysts. CT and bone scan otherwise negative for distant metastasis. 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 This was discussed with patient. -Patient underwent neoadjuvant chemotherapy with dose dense AC every 2 weeks for 4 cycles, and weekly carbo and Taxol for 12 weeks, 01/16/17-06/05/17.  -Pt underwent a bilateral mastectomy with Dr. Donne Hazel on 07/16/17. Her pathology results reveal a 1.3 tumor removed with all clean margins. There were no positive lymph nodes for carcinoma. Her receptors indicate triple negative. -her tumor was weakly ER positive on initial biopsy, and became triple negative after neoadjuvant chemotherapy. I think the benefit of antiestrogen therapy is very small and I do not recommend it.   -She underwent radiation with Dr. Lisbeth Ehly 09/02/17-10/16/17. Recovered well.  -She completed total hysterectomy and BSO on 11/03/17, pathology was benign. She has recovered well mostly.   -Patient to follow up with her plastic surgeon regarding reconstruction surgery in August, 2019.  -due to her weak ER on initial biopsy, and triple negative disease on her final surgical sample, I did not think she benefits much from adjuvant antiestrogen therapy. -Due to her triple negative disease, residual 1.3cm tumor and its agressiveness nature, I recommend adjuvant chemo Xeloda for 3 months. She has started on 11/23/17 and tolerates  moderately well with hand-foot syndrome, muscle cramps and weakness from anemia. Will give her a few extra days to recover before starting last cycle Xeloda in 1-2 weeks. Plan to complete in early September.  -She is interested in the clinical trial for adjuvant immunotherapy Keytruda in triple negative cancer. Plans to screen her for clinical trail after she completes Xeloda.  -Labs reviewed, Hg at 11, CMP WNL.  -Her reconstructive surgery is pending, she decided not to have a port replacement if she participate the clinical trial, she would like to use her peripheral veins  -F/u in 4 weeks.    2. Genetics: BRCA1 + -Due to her young age of diagnosis I suggested she get genetic testing. 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  -She underwent mastectomy with Dr. Donne Hazel on 07/16/17 and will have  Reconstruction with plastic surgeon Dr. Ashley Mariner.  -She underwent total hysterectomy and BSO with Dr. Denman George on 11/03/17.  -I previously explained when her children get older they need to get tested and start cancer screenings earlier.   3. Financial Support -They are not insured and are attempting to get an orange card.  -They have met our financial support/social worker  4. 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 -I previously recommended her to try Celexa, starting energy low-dose 10 mg daily. Potential benefit and side effects reviewed with her, she agreed to try.  -Celexa has helped, her mood is back to normal and hot flashes improved. Will continue   5. Hand-Foot Syndrome, Leg and Back cramps -secondary to Xeloda -I will prescribed her Urea cream today (01/19/18) -I recommend  magnesium and calcium supplements and tonic water for her cramps.    PLAN:  -I prescribed Urea cream  today  -Plan to start last cycle Xeloda in between 8/20-8/26 to complete in early September.  -Lab and f/u in 3-4 weeks, will screen her for the adjuvant Keytruda trial on her next visit    All questions were answered. The patient knows to call the clinic with any problems, questions or concerns. I spent 20 minutes counseling the patient face to face. The total time spent in the appointment was 25 minutes and more than 50% was on counseling.  Oneal Deputy, am acting as scribe for [name], MD.   I have reviewed the above documentation for accuracy and completeness, and I agree with the above.    [name]  01/19/2018

## 2018-01-19 ENCOUNTER — Inpatient Hospital Stay: Payer: Medicaid Other | Attending: Hematology | Admitting: Hematology

## 2018-01-19 ENCOUNTER — Encounter: Payer: Self-pay | Admitting: Hematology

## 2018-01-19 ENCOUNTER — Inpatient Hospital Stay: Payer: Medicaid Other

## 2018-01-19 VITALS — BP 114/62 | HR 86 | Temp 98.1°F | Resp 16 | Ht 64.0 in | Wt 197.8 lb

## 2018-01-19 DIAGNOSIS — Z1501 Genetic susceptibility to malignant neoplasm of breast: Secondary | ICD-10-CM | POA: Diagnosis not present

## 2018-01-19 DIAGNOSIS — Z171 Estrogen receptor negative status [ER-]: Secondary | ICD-10-CM | POA: Diagnosis not present

## 2018-01-19 DIAGNOSIS — Z170 Estrogen receptor positive status [ER+]: Secondary | ICD-10-CM

## 2018-01-19 DIAGNOSIS — Z87891 Personal history of nicotine dependence: Secondary | ICD-10-CM

## 2018-01-19 DIAGNOSIS — N951 Menopausal and female climacteric states: Secondary | ICD-10-CM

## 2018-01-19 DIAGNOSIS — Z923 Personal history of irradiation: Secondary | ICD-10-CM | POA: Diagnosis not present

## 2018-01-19 DIAGNOSIS — K5900 Constipation, unspecified: Secondary | ICD-10-CM

## 2018-01-19 DIAGNOSIS — R531 Weakness: Secondary | ICD-10-CM

## 2018-01-19 DIAGNOSIS — Z9012 Acquired absence of left breast and nipple: Secondary | ICD-10-CM | POA: Diagnosis not present

## 2018-01-19 DIAGNOSIS — Z90722 Acquired absence of ovaries, bilateral: Secondary | ICD-10-CM

## 2018-01-19 DIAGNOSIS — Z90710 Acquired absence of both cervix and uterus: Secondary | ICD-10-CM

## 2018-01-19 DIAGNOSIS — C50412 Malignant neoplasm of upper-outer quadrant of left female breast: Secondary | ICD-10-CM | POA: Diagnosis present

## 2018-01-19 DIAGNOSIS — K219 Gastro-esophageal reflux disease without esophagitis: Secondary | ICD-10-CM | POA: Diagnosis not present

## 2018-01-19 DIAGNOSIS — C773 Secondary and unspecified malignant neoplasm of axilla and upper limb lymph nodes: Secondary | ICD-10-CM

## 2018-01-19 DIAGNOSIS — Z9221 Personal history of antineoplastic chemotherapy: Secondary | ICD-10-CM | POA: Diagnosis not present

## 2018-01-19 DIAGNOSIS — R252 Cramp and spasm: Secondary | ICD-10-CM

## 2018-01-19 DIAGNOSIS — Z79899 Other long term (current) drug therapy: Secondary | ICD-10-CM | POA: Diagnosis not present

## 2018-01-19 DIAGNOSIS — Z1509 Genetic susceptibility to other malignant neoplasm: Secondary | ICD-10-CM

## 2018-01-19 LAB — CMP (CANCER CENTER ONLY)
ALBUMIN: 4.2 g/dL (ref 3.5–5.0)
ALK PHOS: 94 U/L (ref 38–126)
ALT: 37 U/L (ref 0–44)
AST: 25 U/L (ref 15–41)
Anion gap: 12 (ref 5–15)
BUN: 18 mg/dL (ref 6–20)
CALCIUM: 9.3 mg/dL (ref 8.9–10.3)
CHLORIDE: 103 mmol/L (ref 98–111)
CO2: 24 mmol/L (ref 22–32)
Creatinine: 0.86 mg/dL (ref 0.44–1.00)
GFR, Estimated: >60 mL/min (ref >60)
GLUCOSE: 97 mg/dL (ref 70–99)
Potassium: 3.8 mmol/L (ref 3.5–5.1)
Sodium: 139 mmol/L (ref 135–145)
Total Bilirubin: 0.9 mg/dL (ref 0.3–1.2)
Total Protein: 7.1 g/dL (ref 6.5–8.1)

## 2018-01-19 LAB — CBC WITH DIFFERENTIAL (CANCER CENTER ONLY)
BASOS PCT: 1 %
Basophils Absolute: 0 10*3/uL (ref 0.0–0.1)
EOS ABS: 0.1 10*3/uL (ref 0.0–0.5)
Eosinophils Relative: 3 %
HCT: 31.2 % — ABNORMAL LOW (ref 34.8–46.6)
HEMOGLOBIN: 11 g/dL — AB (ref 11.6–15.9)
LYMPHS ABS: 0.9 10*3/uL (ref 0.9–3.3)
Lymphocytes Relative: 18 %
MCH: 30.1 pg (ref 25.1–34.0)
MCHC: 35.1 g/dL (ref 31.5–36.0)
MCV: 85.9 fL (ref 79.5–101.0)
MONO ABS: 0.4 10*3/uL (ref 0.1–0.9)
MONOS PCT: 9 %
NEUTROS PCT: 69 %
Neutro Abs: 3.4 10*3/uL (ref 1.5–6.5)
Platelet Count: 236 10*3/uL (ref 145–400)
RBC: 3.63 MIL/uL — ABNORMAL LOW (ref 3.70–5.45)
RDW: 19.7 % — AB (ref 11.2–14.5)
WBC Count: 4.9 10*3/uL (ref 3.9–10.3)

## 2018-01-19 MED ORDER — UREA 10 % EX CREA: TOPICAL_CREAM | CUTANEOUS | 0 refills | Status: DC | PRN

## 2018-01-20 ENCOUNTER — Telehealth: Payer: Self-pay | Admitting: Hematology

## 2018-01-20 MED FILL — CAPECITABINE 500 MG TABLET: 500 | 21 days supply | Qty: 112 | Fill #0

## 2018-01-20 NOTE — Telephone Encounter (Signed)
LMVM regarding appointments added / letter/calendar mailed as well per 8/13 los

## 2018-01-21 ENCOUNTER — Encounter (HOSPITAL_COMMUNITY): Payer: Self-pay

## 2018-01-21 ENCOUNTER — Other Ambulatory Visit: Payer: Self-pay

## 2018-01-21 ENCOUNTER — Emergency Department (HOSPITAL_COMMUNITY)
Admission: EM | Admit: 2018-01-21 | Discharge: 2018-01-21 | Disposition: A | Discharge status: Home / Self Care | Payer: Medicaid Other | Attending: Emergency Medicine | Admitting: Emergency Medicine

## 2018-01-21 DIAGNOSIS — R1030 Lower abdominal pain, unspecified: Secondary | ICD-10-CM | POA: Diagnosis present

## 2018-01-21 DIAGNOSIS — Z853 Personal history of malignant neoplasm of breast: Secondary | ICD-10-CM | POA: Diagnosis not present

## 2018-01-21 DIAGNOSIS — N3000 Acute cystitis without hematuria: Secondary | ICD-10-CM | POA: Diagnosis not present

## 2018-01-21 DIAGNOSIS — Z87891 Personal history of nicotine dependence: Secondary | ICD-10-CM | POA: Diagnosis not present

## 2018-01-21 DIAGNOSIS — R109 Unspecified abdominal pain: Secondary | ICD-10-CM

## 2018-01-21 LAB — CBC WITH DIFFERENTIAL/PLATELET
BASOS PCT: 1 %
Basophils Absolute: 0 10*3/uL (ref 0.0–0.1)
EOS ABS: 0.2 10*3/uL (ref 0.0–0.7)
EOS PCT: 3 %
HCT: 28.3 % — ABNORMAL LOW (ref 36.0–46.0)
Hemoglobin: 10.2 g/dL — ABNORMAL LOW (ref 12.0–15.0)
Lymphocytes Relative: 13 %
Lymphs Abs: 0.9 10*3/uL (ref 0.7–4.0)
MCH: 30.4 pg (ref 26.0–34.0)
MCHC: 36 g/dL (ref 30.0–36.0)
MCV: 84.2 fL (ref 78.0–100.0)
MONOS PCT: 9 %
Monocytes Absolute: 0.7 10*3/uL (ref 0.1–1.0)
NEUTROS PCT: 74 %
Neutro Abs: 5.3 10*3/uL (ref 1.7–7.7)
PLATELETS: 211 10*3/uL (ref 150–400)
RBC: 3.36 MIL/uL — ABNORMAL LOW (ref 3.87–5.11)
RDW: 17.7 % — AB (ref 11.5–15.5)
WBC: 7.1 10*3/uL (ref 4.0–10.5)
nRBC: 1 /100 WBC — ABNORMAL HIGH

## 2018-01-21 LAB — COMPREHENSIVE METABOLIC PANEL
ALT: 35 U/L (ref 0–44)
ANION GAP: 9 (ref 5–15)
AST: 25 U/L (ref 15–41)
Albumin: 4 g/dL (ref 3.5–5.0)
Alkaline Phosphatase: 82 U/L (ref 38–126)
BUN: 23 mg/dL — AB (ref 6–20)
CHLORIDE: 106 mmol/L (ref 98–111)
CO2: 24 mmol/L (ref 22–32)
Calcium: 9.2 mg/dL (ref 8.9–10.3)
Creatinine, Ser: 0.79 mg/dL (ref 0.44–1.00)
Glucose, Bld: 96 mg/dL (ref 70–99)
POTASSIUM: 3.6 mmol/L (ref 3.5–5.1)
Sodium: 139 mmol/L (ref 135–145)
TOTAL PROTEIN: 6.7 g/dL (ref 6.5–8.1)
Total Bilirubin: 0.8 mg/dL (ref 0.3–1.2)

## 2018-01-21 LAB — URINALYSIS, MICROSCOPIC (REFLEX)

## 2018-01-21 LAB — URINALYSIS, ROUTINE W REFLEX MICROSCOPIC
Bilirubin Urine: NEGATIVE
GLUCOSE, UA: NEGATIVE mg/dL
Hgb urine dipstick: NEGATIVE
KETONES UR: NEGATIVE mg/dL
Nitrite: NEGATIVE
PROTEIN: NEGATIVE mg/dL
Specific Gravity, Urine: 1.01 (ref 1.005–1.030)
pH: 6 (ref 5.0–8.0)

## 2018-01-21 LAB — PREGNANCY, URINE: Preg Test, Ur: NEGATIVE

## 2018-01-21 MED ORDER — OXYCODONE-ACETAMINOPHEN 5-325 MG PO TABS: 1.0000 | ORAL_TABLET | Freq: Four times a day (QID) | ORAL | 0 refills | Status: DC | PRN

## 2018-01-21 MED ORDER — CIPROFLOXACIN HCL 500 MG PO TABS: 500.0000 mg | ORAL_TABLET | Freq: Two times a day (BID) | ORAL | 0 refills | Status: DC

## 2018-01-21 MED ORDER — CIPROFLOXACIN HCL 500 MG PO TABS
500.0000 mg | ORAL_TABLET | Freq: Once | ORAL | Status: AC
  Administered 2018-01-21: 500 mg via ORAL
  Filled 2018-01-21: qty 1

## 2018-01-21 MED ORDER — OXYCODONE-ACETAMINOPHEN 5-325 MG PO TABS
2.0000 | ORAL_TABLET | Freq: Once | ORAL | Status: AC
  Administered 2018-01-21: 2 via ORAL
  Filled 2018-01-21: qty 2

## 2018-01-21 MED FILL — CIPROFLOXACIN HCL 500 MG TA: 500 | 7 days supply | Qty: 14 | Fill #0

## 2018-01-21 MED FILL — OXYCODONE-ACETAMINOPHEN 5-3: 5-325 | 2 days supply | Qty: 20 | Fill #0

## 2018-01-21 NOTE — ED Triage Notes (Signed)
Pt c/o lower back and flank pain x 2 days.

## 2018-01-21 NOTE — ED Provider Notes (Signed)
Emergency Department Provider Note   I have reviewed the triage vital signs and the nursing notes.   HISTORY  Chief Complaint Flank Pain and Back Pain   HPI Kathryn Stanley is a 42 y.o. female with a history of breast cancer status post mastectomy and multiple lymph node resections also hysterectomy.  She comes in today with a couple days of progressively worsening bilateral flank pain.  She states is colicky in nature but overall seems to be worsening.  States she is a kidney infection in the past that felt like this but she does not have any UTI symptoms.  She never had a kidney stone and does not complain of any hematuria at this time.  No fevers.  No nausea vomiting or abdominal pain.  States the pain radiates down the back of her legs.  She was taking some over-the-counter medications at home which seemed to help with not today. No other associated or modifying symptoms.    Past Medical History:  Diagnosis Date  . Breast cancer, left breast ([place]) dx'd 01/2017  . Complication of anesthesia   . Dermatologic problem    possible psoriasis. Pt has not had been diagnosed by dermatology.  . 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 GERD (gastroesophageal reflux disease)   . PONV (postoperative nausea and vomiting)   . Tobacco dependence    trying to quit with nicotene patches    Patient Active Problem List   Diagnosis Date Noted  . UTI (urinary tract infection) 04/24/2017  . 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  . Genetic testing 03/05/2017  . Port catheter in place 01/30/2017  . Malignant neoplasm of upper-outer quadrant of left breast in female, estrogen receptor positive ([place]) 01/05/2017  . Tobacco use 12/12/2016    Past Surgical History:  Procedure Laterality Date  . BREAST BIOPSY Left 12/2016  . BREAST RECONSTRUCTION Bilateral 07/16/2017   BILATERAL BREAST RECONSTRUCTION WITH PLACEMENT OF  TISSUE EXPANDER AND ALLODERM/notes 07/16/2017  . CESAREAN SECTION  2002  . MASTECTOMY Bilateral 07/16/2017   LEFT SKIN SPARING MASTECTOMY WITH LEFT RADIOACTIVE SEED TARGETED AXILLARY LYMPH NODE EXCISION AND AXILLARY SENTINEL LYMPH NODE BIOPSY; RIGHT PROPHYLACTIC SKIN SPARING MASTECTOMY [name] 07/16/2017  . MASTECTOMY WITH RADIOACTIVE SEED GUIDED EXCISION AND AXILLARY SENTINEL LYMPH NODE BIOPSY Left 07/16/2017   Procedure: LEFT SKIN SPARING MASTECTOMY WITH LEFT RADIOACTIVE SEED TARGETED AXILLARY LYMPH NODE EXCISION AND AXILLARY SENTINEL LYMPH NODE BIOPSY;  Surgeon: [name], MD;  Location: [place];  Service: General;  Laterality: Left;  . NIPPLE SPARING MASTECTOMY Right 07/16/2017   Procedure: RIGHT PROPHYLACTIC SKIN SPARING MASTECTOMY;  Surgeon: [name], MD;  Location: [place];  Service: General;  Laterality: Right;  . PORT-A-CATH REMOVAL N/A 07/16/2017   Procedure: REMOVAL PORT-A-CATH;  Surgeon: [name], MD;  Location: [place];  Service: General;  Laterality: N/A;  . PORTA CATH REMOVAL  07/16/2017  . PORTACATH PLACEMENT N/A 01/14/2017   Procedure: INSERTION PORT-A-CATH WITH [place];  Surgeon: [name], MD;  Location: [place];  Service: General;  Laterality: N/A;  . ROBOTIC ASSISTED TOTAL HYSTERECTOMY WITH BILATERAL SALPINGO OOPHERECTOMY Bilateral 11/03/2017   Procedure: XI ROBOTIC ASSISTED TOTAL HYSTERECTOMY WITH BILATERAL SALPINGO OOPHORECTOMY;  Surgeon: [name], MD;  Location: [facility];  Service: Gynecology;  Laterality: Bilateral;    Current Outpatient Rx  . Order #: [id number] Class: Normal  . Order #: [id number] Class: Normal  . Order #: [id number] Class: Historical Med  .  Order #: [id number] Class: Historical Med  . Order #: [id number] Class: Historical Med  . Order #: [id number] Class: Normal  . Order #: [id number] Class: Print  . Order #: [id number] Class: Print    Allergies Amoxicillin; Other; Chlorhexidine gluconate; and Nicotine  Family History  Problem Relation Age of  Onset  . Stroke Maternal Grandmother   . Stroke Father   . Hypertension Father   . Other Father        Adopted    Social History Social History   Tobacco Use  . Smoking status: Former Smoker    Packs/day: 1.00    Years: 17.00    Pack years: 17.00    Types: Cigarettes    Last attempt to quit: 12/15/2016    Years since quitting: 1.1  . Smokeless tobacco: Never Used  Substance Use Topics  . Alcohol use: No  . Drug use: No    Review of Systems  All other systems negative except as documented in the HPI. All pertinent positives and negatives as reviewed in the HPI. ____________________________________________   PHYSICAL EXAM:  VITAL SIGNS: ED Triage Vitals  Enc Vitals Group     BP 01/21/18 0723 (!) 152/87     Pulse Rate 01/21/18 0723 89     Resp 01/21/18 0723 16     Temp 01/21/18 0723 97.7 F (36.5 C)     Temp Source 01/21/18 0723 Oral     SpO2 01/21/18 0723 98 %    Constitutional: Alert and oriented. Well appearing and in no acute distress. Eyes: Conjunctivae are normal. PERRL. EOMI. Head: Atraumatic. Nose: No congestion/rhinnorhea. Mouth/Throat: Mucous membranes are moist.  Oropharynx non-erythematous. Neck: No stridor.  No meningeal signs.   Cardiovascular: Normal rate, regular rhythm. Good peripheral circulation. Grossly normal heart sounds.   Respiratory: Normal respiratory effort.  No retractions. Lungs CTAB. Gastrointestinal: Soft and nontender. No distention.  Musculoskeletal: No lower extremity tenderness nor edema. No gross deformities of extremities. Neurologic:  Normal speech and language. No gross focal neurologic deficits are appreciated.  Skin:  Skin is warm, dry and intact. No rash noted.   ____________________________________________   LABS (all labs ordered are listed, but only abnormal results are displayed)  Labs Reviewed  URINALYSIS, ROUTINE W REFLEX MICROSCOPIC - Abnormal; Notable for the following components:      Result Value    Leukocytes, UA SMALL (*)    All other components within normal limits  CBC WITH DIFFERENTIAL/PLATELET - Abnormal; Notable for the following components:   RBC 3.36 (*)    Hemoglobin 10.2 (*)    HCT 28.3 (*)    RDW 17.7 (*)    nRBC 1 (*)    All other components within normal limits  COMPREHENSIVE METABOLIC PANEL - Abnormal; Notable for the following components:   BUN 23 (*)    All other components within normal limits  URINALYSIS, MICROSCOPIC (REFLEX) - Abnormal; Notable for the following components:   Bacteria, UA MANY (*)    All other components within normal limits  URINE CULTURE  PREGNANCY, URINE   ____________________________________________   INITIAL IMPRESSION / ASSESSMENT AND PLAN / ED COURSE  42 yo F here with UTI, possibly early pyelo. Not septic. Improved pain. D/w Dr. Burr Medico, who agrees with discharge on oral Abx.    Pertinent labs & imaging results that were available during my care of the patient were reviewed by me and considered in my medical decision making (see chart for details).  ____________________________________________  FINAL CLINICAL IMPRESSION(S) / ED DIAGNOSES  Final  diagnoses:  Flank pain  Acute cystitis without hematuria     MEDICATIONS GIVEN DURING THIS VISIT:  Medications  oxyCODONE-acetaminophen (PERCOCET/ROXICET) 5-325 MG per tablet 2 tablet (2 tablets Oral Given 01/21/18 0819)  ciprofloxacin (CIPRO) tablet 500 mg (500 mg Oral Given 01/21/18 1010)     NEW OUTPATIENT MEDICATIONS STARTED DURING THIS VISIT:  Discharge Medication List as of 01/21/2018 11:03 AM    START taking these medications   Details  oxyCODONE-acetaminophen (PERCOCET) 5-325 MG tablet Take 1-2 tablets by mouth every 6 (six) hours as needed for severe pain., Starting Thu 01/21/2018, Print    ciprofloxacin (CIPRO) 500 MG tablet Take 1 tablet (500 mg total) by mouth 2 (two) times daily., Starting Thu 01/21/2018, Normal        Note:  This note was prepared with  assistance of Dragon voice recognition software. Occasional wrong-word or sound-a-like substitutions may have occurred due to the inherent limitations of voice recognition software.   Merrily Pew, MD 01/21/18 [phone]

## 2018-01-23 LAB — URINE CULTURE

## 2018-01-24 ENCOUNTER — Telehealth: Payer: Self-pay

## 2018-01-24 NOTE — Telephone Encounter (Signed)
Post ED Visit - Positive Culture Follow-up  Culture report reviewed by antimicrobial stewardship pharmacist:  []  Elenor Quinones, Pharm.D. []  Heide Guile, Pharm.D., BCPS AQ-ID []  Parks Neptune, Pharm.D., BCPS []  Alycia Rossetti, Pharm.D., BCPS []  [place], [place].D., BCPS, AAHIVP []  Legrand Como, Pharm.D., BCPS, AAHIVP []  Salome Arnt, PharmD, BCPS []  Johnnette Gourd, PharmD, BCPS []  Hughes Better, PharmD, BCPS []  Leeroy Cha, PharmD [facility] Pharm D Positive urine culture Treated with Ciprofloxacin, organism sensitive to the same and no further patient follow-up is required at this time.  Genia Del 01/24/2018, 11:58 AM

## 2018-01-26 MED FILL — CITALOPRAM HBR 10 MG TABLET: 10 | 30 days supply | Qty: 30 | Fill #1

## 2018-02-02 ENCOUNTER — Telehealth: Payer: Self-pay | Admitting: Nurse Practitioner

## 2018-02-02 NOTE — Telephone Encounter (Signed)
Patient called to reschedule

## 2018-02-10 ENCOUNTER — Ambulatory Visit: Payer: Medicaid Other | Admitting: Nurse Practitioner

## 2018-02-10 ENCOUNTER — Other Ambulatory Visit: Payer: Medicaid Other

## 2018-02-11 ENCOUNTER — Inpatient Hospital Stay: Payer: Medicaid Other | Attending: Hematology

## 2018-02-11 ENCOUNTER — Encounter: Payer: Self-pay | Admitting: Nurse Practitioner

## 2018-02-11 ENCOUNTER — Inpatient Hospital Stay (HOSPITAL_BASED_OUTPATIENT_CLINIC_OR_DEPARTMENT_OTHER): Payer: Medicaid Other | Admitting: Nurse Practitioner

## 2018-02-11 ENCOUNTER — Telehealth: Payer: Self-pay | Admitting: Hematology

## 2018-02-11 ENCOUNTER — Other Ambulatory Visit: Payer: Self-pay

## 2018-02-11 VITALS — BP 112/49 | HR 88 | Temp 98.2°F | Resp 18 | Ht 64.0 in | Wt 198.3 lb

## 2018-02-11 DIAGNOSIS — F39 Unspecified mood [affective] disorder: Secondary | ICD-10-CM

## 2018-02-11 DIAGNOSIS — N390 Urinary tract infection, site not specified: Secondary | ICD-10-CM | POA: Diagnosis not present

## 2018-02-11 DIAGNOSIS — D649 Anemia, unspecified: Secondary | ICD-10-CM | POA: Diagnosis not present

## 2018-02-11 DIAGNOSIS — Z9221 Personal history of antineoplastic chemotherapy: Secondary | ICD-10-CM | POA: Insufficient documentation

## 2018-02-11 DIAGNOSIS — Z90710 Acquired absence of both cervix and uterus: Secondary | ICD-10-CM | POA: Diagnosis not present

## 2018-02-11 DIAGNOSIS — C50412 Malignant neoplasm of upper-outer quadrant of left female breast: Secondary | ICD-10-CM | POA: Diagnosis not present

## 2018-02-11 DIAGNOSIS — Z9013 Acquired absence of bilateral breasts and nipples: Secondary | ICD-10-CM

## 2018-02-11 DIAGNOSIS — Z923 Personal history of irradiation: Secondary | ICD-10-CM | POA: Diagnosis not present

## 2018-02-11 DIAGNOSIS — N3001 Acute cystitis with hematuria: Secondary | ICD-10-CM

## 2018-02-11 DIAGNOSIS — Z170 Estrogen receptor positive status [ER+]: Secondary | ICD-10-CM | POA: Insufficient documentation

## 2018-02-11 DIAGNOSIS — L271 Localized skin eruption due to drugs and medicaments taken internally: Secondary | ICD-10-CM

## 2018-02-11 DIAGNOSIS — R300 Dysuria: Secondary | ICD-10-CM

## 2018-02-11 DIAGNOSIS — Z1501 Genetic susceptibility to malignant neoplasm of breast: Secondary | ICD-10-CM

## 2018-02-11 LAB — CMP (CANCER CENTER ONLY)
ALBUMIN: 4.3 g/dL (ref 3.5–5.0)
ALK PHOS: 92 U/L (ref 38–126)
ALT: 35 U/L (ref 0–44)
AST: 24 U/L (ref 15–41)
Anion gap: 10 (ref 5–15)
BILIRUBIN TOTAL: 1 mg/dL (ref 0.3–1.2)
BUN: 20 mg/dL (ref 6–20)
CO2: 24 mmol/L (ref 22–32)
CREATININE: 0.93 mg/dL (ref 0.44–1.00)
Calcium: 9.3 mg/dL (ref 8.9–10.3)
Chloride: 105 mmol/L (ref 98–111)
GFR, Estimated: >60 mL/min (ref >60)
Glucose, Bld: 92 mg/dL (ref 70–99)
POTASSIUM: 3.6 mmol/L (ref 3.5–5.1)
Sodium: 139 mmol/L (ref 135–145)
TOTAL PROTEIN: 6.8 g/dL (ref 6.5–8.1)

## 2018-02-11 LAB — URINALYSIS, COMPLETE (UACMP) WITH MICROSCOPIC
BILIRUBIN URINE: NEGATIVE
Glucose, UA: NEGATIVE mg/dL
HGB URINE DIPSTICK: NEGATIVE
Ketones, ur: NEGATIVE mg/dL
NITRITE: POSITIVE — AB
Protein, ur: NEGATIVE mg/dL
SPECIFIC GRAVITY, URINE: 1.026 (ref 1.005–1.030)
WBC, UA: >50 WBC/hpf — ABNORMAL HIGH (ref 0–5)
pH: 5 (ref 5.0–8.0)

## 2018-02-11 LAB — CBC WITH DIFFERENTIAL (CANCER CENTER ONLY)
BASOS PCT: 0 %
Basophils Absolute: 0 10*3/uL (ref 0.0–0.1)
EOS ABS: 0.2 10*3/uL (ref 0.0–0.5)
EOS PCT: 3 %
HCT: 24.3 % — ABNORMAL LOW (ref 34.8–46.6)
Hemoglobin: 8.6 g/dL — ABNORMAL LOW (ref 11.6–15.9)
Lymphocytes Relative: 20 %
Lymphs Abs: 1.4 10*3/uL (ref 0.9–3.3)
MCH: 31.3 pg (ref 25.1–34.0)
MCHC: 35.4 g/dL (ref 31.5–36.0)
MCV: 88.4 fL (ref 79.5–101.0)
MONO ABS: 0.4 10*3/uL (ref 0.1–0.9)
Monocytes Relative: 6 %
Neutro Abs: 5 10*3/uL (ref 1.5–6.5)
Neutrophils Relative %: 71 %
PLATELETS: 176 10*3/uL (ref 145–400)
RBC: 2.75 MIL/uL — AB (ref 3.70–5.45)
RDW: 20.1 % — AB (ref 11.2–14.5)
WBC: 7.1 10*3/uL (ref 3.9–10.3)

## 2018-02-11 MED ORDER — NITROFURANTOIN MONOHYD MACRO 100 MG PO CAPS: 100.0000 mg | ORAL_CAPSULE | Freq: Two times a day (BID) | ORAL | 0 refills | Status: DC

## 2018-02-11 MED FILL — NITROFURANTOIN MONO-MCR 100: 100 | 7 days supply | Qty: 14 | Fill #0

## 2018-02-11 NOTE — Progress Notes (Signed)
Kathryn Stanley  [phone] [phone]  Clinic Follow up Note   Patient Care Team: Mosetta Anis, MD as PCP - Joylene Draft, MD as Consulting Physician (General Surgery) [name], MD as Consulting Physician (Hematology) [name], MD as Consulting Physician (Radiation Oncology) 02/11/2018  SUMMARY OF ONCOLOGIC HISTORY: Oncology History   Cancer Staging Malignant neoplasm of upper-outer quadrant of left breast in female, estrogen receptor positive ([place]) Staging form: Breast, AJCC 8th Edition - Clinical stage from 12/29/2016: Stage IIIA (cT2, cN1, cM0, G3, ER: Positive, PR: Negative, HER2: Negative) - Signed by [name], MD on 01/07/2017 - Pathologic: No Stage Recommended (ypT1c, pN0, cM0, G2, ER-, PR-, HER2-) - Unsigned Cancer Staging Malignant neoplasm of upper-outer quadrant of left breast in female, estrogen receptor positive ([place]) Staging form: Breast, AJCC 8th Edition - Clinical stage from 12/29/2016: Stage IIIA (cT2, cN1, cM0, G3, ER: Positive, PR: Negative, HER2: Negative) - Signed by [name], MD on 01/07/2017 - Pathologic: No Stage Recommended (ypT1c, pN0, cM0, G2, ER-, PR-, HER2-) - Unsigned       Malignant neoplasm of upper-outer quadrant of left breast in female, estrogen receptor positive ([place])   12/25/2016 Mammogram    [place] and [facility] Diagnostic Breast Tomo Bilateral 12/25/16 IMPRESSION: 1. Suspicious mass 2.3 x 1.6 x 3.0 cm  in the 130 o'clock location of the left breast 8cm from the nipple, biopsy recommended. Adjacent simple cyst is 1.9 cm. 2. Suspicious left axillary lymph node 2.2cm for which biopsy is indicated. 3. Indeterminate group of calcifications in the upper central portion of the left breast for which biopsy is recommended.     12/29/2016 Initial Biopsy    Diagnosis 12/29/16 1. Breast, left, needle core biopsy, stereotactic, upper inner quadrant - FIBROCYSTIC CHANGES INCLUDING APOCRINE METAPLASIA WITH  CALCIFICATIONS - NO MALIGNANCY IDENTIFIED 2. Breast, left, needle core biopsy, ultrasound, 1:30 o'clock - INVASIVE MAMMARY CARCINOMA, G3 - SEE COMMENT 3. Lymph node, needle/core biopsy, ultrasound, left axillary - METASTIC CARCINOMA INVOLVING ONE LYMPH NODE (1/1) E-cadherin is positive supporting a ductal origin.     12/29/2016 Receptors her2    ER 15%+, weak staining  PR - HER2- Ki67 10%     12/29/2016 Initial Diagnosis    Malignant neoplasm of upper-outer quadrant of left breast in female, estrogen receptor positive ([place])    01/13/2017 Echocardiogram    ECHO 01/13/17 Study Conclusions - Left ventricle: The cavity size was normal. Systolic function was   normal. The estimated ejection fraction was in the range of 60%   to 65%. Wall motion was normal; there were no regional wall   motion abnormalities. Left ventricular diastolic function   parameters were normal. - Mitral valve: There was trivial regurgitation. - Right atrium: The atrium was mildly dilated. - Tricuspid valve: There was trivial regurgitation.     01/14/2017 Surgery    Port placement by Dr. Donne Hazel     01/15/2017 Imaging    Whole Body bone Scan 01/15/17 IMPRESSION: 1.  No significant abnormality identified.    01/15/2017 Imaging    CT CAP W Contrast 01/15/17 IMPRESSION: 1. Left lateral breast mass with pathologic left axillary adenopathy including a 2.0 cm left axillary lymph node. 2. Several hypodense lesions in the liver are likely benign lesions such as cysts. However, these are technically too small to characterize by CT. Possibilities for further workup include surveillance or hepatic protocol MRI with and without contrast. 3. There is also a tiny hypodense lesion in the spleen which  is technically nonspecific but statistically highly likely to be benign. 4.  Prominent stool throughout the colon favors constipation    01/16/2017 - 06/05/2017 Chemotherapy    Neoadjuvant chemo AC every 2 weeks for 4 cycles  starting on 01/16/2017 and ended 02/27/17. Then proceed with weekly taxol for 12 treatments starting 03/13/17. Added Carboplatin AUC 2 on 04/03/17 to weekly taxol. Due to symptomatic thrombocytopenia we held Carbo since cycle 8. Cycle 9 was post-poned due to neutropenia and Granix was added starting 05/12/17. Added carbo back at reduced dose AUC 1.5 with cycle 10 on 05/22/17. 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   Analysis also detected a Variant of Uncertain Significance (VUS) in the NBN gene called c.628G>T (p.Val210Phe)    06/12/2017 Imaging    MR BREAST BILATERAL WO CONTRAST IMPRESSION: 1. Significant positive response to neoadjuvant chemotherapy. The index carcinoma in the left breast has significantly decreased in size. Left axillary adenopathy has also significantly improved. 2. No other evidence of malignancy.    07/16/2017 Surgery    Pt underwent a left mastectomy with Dr. Donne Hazel    07/16/2017 Pathology Results    Diagnosis 07/16/17 1. Breast, simple mastectomy, Right Prophylactic - FIBROCYSTIC CHANGES INCLUDING APOCRINE METAPLASIA - DUCT ECTASIA - CALCIFICATIONS - NO MALIGNANCY IDENTIFIED 2. Lymph node, sentinel, biopsy, Left Axillary - NO CARCINOMA IDENTIFIED IN ONE LYMPH NODE (0/1) - SEE COMMENT 3. Lymph node, sentinel, biopsy, Left - NO CARCINOMA IDENTIFIED IN ONE LYMPH NODE (0/1) - SEE COMMENT 4. Breast, simple mastectomy, Left - INVASIVE DUCTAL CARCINOMA, NOTTINGHAM GRADE 2 OF 3, 1.3 CM - MARGINS UNINVOLVED BY CARCINOMA (2 CM POSTERIOR MARGIN) - PREVIOUS BIOPSY SITE CHANGES - CALCIFICATIONS - SEE ONCOLOGY TABLE BELOW    07/16/2017 Receptors her2    Results: IMMUNOHISTOCHEMICAL AND MORPHOMETRIC ANALYSIS PERFORMED MANUALLY Estrogen Receptor: 0%, NEGATIVE Progesterone Receptor: 0%, NEGATIVE Results: HER2 - NEGATIVE RATIO OF HER2/CEP17  SIGNALS 1.73 AVERAGE HER2 COPY NUMBER PER CELL 2.67    09/02/2017 - 10/16/2017 Radiation Therapy    Adjuvant radiation per Dr. Lisbeth Merfeld     11/03/2017 Surgery    XI ROBOTIC ASSISTED TOTAL HYSTERECTOMY WITH BILATERAL SALPINGO OOPHORECTOMY  by Dr. Denman George and Dr. Delsa Sale 11/03/17    11/23/2017 -  Chemotherapy    Xeloda 2055m BID 2 weeks on and 1 week off starting on 11/23/17 for 2-3 months   CURRENT THERAPY:  Xeloda 20075mBID 2 weeks on and 1 week off starting on 11/23/17 for 3 months; completed 02/08/18   INTERVAL HISTORY: Ms. ReMcneelyeturns for follow up as scheduled. She was last seen on 8/13 by Dr. FeBurr MedicoShe went to ER on 8/15 with flank pain and was found to have E.coli positive UTI. She completed 5 day course of cipro. She completed last course of Xeloda 9/2. Her fatigue has improved some, but remains tired. Her hands and feet are red and dry with cracks and burning. She ran out of urea cream, but did not feel it was helpful. She has also tried ice, hydrocortisone, and aloe vera without much relief. She washes hands often at work and immediately applies moisturizing lotion which she feels has been the most effective. She has intermittent numbness and tingling, but remains functional with preserved dexterity. Denies mucositis. Appetite is fair. She has intermittent loose stool, no n/v/c. She has come cough with reflux symptoms. Denies chest pain, dyspnea,  fever, or chills. She feels she has another UTI, with urinary frequency as her primary symptom. Denies hematuria or dysuria. Takes cranberry pills and drinks lots of water. Celexa is helping her mood, although she is emotional today feeling overwhelmed about transitioning out of active treatment and into surveillance.    MEDICAL HISTORY:  Past Medical History:  Diagnosis Date  . Breast cancer, left breast ([place]) dx'd 01/2017  . Complication of anesthesia   . Dermatologic problem    possible psoriasis. Pt has not had been diagnosed by  dermatology.  . 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 GERD (gastroesophageal reflux disease)   . PONV (postoperative nausea and vomiting)   . Tobacco dependence    trying to quit with nicotene patches    SURGICAL HISTORY: Past Surgical History:  Procedure Laterality Date  . BREAST BIOPSY Left 12/2016  . BREAST RECONSTRUCTION Bilateral 07/16/2017   BILATERAL BREAST RECONSTRUCTION WITH PLACEMENT OF TISSUE EXPANDER AND ALLODERM/notes 07/16/2017  . CESAREAN SECTION  2002  . MASTECTOMY Bilateral 07/16/2017   LEFT SKIN SPARING MASTECTOMY WITH LEFT RADIOACTIVE SEED TARGETED AXILLARY LYMPH NODE EXCISION AND AXILLARY SENTINEL LYMPH NODE BIOPSY; RIGHT PROPHYLACTIC SKIN SPARING MASTECTOMY [name] 07/16/2017  . MASTECTOMY WITH RADIOACTIVE SEED GUIDED EXCISION AND AXILLARY SENTINEL LYMPH NODE BIOPSY Left 07/16/2017   Procedure: LEFT SKIN SPARING MASTECTOMY WITH LEFT RADIOACTIVE SEED TARGETED AXILLARY LYMPH NODE EXCISION AND AXILLARY SENTINEL LYMPH NODE BIOPSY;  Surgeon: [name], MD;  Location: [facility];  Service: General;  Laterality: Left;  . NIPPLE SPARING MASTECTOMY Right 07/16/2017   Procedure: RIGHT PROPHYLACTIC SKIN SPARING MASTECTOMY;  Surgeon: [name], MD;  Location: [facility];  Service: General;  Laterality: Right;  . PORT-A-CATH REMOVAL N/A 07/16/2017   Procedure: REMOVAL PORT-A-CATH;  Surgeon: [name], MD;  Location: [facility];  Service: General;  Laterality: N/A;  . PORTA CATH REMOVAL  07/16/2017  . PORTACATH PLACEMENT N/A 01/14/2017   Procedure: INSERTION PORT-A-CATH WITH [place];  Surgeon: [name], MD;  Location: [facility];  Service: General;  Laterality: N/A;  . ROBOTIC ASSISTED TOTAL HYSTERECTOMY WITH BILATERAL SALPINGO OOPHERECTOMY Bilateral 11/03/2017   Procedure: XI ROBOTIC ASSISTED TOTAL HYSTERECTOMY WITH BILATERAL SALPINGO OOPHORECTOMY;  Surgeon: [name], MD;  Location: [facility];  Service:  Gynecology;  Laterality: Bilateral;    I have reviewed the social history and family history with the patient and they are unchanged from previous note.  ALLERGIES:  is allergic to amoxicillin; other; chlorhexidine gluconate; and nicotine.  MEDICATIONS:  Current Outpatient Medications  Medication Sig Dispense Refill  . citalopram (CELEXA) 10 MG tablet Take 1 tablet (10 mg total) by mouth daily. (Patient taking differently: Take 10 mg by mouth at bedtime. ) 30 tablet 1  . famotidine (PEPCID) 10 MG tablet Take 10 mg by mouth daily as needed for heartburn or indigestion.    Marland Kitchen ibuprofen (ADVIL,MOTRIN) 200 MG tablet Take 200-600 mg by mouth daily as needed for headache or moderate pain.     . Magnesium 250 MG TABS Take 1 tablet by mouth daily.    . urea (CARMOL) 10 % cream Apply topically as needed. (Patient taking differently: Apply 1 application topically as needed. ) 71 g 0  . nitrofurantoin, macrocrystal-monohydrate, (MACROBID) 100 MG capsule Take 1 capsule (100 mg total) by mouth 2 (two) times daily. 14 capsule 0  . oxyCODONE-acetaminophen (PERCOCET) 5-325 MG tablet Take 1-2 tablets by mouth every 6 (six) hours as needed  for severe pain. (Patient not taking: Reported on 02/11/2018) 20 tablet 0   No current facility-administered medications for this visit.     PHYSICAL EXAMINATION: ECOG PERFORMANCE STATUS: 1 - Symptomatic but completely ambulatory  Vitals:   02/11/18 1442  BP: (!) 112/49  Pulse: 88  Resp: 18  Temp: 98.2 F (36.8 C)  SpO2: 100%   Filed Weights   02/11/18 1442  Weight: 198 lb 4.8 oz (89.9 kg)    GENERAL:alert, no distress and comfortable SKIN: no rashes or significant lesions. Erythema with dryness on palms and feet and a few cracks on digits   EYES:  sclera clear OROPHARYNX:no thrush or ulcers LYMPH:  no palpable cervical, supraclavicular, or axillary lymphadenopathy  LUNGS: clear to auscultation with normal breathing effort HEART: regular rate & rhythm and no  murmurs and no lower extremity edema ABDOMEN:abdomen soft, non-tender and normal bowel sounds Musculoskeletal:no cyanosis of digits and no clubbing  NEURO: alert & oriented x 3 with fluent speech, no focal motor/sensory deficits Breast exam with bilateral tissue expanders, incisions healed well. left chest wall hyperpigmentation from radiation   LABORATORY DATA:  I have reviewed the data as listed CBC Latest Ref Rng & Units 02/11/2018 01/21/2018 01/19/2018  WBC 3.9 - 10.3 K/uL 7.1 7.1 4.9  Hemoglobin 11.6 - 15.9 g/dL 8.6(L) 10.2(L) 11.0(L)  Hematocrit 34.8 - 46.6 % 24.3(L) 28.3(L) 31.2(L)  Platelets 145 - 400 K/uL 176 211 236     CMP Latest Ref Rng & Units 02/11/2018 01/21/2018 01/19/2018  Glucose 70 - 99 mg/dL 92 96 97  BUN 6 - 20 mg/dL 20 23(H) 18  Creatinine 0.44 - 1.00 mg/dL 0.93 0.79 0.86  Sodium 135 - 145 mmol/L 139 139 139  Potassium 3.5 - 5.1 mmol/L 3.6 3.6 3.8  Chloride 98 - 111 mmol/L 105 106 103  CO2 22 - 32 mmol/L _0 Calcium 8.9 - 10.3 mg/dL 9.3 9.2 9.3  Total Protein 6.5 - 8.1 g/dL 6.8 6.7 7.1  Total Bilirubin 0.3 - 1.2 mg/dL 1.0 0.8 0.9  Alkaline Phos 38 - 126 U/L 92 82 94  AST 15 - 41 U/L _1 ALT 0 - 44 U/L 35 35 37   PATHOLOGY  Diagnosis 11/03/17 Uterus, cervix and bilateral fallopian tubes, with ovaries - BENIGN PROLIFERATIVE ENDOMETRIUM. - BENIGN UNREMARKABLE CERVIX. - BENIGN FOLLICULAR CYSTS OF BILATERAL OVARIES. - BENIGN UNREMARKABLE BILATERAL FALLOPIAN TUBES. - NEGATIVE FOR HYPERPLASIA OR MALIGNANCY.  Cytology Diagnosis 11/03/17 PERITONEAL WASHING(SPECIMEN 1 OF 1 COLLECTED 11/03/17): REACTIVE MESOTHELIAL CELLS PRESENT.   Diagnosis 12/29/16 1. Breast, left, needle core biopsy, stereotactic, upper inner quadrant - FIBROCYSTIC CHANGES INCLUDING APOCRINE METAPLASIA WITH CALCIFICATIONS - NO MALIGNANCY IDENTIFIED 2. Breast, left, needle core biopsy, ultrasound, 1:30 o'clock - INVASIVE MAMMARY CARCINOMA - SEE COMMENT 3. Lymph node, needle/core  biopsy, ultrasound, left axillary - METASTIC CARCINOMA INVOLVING ONE LYMPH NODE (1/1) Microscopic Comment 2. The biopsy material shows an infiltrative proliferation of cells with large vesicular nuclei with conspicuous nucleoli, arranged linearly and in small clusters. There are scattered cells with marked pleomorphism and cytologic atypia. Based on the biopsy, the carcinoma appears Nottingham grade 3 of 3 and measures 0.9 cm in greatest linear extent. E-cadherin and prognostic markers (ER/PR/ki-67/HER2-FISH)are pending and will be reported in an addendum. Dr. Tresa Moore reviewed the case and agrees with the above diagnosis. This case was called to [facility] on December 30, 2016. Results: IMMUNOHISTOCHEMICAL AND MORPHOMETRIC ANALYSIS PERFORMED MANUALLY Estrogen Receptor: 15%, POSITIVE, WEAK STAINING  INTENSITY Progesterone Receptor: 0%, NEGATIVE Proliferation Marker Ki67: 10% COMMENT: The negative hormone receptor study(ies) in this case has An internal positive control. Results: HER2 - NEGATIVE RATIO OF HER2/CEP17 SIGNALS 1.63 AVERAGE HER2 COPY NUMBER PER CELL 3.10  Diagnosis 07/16/17 1. Breast, simple mastectomy, Right Prophylactic - FIBROCYSTIC CHANGES INCLUDING APOCRINE METAPLASIA - DUCT ECTASIA - CALCIFICATIONS - NO MALIGNANCY IDENTIFIED 2. Lymph node, sentinel, biopsy, Left Axillary - NO CARCINOMA IDENTIFIED IN ONE LYMPH NODE (0/1) - SEE COMMENT 3. Lymph node, sentinel, biopsy, Left - NO CARCINOMA IDENTIFIED IN ONE LYMPH NODE (0/1) - SEE COMMENT 4. Breast, simple mastectomy, Left - INVASIVE DUCTAL CARCINOMA, NOTTINGHAM GRADE 2 OF 3, 1.3 CM - MARGINS UNINVOLVED BY CARCINOMA (2 CM POSTERIOR MARGIN) - PREVIOUS BIOPSY SITE CHANGES - CALCIFICATIONS - SEE ONCOLOGY TABLE BELOW Microscopic Comment 2. and 3. Cytokeratin AE1/3 was performed on the sentinel lymph nodes to exclude micrometastasis. There is no evidence of metastatic carcinoma by immunohistochemistry. 4.  BREAST, STATUS POST NEOADJUVANT TREATMENT Procedure: Total mastectomy Laterality: Left Tumor Size: 1.3 cm (glass slide measurement) Histologic Type: Invasive carcinoma of no special type (ductal, not otherwise specified) Grade: Nottingham Grade 2 Tubular Differentiation: 3 Nuclear Pleomorphism: 3 Mitotic Count: 1 Ductal Carcinoma in Situ (DCIS): Not identified Regional Lymph Nodes: Number of Lymph Nodes Examined: 2 Number of Sentinel Lymph Nodes Examined: 2 Lymph Nodes with Macrometastases: 0 Lymph Nodes with Micrometastases: 0 2 of 5 FINAL for Kathryn Stanley, Kathryn Stanley ([id number]) Microscopic Comment(continued) Lymph Nodes with Isolated Tumor Cells: 0 Margins: Uninvolved by invasive carcinoma Invasive carcinoma, distance from closest margin: 2 cm (posterior/deep) DCIS, distance from closest margin: N/A Breast Prognostic Profile (pre-neoadjuvant case #: [id number]) Estrogen Receptor: Positive (15%, weak) Progesterone Receptor: Negative Her2: Negative (Ratio 1.63) Ki-67: 10% Will be repeated on the current case (Block #: [id number]) and the results reported separately. Residual Cancer Burden (RCB): Primary Tumor Bed: 13 mm x 10 mm Overall Cancer Cellularity: 20% Percentage of Cancer that is in Situ: N/A Number of Positive Lymph Nodes: 0 Diameter of Largest Lymph Node metastasis: N/A Residual Cancer Burden : 1.611 Residual Cancer Burden Class: RCB-II Pathologic Stage Classification (p TNM, AJCC 8th Edition): Primary Tumor: ypT1c Regional Lymph Nodes: ypN0 COMMENT: E-cadherin immunohistochemistry was performed on the biopsy material and was positive supporting a ductal origin. Results: IMMUNOHISTOCHEMICAL AND MORPHOMETRIC ANALYSIS PERFORMED MANUALLY Estrogen Receptor: 0%, NEGATIVE Progesterone Receptor: 0%, NEGATIVE Results: HER2 - NEGATIVE RATIO OF HER2/CEP17 SIGNALS 1.73 AVERAGE HER2 COPY NUMBER PER CELL 2.67   RADIOGRAPHIC STUDIES: I have personally reviewed the radiological  images as listed and agreed with the findings in the report. No results found.   ASSESSMENT & PLAN: Kathryn Stanley is a 42 y.o. premenopausal female with no significant past medical history, presented with a palpable left breast mass.  1. Malignant neoplasm of upper-outer quadrant of left breast in female, ductal carcinoma, cT2N1M0, Stage IIIa, ER weakly +, PR - , HER2 - , Grade 3, ypT1cN0M0, triple negative  -Kathryn Stanley appears well. She completed adjuvant Xeloda on 02/08/18, she tolerated moderately well with fatigue and hand-foot syndrome. We discussed skin care. She did not find urea or hydrocortisone helpful, instead uses over the counter remedies and generic moisturizer.  -CBC and CMP reviewed, she has moderate anemia, Hgb 8.5, which is likely contributing to her anemia. I reviewed this should gradually improve given she has completed chemotherapy. I reviewed if she has worsening fatigue or new concerning symptoms, she will call [facility].  -she is interested in clinical trial, but also  wants to have reconstructive breast surgery in 06/2018 with Dr. Iran Planas. She met with research RN Kathryn Stanley today to screen for SWOG [phone]. She plans to call Dr. Iran Planas to discuss with her, and will let us know if she wishes to proceed with the trial randomization.  -If she proceeds with trial and starts Keytruda, we will see her with first treatment; if not, we will see her q3-4 months for the first year for routine surveillance   2. 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 Analysis also detecteda Variant of Uncertain Significance (VUS) in the NBN gene calledc.628G>T (p.Val210Phe). -She underwent mastectomy with Dr. Donne Hazel on 07/16/17 and will have  Reconstruction with plastic surgeon Dr. Ashley Mariner.  -She underwent total hysterectomy and BSO with Dr. Denman George on 11/03/17.   3. Financial support   4. Mood swing  -Improved on celexa.  -she has  been emotional lately as she transitions out of active treatment and into surveillance. I provided validation and emotional support.  -If she does not proceed with the clinical trial, I will refer her to survivorship clinic. She declined other [facility] resources today, she has strong support system.  5. Hand-foot syndrome, leg and back cramps  -secondary to xeloda  -We discussed skin care today, will not refill urea as this was not helpful.  -I reviewed given she washes her hands frequently at work, her recovery may take longer. She applies moisturizer frequently  6. Recurrent UTI -She was recently seen in ER for flank pain and found to have E.coli positive UTI. She completed Cipro on 8/20.  -She has recurrent symptoms today, primarily urinary frequency.  -UA positive for nitrites, leukocytes, and bacteria. I prescribed macrobid 1 tab BID x7 days given recurrent infection and recent cipro -C/S pending  PLAN -Labs reviewed  -RN screening for clinical trial, patient to call with decision after speaking with Dr. Iran Planas -Lab, f/u with Dr. Burr Medico in 2-3 months or sooner pending clinical trial  -Rx: macrobid 1 tab BID x7 days  -C/S pending   Orders Placed This Encounter  Procedures  . Urine Culture    Please do culture/sensitivity on UA from today 9/5, thanks    Standing Status:   Future    Standing Expiration Date:   02/11/2019   All questions were answered. The patient knows to call the clinic with any problems, questions or concerns. No barriers to learning was detected. I spent 20 minutes counseling the patient face to face. The total time spent in the appointment was 25 minutes and more than 50% was on counseling and review of test results     Alla Feeling, NP 02/11/18

## 2018-02-11 NOTE — Telephone Encounter (Signed)
Appts scheduled avs/calendar declined due to my chart per 9/5 los

## 2018-02-16 ENCOUNTER — Encounter: Payer: Self-pay | Admitting: Medical Oncology

## 2018-03-11 ENCOUNTER — Ambulatory Visit: Payer: Medicaid Other | Admitting: Internal Medicine

## 2018-03-11 ENCOUNTER — Other Ambulatory Visit: Payer: Self-pay

## 2018-03-11 VITALS — BP 123/55 | HR 135 | Temp 100.0°F | Ht 64.0 in | Wt 196.8 lb

## 2018-03-11 DIAGNOSIS — Z79899 Other long term (current) drug therapy: Secondary | ICD-10-CM | POA: Diagnosis not present

## 2018-03-11 DIAGNOSIS — C50912 Malignant neoplasm of unspecified site of left female breast: Secondary | ICD-10-CM | POA: Diagnosis not present

## 2018-03-11 DIAGNOSIS — J069 Acute upper respiratory infection, unspecified: Secondary | ICD-10-CM | POA: Diagnosis not present

## 2018-03-11 DIAGNOSIS — Z170 Estrogen receptor positive status [ER+]: Secondary | ICD-10-CM | POA: Diagnosis not present

## 2018-03-11 HISTORY — DX: Acute upper respiratory infection, unspecified: J06.9

## 2018-03-11 MED ORDER — OSELTAMIVIR PHOSPHATE 75 MG PO CAPS: 75.0000 mg | ORAL_CAPSULE | Freq: Two times a day (BID) | ORAL | 0 refills | Status: AC

## 2018-03-11 MED FILL — TAMIFLU 75 MG GELCAP: 75 | 5 days supply | Qty: 10 | Fill #0

## 2018-03-11 NOTE — Patient Instructions (Addendum)
It was a pleasure to see you today Kathryn Stanley. I am sorry to hear that you are not doing well. Your signs and symptoms are concerning for flu. Therefore, we plan to treat you with tamiflu 2m twice a day for 5 days. Please also make sure to keep yourself well hydrated and use acetaminophen for fever.   If you have any questions or concerns, please call our clinic at [phone] 9am-5pm and after hours call [phone] and ask for the internal medicine resident on call. If you feel you are having a medical emergency please call 911.   Thank you, we look forward to help you remain healthy!  VLars Mage MD Internal Medicine PGY2   Influenza, Adult Influenza ("the flu") is an infection in the lungs, nose, and throat (respiratory tract). It is caused by a virus. The flu causes many common cold symptoms, as well as a high fever and body aches. It can make you feel very sick. The flu spreads easily from person to person (is contagious). Getting a flu shot (influenza vaccination) every year is the best way to prevent the flu. Follow these instructions at home:  Take over-the-counter and prescription medicines only as told by your doctor.  Use a cool mist humidifier to add moisture (humidity) to the air in your home. This can make it easier to breathe.  Rest as needed.  Drink enough fluid to keep your pee (urine) clear or pale yellow.  Cover your mouth and nose when you cough or sneeze.  Wash your hands with soap and water often, especially after you cough or sneeze. If you cannot use soap and water, use hand sanitizer.  Stay home from work or school as told by your doctor. Unless you are visiting your doctor, try to avoid leaving home until your fever has been gone for 24 hours without the use of medicine.  Keep all follow-up visits as told by your doctor. This is important. How is this prevented?  Getting a yearly (annual) flu shot is the best way to avoid getting the flu. You  may get the flu shot in late summer, fall, or winter. Ask your doctor when you should get your flu shot.  Wash your hands often or use hand sanitizer often.  Avoid contact with people who are sick during cold and flu season.  Eat healthy foods.  Drink plenty of fluids.  Get enough sleep.  Exercise regularly. Contact a doctor if:  You get new symptoms.  You have: ? Chest pain. ? Watery poop (diarrhea). ? A fever.  Your cough gets worse.  You start to have more mucus.  You feel sick to your stomach (nauseous).  You throw up (vomit). Get help right away if:  You start to be short of breath or have trouble breathing.  Your skin or nails turn a bluish color.  You have very bad pain or stiffness in your neck.  You get a sudden headache.  You get sudden pain in your face or ear.  You cannot stop throwing up. This information is not intended to replace advice given to you by your health care provider. Make sure you discuss any questions you have with your health care provider. Document Released: 03/04/2008 Document Revised: 11/01/2015 Document Reviewed: 03/20/2015 Elsevier Interactive Patient Education  2017 [employer]

## 2018-03-11 NOTE — Assessment & Plan Note (Signed)
The patient presented with 3-day history of upper respiratory symptoms.  She states that she has been having a productive cough bringing up yellow mucus, headaches, dizziness, raspiness in her voice, muscle aches, sore throat, stuffy nose, shortness of breath.  She denies any ear pain, vomiting, diarrhea, abdominal pain.  Patient states that her daughter also had similar symptoms recently that lasted 1 week in duration.  She has not had a flu shot this year as she has been on chemotherapy and was not allowed to have it while on the medication.  Assessment and plan The patient is immunocompromised due to her breast cancer which predisposes her to several bacterial and viral infections.  The patient signs and symptoms are concerning for influenza.  Therefore, we opted to go ahead and treat the patient rather than test her.  -Prescribe patient Tamiflu 75 mg twice daily for 5-day duration -Counseled patient and her husband regarding supportive therapy including hydration, acetaminophen for fevers, rest -Gave patient a facial mask to prevent her from spreading the virus -Patient's husband who accompanied patient during the visit was also given influenza vaccine during this visit -Ordered CBC to check for leukocytosis

## 2018-03-11 NOTE — Progress Notes (Signed)
   CC: Upper respiratory symptoms  HPI:  [name] is a 42 y.o. with ER+, PR-, HER2-, BRCA + Stage IIIA breast cancer who presents with upper respiratory symptoms. Please see problem based charting for evaluation, assessment, and plan.   Past Medical History:  Diagnosis Date  . Breast cancer, left breast ([place]) dx'd 01/2017  . Complication of anesthesia   . Dermatologic problem    possible psoriasis. Pt has not had been diagnosed by dermatology.  . 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 GERD (gastroesophageal reflux disease)   . PONV (postoperative nausea and vomiting)   . Tobacco dependence    trying to quit with nicotene patches   Review of Systems:   Review of Systems  Constitutional: Positive for fever and malaise/fatigue.  Respiratory: Positive for cough.   Cardiovascular: Negative for chest pain.  Gastrointestinal: Positive for nausea. Negative for abdominal pain, diarrhea and vomiting.  Neurological: Positive for dizziness.   Physical Exam:  Vitals:   03/11/18 1039  BP: (!) 123/55  Pulse: (!) 135  Temp: 100 F (37.8 C)  TempSrc: Oral  SpO2: 100%  Weight: 196 lb 12.8 oz (89.3 kg)  Height: _0  (1.626 m)   Physical Exam  Constitutional: She appears well-developed and well-nourished. No distress.  HENT:  Head: Normocephalic and atraumatic.  Right Ear: No tenderness. Tympanic membrane is not injected and not erythematous.  Left Ear: No tenderness. Tympanic membrane is not injected and not erythematous.  Mouth/Throat: No uvula swelling. Posterior oropharyngeal erythema (mild amount) present. No oropharyngeal exudate or tonsillar abscesses.  Cardiovascular: Regular rhythm. Tachycardia present.  Respiratory: Effort normal. No respiratory distress. She has wheezes.  GI: Soft. Bowel sounds are normal. She exhibits no distension. There is no tenderness.  Skin: She is not diaphoretic.     Assessment & Plan:   See Encounters Tab for problem based charting.  Patient discussed with Dr. Dareen Piano

## 2018-03-12 LAB — CBC
HEMATOCRIT: 34.3 % (ref 34.0–46.6)
HEMOGLOBIN: 12.2 g/dL (ref 11.1–15.9)
MCH: 33.5 pg — ABNORMAL HIGH (ref 26.6–33.0)
MCHC: 35.6 g/dL (ref 31.5–35.7)
MCV: 94 fL (ref 79–97)
Platelets: 212 10*3/uL (ref 150–450)
RBC: 3.64 x10E6/uL — ABNORMAL LOW (ref 3.77–5.28)
RDW: 13.6 % (ref 12.3–15.4)
WBC: 6.8 10*3/uL (ref 3.4–10.8)

## 2018-03-12 NOTE — Progress Notes (Signed)
Internal Medicine Clinic Attending  Case discussed with Dr. Chundi at the time of the visit.  We reviewed the resident's history and exam and pertinent patient test results.  I agree with the assessment, diagnosis, and plan of care documented in the resident's note. 

## 2018-03-13 ENCOUNTER — Emergency Department (HOSPITAL_COMMUNITY): Payer: Medicaid Other

## 2018-03-13 ENCOUNTER — Emergency Department (HOSPITAL_COMMUNITY)
Admission: EM | Admit: 2018-03-13 | Discharge: 2018-03-13 | Disposition: A | Discharge status: Home / Self Care | Payer: Medicaid Other | Attending: Emergency Medicine | Admitting: Emergency Medicine

## 2018-03-13 ENCOUNTER — Encounter (HOSPITAL_COMMUNITY): Payer: Self-pay | Admitting: Nurse Practitioner

## 2018-03-13 ENCOUNTER — Other Ambulatory Visit: Payer: Self-pay

## 2018-03-13 DIAGNOSIS — Z853 Personal history of malignant neoplasm of breast: Secondary | ICD-10-CM | POA: Diagnosis not present

## 2018-03-13 DIAGNOSIS — Z79899 Other long term (current) drug therapy: Secondary | ICD-10-CM | POA: Insufficient documentation

## 2018-03-13 DIAGNOSIS — Z87891 Personal history of nicotine dependence: Secondary | ICD-10-CM | POA: Diagnosis not present

## 2018-03-13 DIAGNOSIS — R509 Fever, unspecified: Secondary | ICD-10-CM | POA: Diagnosis present

## 2018-03-13 DIAGNOSIS — R0981 Nasal congestion: Secondary | ICD-10-CM | POA: Insufficient documentation

## 2018-03-13 DIAGNOSIS — R05 Cough: Secondary | ICD-10-CM | POA: Diagnosis not present

## 2018-03-13 DIAGNOSIS — J111 Influenza due to unidentified influenza virus with other respiratory manifestations: Secondary | ICD-10-CM | POA: Insufficient documentation

## 2018-03-13 DIAGNOSIS — R6889 Other general symptoms and signs: Secondary | ICD-10-CM

## 2018-03-13 MED ORDER — ALBUTEROL SULFATE HFA 108 (90 BASE) MCG/ACT IN AERS: 2.0000 | INHALATION_SPRAY | Freq: Once | RESPIRATORY_TRACT | Status: DC

## 2018-03-13 MED ORDER — AEROCHAMBER Z-STAT PLUS/MEDIUM MISC
Status: AC
  Administered 2018-03-13: 1
  Filled 2018-03-13: qty 1

## 2018-03-13 MED ORDER — IPRATROPIUM-ALBUTEROL 0.5-2.5 (3) MG/3ML IN SOLN
3.0000 mL | Freq: Once | RESPIRATORY_TRACT | Status: AC
  Administered 2018-03-13: 3 mL via RESPIRATORY_TRACT
  Filled 2018-03-13: qty 3

## 2018-03-13 MED ORDER — ALBUTEROL SULFATE HFA 108 (90 BASE) MCG/ACT IN AERS: 1.0000 | INHALATION_SPRAY | Freq: Four times a day (QID) | RESPIRATORY_TRACT | 0 refills | Status: DC | PRN

## 2018-03-13 NOTE — ED Provider Notes (Signed)
[facility] DEPT Provider Note   CSN: [id number] Arrival date & time: 03/13/18  0105     History   Chief Complaint Chief Complaint  Patient presents with  . URI  . Ca Pt    HPI ONDREA DOW is a 42 y.o. female.   42 y.o. with hx of ER+, PR-, HER2-, BRCA + Stage IIIA breast cancer (finished oral chemotherapy on 02/07/18) presents to the ED for persistent URI symptoms.  Reports onset of fever up to 102.4 F with nasal congestion, congested cough, body aches 48 hours ago.  Was seen by her primary care doctor on 03/11/2018 where she was diagnosed with presumptive influenza and started on Tamiflu.  The patient has taken 4 doses of Tamiflu with improvement to her body aches.  States that her fever has resolved.  Expresses concern over persistent chest congestion and rattling with persistent nonproductive cough.  She denies any dyspnea on exertion, though she has been breathing more shallow as deep breathing will cause her to cough.  Describes some chest discomfort with coughing only.  Has not had any hemoptysis, lightheadedness, vomiting.  No sick contacts.  Has been using DayQuil and Nyquil for congestion with little relief.  Expresses concern that she may also have bronchitis or PNA.     Past Medical History:  Diagnosis Date  . Breast cancer, left breast ([place]) dx'd 01/2017  . Complication of anesthesia   . Dermatologic problem    possible psoriasis. Pt has not had been diagnosed by dermatology.  . 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 GERD (gastroesophageal reflux disease)   . PONV (postoperative nausea and vomiting)   . Tobacco dependence    trying to quit with nicotene patches    Patient Active Problem List   Diagnosis Date Noted  . Viral upper respiratory infection 03/11/2018  . UTI (urinary tract infection) 04/24/2017  . 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  .  Genetic testing 03/05/2017  . Port catheter in place 01/30/2017  . Malignant neoplasm of upper-outer quadrant of left breast in female, estrogen receptor positive ([place]) 01/05/2017  . Tobacco use 12/12/2016    Past Surgical History:  Procedure Laterality Date  . BREAST BIOPSY Left 12/2016  . BREAST RECONSTRUCTION Bilateral 07/16/2017   BILATERAL BREAST RECONSTRUCTION WITH PLACEMENT OF TISSUE EXPANDER AND ALLODERM/notes 07/16/2017  . CESAREAN SECTION  2002  . MASTECTOMY Bilateral 07/16/2017   LEFT SKIN SPARING MASTECTOMY WITH LEFT RADIOACTIVE SEED TARGETED AXILLARY LYMPH NODE EXCISION AND AXILLARY SENTINEL LYMPH NODE BIOPSY; RIGHT PROPHYLACTIC SKIN SPARING MASTECTOMY [name] 07/16/2017  . MASTECTOMY WITH RADIOACTIVE SEED GUIDED EXCISION AND AXILLARY SENTINEL LYMPH NODE BIOPSY Left 07/16/2017   Procedure: LEFT SKIN SPARING MASTECTOMY WITH LEFT RADIOACTIVE SEED TARGETED AXILLARY LYMPH NODE EXCISION AND AXILLARY SENTINEL LYMPH NODE BIOPSY;  Surgeon: [name], MD;  Location: [facility];  Service: General;  Laterality: Left;  . NIPPLE SPARING MASTECTOMY Right 07/16/2017   Procedure: RIGHT PROPHYLACTIC SKIN SPARING MASTECTOMY;  Surgeon: [name], MD;  Location: [facility];  Service: General;  Laterality: Right;  . PORT-A-CATH REMOVAL N/A 07/16/2017   Procedure: REMOVAL PORT-A-CATH;  Surgeon: [name], MD;  Location: [facility];  Service: General;  Laterality: N/A;  . PORTA CATH REMOVAL  07/16/2017  . PORTACATH PLACEMENT N/A 01/14/2017   Procedure: INSERTION PORT-A-CATH WITH [place];  Surgeon: [name], MD;  Location: [facility];  Service: General;  Laterality: N/A;  . ROBOTIC  ASSISTED TOTAL HYSTERECTOMY WITH BILATERAL SALPINGO OOPHERECTOMY Bilateral 11/03/2017   Procedure: XI ROBOTIC ASSISTED TOTAL HYSTERECTOMY WITH BILATERAL SALPINGO OOPHORECTOMY;  Surgeon: [name], MD;  Location: [facility];  Service: Gynecology;  Laterality: Bilateral;     OB History    Gravida  1   Para      Term       Preterm      AB      Living  2     SAB      TAB      Ectopic      Multiple  1   Live Births  2            Home Medications    Prior to Admission medications   Medication Sig Start Date End Date Taking? Authorizing Provider  acetaminophen (TYLENOL) 500 MG tablet Take 1,000 mg by mouth every 6 (six) hours as needed for moderate pain.   Yes [provider]  citalopram (CELEXA) 10 MG tablet Take 1 tablet (10 mg total) by mouth daily. Patient taking differently: Take 10 mg by mouth at bedtime.  12/29/17  Yes [name], MD  famotidine (PEPCID) 10 MG tablet Take 10 mg by mouth daily as needed for heartburn or indigestion.   Yes [provider]  ibuprofen (ADVIL,MOTRIN) 200 MG tablet Take 200-600 mg by mouth daily as needed for headache or moderate pain.    Yes [provider]  Magnesium 250 MG TABS Take 250 mg by mouth daily.    Yes [provider]  oseltamivir (TAMIFLU) 75 MG capsule Take 1 capsule (75 mg total) by mouth 2 (two) times daily for 5 days. 03/11/18 03/16/18 Yes Chundi, Vahini, MD  albuterol (PROVENTIL HFA;VENTOLIN HFA) 108 (90 Base) MCG/ACT inhaler Inhale 1-2 puffs into the lungs every 6 (six) hours as needed for wheezing or shortness of breath. 03/13/18   Antonietta Breach, PA-C  nitrofurantoin, macrocrystal-monohydrate, (MACROBID) 100 MG capsule Take 1 capsule (100 mg total) by mouth 2 (two) times daily. Patient not taking: Reported on 03/13/2018 02/11/18   Alla Feeling, NP  oxyCODONE-acetaminophen (PERCOCET) 5-325 MG tablet Take 1-2 tablets by mouth every 6 (six) hours as needed for severe pain. Patient not taking: Reported on 02/11/2018 01/21/18   Mesner, Corene Cornea, MD  urea (CARMOL) 10 % cream Apply topically as needed. Patient taking differently: Apply 1 application topically as needed.  01/19/18   [name], MD    Family History Family History  Problem Relation Age of Onset  . Stroke Maternal Grandmother   . Stroke Father   .  Hypertension Father   . Other Father        Adopted    Social History Social History   Tobacco Use  . Smoking status: Former Smoker    Packs/day: 1.00    Years: 17.00    Pack years: 17.00    Types: Cigarettes    Last attempt to quit: 12/15/2016    Years since quitting: 1.2  . Smokeless tobacco: Never Used  Substance Use Topics  . Alcohol use: No  . Drug use: No     Allergies   Amoxicillin; Other; Chlorhexidine gluconate; and Nicotine   Review of Systems Review of Systems Ten systems reviewed and are negative for acute change, except as noted in the HPI.    Physical Exam Updated Vital Signs BP 109/66 (BP Location: Right Arm)   Pulse 90   Temp 98.9 F (37.2 C) (Oral)   Resp 20  Ht 5' 4"  (1.626 m)   Wt 88.9 kg   LMP 01/07/2017 (Approximate)   SpO2 100%   BMI 33.64 kg/m   Physical Exam  Constitutional: She is oriented to person, place, and time. She appears well-developed and well-nourished. No distress.  Nontoxic appearing and in NAD  HENT:  Head: Normocephalic and atraumatic.  Audible nasal congestion  Eyes: Conjunctivae and EOM are normal. No scleral icterus.  Neck: Normal range of motion.  Cardiovascular: Normal rate, regular rhythm and intact distal pulses.  Not tachycardic as noted in triage  Pulmonary/Chest: Effort normal. No stridor. No respiratory distress. She has no wheezes.  Faint rhonchi on expiration, worse in BLL. No tachypnea or dyspnea. Intermittent nonproductive, congested sounding cough.  Musculoskeletal: Normal range of motion.  Neurological: She is alert and oriented to person, place, and time. She exhibits normal muscle tone. Coordination normal.  Skin: Skin is warm and dry. No rash noted. She is not diaphoretic. No erythema. No pallor.  Psychiatric: She has a normal mood and affect. Her behavior is normal.  Nursing note and vitals reviewed.    ED Treatments / Results  Labs (all labs ordered are listed, but only abnormal results are  displayed) Labs Reviewed - No data to display  EKG None  Radiology Dg Chest 2 View  Result Date: 03/13/2018 CLINICAL DATA:  Productive cough.  Shortness of breath. EXAM: CHEST - 2 VIEW COMPARISON:  January 14, 2017 FINDINGS: The heart size and mediastinal contours are within normal limits. Both lungs are clear. The visualized skeletal structures are unremarkable. IMPRESSION: No active cardiopulmonary disease. Electronically Signed   By: Dorise Bullion III M.D   On: 03/13/2018 01:57    Procedures Procedures (including critical care time)  Medications Ordered in ED Medications  ipratropium-albuterol (DUONEB) 0.5-2.5 (3) MG/3ML nebulizer solution 3 mL (3 mLs Nebulization Given 03/13/18 0430)  aerochamber Z-Stat Plus/medium (1 each  Given 03/13/18 0459)    5:00 AM Patient with some improved air movement following DuoNeb.  Will discharge with albuterol inhaler for home use.  Counseled on the use of Claritin or Zyrtec as well as other over-the-counter decongestants.  Patient expresses comfort following up with her primary doctor.   Initial Impression / Assessment and Plan / ED Course  I have reviewed the triage vital signs and the nursing notes.  Pertinent labs & imaging results that were available during my care of the patient were reviewed by me and considered in my medical decision making (see chart for details).     42 year old female presents to the emergency department for evaluation of persistent nasal and chest congestion.  Was diagnosed with presumptive influenza 2 days ago and has been taking Tamiflu.  Overall, she feels that her symptoms have been improving.  She has rhonchorous breath sounds in her bilateral lung bases on expiration.  She was given a DuoNeb treatment in the emergency department with slight improvement.  Unfortunately, the patient needed to leave the ED to go home rather abruptly.  She expresses comfort with discharge and will provide prescription for albuterol  inhaler to use at home as needed.  Counseled on the use of decongestants.  Encourage primary care follow-up.  Return precautions discussed and provided. Patient discharged in stable condition with no unaddressed concerns.   Final Clinical Impressions(s) / ED Diagnoses   Final diagnoses:  Flu-like symptoms    ED Discharge Orders         Ordered    albuterol (PROVENTIL HFA;VENTOLIN HFA) 108 (90 Base)  MCG/ACT inhaler  Every 6 hours PRN     03/13/18 0458           Antonietta Breach, PA-C 29/52/84 1324    Delora Fuel, MD 40/10/27 [phone]

## 2018-03-13 NOTE — ED Triage Notes (Signed)
Pt is c/o cough and chest congestion and that she is taking tamiflu after being seen at the cancer center. States the flu-like symptoms have improved but the chest cold symptoms have not improved, she is concerned pneumonia or bronchitis.

## 2018-03-13 NOTE — Discharge Instructions (Signed)
We recommend the use of over-the-counter decongestants.  You may also benefit from Claritin or Zyrtec.  Use an albuterol inhaler, 2 puffs every 4-6 hours as needed for persistent wheezing or shortness of breath.  Follow-up with your primary care doctor.

## 2018-03-13 NOTE — ED Notes (Signed)
Pt given spacer.

## 2018-04-12 ENCOUNTER — Encounter: Payer: Self-pay | Admitting: Licensed Clinical Social Worker

## 2018-04-12 NOTE — Progress Notes (Signed)
[facility]  [phone] [phone]  Clinic Follow-up Note   Patient Care Team: Mosetta Anis, MD as PCP - Joylene Draft, MD as Consulting Physician (General Surgery) [name], MD as Consulting Physician (Hematology) [name], MD as Consulting Physician (Radiation Oncology)   Date of Service:  04/14/2018  CHIEF COMPLAINTS:  Follow-up left breast cancer   Oncology History   Cancer Staging Malignant neoplasm of upper-outer quadrant of left breast in female, estrogen receptor positive ([place]) Staging form: Breast, AJCC 8th Edition - Clinical stage from 12/29/2016: Stage IIIA (cT2, cN1, cM0, G3, ER: Positive, PR: Negative, HER2: Negative) - Signed by [name], MD on 01/07/2017 - Pathologic: No Stage Recommended (ypT1c, pN0, cM0, G2, ER-, PR-, HER2-) - Unsigned Cancer Staging Malignant neoplasm of upper-outer quadrant of left breast in female, estrogen receptor positive ([place]) Staging form: Breast, AJCC 8th Edition - Clinical stage from 12/29/2016: Stage IIIA (cT2, cN1, cM0, G3, ER: Positive, PR: Negative, HER2: Negative) - Signed by [name], MD on 01/07/2017 - Pathologic: No Stage Recommended (ypT1c, pN0, cM0, G2, ER-, PR-, HER2-) - Unsigned       Malignant neoplasm of upper-outer quadrant of left breast in female, estrogen receptor positive ([place])   12/25/2016 Mammogram    [place] and [facility] Diagnostic Breast Tomo Bilateral 12/25/16 IMPRESSION: 1. Suspicious mass 2.3 x 1.6 x 3.0 cm  in the 130 o'clock location of the left breast 8cm from the nipple, biopsy recommended. Adjacent simple cyst is 1.9 cm. 2. Suspicious left axillary lymph node 2.2cm for which biopsy is indicated. 3. Indeterminate group of calcifications in the upper central portion of the left breast for which biopsy is recommended.     12/29/2016 Initial Biopsy    Diagnosis 12/29/16 1. Breast, left, needle core biopsy, stereotactic, upper inner quadrant - FIBROCYSTIC CHANGES  INCLUDING APOCRINE METAPLASIA WITH CALCIFICATIONS - NO MALIGNANCY IDENTIFIED 2. Breast, left, needle core biopsy, ultrasound, 1:30 o'clock - INVASIVE MAMMARY CARCINOMA, G3 - SEE COMMENT 3. Lymph node, needle/core biopsy, ultrasound, left axillary - METASTIC CARCINOMA INVOLVING ONE LYMPH NODE (1/1) E-cadherin is positive supporting a ductal origin.     12/29/2016 Receptors her2    ER 15%+, weak staining  PR - HER2- Ki67 10%     12/29/2016 Initial Diagnosis    Malignant neoplasm of upper-outer quadrant of left breast in female, estrogen receptor positive ([place])    01/13/2017 Echocardiogram    ECHO 01/13/17 Study Conclusions - Left ventricle: The cavity size was normal. Systolic function was   normal. The estimated ejection fraction was in the range of 60%   to 65%. Wall motion was normal; there were no regional wall   motion abnormalities. Left ventricular diastolic function   parameters were normal. - Mitral valve: There was trivial regurgitation. - Right atrium: The atrium was mildly dilated. - Tricuspid valve: There was trivial regurgitation.     01/14/2017 Surgery    Port placement by Dr. Donne Hazel     01/15/2017 Imaging    Whole Body bone Scan 01/15/17 IMPRESSION: 1.  No significant abnormality identified.    01/15/2017 Imaging    CT CAP W Contrast 01/15/17 IMPRESSION: 1. Left lateral breast mass with pathologic left axillary adenopathy including a 2.0 cm left axillary lymph node. 2. Several hypodense lesions in the liver are likely benign lesions such as cysts. However, these are technically too small to characterize by CT. Possibilities for further workup include surveillance or hepatic protocol MRI with and without contrast. 3. There  is also a tiny hypodense lesion in the spleen which is technically nonspecific but statistically highly likely to be benign. 4.  Prominent stool throughout the colon favors constipation    01/16/2017 - 06/05/2017 Chemotherapy    Neoadjuvant  chemo AC every 2 weeks for 4 cycles starting on 01/16/2017 and ended 02/27/17. Then proceed with weekly taxol for 12 treatments starting 03/13/17. Added Carboplatin AUC 2 on 04/03/17 to weekly taxol. Due to symptomatic thrombocytopenia we held Carbo since cycle 8. Cycle 9 was post-poned due to neutropenia and Granix was added starting 05/12/17. Added carbo back at reduced dose AUC 1.5 with cycle 10 on 05/22/17. 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   Analysis also detected a Variant of Uncertain Significance (VUS) in the NBN gene called c.628G>T (p.Val210Phe)  Update: The Variant of Uncertain Significance NBN c.628G>T (p.Val210Phe) has been reclassified to "Likely Benign." Report date is 04/09/2018.     06/12/2017 Imaging    MR BREAST BILATERAL WO CONTRAST IMPRESSION: 1. Significant positive response to neoadjuvant chemotherapy. The index carcinoma in the left breast has significantly decreased in size. Left axillary adenopathy has also significantly improved. 2. No other evidence of malignancy.    07/16/2017 Surgery    Pt underwent a bilateral mastectomy and left SLN biopsy with Dr. Donne Hazel    07/16/2017 Pathology Results    Diagnosis 07/16/17 1. Breast, simple mastectomy, Right Prophylactic - FIBROCYSTIC CHANGES INCLUDING APOCRINE METAPLASIA - DUCT ECTASIA - CALCIFICATIONS - NO MALIGNANCY IDENTIFIED 2. Lymph node, sentinel, biopsy, Left Axillary - NO CARCINOMA IDENTIFIED IN ONE LYMPH NODE (0/1) - SEE COMMENT 3. Lymph node, sentinel, biopsy, Left - NO CARCINOMA IDENTIFIED IN ONE LYMPH NODE (0/1) - SEE COMMENT 4. Breast, simple mastectomy, Left - INVASIVE DUCTAL CARCINOMA, NOTTINGHAM GRADE 2 OF 3, 1.3 CM - MARGINS UNINVOLVED BY CARCINOMA (2 CM POSTERIOR MARGIN) - PREVIOUS BIOPSY SITE CHANGES - CALCIFICATIONS - SEE ONCOLOGY TABLE BELOW    07/16/2017  Receptors her2    Results: IMMUNOHISTOCHEMICAL AND MORPHOMETRIC ANALYSIS PERFORMED MANUALLY Estrogen Receptor: 0%, NEGATIVE Progesterone Receptor: 0%, NEGATIVE Results: HER2 - NEGATIVE RATIO OF HER2/CEP17 SIGNALS 1.73 AVERAGE HER2 COPY NUMBER PER CELL 2.67    09/02/2017 - 10/16/2017 Radiation Therapy    Adjuvant radiation per Dr. Lisbeth Clarkston     11/03/2017 Surgery    XI ROBOTIC ASSISTED TOTAL HYSTERECTOMY WITH BILATERAL SALPINGO OOPHORECTOMY  by Dr. Denman George and Dr. Delsa Sale 11/03/17    11/23/2017 - 02/08/2018 Chemotherapy    Adjuvant Xeloda 2053m BID 2 weeks on and 1 week off      HISTORY OF PRESENTING ILLNESS: 01/07/17 Kathryn AlbertsRenshaw 42y.o. female is here because of newly diagnosed Malignant neoplasm of upper-outer quadrant of left breast in female, estrogen receptor positive. She presents to the breast clinic today with her common law husband. She felt the lump herself 3 months ago. She felt pain like "stabbing" first. She then had a mammogram 12/25/16 and subsequent biopsy   In the past she was using smoking match and quit smoking [place] was 1 month ago.   Today she reports her breast pain still comes and goes. She has not noticed any new changes. She recently has seen PCP because of lump and does not have a GEditor, commissioningso she had not gotten a mammogram before. Lately has regular period. She has a weak cervix and her children were  born premature. She is currently working at [employer] and works on her feet all the time, working 5 days a week.   GYN HISTORY  Menarchal: 12 LMP: 12/10/16 Contraceptive: No HRT: NA GP: G1P2 - twins    CURRENT THERAPY: Surveillance  INTERVAL HISTORY:  Kathryn Stanley is here for a follow-up of left breast cancer. She saw NP Lacie on 02/11/2018, and was noted to still have red and dry hands and feet with intermittent numbness and tingling.  Today, she is here with her husband. She is doing well and feels like she has recovered well. Her  neuropathy is mild and intermittent. Her fatigue is improving. She is back to full-time work. She denies CP.     MEDICAL HISTORY:  Past Medical History:  Diagnosis Date  . Breast cancer, left breast ([place]) dx'd 01/2017  . Complication of anesthesia   . Dermatologic problem    possible psoriasis. Pt has not had been diagnosed by dermatology.  . 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 GERD (gastroesophageal reflux disease)   . PONV (postoperative nausea and vomiting)   . Tobacco dependence    trying to quit with nicotene patches    SURGICAL HISTORY: Past Surgical History:  Procedure Laterality Date  . BREAST BIOPSY Left 12/2016  . BREAST RECONSTRUCTION Bilateral 07/16/2017   BILATERAL BREAST RECONSTRUCTION WITH PLACEMENT OF TISSUE EXPANDER AND ALLODERM/notes 07/16/2017  . CESAREAN SECTION  2002  . MASTECTOMY Bilateral 07/16/2017   LEFT SKIN SPARING MASTECTOMY WITH LEFT RADIOACTIVE SEED TARGETED AXILLARY LYMPH NODE EXCISION AND AXILLARY SENTINEL LYMPH NODE BIOPSY; RIGHT PROPHYLACTIC SKIN SPARING MASTECTOMY [name] 07/16/2017  . MASTECTOMY WITH RADIOACTIVE SEED GUIDED EXCISION AND AXILLARY SENTINEL LYMPH NODE BIOPSY Left 07/16/2017   Procedure: LEFT SKIN SPARING MASTECTOMY WITH LEFT RADIOACTIVE SEED TARGETED AXILLARY LYMPH NODE EXCISION AND AXILLARY SENTINEL LYMPH NODE BIOPSY;  Surgeon: [name], MD;  Location: [facility];  Service: General;  Laterality: Left;  . NIPPLE SPARING MASTECTOMY Right 07/16/2017   Procedure: RIGHT PROPHYLACTIC SKIN SPARING MASTECTOMY;  Surgeon: [name], MD;  Location: [facility];  Service: General;  Laterality: Right;  . PORT-A-CATH REMOVAL N/A 07/16/2017   Procedure: REMOVAL PORT-A-CATH;  Surgeon: [name], MD;  Location: [facility];  Service: General;  Laterality: N/A;  . PORTA CATH REMOVAL  07/16/2017  . PORTACATH PLACEMENT N/A 01/14/2017   Procedure: INSERTION PORT-A-CATH WITH [place];   Surgeon: [name], MD;  Location: [facility];  Service: General;  Laterality: N/A;  . ROBOTIC ASSISTED TOTAL HYSTERECTOMY WITH BILATERAL SALPINGO OOPHERECTOMY Bilateral 11/03/2017   Procedure: XI ROBOTIC ASSISTED TOTAL HYSTERECTOMY WITH BILATERAL SALPINGO OOPHORECTOMY;  Surgeon: [name], MD;  Location: [facility];  Service: Gynecology;  Laterality: Bilateral;    SOCIAL HISTORY: Social History   Socioeconomic History  . Marital status: Married    Spouse name: Not on file  . Number of children: Not on file  . Years of education: Not on file  . Highest education level: Not on file  Occupational History  . Not on file  Social Needs  . Financial resource strain: Not on file  . Food insecurity:    Worry: Not on file    Inability: Not on file  . Transportation needs:    Medical: Not on file    Non-medical: Not on file  Tobacco Use  . Smoking status: Former Smoker    Packs/day: 1.00    Years: 17.00  Pack years: 17.00    Types: Cigarettes    Last attempt to quit: 12/15/2016    Years since quitting: 1.3  . Smokeless tobacco: Never Used  Substance and Sexual Activity  . Alcohol use: No  . Drug use: No  . Sexual activity: Yes    Birth control/protection: None  Lifestyle  . Physical activity:    Days per week: Not on file    Minutes per session: Not on file  . Stress: Not on file  Relationships  . Social connections:    Talks on phone: Not on file    Gets together: Not on file    Attends religious service: Not on file    Active member of club or organization: Not on file    Attends meetings of clubs or organizations: Not on file    Relationship status: Not on file  . Intimate partner violence:    Fear of current or ex partner: Not on file    Emotionally abused: Not on file    Physically abused: Not on file    Forced sexual activity: Not on file  Other Topics Concern  . Not on file  Social History Narrative  . Not on file    FAMILY HISTORY: Family History    Problem Relation Age of Onset  . Stroke Maternal Grandmother   . Stroke Father   . Hypertension Father   . Other Father        Adopted    ALLERGIES:  is allergic to amoxicillin; other; chlorhexidine gluconate; and nicotine.  MEDICATIONS:  Current Outpatient Medications  Medication Sig Dispense Refill  . famotidine (PEPCID) 10 MG tablet Take 10 mg by mouth daily as needed for heartburn or indigestion.     No current facility-administered medications for this visit.    REVIEW OF SYSTEMS:  Constitutional: Denies abnormal night sweats. (+) improved energy  Eyes: Denies blurriness of vision, double vision or watery eyes Ears, nose, mouth, throat, and face: Denies mucositis or sore throat Respiratory: Denies dyspnea or wheezes   Cardiovascular: Denies palpitation, chest discomfort  Gastrointestinal:  Denies nausea, heartburn.   Skin: Denies abnormal skin rashes  Lymphatics: Denies new lymphadenopathy or easy bruising Neurological: (+) mild neuropathy MSK: No new joint pain Behavioral/Psych: Mood is stable, no new changes  All other systems were reviewed with the patient and are negative.  PHYSICAL EXAMINATION:  ECOG PERFORMANCE STATUS:0   Vitals:   04/14/18 1540  BP: 132/82  Pulse: 75  Resp: 18  Temp: 98.4 F (36.9 C)  SpO2: 100%   Filed Weights   04/14/18 1540  Weight: 200 lb 12.8 oz (91.1 kg)     GENERAL:alert, no distress and comfortable SKIN: skin color, texture, turgor are normal, no rashes or significant lesions except skin erythema on her palms and bottom of feet, no skin peeling or cracks  EYES: normal, conjunctiva are pink and non-injected, sclera clear OROPHARYNX:no exudate, no erythema and lips, buccal mucosa, and tongue normal  NECK: supple, thyroid normal size, non-tender, without nodularity LYMPH:  no palpable lymphadenopathy in the cervical, axillary or inguinal LUNGS: clear to auscultation and percussion with normal breathing effort HEART: regular  rate & rhythm and no murmurs and no lower extremity edema ABDOMEN:abdomen soft, non-tender and normal bowel sounds (+) S/p BSO and hysterectomy: 3 abdominal surgical incisions healed well.  Musculoskeletal:no cyanosis of digits and no clubbing  PSYCH: alert & oriented x 3 with fluent speech NEURO: no focal motor/sensory deficits Breast: S/p bilateral mastectomy with  tissue expanders. surgical incision healed well with tissue expander (+) skin hyperpigmentation from radiation.   LABORATORY DATA:  I have reviewed the data as listed CBC Latest Ref Rng & Units 04/14/2018 03/11/2018 02/11/2018  WBC 4.0 - 10.5 K/uL 6.5 6.8 7.1  Hemoglobin 12.0 - 15.0 g/dL 12.7 12.2 8.6(L)  Hematocrit 36.0 - 46.0 % 37.4 34.3 24.3(L)  Platelets 150 - 400 K/uL 215 212 176    CMP Latest Ref Rng & Units 04/14/2018 02/11/2018 01/21/2018  Glucose 70 - 99 mg/dL 93 92 96  BUN 6 - 20 mg/dL 16 20 23(H)  Creatinine 0.44 - 1.00 mg/dL 0.97 0.93 0.79  Sodium 135 - 145 mmol/L 140 139 139  Potassium 3.5 - 5.1 mmol/L 3.7 3.6 3.6  Chloride 98 - 111 mmol/L 105 105 106  CO2 22 - 32 mmol/L 24 24 24   Calcium 8.9 - 10.3 mg/dL 9.3 9.3 9.2  Total Protein 6.5 - 8.1 g/dL 7.2 6.8 6.7  Total Bilirubin 0.3 - 1.2 mg/dL 0.3 1.0 0.8  Alkaline Phos 38 - 126 U/L 83 92 82  AST 15 - 41 U/L 25 24 25   ALT 0 - 44 U/L 38 35 35     PATHOLOGY  Diagnosis 11/03/17 Uterus, cervix and bilateral fallopian tubes, with ovaries - BENIGN PROLIFERATIVE ENDOMETRIUM. - BENIGN UNREMARKABLE CERVIX. - BENIGN FOLLICULAR CYSTS OF BILATERAL OVARIES. - BENIGN UNREMARKABLE BILATERAL FALLOPIAN TUBES. - NEGATIVE FOR HYPERPLASIA OR MALIGNANCY.  Cytology Diagnosis 11/03/17 PERITONEAL WASHING(SPECIMEN 1 OF 1 COLLECTED 11/03/17): REACTIVE MESOTHELIAL CELLS PRESENT.   Diagnosis 12/29/16 1. Breast, left, needle core biopsy, stereotactic, upper inner quadrant - FIBROCYSTIC CHANGES INCLUDING APOCRINE METAPLASIA WITH CALCIFICATIONS - NO MALIGNANCY IDENTIFIED 2. Breast,  left, needle core biopsy, ultrasound, 1:30 o'clock - INVASIVE MAMMARY CARCINOMA - SEE COMMENT 3. Lymph node, needle/core biopsy, ultrasound, left axillary - METASTIC CARCINOMA INVOLVING ONE LYMPH NODE (1/1) Microscopic Comment 2. The biopsy material shows an infiltrative proliferation of cells with large vesicular nuclei with conspicuous nucleoli, arranged linearly and in small clusters. There are scattered cells with marked pleomorphism and cytologic atypia. Based on the biopsy, the carcinoma appears Nottingham grade 3 of 3 and measures 0.9 cm in greatest linear extent. E-cadherin and prognostic markers (ER/PR/ki-67/HER2-FISH)are pending and will be reported in an addendum. Dr. Tresa Moore reviewed the case and agrees with the above diagnosis. This case was called to [facility] on December 30, 2016. Results: IMMUNOHISTOCHEMICAL AND MORPHOMETRIC ANALYSIS PERFORMED MANUALLY Estrogen Receptor: 15%, POSITIVE, WEAK STAINING INTENSITY Progesterone Receptor: 0%, NEGATIVE Proliferation Marker Ki67: 10% COMMENT: The negative hormone receptor study(ies) in this case has An internal positive control. Results: HER2 - NEGATIVE RATIO OF HER2/CEP17 SIGNALS 1.63 AVERAGE HER2 COPY NUMBER PER CELL 3.10  Diagnosis 07/16/17 1. Breast, simple mastectomy, Right Prophylactic - FIBROCYSTIC CHANGES INCLUDING APOCRINE METAPLASIA - DUCT ECTASIA - CALCIFICATIONS - NO MALIGNANCY IDENTIFIED 2. Lymph node, sentinel, biopsy, Left Axillary - NO CARCINOMA IDENTIFIED IN ONE LYMPH NODE (0/1) - SEE COMMENT 3. Lymph node, sentinel, biopsy, Left - NO CARCINOMA IDENTIFIED IN ONE LYMPH NODE (0/1) - SEE COMMENT 4. Breast, simple mastectomy, Left - INVASIVE DUCTAL CARCINOMA, NOTTINGHAM GRADE 2 OF 3, 1.3 CM - MARGINS UNINVOLVED BY CARCINOMA (2 CM POSTERIOR MARGIN) - PREVIOUS BIOPSY SITE CHANGES - CALCIFICATIONS - SEE ONCOLOGY TABLE BELOW Microscopic Comment 2. and 3. Cytokeratin AE1/3 was performed on the  sentinel lymph nodes to exclude micrometastasis. There is no evidence of metastatic carcinoma by immunohistochemistry. 4. BREAST, STATUS POST NEOADJUVANT TREATMENT Procedure: Total mastectomy Laterality:  Left Tumor Size: 1.3 cm (glass slide measurement) Histologic Type: Invasive carcinoma of no special type (ductal, not otherwise specified) Grade: Nottingham Grade 2 Tubular Differentiation: 3 Nuclear Pleomorphism: 3 Mitotic Count: 1 Ductal Carcinoma in Situ (DCIS): Not identified Regional Lymph Nodes: Number of Lymph Nodes Examined: 2 Number of Sentinel Lymph Nodes Examined: 2 Lymph Nodes with Macrometastases: 0 Lymph Nodes with Micrometastases: 0 Lymph Nodes with Isolated Tumor Cells: 0 Margins: Uninvolved by invasive carcinoma Invasive carcinoma, distance from closest margin: 2 cm (posterior/deep) DCIS, distance from closest margin: N/A Breast Prognostic Profile (pre-neoadjuvant case #: [id number]) Estrogen Receptor: Positive (15%, weak) Progesterone Receptor: Negative Her2: Negative (Ratio 1.63) Ki-67: 10% Will be repeated on the current case (Block #: [id number]) and the results reported separately. Residual Cancer Burden (RCB): Primary Tumor Bed: 13 mm x 10 mm Overall Cancer Cellularity: 20% Percentage of Cancer that is in Situ: N/A Number of Positive Lymph Nodes: 0 Diameter of Largest Lymph Node metastasis: N/A Residual Cancer Burden : 1.611 Residual Cancer Burden Class: RCB-II Pathologic Stage Classification (p TNM, AJCC 8th Edition): Primary Tumor: ypT1c Regional Lymph Nodes: ypN0 COMMENT: E-cadherin immunohistochemistry was performed on the biopsy material and was positive supporting a ductal origin. Results: IMMUNOHISTOCHEMICAL AND MORPHOMETRIC ANALYSIS PERFORMED MANUALLY Estrogen Receptor: 0%, NEGATIVE Progesterone Receptor: 0%, NEGATIVE Results: HER2 - NEGATIVE RATIO OF HER2/CEP17 SIGNALS 1.73 AVERAGE HER2 COPY NUMBER PER CELL 2.67  PROCEDURES  04/17/2017  ECHO LV EF: 60% -   65%  ECHO 01/13/17 Study Conclusions - Left ventricle: The cavity size was normal. Systolic function was   normal. The estimated ejection fraction was in the range of 60%   to 65%. Wall motion was normal; there were no regional wall   motion abnormalities. Left ventricular diastolic function   parameters were normal. - Mitral valve: There was trivial regurgitation. - Right atrium: The atrium was mildly dilated. - Tricuspid valve: There was trivial regurgitation.  RADIOGRAPHIC STUDIES: I have personally reviewed the radiological images as listed and agreed with the findings in the report. No results found.   08/03/2017 US Pelvis IMPRESSION: No focal sonographic abnormalities identified.  ASSESSMENT & PLAN:  Kathryn Stanley is a 42 y.o. premenopausal female with no significant past medical history, presented with a palpable left breast mass.  1. Malignant neoplasm of upper-outer quadrant of left breast in female, ductal carcinoma, cT2N1M0, Stage IIIa, ER weakly +, PR - , HER2 - , Grade 3, ypT1cN0M0, triple negative  -We previously reviewed her mammogram and initial biopsy results in details with patient and her husband.  -Her tumor is weakly ER positive, PR negative, HER-2 negative, grade 3, she has positive lymph nodes, those are all high risks for recurrence. I recommend neoadjuvant or adjuvant chemotherapy to reduce her risk of recurrence after surgery. -We previously reviewed her staging scan CT from 01/15/17 in person. A few hypodense lesion were seen in the liver, appear to be benign cysts. CT and bone scan otherwise negative for distant metastasis. 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 This was discussed with patient. -Patient underwent neoadjuvant chemotherapy with dose dense AC every 2 weeks for 4 cycles, and weekly carbo and Taxol for 12 weeks, 01/16/17-06/05/17.  -Pt underwent a bilateral mastectomy with Dr. Donne Hazel on 07/16/17. Her  pathology results reveal a 1.3 tumor removed with all clean margins. There were no positive lymph nodes for carcinoma. Her receptors indicate triple negative. -her tumor was weakly ER positive on initial biopsy, and became triple negative after neoadjuvant chemotherapy.  I think the benefit of antiestrogen therapy is very small and I do not recommend it.   -She underwent radiation with Dr. Lisbeth Protzman 09/02/17-10/16/17. Recovered well.  -She completed total hysterectomy and BSO on 11/03/17, pathology was benign. She has recovered well mostly.   -she completed adjuvant chemo Xeloda for 3 months.  -She is interested in the clinical trial for adjuvant immunotherapy Keytruda in triple negative cancer. She was screened but not eligable for the clinical trial.  -She is clinically doing very well, has recovered well overall.  And was unremarkable.  No current concern for recurrence. -Labs reviewed, Hg at 11, CMP WNL.  -She will attend survivorship clinic in 3-4 months  -I educated her about recurrence symptoms including weight loss, new lumps, loss of appetite. She knows to call for any concerns. -I advised her to eat healthy food and exercise  -She is scheduled to have bilateral implant reconstruction in January 2020. -F/u in 6 months    2. Genetics: BRCA1 + -Due to her young age of diagnosis I suggested she get genetic testing. 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  -She underwent mastectomy with Dr. Donne Hazel on 07/16/17 and will have  Reconstruction with plastic surgeon Dr. Ashley Mariner.  -She underwent total hysterectomy and BSO with Dr. Denman George on 11/03/17.  -I previously explained when her children get older they need to get tested and start cancer screenings earlier.   3. Mood swing   -Secondary to BSO. -I previously recommended her to try Celexa, starting energy low-dose 10 mg daily. Potential benefit and side effects reviewed with her, she agreed to try.  -Celexa has helped, her mood is back to normal and hot flashes improved. Will continue     PLAN:  -She is clinically doing well, will continue surveillance  -survivorship in 3-4 months -f/u in 6 months    All questions were answered. The patient knows to call the clinic with any problems, questions or concerns. I spent 20 minutes counseling the patient face to face. The total time spent in the appointment was 25 minutes and more than 50% was on counseling.  Dierdre Searles Dweik am acting as scribe for Dr. [name].  I have reviewed the above documentation for accuracy and completeness, and I agree with the above.    [name]  04/14/2018

## 2018-04-12 NOTE — Progress Notes (Signed)
Update: The Variant of Uncertain Significance NBN c.628G>T (p.[id number]) has been reclassified to "Likely Benign." Report date is 04/09/2018.

## 2018-04-14 ENCOUNTER — Inpatient Hospital Stay: Payer: Medicaid Other | Attending: Hematology

## 2018-04-14 ENCOUNTER — Encounter: Payer: Self-pay | Admitting: Hematology

## 2018-04-14 ENCOUNTER — Inpatient Hospital Stay (HOSPITAL_BASED_OUTPATIENT_CLINIC_OR_DEPARTMENT_OTHER): Payer: Medicaid Other | Admitting: Hematology

## 2018-04-14 ENCOUNTER — Telehealth: Payer: Self-pay | Admitting: Hematology

## 2018-04-14 VITALS — BP 132/82 | HR 75 | Temp 98.4°F | Resp 18 | Ht 64.0 in | Wt 200.8 lb

## 2018-04-14 DIAGNOSIS — G629 Polyneuropathy, unspecified: Secondary | ICD-10-CM

## 2018-04-14 DIAGNOSIS — Z9013 Acquired absence of bilateral breasts and nipples: Secondary | ICD-10-CM | POA: Diagnosis not present

## 2018-04-14 DIAGNOSIS — Z9221 Personal history of antineoplastic chemotherapy: Secondary | ICD-10-CM

## 2018-04-14 DIAGNOSIS — Z87891 Personal history of nicotine dependence: Secondary | ICD-10-CM | POA: Diagnosis not present

## 2018-04-14 DIAGNOSIS — Z1501 Genetic susceptibility to malignant neoplasm of breast: Secondary | ICD-10-CM | POA: Insufficient documentation

## 2018-04-14 DIAGNOSIS — C50412 Malignant neoplasm of upper-outer quadrant of left female breast: Secondary | ICD-10-CM

## 2018-04-14 DIAGNOSIS — Z170 Estrogen receptor positive status [ER+]: Secondary | ICD-10-CM

## 2018-04-14 DIAGNOSIS — Z171 Estrogen receptor negative status [ER-]: Secondary | ICD-10-CM | POA: Insufficient documentation

## 2018-04-14 DIAGNOSIS — Z23 Encounter for immunization: Secondary | ICD-10-CM

## 2018-04-14 LAB — CBC WITH DIFFERENTIAL (CANCER CENTER ONLY)
ABS IMMATURE GRANULOCYTES: 0.01 10*3/uL (ref 0.00–0.07)
Basophils Absolute: 0 10*3/uL (ref 0.0–0.1)
Basophils Relative: 1 %
EOS ABS: 0.1 10*3/uL (ref 0.0–0.5)
Eosinophils Relative: 2 %
HEMATOCRIT: 37.4 % (ref 36.0–46.0)
HEMOGLOBIN: 12.7 g/dL (ref 12.0–15.0)
IMMATURE GRANULOCYTES: 0 %
LYMPHS ABS: 1.6 10*3/uL (ref 0.7–4.0)
LYMPHS PCT: 25 %
MCH: 30.3 pg (ref 26.0–34.0)
MCHC: 34 g/dL (ref 30.0–36.0)
MCV: 89.3 fL (ref 80.0–100.0)
MONOS PCT: 7 %
Monocytes Absolute: 0.4 10*3/uL (ref 0.1–1.0)
NEUTROS PCT: 65 %
Neutro Abs: 4.3 10*3/uL (ref 1.7–7.7)
Platelet Count: 215 10*3/uL (ref 150–400)
RBC: 4.19 MIL/uL (ref 3.87–5.11)
RDW: 11.9 % (ref 11.5–15.5)
WBC Count: 6.5 10*3/uL (ref 4.0–10.5)
nRBC: 0 % (ref 0.0–0.2)

## 2018-04-14 LAB — CMP (CANCER CENTER ONLY)
ALBUMIN: 4 g/dL (ref 3.5–5.0)
ALT: 38 U/L (ref 0–44)
ANION GAP: 11 (ref 5–15)
AST: 25 U/L (ref 15–41)
Alkaline Phosphatase: 83 U/L (ref 38–126)
BUN: 16 mg/dL (ref 6–20)
CHLORIDE: 105 mmol/L (ref 98–111)
CO2: 24 mmol/L (ref 22–32)
Calcium: 9.3 mg/dL (ref 8.9–10.3)
Creatinine: 0.97 mg/dL (ref 0.44–1.00)
GFR, Est AFR Am: >60 mL/min (ref >60)
GFR, Estimated: >60 mL/min (ref >60)
GLUCOSE: 93 mg/dL (ref 70–99)
Potassium: 3.7 mmol/L (ref 3.5–5.1)
SODIUM: 140 mmol/L (ref 135–145)
Total Bilirubin: 0.3 mg/dL (ref 0.3–1.2)
Total Protein: 7.2 g/dL (ref 6.5–8.1)

## 2018-04-14 MED ORDER — INFLUENZA VAC SPLIT QUAD 0.5 ML IM SUSY
0.5000 mL | PREFILLED_SYRINGE | Freq: Once | INTRAMUSCULAR | Status: AC
  Administered 2018-04-14: 0.5 mL via INTRAMUSCULAR

## 2018-04-14 MED ORDER — INFLUENZA VAC SPLIT QUAD 0.5 ML IM SUSY
PREFILLED_SYRINGE | INTRAMUSCULAR | Status: AC
  Filled 2018-04-14: qty 0.5

## 2018-04-14 NOTE — Telephone Encounter (Signed)
Appts scheduled letter/calendar mailed per 11/6 los

## 2018-05-24 IMAGING — NM NM BONE WHOLE BODY
2 series · 2 of 2 positions shown · non-contrast
Comparison: CT scan dated 01/15/2017

CLINICAL DATA: New diagnosis of left-sided breast cancer with
axillary lymph adenopathy.

EXAM:
NUCLEAR MEDICINE WHOLE BODY BONE SCAN
TECHNIQUE: Whole body anterior and posterior images were obtained approximately
3 hours after intravenous injection of radiopharmaceutical.
RADIOPHARMACEUTICALS:  21.4 mCi 2echnetium-IIm MDP IV

[Series 1: wbr_bone_40 whole body · 2.66mm/px · 1 of 1 slices shown (1 of 2)]
[im 1/1]
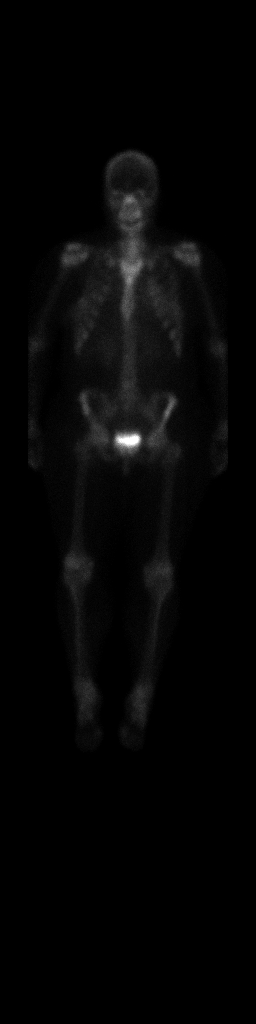

[Series 1: wbr_bone_40 whole body · 2.66mm/px · 1 of 1 slices shown (2 of 2)]
[im 1/1]
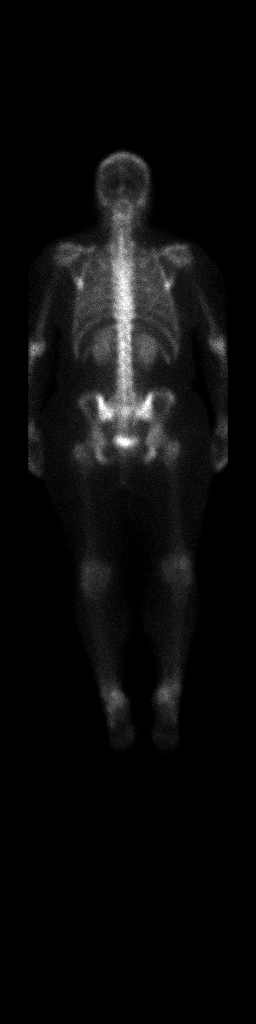

[2 of 2 positions shown; findings below may reference images not displayed]

FINDINGS: No significant abnormal focal activity suggestive of metastatic
disease to the skeleton. Overall normal distribution and appearance
of skeletal activity.
IMPRESSION: 1.  No significant abnormality identified.

## 2018-06-10 ENCOUNTER — Telehealth: Payer: Self-pay | Admitting: Hematology

## 2018-06-10 NOTE — Telephone Encounter (Signed)
Tried to reach regarding cancellation will reschedule upon lacie return in April

## 2018-06-17 NOTE — Pre-Procedure Instructions (Signed)
Kathryn Stanley  06/17/2018      [place], [place] - [address] [address] Phone: [phone] Fax: [phone]    Your procedure is scheduled on June 25, 2018  Report to [facility] at [place].M.  Call this number if you have problems the morning of surgery:  [phone]   Remember:  Do not eat or drink after midnight.    Do not wear jewelry, make-up or nail polish.  Do not wear lotions, powders, or perfumes, or deodorant.  Do not shave 48 hours prior to surgery.  Do not bring valuables to the hospital.  [facility] is not responsible for any belongings or valuables.  7 days prior to surgery STOP taking any Aspirin (unless otherwise instructed by your surgeon), Aleve, Naproxen, Ibuprofen, Motrin, Advil, Goody's, BC's, all herbal medications, fish oil, and all vitamins.  Contacts, dentures or bridgework may not be worn into surgery.  Leave your suitcase in the car.  After surgery it may be brought to your room.  For patients admitted to the hospital, discharge time will be determined by your treatment team.  Patients discharged the day of surgery will not be allowed to drive home.   [facility]- Preparing For Surgery  Before surgery, you can play an important role. Because skin is not sterile, your skin needs to be as free of germs as possible. You can reduce the number of germs on your skin by washing with CHG (chlorahexidine gluconate) Soap before surgery.  CHG is an antiseptic cleaner which kills germs and bonds with the skin to continue killing germs even after washing.    Oral Hygiene is also important to reduce your risk of infection.  Remember - BRUSH YOUR TEETH THE MORNING OF SURGERY WITH YOUR REGULAR TOOTHPASTE  Please do not use if you have an allergy to CHG or antibacterial soaps. If your skin becomes reddened/irritated stop using the CHG.  Do not shave (including  legs and underarms) for at least 48 hours prior to first CHG shower. It is OK to shave your face.  Please follow these instructions carefully.   1. Shower the NIGHT BEFORE SURGERY and the MORNING OF SURGERY with CHG.   2. If you chose to wash your hair, wash your hair first as usual with your normal shampoo.  3. After you shampoo, rinse your hair and body thoroughly to remove the shampoo.  4. Use CHG as you would any other liquid soap. You can apply CHG directly to the skin and wash gently with a scrungie or a clean washcloth.   5. Apply the CHG Soap to your body ONLY FROM THE NECK DOWN.  Do not use on open wounds or open sores. Avoid contact with your eyes, ears, mouth and genitals (private parts). Wash Face and genitals (private parts)  with your normal soap.  6. Wash thoroughly, paying special attention to the area where your surgery will be performed.  7. Thoroughly rinse your body with warm water from the neck down.  8. DO NOT shower/wash with your normal soap after using and rinsing off the CHG Soap.  9. Pat yourself dry with a CLEAN TOWEL.  10. Wear CLEAN PAJAMAS to bed the night before surgery, wear comfortable clothes the morning of surgery  11. Place CLEAN SHEETS on your bed the night of your first shower and DO NOT SLEEP WITH PETS.  Day of Surgery: Do  not apply any deodorants/lotions.  Please wear clean clothes to the hospital/surgery center.   Remember to brush your teeth WITH YOUR REGULAR TOOTHPASTE.

## 2018-06-18 ENCOUNTER — Encounter (HOSPITAL_COMMUNITY): Payer: Self-pay

## 2018-06-18 ENCOUNTER — Encounter (HOSPITAL_COMMUNITY)
Admission: RE | Admit: 2018-06-18 | Discharge: 2018-06-18 | Disposition: A | Discharge status: Home / Self Care | Payer: Medicaid Other | Source: Ambulatory Visit | Attending: Plastic Surgery | Admitting: Plastic Surgery

## 2018-06-18 ENCOUNTER — Other Ambulatory Visit: Payer: Self-pay | Admitting: Plastic Surgery

## 2018-06-18 ENCOUNTER — Other Ambulatory Visit: Payer: Self-pay

## 2018-06-18 DIAGNOSIS — Z01812 Encounter for preprocedural laboratory examination: Secondary | ICD-10-CM | POA: Diagnosis present

## 2018-06-18 LAB — CBC WITH DIFFERENTIAL/PLATELET
Abs Immature Granulocytes: 0.02 10*3/uL (ref 0.00–0.07)
BASOS PCT: 0 %
Basophils Absolute: 0 10*3/uL (ref 0.0–0.1)
EOS ABS: 0.2 10*3/uL (ref 0.0–0.5)
EOS PCT: 3 %
HEMATOCRIT: 42.8 % (ref 36.0–46.0)
Hemoglobin: 13.9 g/dL (ref 12.0–15.0)
IMMATURE GRANULOCYTES: 0 %
LYMPHS ABS: 1.7 10*3/uL (ref 0.7–4.0)
Lymphocytes Relative: 24 %
MCH: 27.3 pg (ref 26.0–34.0)
MCHC: 32.5 g/dL (ref 30.0–36.0)
MCV: 84.1 fL (ref 80.0–100.0)
MONOS PCT: 9 %
Monocytes Absolute: 0.7 10*3/uL (ref 0.1–1.0)
NRBC: 0 % (ref 0.0–0.2)
Neutro Abs: 4.7 10*3/uL (ref 1.7–7.7)
Neutrophils Relative %: 64 %
PLATELETS: 232 10*3/uL (ref 150–400)
RBC: 5.09 MIL/uL (ref 3.87–5.11)
RDW: 13 % (ref 11.5–15.5)
WBC: 7.3 10*3/uL (ref 4.0–10.5)

## 2018-06-18 LAB — BASIC METABOLIC PANEL
Anion gap: 11 (ref 5–15)
BUN: 21 mg/dL — ABNORMAL HIGH (ref 6–20)
CALCIUM: 9.5 mg/dL (ref 8.9–10.3)
CO2: 22 mmol/L (ref 22–32)
CREATININE: 0.95 mg/dL (ref 0.44–1.00)
Chloride: 106 mmol/L (ref 98–111)
GFR calc Af Amer: >60 mL/min (ref >60)
GLUCOSE: 78 mg/dL (ref 70–99)
Potassium: 3.8 mmol/L (ref 3.5–5.1)
Sodium: 139 mmol/L (ref 135–145)

## 2018-06-18 NOTE — Progress Notes (Addendum)
PCP - Internal Medicine   (ground floor of [facility])  Cardiologist - no  Chest x-ray - 03/2018 EKG -  Stress Test -   ECHO - 04/2017   Cardiac Cath - N/A  Sleep Study - N/A CPAP -   Fasting Blood Sugar - No  Checks Blood Sugar _____ times a day  Blood Thinner Instructions: Aspirin Instructions:  Anesthesia review:   Patient denies shortness of breath, fever, cough and chest pain at PAT appointment   Patient verbalized understanding of instructions that were given to them at the PAT appointment. Patient was also instructed that they will need to review over the PAT instructions again at home before surgery.  She gets a rash from the chlorhexadine.  I instructed her to use anti=bacterial soap, Dial.

## 2018-06-24 NOTE — Anesthesia Preprocedure Evaluation (Addendum)
Anesthesia Evaluation  Patient identified by MRN, date of birth, ID band Patient awake    Reviewed: Allergy & Precautions, NPO status , Patient's Chart, lab work & pertinent test results  History of Anesthesia Complications (+) PONV and history of anesthetic complications  Airway Mallampati: II  TM Distance: >3 FB Neck ROM: Full    Dental  (+) Chipped,    Pulmonary former smoker,    Pulmonary exam normal breath sounds clear to auscultation       Cardiovascular negative cardio ROS Normal cardiovascular exam Rhythm:Regular Rate:Normal     Neuro/Psych negative neurological ROS  negative psych ROS   GI/Hepatic Neg liver ROS, GERD  Medicated and Controlled,  Endo/Other  negative endocrine ROS  Renal/GU negative Renal ROS     Musculoskeletal negative musculoskeletal ROS (+)   Abdominal (+) + obese,   Peds  Hematology negative hematology ROS (+)   Anesthesia Other Findings history left breast cancer, acquired absence breasts, history therapeutic radiation  Reproductive/Obstetrics                            Anesthesia Physical Anesthesia Plan  ASA: II  Anesthesia Plan: General   Post-op Pain Management:    Induction: Intravenous  PONV Risk Score and Plan: 4 or greater and Scopolamine patch - Pre-op, Midazolam, Dexamethasone, Ondansetron and Treatment may vary due to age or medical condition  Airway Management Planned: Oral ETT  Additional Equipment:   Intra-op Plan:   Post-operative Plan: Extubation in OR  Informed Consent: I have reviewed the patients History and Physical, chart, labs and discussed the procedure including the risks, benefits and alternatives for the proposed anesthesia with the patient or authorized representative who has indicated his/her understanding and acceptance.     Dental advisory given  Plan Discussed with: CRNA  Anesthesia Plan Comments:          Anesthesia Quick Evaluation

## 2018-06-25 ENCOUNTER — Ambulatory Visit (HOSPITAL_COMMUNITY): Payer: Medicaid Other | Admitting: Anesthesiology

## 2018-06-25 ENCOUNTER — Encounter (HOSPITAL_COMMUNITY)
Admission: RE | Disposition: A | Discharge status: Home / Self Care | Payer: Self-pay | Source: Ambulatory Visit | Attending: Plastic Surgery

## 2018-06-25 ENCOUNTER — Other Ambulatory Visit: Payer: Self-pay

## 2018-06-25 ENCOUNTER — Observation Stay (HOSPITAL_COMMUNITY)
Admission: RE | Admit: 2018-06-25 | Discharge: 2018-06-26 | Disposition: A | Discharge status: Home / Self Care | Payer: Medicaid Other | Source: Ambulatory Visit | Attending: Plastic Surgery | Admitting: Plastic Surgery

## 2018-06-25 ENCOUNTER — Encounter (HOSPITAL_COMMUNITY): Payer: Self-pay | Admitting: *Deleted

## 2018-06-25 DIAGNOSIS — Z170 Estrogen receptor positive status [ER+]: Secondary | ICD-10-CM | POA: Diagnosis not present

## 2018-06-25 DIAGNOSIS — Z9013 Acquired absence of bilateral breasts and nipples: Secondary | ICD-10-CM | POA: Diagnosis not present

## 2018-06-25 DIAGNOSIS — Z880 Allergy status to penicillin: Secondary | ICD-10-CM | POA: Insufficient documentation

## 2018-06-25 DIAGNOSIS — Z87891 Personal history of nicotine dependence: Secondary | ICD-10-CM | POA: Diagnosis not present

## 2018-06-25 DIAGNOSIS — Z923 Personal history of irradiation: Secondary | ICD-10-CM | POA: Insufficient documentation

## 2018-06-25 DIAGNOSIS — E669 Obesity, unspecified: Secondary | ICD-10-CM | POA: Insufficient documentation

## 2018-06-25 DIAGNOSIS — Z884 Allergy status to anesthetic agent status: Secondary | ICD-10-CM | POA: Diagnosis not present

## 2018-06-25 DIAGNOSIS — Z853 Personal history of malignant neoplasm of breast: Secondary | ICD-10-CM | POA: Insufficient documentation

## 2018-06-25 DIAGNOSIS — K219 Gastro-esophageal reflux disease without esophagitis: Secondary | ICD-10-CM | POA: Insufficient documentation

## 2018-06-25 DIAGNOSIS — Z421 Encounter for breast reconstruction following mastectomy: Principal | ICD-10-CM | POA: Insufficient documentation

## 2018-06-25 DIAGNOSIS — D225 Melanocytic nevi of trunk: Secondary | ICD-10-CM | POA: Diagnosis not present

## 2018-06-25 DIAGNOSIS — Z791 Long term (current) use of non-steroidal anti-inflammatories (NSAID): Secondary | ICD-10-CM | POA: Diagnosis not present

## 2018-06-25 DIAGNOSIS — Z9221 Personal history of antineoplastic chemotherapy: Secondary | ICD-10-CM | POA: Diagnosis not present

## 2018-06-25 HISTORY — PX: BREAST RECONSTRUCTION: SHX9

## 2018-06-25 HISTORY — PX: REMOVAL OF BILATERAL TISSUE EXPANDERS WITH PLACEMENT OF BILATERAL BREAST IMPLANTS: SHX6431

## 2018-06-25 HISTORY — PX: APPLICATION OF A-CELL OF CHEST/ABDOMEN: SHX6302

## 2018-06-25 HISTORY — PX: LATISSIMUS FLAP TO BREAST: SHX5357

## 2018-06-25 SURGERY — REMOVAL, TISSUE EXPANDER, BREAST, BILATERAL, WITH BILATERAL IMPLANT IMPLANT INSERTION
Anesthesia: General | Site: Chest | Laterality: Right

## 2018-06-25 MED ORDER — DEXAMETHASONE SODIUM PHOSPHATE 10 MG/ML IJ SOLN
INTRAMUSCULAR | Status: DC | PRN
  Administered 2018-06-25: 10 mg via INTRAVENOUS

## 2018-06-25 MED ORDER — CELECOXIB 200 MG PO CAPS
200.0000 mg | ORAL_CAPSULE | ORAL | Status: AC
  Administered 2018-06-25: 200 mg via ORAL
  Filled 2018-06-25: qty 1

## 2018-06-25 MED ORDER — SUGAMMADEX SODIUM 200 MG/2ML IV SOLN
INTRAVENOUS | Status: DC | PRN
  Administered 2018-06-25: 200 mg via INTRAVENOUS

## 2018-06-25 MED ORDER — MIDAZOLAM HCL 2 MG/2ML IJ SOLN
INTRAMUSCULAR | Status: AC
  Filled 2018-06-25: qty 2

## 2018-06-25 MED ORDER — ACETAMINOPHEN 500 MG PO TABS
1000.0000 mg | ORAL_TABLET | ORAL | Status: AC
  Administered 2018-06-25: 1000 mg via ORAL
  Filled 2018-06-25: qty 2

## 2018-06-25 MED ORDER — ENOXAPARIN SODIUM 40 MG/0.4ML ~~LOC~~ SOLN
40.0000 mg | SUBCUTANEOUS | Status: DC
  Administered 2018-06-26: 40 mg via SUBCUTANEOUS
  Filled 2018-06-25: qty 0.4

## 2018-06-25 MED ORDER — KCL IN DEXTROSE-NACL 20-5-0.45 MEQ/L-%-% IV SOLN
INTRAVENOUS | Status: DC
  Administered 2018-06-25: 16:00:00 via INTRAVENOUS
  Filled 2018-06-25: qty 1000

## 2018-06-25 MED ORDER — SODIUM CHLORIDE 0.9 % IV SOLN
INTRAVENOUS | Status: AC
  Filled 2018-06-25: qty 500000

## 2018-06-25 MED ORDER — LIDOCAINE 2% (20 MG/ML) 5 ML SYRINGE
INTRAMUSCULAR | Status: DC | PRN
  Administered 2018-06-25: 60 mg via INTRAVENOUS

## 2018-06-25 MED ORDER — HYDROMORPHONE HCL 1 MG/ML IJ SOLN: 0.5000 mg | INTRAMUSCULAR | Status: DC | PRN

## 2018-06-25 MED ORDER — HEPARIN SODIUM (PORCINE) 5000 UNIT/ML IJ SOLN
5000.0000 [IU] | Freq: Once | INTRAMUSCULAR | Status: AC
  Administered 2018-06-25: 5000 [IU] via SUBCUTANEOUS
  Filled 2018-06-25: qty 1

## 2018-06-25 MED ORDER — FENTANYL CITRATE (PF) 100 MCG/2ML IJ SOLN
INTRAMUSCULAR | Status: DC | PRN
  Administered 2018-06-25 (×4): 50 ug via INTRAVENOUS
  Administered 2018-06-25: 150 ug via INTRAVENOUS

## 2018-06-25 MED ORDER — PROMETHAZINE HCL 25 MG/ML IJ SOLN: 6.2500 mg | INTRAMUSCULAR | Status: DC | PRN

## 2018-06-25 MED ORDER — DEXAMETHASONE SODIUM PHOSPHATE 10 MG/ML IJ SOLN
INTRAMUSCULAR | Status: AC
  Filled 2018-06-25: qty 1

## 2018-06-25 MED ORDER — LACTATED RINGERS IV SOLN
INTRAVENOUS | Status: DC | PRN
  Administered 2018-06-25 (×2): via INTRAVENOUS

## 2018-06-25 MED ORDER — ROCURONIUM BROMIDE 50 MG/5ML IV SOSY
PREFILLED_SYRINGE | INTRAVENOUS | Status: AC
  Filled 2018-06-25: qty 10

## 2018-06-25 MED ORDER — DIPHENHYDRAMINE HCL 50 MG/ML IJ SOLN
INTRAMUSCULAR | Status: DC | PRN
  Administered 2018-06-25: 12.5 mg via INTRAVENOUS

## 2018-06-25 MED ORDER — METHOCARBAMOL 500 MG PO TABS
500.0000 mg | ORAL_TABLET | Freq: Four times a day (QID) | ORAL | Status: DC | PRN
  Administered 2018-06-25 – 2018-06-26 (×2): 500 mg via ORAL
  Filled 2018-06-25 (×2): qty 1

## 2018-06-25 MED ORDER — LIDOCAINE 2% (20 MG/ML) 5 ML SYRINGE
INTRAMUSCULAR | Status: AC
  Filled 2018-06-25: qty 5

## 2018-06-25 MED ORDER — ROCURONIUM BROMIDE 50 MG/5ML IV SOSY
PREFILLED_SYRINGE | INTRAVENOUS | Status: DC | PRN
  Administered 2018-06-25: 10 mg via INTRAVENOUS
  Administered 2018-06-25: 30 mg via INTRAVENOUS
  Administered 2018-06-25: 50 mg via INTRAVENOUS
  Administered 2018-06-25: 10 mg via INTRAVENOUS

## 2018-06-25 MED ORDER — SODIUM CHLORIDE 0.9 % IV SOLN
INTRAVENOUS | Status: DC | PRN
  Administered 2018-06-25: 500 mL
  Administered 2018-06-25: 1000 mL

## 2018-06-25 MED ORDER — FAMOTIDINE 20 MG PO TABS
20.0000 mg | ORAL_TABLET | Freq: Once | ORAL | Status: AC
  Administered 2018-06-25: 20 mg via ORAL
  Filled 2018-06-25: qty 1

## 2018-06-25 MED ORDER — PHENYLEPHRINE 40 MCG/ML (10ML) SYRINGE FOR IV PUSH (FOR BLOOD PRESSURE SUPPORT)
PREFILLED_SYRINGE | INTRAVENOUS | Status: AC
  Filled 2018-06-25: qty 10

## 2018-06-25 MED ORDER — CIPROFLOXACIN IN D5W 400 MG/200ML IV SOLN
400.0000 mg | Freq: Two times a day (BID) | INTRAVENOUS | Status: DC
  Administered 2018-06-25 – 2018-06-26 (×2): 400 mg via INTRAVENOUS
  Filled 2018-06-25 (×3): qty 200

## 2018-06-25 MED ORDER — ONDANSETRON 4 MG PO TBDP: 4.0000 mg | ORAL_TABLET | Freq: Four times a day (QID) | ORAL | Status: DC | PRN

## 2018-06-25 MED ORDER — HYDROCODONE-ACETAMINOPHEN 5-325 MG PO TABS: 1.0000 | ORAL_TABLET | ORAL | Status: DC | PRN

## 2018-06-25 MED ORDER — EPHEDRINE 5 MG/ML INJ
INTRAVENOUS | Status: AC
  Filled 2018-06-25: qty 10

## 2018-06-25 MED ORDER — FENTANYL CITRATE (PF) 250 MCG/5ML IJ SOLN
INTRAMUSCULAR | Status: AC
  Filled 2018-06-25: qty 5

## 2018-06-25 MED ORDER — CIPROFLOXACIN IN D5W 400 MG/200ML IV SOLN
400.0000 mg | INTRAVENOUS | Status: AC
  Administered 2018-06-25: 400 mg via INTRAVENOUS
  Filled 2018-06-25: qty 200

## 2018-06-25 MED ORDER — EPHEDRINE SULFATE 50 MG/ML IJ SOLN
INTRAMUSCULAR | Status: DC | PRN
  Administered 2018-06-25: 5 mg via INTRAVENOUS

## 2018-06-25 MED ORDER — SCOPOLAMINE 1 MG/3DAYS TD PT72
1.0000 | MEDICATED_PATCH | TRANSDERMAL | Status: DC
  Administered 2018-06-25: 1.5 mg via TRANSDERMAL
  Filled 2018-06-25: qty 1

## 2018-06-25 MED ORDER — SODIUM CHLORIDE 0.9 % IV SOLN
INTRAVENOUS | Status: DC | PRN
  Administered 2018-06-25: 20 ug/min via INTRAVENOUS

## 2018-06-25 MED ORDER — OXYCODONE HCL 5 MG PO TABS: 5.0000 mg | ORAL_TABLET | Freq: Once | ORAL | Status: DC | PRN

## 2018-06-25 MED ORDER — SODIUM CHLORIDE 0.9 % IV SOLN
INTRAVENOUS | Status: DC | PRN
  Administered 2018-06-25 (×3): 10 mL

## 2018-06-25 MED ORDER — ALBUMIN HUMAN 5 % IV SOLN
INTRAVENOUS | Status: DC | PRN
  Administered 2018-06-25: 10:00:00 via INTRAVENOUS

## 2018-06-25 MED ORDER — 0.9 % SODIUM CHLORIDE (POUR BTL) OPTIME
TOPICAL | Status: DC | PRN
  Administered 2018-06-25: 2000 mL

## 2018-06-25 MED ORDER — ONDANSETRON HCL 4 MG/2ML IJ SOLN
INTRAMUSCULAR | Status: AC
  Filled 2018-06-25: qty 2

## 2018-06-25 MED ORDER — HYDROMORPHONE HCL 1 MG/ML IJ SOLN
INTRAMUSCULAR | Status: AC
  Filled 2018-06-25: qty 1

## 2018-06-25 MED ORDER — PHENYLEPHRINE HCL 10 MG/ML IJ SOLN
INTRAMUSCULAR | Status: DC | PRN
  Administered 2018-06-25 (×2): 120 ug via INTRAVENOUS
  Administered 2018-06-25 (×3): 80 ug via INTRAVENOUS

## 2018-06-25 MED ORDER — SULFAMETHOXAZOLE-TRIMETHOPRIM 800-160 MG PO TABS: 1.0000 | ORAL_TABLET | Freq: Two times a day (BID) | ORAL | 0 refills | Status: DC

## 2018-06-25 MED ORDER — ONDANSETRON HCL 4 MG/2ML IJ SOLN: 4.0000 mg | Freq: Four times a day (QID) | INTRAMUSCULAR | Status: DC | PRN

## 2018-06-25 MED ORDER — PROPOFOL 10 MG/ML IV BOLUS
INTRAVENOUS | Status: DC | PRN
  Administered 2018-06-25: 200 mg via INTRAVENOUS

## 2018-06-25 MED ORDER — GABAPENTIN 300 MG PO CAPS
300.0000 mg | ORAL_CAPSULE | Freq: Two times a day (BID) | ORAL | Status: DC
  Administered 2018-06-25 – 2018-06-26 (×3): 300 mg via ORAL
  Filled 2018-06-25 (×3): qty 1

## 2018-06-25 MED ORDER — METHOCARBAMOL 500 MG PO TABS: 500.0000 mg | ORAL_TABLET | Freq: Three times a day (TID) | ORAL | 0 refills | Status: DC

## 2018-06-25 MED ORDER — BUPIVACAINE LIPOSOME 1.3 % IJ SUSP
20.0000 mL | INTRAMUSCULAR | Status: DC
  Filled 2018-06-25: qty 20

## 2018-06-25 MED ORDER — PROPOFOL 10 MG/ML IV BOLUS
INTRAVENOUS | Status: AC
  Filled 2018-06-25: qty 20

## 2018-06-25 MED ORDER — KETOROLAC TROMETHAMINE 30 MG/ML IJ SOLN
30.0000 mg | Freq: Three times a day (TID) | INTRAMUSCULAR | Status: AC
  Administered 2018-06-25 – 2018-06-26 (×3): 30 mg via INTRAVENOUS
  Filled 2018-06-25 (×3): qty 1

## 2018-06-25 MED ORDER — ONDANSETRON HCL 4 MG/2ML IJ SOLN
INTRAMUSCULAR | Status: DC | PRN
  Administered 2018-06-25 (×2): 4 mg via INTRAVENOUS

## 2018-06-25 MED ORDER — MIDAZOLAM HCL 5 MG/5ML IJ SOLN
INTRAMUSCULAR | Status: DC | PRN
  Administered 2018-06-25: 2 mg via INTRAVENOUS

## 2018-06-25 MED ORDER — OXYCODONE HCL 5 MG/5ML PO SOLN: 5.0000 mg | Freq: Once | ORAL | Status: DC | PRN

## 2018-06-25 MED ORDER — HYDROMORPHONE HCL 1 MG/ML IJ SOLN
0.2500 mg | INTRAMUSCULAR | Status: DC | PRN
  Administered 2018-06-25 (×2): 0.5 mg via INTRAVENOUS

## 2018-06-25 SURGICAL SUPPLY — 91 items
ADH SKN CLS APL DERMABOND .7 (GAUZE/BANDAGES/DRESSINGS) ×12
ALLODERM 8X16 MED THICK (Tissue) ×5 IMPLANT
APPLIER CLIP 9.375 MED OPEN (MISCELLANEOUS) ×5
APR CLP MED 9.3 20 MLT OPN (MISCELLANEOUS) ×3
BAG DECANTER FOR FLEXI CONT (MISCELLANEOUS) ×4 IMPLANT
BINDER BREAST XLRG (GAUZE/BANDAGES/DRESSINGS) ×2 IMPLANT
BLADE SURG 10 STRL SS (BLADE) ×7 IMPLANT
BLADE SURG 15 STRL LF DISP TIS (BLADE) ×3 IMPLANT
BLADE SURG 15 STRL SS (BLADE) ×5
BNDG COHESIVE 4X5 TAN STRL (GAUZE/BANDAGES/DRESSINGS) ×2 IMPLANT
CANISTER SUCT 3000ML PPV (MISCELLANEOUS) ×7 IMPLANT
CHLORAPREP W/TINT 26ML (MISCELLANEOUS) ×3 IMPLANT
CLIP APPLIE 9.375 MED OPEN (MISCELLANEOUS) ×3 IMPLANT
CLOSURE WOUND 1/2 X4 (GAUZE/BANDAGES/DRESSINGS)
CONT SPEC 4OZ CLIKSEAL STRL BL (MISCELLANEOUS) ×2 IMPLANT
COVER MAYO STAND STRL (DRAPES) ×2 IMPLANT
COVER SURGICAL LIGHT HANDLE (MISCELLANEOUS) ×7 IMPLANT
COVER WAND RF STERILE (DRAPES) ×5 IMPLANT
DERMABOND ADVANCED (GAUZE/BANDAGES/DRESSINGS) ×8
DERMABOND ADVANCED .7 DNX12 (GAUZE/BANDAGES/DRESSINGS) ×6 IMPLANT
DRAIN CHANNEL 15F RND FF W/TCR (WOUND CARE) ×4 IMPLANT
DRAIN CHANNEL 19F RND (DRAIN) ×2 IMPLANT
DRAPE HALF SHEET 40X57 (DRAPES) ×12 IMPLANT
DRAPE INCISE 23X17 IOBAN STRL (DRAPES) ×2
DRAPE INCISE 23X17 STRL (DRAPES) IMPLANT
DRAPE INCISE IOBAN 23X17 STRL (DRAPES) ×3 IMPLANT
DRAPE INCISE IOBAN 85X60 (DRAPES) ×5 IMPLANT
DRAPE ORTHO SPLIT 77X108 STRL (DRAPES) ×20
DRAPE SURG ORHT 6 SPLT 77X108 (DRAPES) ×12 IMPLANT
DRAPE WARM FLUID 44X44 (DRAPE) ×5 IMPLANT
DRSG MEPILEX BORDER 4X8 (GAUZE/BANDAGES/DRESSINGS) ×3 IMPLANT
DRSG PAD ABDOMINAL 8X10 ST (GAUZE/BANDAGES/DRESSINGS) ×12 IMPLANT
DURAPREP 26ML APPLICATOR (WOUND CARE) ×8 IMPLANT
ELECT BLADE 4.0 EZ CLEAN MEGAD (MISCELLANEOUS) ×5
ELECT BLADE 6.5 EXT (BLADE) ×7 IMPLANT
ELECT COATED BLADE 2.86 ST (ELECTRODE) ×15 IMPLANT
ELECT REM PT RETURN 9FT ADLT (ELECTROSURGICAL) ×5
ELECTRODE BLDE 4.0 EZ CLN MEGD (MISCELLANEOUS) ×3 IMPLANT
ELECTRODE REM PT RTRN 9FT ADLT (ELECTROSURGICAL) ×3 IMPLANT
EVACUATOR SILICONE 100CC (DRAIN) ×12 IMPLANT
GAUZE SPONGE 4X4 12PLY STRL LF (GAUZE/BANDAGES/DRESSINGS) ×6 IMPLANT
GAUZE XEROFORM 5X9 LF (GAUZE/BANDAGES/DRESSINGS) ×3 IMPLANT
GLOVE BIO SURGEON STRL SZ 6 (GLOVE) ×17 IMPLANT
GOWN STRL REUS W/ TWL LRG LVL3 (GOWN DISPOSABLE) ×6 IMPLANT
GOWN STRL REUS W/TWL LRG LVL3 (GOWN DISPOSABLE) ×30
IMPL BREAST INSPI SRX 545CC (Breast) IMPLANT
IMPL BREAST SILICONE 525CC (Breast) IMPLANT
IMPLANT BREAST INSPI SRX 545CC (Breast) ×5 IMPLANT
IMPLANT BREAST SILICONE 525CC (Breast) ×5 IMPLANT
KIT BASIN OR (CUSTOM PROCEDURE TRAY) ×5 IMPLANT
NEEDLE 22X1 1/2 (OR ONLY) (NEEDLE) ×5 IMPLANT
NS IRRIG 1000ML POUR BTL (IV SOLUTION) ×14 IMPLANT
PACK GENERAL/GYN (CUSTOM PROCEDURE TRAY) ×7 IMPLANT
PAD ARMBOARD 7.5X6 YLW CONV (MISCELLANEOUS) ×17 IMPLANT
PEN SKIN MARKING BROAD (MISCELLANEOUS) ×11 IMPLANT
PENCIL BUTTON HOLSTER BLD 10FT (ELECTRODE) ×2 IMPLANT
PIN SAFETY STERILE (MISCELLANEOUS) ×2 IMPLANT
PUNCH BIOPSY 4MM (MISCELLANEOUS) ×5
PUNCH BIOPSY DISP 4 (MISCELLANEOUS) IMPLANT
SET COLLECT BLD 21X3/4 12 PB (MISCELLANEOUS) IMPLANT
SIZER BREAST REUSE 495CC (SIZER) ×5
SIZER BREAST REUSE GEL 525CC (SIZER) ×5
SIZER BREAST REUSE GEL 545CC (SIZER) ×5
SIZER BRST REUSE 495CC (SIZER) IMPLANT
SIZER BRST REUSE GEL 525CC (SIZER) IMPLANT
SIZER BRST REUSE GEL 545CC (SIZER) IMPLANT
SOL PREP POV-IOD 4OZ 10% (MISCELLANEOUS) ×2 IMPLANT
SPONGE LAP 18X18 X RAY DECT (DISPOSABLE) ×4 IMPLANT
STAPLER VISISTAT 35W (STAPLE) ×5 IMPLANT
STOCKINETTE IMPERVIOUS 9X36 MD (GAUZE/BANDAGES/DRESSINGS) ×2 IMPLANT
STRIP CLOSURE SKIN 1/2X4 (GAUZE/BANDAGES/DRESSINGS) ×6 IMPLANT
SUT ETHILON 2 0 FS 18 (SUTURE) ×10 IMPLANT
SUT MNCRL AB 4-0 PS2 18 (SUTURE) ×10 IMPLANT
SUT PDS AB 2-0 CT1 27 (SUTURE) ×6 IMPLANT
SUT PDS AB 2-0 CT2 27 (SUTURE) ×14 IMPLANT
SUT VIC AB 3-0 PS2 18 (SUTURE) ×5
SUT VIC AB 3-0 PS2 18XBRD (SUTURE) ×12 IMPLANT
SUT VIC AB 3-0 SH 27 (SUTURE) ×10
SUT VIC AB 3-0 SH 27X BRD (SUTURE) IMPLANT
SUT VIC AB 3-0 SH 8-18 (SUTURE) ×3 IMPLANT
SUT VICRYL 4-0 PS2 18IN ABS (SUTURE) ×6 IMPLANT
SUT VLOC 180 0 24IN GS25 (SUTURE) ×4 IMPLANT
SYR 50ML SLIP (SYRINGE) IMPLANT
SYR BULB IRRIGATION 50ML (SYRINGE) ×7 IMPLANT
SYR CONTROL 10ML LL (SYRINGE) ×5 IMPLANT
TISSUE ALLDRM 8X16 MED THICK (Tissue) IMPLANT
TOWEL OR 17X24 6PK STRL BLUE (TOWEL DISPOSABLE) ×5 IMPLANT
TOWEL OR 17X26 10 PK STRL BLUE (TOWEL DISPOSABLE) ×5 IMPLANT
TRAY FOLEY MTR SLVR 14FR STAT (SET/KITS/TRAYS/PACK) ×5 IMPLANT
TUBE CONNECTING 12'X1/4 (SUCTIONS) ×3
TUBE CONNECTING 12X1/4 (SUCTIONS) ×6 IMPLANT

## 2018-06-25 NOTE — Transfer of Care (Signed)
Immediate Anesthesia Transfer of Care Note  Patient: Kathryn Stanley  Procedure(s) Performed: REMOVAL OF BILATERAL TISSUE EXPANDERS WITH PLACEMENT OF BILATERAL SILICONE BREAST IMPLANTS (Bilateral Breast) LEFT LATISSIMUS FLAP TO LEFT CHEST (Left Chest) ACELLULAR DERMIS TO RIGHT CHEST (Right Chest)  Patient Location: PACU  Anesthesia Type:General  Level of Consciousness: awake, alert  and oriented  Airway & Oxygen Therapy: Patient Spontanous Breathing and Patient connected to nasal cannula oxygen  Post-op Assessment: Report given to RN, Post -op Vital signs reviewed and stable and Patient moving all extremities X 4  Post vital signs: Reviewed and stable  Last Vitals:  Vitals Value Taken Time  BP 148/61 06/25/2018 12:41 PM  Temp    Pulse 96 06/25/2018 12:43 PM  Resp 13 06/25/2018 12:43 PM  SpO2 100 % 06/25/2018 12:43 PM  Vitals shown include unvalidated device data.  Last Pain:  Vitals:   06/25/18 0600  TempSrc:   PainSc: 0-No pain         Complications: No apparent anesthesia complications

## 2018-06-25 NOTE — Anesthesia Procedure Notes (Signed)
Procedure Name: Intubation Date/Time: 06/25/2018 7:36 AM Performed by: Inda Coke, CRNA Pre-anesthesia Checklist: Patient identified, Emergency Drugs available, Suction available and Patient being monitored Patient Re-evaluated:Patient Re-evaluated prior to induction Oxygen Delivery Method: Circle System Utilized Preoxygenation: Pre-oxygenation with 100% oxygen Induction Type: IV induction Ventilation: Mask ventilation without difficulty Laryngoscope Size: Mac and 3 Grade View: Grade I Tube type: Oral Tube size: 7.0 mm Number of attempts: 1 Airway Equipment and Method: Stylet and Oral airway Placement Confirmation: ETT inserted through vocal cords under direct vision,  positive ETCO2 and breath sounds checked- equal and bilateral Secured at: 21 cm Tube secured with: Tape Dental Injury: Teeth and Oropharynx as per pre-operative assessment

## 2018-06-25 NOTE — Anesthesia Postprocedure Evaluation (Signed)
Anesthesia Post Note  Patient: Kambryn Dapolito Mclennan  Procedure(s) Performed: REMOVAL OF BILATERAL TISSUE EXPANDERS WITH PLACEMENT OF BILATERAL SILICONE BREAST IMPLANTS (Bilateral Breast) LEFT LATISSIMUS FLAP TO LEFT CHEST (Left Chest) ACELLULAR DERMIS TO RIGHT CHEST (Right Chest)     Patient location during evaluation: PACU Anesthesia Type: General Level of consciousness: awake and alert Pain management: pain level controlled Vital Signs Assessment: post-procedure vital signs reviewed and stable Respiratory status: spontaneous breathing, nonlabored ventilation, respiratory function stable and patient connected to nasal cannula oxygen Cardiovascular status: blood pressure returned to baseline and stable Postop Assessment: no apparent nausea or vomiting Anesthetic complications: no    Last Vitals:  Vitals:   06/25/18 1430 06/25/18 1448  BP:  124/63  Pulse: 79 68  Resp: 19 17  Temp:  36.7 C  SpO2: 96% 100%    Last Pain:  Vitals:   06/25/18 1500  TempSrc:   PainSc: 0-No pain                 Ryan P Ellender

## 2018-06-25 NOTE — Op Note (Signed)
Operative Note   DATE OF OPERATION: 1.17.20  LOCATION: [facility] OR-observation  SURGICAL DIVISION: Plastic Surgery  PREOPERATIVE DIAGNOSES:  1. History breast cancer 2. Acquired absence bilateral breasts 3. History therapeutic radiation 4. BRCA1  POSTOPERATIVE DIAGNOSES:  same  PROCEDURE:  1. Removal bilateral tissue expanders and placement silicone implants 2. Left latissimus dorsi flap for breast reconstruction 3. Acellular dermis (Alloderm) for right breast reconstruction 120 cm2 4. Excision right chest benign nevus 1 cm 5. Layered closure right chest 1.5 cm  SURGEON: Irene Limbo MD MBA  ASSISTANT: Paulette Blanch PA-C  ANESTHESIA:  General.   EBL: 35 ml  COMPLICATIONS: None immediate.   INDICATIONS FOR PROCEDURE:  The patient, Kathryn Stanley, is a 43 y.o. female born on Aug 24, 1975, is here for staged breast reconstruction. She underwent bilateral skin reduction pattern mastectomies with immediate prepectoral ADM reconstruction. She received adjuvant left chest radiation and plan left latissimus flap for coverage. Over right chest she demonstrates lateral displacement and plan acellular dermis for support.   FINDINGS: Samule Ohm Smooth Round Extra Projection implants placed bilateral. RIGHT 545 ml REF SRX-545 [id number] LEFT 525 ml REF SRX-525 [id number]. Over right chest, majority acellular dermis noted to be resorbed.   DESCRIPTION OF PROCEDURE:  The patient's operative site was marked with the patient in the preoperative area to include chest midline, anterior axillary lines. The patient was taken to the operating room. SCDs were placed and IV antibiotics and subcutaneous heparin were given. Foley catheter placed. Patient placed in right lateral position. The patient's operative site was prepped and draped in a sterile fashion. A time out was performed and all information was confirmed to be correct. Incision made through prior left chest scar, vertical portion.Incision  carried through superficial fascia and acellular dermis. Full incorporation ADM noted over left chest.The expander was removed. Circumferential capsulotomies performed and skin flap elevated off pectoalis surface to accommodate implant. Incision made surrounding skin paddle designed over leftback. Skin and superficial fascia elevated off surface of latissimus muscle and subcutaneous tunnel dissected to axilla joining anterior breast cavity.Anterior border of latissimus identified and elevated. Muscle divided inferiorly at superior iliac spine. Submuscular dissection completed toward midline back and toward origin. Thoracodorsal nerve wasidentified anddivided. Flap rotated into anterior chest cavity. Back irrigated and hemostasis ensured. Exparel infiltrated total 266 mg forbilateralchest and back. 15Fr drain placed and secured with 2-0 nylon. 2-0 PDS used to place quilting sutures from elevated skin flaps to chest wall. Incision closed with 0 V lock suture in superficial fascia and dermis. Skin closure completed with 4-0 monocryl subcuticular and tissue glue applied. The latissimus flap was placed in chest cavity, mastectomy flaps stapled over flap. The patient was placed in supine position and re prepped and draped.   The flap muscle was redraped withinleftchest.The entire skin paddle brought with latissimus was removed.The muscle was secured to pectoralis and serratus muscle and inferiorly to abdominal wall fascia with interrupted 2-0 PDS. 35 Fr JP drain placed in breast cavity and secured to chest with 2-0 nylon. A sizer was placed beneath latissimus muscle.  Over right chest, elliptical excision of chest nevus completed, diameter 1 cm. Layered closure completed with 4-0 monocryl in dermis and 4-0 monocryl subcuticular skin closure, length 1.5 cm.   Over right chest, incision made in inframammary fold scar. Incision carried through superficial fascia and ADM to implant  capsule.Expanderremoved.Acellular dermis noted to be largely resorbed in right cavity. Capsulotomies performed medially. Elliptical skin resection marked along inframammary fold and this  area was deepithelialized. Sizer placed. Breaststailor tacked closed, and patient brought to upright sitting position and assessed for symmetry.A NatrelleInspira Smooth Round Extra Projection545 ml implantselectedfor right chest and 525 ml implant for left chest.  Patent returned to supine position. Bilateral cavities irrigated with solution containing polymyxinand bacitracin,hemostasis obtained.Cavities then irrigated with Betadine.  Left implant placed below latissimus muscle.Remainder oflatissimus muscle sutured to chest wall withPDS sutures.Closure incision completed with 3-0 vicryl in superficial fascia, 4-0 vicryl in dermis, 4-0 monocryl for skin closure.Incidental skin flap perforation incurred during dissection and this was repaired primarily with 4-0 vicryl in dermis and 4-0 monocryl subcuticular skin closure.  Acellular dermiswasperforated and inset to right chest wall with 2-0 PDS and 0 V lock medial to desired anterior axillary line. The ADM was then redraped over sizer and sutured to anterior capsule with 2-0 PDS. Additional plication suture placed with interrupted 2-0 PDS to chest wall and anterior mastectomy flap capsule. 15 Fr JP placed and secured with 2-0 nylon. Implant placed in right breast cavity. Care taken to ensure proper orientation. Remainder ADM sling sutured to anterior capsule with 2-0 PDS suture. Superficial fascia and capsule closed with running 3-0 vicryl followed by 4-0 vicryl in dermis, 4-0 monocryl subcuticular for skin closure.  Tissue adhesive was applied to breast and back incisions. Dry dressing and breast binder, applied  The patient was allowed to wake from anesthesia, extubated and taken to the recovery room in satisfactory condition.   SPECIMENS: right  chest nevus  DRAINS: 15 Fr JP left back, 19 Fr JP left subcutaneous chest, 19 Fr JP right breast reconstruction  Irene Limbo, MD [facility] Plastic & Reconstructive Surgery [phone], pin [phone]

## 2018-06-25 NOTE — Op Note (Signed)
First Assist Op Note: [facility] OR I assisted the Surgeon(s) __Dr. Arnoldo Hooker Thimmappa__ on the procedure(s): __Removal of bilateral tissue expanders with placement of bilateral silicone breast implants, left latissimus flap to left chest, acellular dermis to right chest__on Date _1/17/2020.   I provided my assistance on this case as follows:   I was present and acted as first Environmental consultant during this operation. I was present during the patient transport into the operative suite and assisted the OR staff with transferring and positioning of the patient. All extremities were checked and properly cushioned and safety straps in place. I was involved in the prepping and placement of sterile drapes. A time out was performed and all information confirmed to be correct.  I first assisted during the case including retraction for exposure, assisting with closure of surgical wounds and application of sterile dressings. I provided assistance with application of post operative garments/splinting and assisted with patient transfer back to the stretcher as needed.  Caren Macadam PA-C Plastic Surgery [phone]

## 2018-06-25 NOTE — H&P (Signed)
Subjective:   Patient ID: Kathryn Stanley is a 43 y.o. female.  HPI  11 months post op bilateral mastectomies for left breast cancer and BRCA1 diagnosis. Plan removal bilateral tissue expanders, silicone implant placement, left latissimus flap and acellular dermis to right chest.   Presented with palpable mass. MMG with mass 2.3 x 1.6 x 3.0 cm in the 130 o'clock e left breast 8cmfn with enlarged axillary LN . Indeterminate group of calcifications in the upper central portion of the left breast. Biopsy of 130 mass IDC, ER+ weak, PR-, Her2 -. LN positive for metastatic disease. MRI demonstrated 2.5 x 1.3 x 1.8 cm mass UOQ. Enlarged level I and level II left axillary adenopathy noted, including level II LN behind the pectoralis muscle.  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  Staging studies negative for distant disease.  Completed neoadjuvant chemotherapy. Completed adjuvant radiation 5.10.19. On Xeloda.   Final MRI with decrease size mass to 1.2 x 1.0 x 1.6 cm, previously 2.5 x 1.3 x 1.8 cm. The previously noted enlarged LN decreased in size.   Final pathology right benign, left with 1.3 cm IDC, margins clear, 0/2 SLN.  Prior 42C, happy wit this. Right mastectomy 910 g Left 743 g  Works at [employer].  Quit smoking 03/2017, off nicotine patch   Objective:  Physical Exam  Cardiovascular: Normal rate, regular rhythm and normal heart sounds.  Pulmonary/Chest: Effort normal and breath sounds normal.  Lymphadenopathy:  She has no axillary adenopathy.   Chest: Soft bilateral, left chest hyperpigmentation, able to pinch mastectomy flap soft tissue over expander, no lateral displacement Right chest with lateral displacement expander Right chest pigmented papular almost pedunculated nevus  Assessment:   Left breast ca UOQ ER+ BRCA1 Neoadjuvant chemotherapy S/p bilateral SRM, TE/ADM (Alloderm) prepectoral reconstruction   Adjuvant radiation  Plan:   Plan removal bilateral TE, left latissimus flap and right ADM to correct lateral displacement, and excision right chest nevus  Reviewed increased risks reconstruction following radiation including wound healing problems, capsular contracture. Patient has elected for latissimus flap for left side for coverage in order to decrease these risks. Reviewed incisions, drains recovery related with LD flap, risk failure flap, seroma. Discussed function of LD muscle and overall well tolerated but possible to experience changes in strength and rotation UE.  For right chest, has lateral displacement expander. Will make final decision after examining pocket but plan for additional acellular to correct malposition. Reviewed use of acellular dermis in reconstruction, cadaveric source, incorporation over several weeks, risk that if has seroma or infection can act as additional nidus for infection if not incorporated.  Reviewed saline vs silicone, shaped vs round. HCG or capacity filled silicone implants may offer reduced risk visible rippling. Reviewed MRI or USsurveillance for rupture with silicone implants. Reviewed examples for 4th generation, capacity filled 4th generation, and HCG implants vs saline implants. Reviewed risks AP flipping that may be more noticeable with 5th generation implants, may require surgery to correct. Plan at this time smooth round silicone implants, plan capacity filled.  Reviewed reconstruction will be asensate and not stimulate. Reviewed additional risks including but not limited to risks mastectomy flap necrosis requiring additional surgery, seroma, hematoma, asymmetry, need to additional procedures, fat necrosis, DVT/PE, damage to adjacent structures, cardiopulmonary complications.   Natrelle 133FV-12-T 400 ml tissue expanders placed bilateral,  RIGHT fill volume 400 ml saline. LEFT fill volume 400 ml saline  Irene Limbo, MD [facility] Plastic &  Reconstructive Surgery [phone], pin [phone]

## 2018-06-26 DIAGNOSIS — Z421 Encounter for breast reconstruction following mastectomy: Secondary | ICD-10-CM | POA: Diagnosis not present

## 2018-06-26 MED ORDER — SULFAMETHOXAZOLE-TRIMETHOPRIM 800-160 MG PO TABS: 1.0000 | ORAL_TABLET | Freq: Two times a day (BID) | ORAL | 0 refills | Status: AC

## 2018-06-26 MED ORDER — METHOCARBAMOL 500 MG PO TABS: 500.0000 mg | ORAL_TABLET | Freq: Three times a day (TID) | ORAL | 0 refills | Status: DC | PRN

## 2018-06-26 MED ORDER — HYDROCODONE-ACETAMINOPHEN 5-325 MG PO TABS: 1.0000 | ORAL_TABLET | ORAL | 0 refills | Status: DC | PRN

## 2018-06-26 NOTE — Discharge Summary (Signed)
Physician Discharge Summary  Patient ID: TAMILA GAULIN MRN: [id number] DOB/AGE: [birth year] 43 y.o.  Admit date: 06/25/2018 Discharge date: 06/26/2018  Admission Diagnoses: History breast cancer, acquired absence breasts, BRCA, history therapeutic radiation  Discharge Diagnoses:  same  Discharged Condition: stable  Hospital Course: Patient did well postoperatively with pain controlled with oral medication, tolerating diet and ambulatory with minimal assist. She and family instructed on drain care.  Treatments: surgery: removal bilateral tissue expanders placement silicone implants, left latissimus dorsi flap, right chest acellular dermis 1.17.20  Discharge Exam: Blood pressure (!) 106/57, pulse 69, temperature 98.2 F (36.8 C), temperature source Oral, resp. rate 16, last menstrual period 01/07/2017, SpO2 98 %. Incision/Wound: back flat chest soft drains serosanguinous incisions dry intact  Disposition:   Discharge Instructions    Call MD for:  redness, tenderness, or signs of infection (pain, swelling, bleeding, redness, odor or green/yellow discharge around incision site)   Complete by:  As directed    Call MD for:  temperature >100.5   Complete by:  As directed    Discharge instructions   Complete by:  As directed    Ok to remove dressings and shower am 1.19.20. Soap and water ok, pat dry. No creams or ointments over incisions. Do not let drains dangle in shower, attach to lanyard or similar.Strip and record drains twice daily and bring log to clinic visit.  Breast binder or soft compression bra all other times.  Ok to raise arms above shoulders for bathing and dressing.  No house yard work or exercise until cleared by MD.   Recommend Miralax or Dulcolax as needed for constipation. Recommend ibuprofen as directed with meals to aid with pain control.    Driving Restrictions   Complete by:  As directed    No driving for 2 weeks then no driving if taking narcotics.    Lifting restrictions   Complete by:  As directed    No lifting > 5 lbs until cleared by MD   Resume previous diet   Complete by:  As directed      Allergies as of 06/26/2018      Reactions   Amoxicillin Anaphylaxis, Rash, Other (See Comments)   PATIENT HAS HAD A PCN REACTION WITH IMMEDIATE RASH, FACIAL/TONGUE/THROAT SWELLING, SOB, OR LIGHTHEADEDNESS WITH HYPOTENSION:  Yes Has patient had a PCN reaction causing severe rash involving mucus membranes or skin necrosis: No Has patient had a PCN reaction that required hospitalization: No Has patient had a PCN reaction occurring within the last 10 years: No If all of the above answers are "NO", then may proceed with Cephalosporin use.   Other Nausea And Vomiting, Other (See Comments)   Anesthesia-vomiting repeatedly with nausea.   Chlorhexidine Gluconate Rash   Nicotine Other (See Comments)   Patch caused soreness at application site      Medication List    TAKE these medications   HYDROcodone-acetaminophen 5-325 MG tablet Commonly known as:  NORCO Take 1-2 tablets by mouth every 4 (four) hours as needed for moderate pain.   hydrocortisone cream 1 % Apply 1 application topically daily as needed (for redness on face).   ibuprofen 200 MG tablet Commonly known as:  ADVIL,MOTRIN Take 200-400 mg by mouth every 6 (six) hours as needed for headache (pain).   methocarbamol 500 MG tablet Commonly known as:  ROBAXIN Take 1 tablet (500 mg total) by mouth every 8 (eight) hours as needed for up to 30 doses for muscle spasms.   PEPCID PO  Take 1 tablet by mouth daily as needed (acid reflux).   sulfamethoxazole-trimethoprim 800-160 MG tablet Commonly known as:  BACTRIM DS,SEPTRA DS Take 1 tablet by mouth 2 (two) times daily for 6 days.      Follow-up Information    Irene Limbo, MD In 1 week.   Specialty:  Plastic Surgery Why:  as scheduled Contact information: [address] [place] [address] [phone]            Signed: Irene Limbo 06/26/2018, 9:47 AM

## 2018-06-26 NOTE — Progress Notes (Signed)
Paged Dr Rubye Oaks re: BP 89/53. Explained R arm has been puffy since surgery per husband.  Fluids were cut down overnight as patient was drinking and eating well, arm elevated.  No complaints from patient.  Provider aware, no new orders given

## 2018-06-28 ENCOUNTER — Encounter (HOSPITAL_COMMUNITY): Payer: Self-pay | Admitting: Plastic Surgery

## 2018-07-16 ENCOUNTER — Encounter: Payer: Medicaid Other | Admitting: Adult Health

## 2018-08-26 ENCOUNTER — Ambulatory Visit: Payer: Medicaid Other

## 2018-08-31 ENCOUNTER — Telehealth: Payer: Medicaid Other | Admitting: Internal Medicine

## 2018-08-31 ENCOUNTER — Telehealth: Payer: Self-pay | Admitting: Internal Medicine

## 2018-08-31 ENCOUNTER — Other Ambulatory Visit: Payer: Self-pay

## 2018-08-31 NOTE — Telephone Encounter (Signed)
Called patient's mobile number to set up telephone encounter. Pt did not pick up. Left number for callback.

## 2018-10-01 ENCOUNTER — Telehealth: Payer: Self-pay | Admitting: Hematology

## 2018-10-01 NOTE — Telephone Encounter (Signed)
Patient on reschedule list for SCP visit with Lacie, however is currently scheduled to see YF 5/5 via webex. Message to [place] re letting me know after 5/5 visit when patient should have SCP visit.

## 2018-10-08 ENCOUNTER — Telehealth: Payer: Self-pay | Admitting: Hematology

## 2018-10-08 NOTE — Telephone Encounter (Signed)
Called regarding upcoming Webex appointment, left patient a voicemail and e-mail is sent.

## 2018-10-11 NOTE — Progress Notes (Signed)
Kathryn Stanley   [phone] [phone]   Clinic Follow up Note   Patient Care Team: Mosetta Anis, MD as PCP - Joylene Draft, MD as Consulting Physician (General Surgery) [name], MD as Consulting Physician (Hematology) [name], MD as Consulting Physician (Radiation Oncology)   I connected with Kathryn Stanley on 10/12/2018 at  9:45 AM EDT by video enabled telemedicine visit and verified that I am speaking with the correct person using two identifiers.  I discussed the limitations, risks, security and privacy concerns of performing an evaluation and management service by telephone and the availability of in person appointments. I also discussed with the patient that there may be a patient responsible charge related to this service. The patient expressed understanding and agreed to proceed.   Patient's location:  At work Provider's location:  My Office  CHIEF COMPLAINT: Follow-up left breast cancer   SUMMARY OF ONCOLOGIC HISTORY: Oncology History   Cancer Staging Malignant neoplasm of upper-outer quadrant of left breast in female, estrogen receptor positive ([place]) Staging form: Breast, AJCC 8th Edition - Clinical stage from 12/29/2016: Stage IIIA (cT2, cN1, cM0, G3, ER: Positive, PR: Negative, HER2: Negative) - Signed by [name], MD on 01/07/2017 - Pathologic: No Stage Recommended (ypT1c, pN0, cM0, G2, ER-, PR-, HER2-) - Unsigned Cancer Staging Malignant neoplasm of upper-outer quadrant of left breast in female, estrogen receptor positive ([place]) Staging form: Breast, AJCC 8th Edition - Clinical stage from 12/29/2016: Stage IIIA (cT2, cN1, cM0, G3, ER: Positive, PR: Negative, HER2: Negative) - Signed by [name], MD on 01/07/2017 - Pathologic: No Stage Recommended (ypT1c, pN0, cM0, G2, ER-, PR-, HER2-) - Unsigned       Malignant neoplasm of upper-outer quadrant of left breast in female, estrogen receptor positive ([place])   12/25/2016  Mammogram    [place] and [facility] Diagnostic Breast Tomo Bilateral 12/25/16 IMPRESSION: 1. Suspicious mass 2.3 x 1.6 x 3.0 cm  in the 130 o'clock location of the left breast 8cm from the nipple, biopsy recommended. Adjacent simple cyst is 1.9 cm. 2. Suspicious left axillary lymph node 2.2cm for which biopsy is indicated. 3. Indeterminate group of calcifications in the upper central portion of the left breast for which biopsy is recommended.     12/29/2016 Initial Biopsy    Diagnosis 12/29/16 1. Breast, left, needle core biopsy, stereotactic, upper inner quadrant - FIBROCYSTIC CHANGES INCLUDING APOCRINE METAPLASIA WITH CALCIFICATIONS - NO MALIGNANCY IDENTIFIED 2. Breast, left, needle core biopsy, ultrasound, 1:30 o'clock - INVASIVE MAMMARY CARCINOMA, G3 - SEE COMMENT 3. Lymph node, needle/core biopsy, ultrasound, left axillary - METASTIC CARCINOMA INVOLVING ONE LYMPH NODE (1/1) E-cadherin is positive supporting a ductal origin.     12/29/2016 Receptors her2    ER 15%+, weak staining  PR - HER2- Ki67 10%     12/29/2016 Initial Diagnosis    Malignant neoplasm of upper-outer quadrant of left breast in female, estrogen receptor positive ([place])    01/13/2017 Echocardiogram    ECHO 01/13/17 Study Conclusions - Left ventricle: The cavity size was normal. Systolic function was   normal. The estimated ejection fraction was in the range of 60%   to 65%. Wall motion was normal; there were no regional wall   motion abnormalities. Left ventricular diastolic function   parameters were normal. - Mitral valve: There was trivial regurgitation. - Right atrium: The atrium was mildly dilated. - Tricuspid valve: There was trivial regurgitation.     01/14/2017 Surgery    Port placement  by Dr. Donne Hazel     01/15/2017 Imaging    Whole Body bone Scan 01/15/17 IMPRESSION: 1.  No significant abnormality identified.    01/15/2017 Imaging    CT CAP W Contrast 01/15/17 IMPRESSION: 1. Left lateral breast mass with  pathologic left axillary adenopathy including a 2.0 cm left axillary lymph node. 2. Several hypodense lesions in the liver are likely benign lesions such as cysts. However, these are technically too small to characterize by CT. Possibilities for further workup include surveillance or hepatic protocol MRI with and without contrast. 3. There is also a tiny hypodense lesion in the spleen which is technically nonspecific but statistically highly likely to be benign. 4.  Prominent stool throughout the colon favors constipation    01/16/2017 - 06/05/2017 Chemotherapy    Neoadjuvant chemo AC every 2 weeks for 4 cycles starting on 01/16/2017 and ended 02/27/17. Then proceed with weekly taxol for 12 treatments starting 03/13/17. Added Carboplatin AUC 2 on 04/03/17 to weekly taxol. Due to symptomatic thrombocytopenia we held Carbo since cycle 8. Cycle 9 was post-poned due to neutropenia and Granix was added starting 05/12/17. Added carbo back at reduced dose AUC 1.5 with cycle 10 on 05/22/17. 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   Analysis also detected a Variant of Uncertain Significance (VUS) in the NBN gene called c.628G>T (p.Val210Phe)  Update: The Variant of Uncertain Significance NBN c.628G>T (p.Val210Phe) has been reclassified to "Likely Benign." Report date is 04/09/2018.     06/12/2017 Imaging    MR BREAST BILATERAL WO CONTRAST IMPRESSION: 1. Significant positive response to neoadjuvant chemotherapy. The index carcinoma in the left breast has significantly decreased in size. Left axillary adenopathy has also significantly improved. 2. No other evidence of malignancy.    07/16/2017 Surgery    Pt underwent a bilateral mastectomy and left SLN biopsy with Dr. Donne Hazel    07/16/2017 Pathology Results    Diagnosis 07/16/17 1. Breast, simple mastectomy, Right  Prophylactic - FIBROCYSTIC CHANGES INCLUDING APOCRINE METAPLASIA - DUCT ECTASIA - CALCIFICATIONS - NO MALIGNANCY IDENTIFIED 2. Lymph node, sentinel, biopsy, Left Axillary - NO CARCINOMA IDENTIFIED IN ONE LYMPH NODE (0/1) - SEE COMMENT 3. Lymph node, sentinel, biopsy, Left - NO CARCINOMA IDENTIFIED IN ONE LYMPH NODE (0/1) - SEE COMMENT 4. Breast, simple mastectomy, Left - INVASIVE DUCTAL CARCINOMA, NOTTINGHAM GRADE 2 OF 3, 1.3 CM - MARGINS UNINVOLVED BY CARCINOMA (2 CM POSTERIOR MARGIN) - PREVIOUS BIOPSY SITE CHANGES - CALCIFICATIONS - SEE ONCOLOGY TABLE BELOW    07/16/2017 Receptors her2    Results: IMMUNOHISTOCHEMICAL AND MORPHOMETRIC ANALYSIS PERFORMED MANUALLY Estrogen Receptor: 0%, NEGATIVE Progesterone Receptor: 0%, NEGATIVE Results: HER2 - NEGATIVE RATIO OF HER2/CEP17 SIGNALS 1.73 AVERAGE HER2 COPY NUMBER PER CELL 2.67    09/02/2017 - 10/16/2017 Radiation Therapy    Adjuvant radiation per Dr. Lisbeth Dufault     11/03/2017 Surgery    XI ROBOTIC ASSISTED TOTAL HYSTERECTOMY WITH BILATERAL SALPINGO OOPHORECTOMY  by Dr. Denman George and Dr. Delsa Sale 11/03/17    11/23/2017 - 02/08/2018 Chemotherapy    Adjuvant Xeloda 2029m BID 2 weeks on and 1 week off    06/25/2018 Surgery    Bilateral breast reconstruction with Dr. TIran Planas 06/25/18       CURRENT THERAPY:  Surveillance  INTERVAL HISTORY:  Kathryn ABREUis here for a follow up of left breast cancer. She was last seen by  me 6 months ago. Since then she underwent b/l breast reconstruction on 06/25/18. She was able to identify herself by face to face video.   She notes she gets swelling on the top of her foot, ankles and hands. This is intermittent and does not only occur when she works. She notes she is on her feet working a lot of the times.  She also notes fewer urinary infections. She has been drinking more water. She denies stomach pain. She notes her hot flashes are intermittent now. She notes she stopped taking Effexor due to  its side effects.   She notes she does not get depressed, but does get emotional, anxious and sad about getting breast cancer recurrence.    REVIEW OF SYSTEMS:   Constitutional: Denies fevers, chills or abnormal weight loss (+) intermittent hot flashes  Eyes: Denies blurriness of vision Ears, nose, mouth, throat, and face: Denies mucositis or sore throat Respiratory: Denies cough, dyspnea or wheezes Cardiovascular: Denies palpitation, chest discomfort (+) Intermittent hand and lower extremity swelling Gastrointestinal:  Denies nausea, heartburn or change in bowel habits Skin: Denies abnormal skin rashes UA: (+) fewer urinary infections  Lymphatics: Denies new lymphadenopathy or easy bruising Neurological:Denies numbness, tingling or new weaknesses Behavioral/Psych: Mood is stable, no new changes (+) anxious and emotional  All other systems were reviewed with the patient and are negative.  MEDICAL HISTORY:  Past Medical History:  Diagnosis Date  . Breast cancer, left breast ([place]) dx'd 01/2017  . Complication of anesthesia   . Dermatologic problem    possible psoriasis. Pt has not had been diagnosed by dermatology.  . 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 GERD (gastroesophageal reflux disease)   . PONV (postoperative nausea and vomiting)   . Tobacco dependence    trying to quit with nicotene patches    SURGICAL HISTORY: Past Surgical History:  Procedure Laterality Date  . APPLICATION OF A-CELL OF CHEST/ABDOMEN Right 06/25/2018   Procedure: ACELLULAR DERMIS TO RIGHT CHEST;  Surgeon: Irene Limbo, MD;  Location: [place];  Service: Plastics;  Laterality: Right;  . BREAST BIOPSY Left 12/2016  . BREAST RECONSTRUCTION Bilateral 07/16/2017   BILATERAL BREAST RECONSTRUCTION WITH PLACEMENT OF TISSUE EXPANDER AND ALLODERM/notes 07/16/2017  . BREAST RECONSTRUCTION  06/25/2018  . CESAREAN SECTION  2002  .  LATISSIMUS FLAP TO BREAST Left 06/25/2018   Procedure: LEFT LATISSIMUS FLAP TO LEFT CHEST;  Surgeon: Irene Limbo, MD;  Location: [place];  Service: Plastics;  Laterality: Left;  Marland Kitchen MASTECTOMY Bilateral 07/16/2017   LEFT SKIN SPARING MASTECTOMY WITH LEFT RADIOACTIVE SEED TARGETED AXILLARY LYMPH NODE EXCISION AND AXILLARY SENTINEL LYMPH NODE BIOPSY; RIGHT PROPHYLACTIC SKIN SPARING MASTECTOMY [name] 07/16/2017  . MASTECTOMY WITH RADIOACTIVE SEED GUIDED EXCISION AND AXILLARY SENTINEL LYMPH NODE BIOPSY Left 07/16/2017   Procedure: LEFT SKIN SPARING MASTECTOMY WITH LEFT RADIOACTIVE SEED TARGETED AXILLARY LYMPH NODE EXCISION AND AXILLARY SENTINEL LYMPH NODE BIOPSY;  Surgeon: [name], MD;  Location: [facility];  Service: General;  Laterality: Left;  . NIPPLE SPARING MASTECTOMY Right 07/16/2017   Procedure: RIGHT PROPHYLACTIC SKIN SPARING MASTECTOMY;  Surgeon: [name], MD;  Location: [place];  Service: General;  Laterality: Right;  . PORT-A-CATH REMOVAL N/A 07/16/2017   Procedure: REMOVAL PORT-A-CATH;  Surgeon: [name], MD;  Location: [place];  Service: General;  Laterality: N/A;  . PORTA CATH REMOVAL  07/16/2017  . PORTACATH PLACEMENT N/A 01/14/2017   Procedure: INSERTION PORT-A-CATH WITH [place];  Surgeon: [name], MD;  Location: [place];  Service: General;  Laterality: N/A;  . REMOVAL OF BILATERAL TISSUE EXPANDERS WITH PLACEMENT OF BILATERAL BREAST IMPLANTS Bilateral 06/25/2018   Procedure: REMOVAL OF BILATERAL TISSUE EXPANDERS WITH PLACEMENT OF BILATERAL SILICONE BREAST IMPLANTS;  Surgeon: Irene Limbo, MD;  Location: [place];  Service: Plastics;  Laterality: Bilateral;  . ROBOTIC ASSISTED TOTAL HYSTERECTOMY WITH BILATERAL SALPINGO OOPHERECTOMY Bilateral 11/03/2017   Procedure: XI ROBOTIC ASSISTED TOTAL HYSTERECTOMY WITH BILATERAL SALPINGO OOPHORECTOMY;  Surgeon: [name], MD;  Location: [facility];  Service: Gynecology;  Laterality: Bilateral;    I have reviewed the social history  and family history with the patient and they are unchanged from previous note.  ALLERGIES:  is allergic to amoxicillin; other; chlorhexidine gluconate; and nicotine.  MEDICATIONS:  Current Outpatient Medications  Medication Sig Dispense Refill  . Famotidine (PEPCID PO) Take 1 tablet by mouth daily as needed (acid reflux).    Marland Kitchen HYDROcodone-acetaminophen (NORCO) 5-325 MG tablet Take 1-2 tablets by mouth every 4 (four) hours as needed for moderate pain. 40 tablet 0  . hydrocortisone cream 1 % Apply 1 application topically daily as needed (for redness on face).    Marland Kitchen ibuprofen (ADVIL,MOTRIN) 200 MG tablet Take 200-400 mg by mouth every 6 (six) hours as needed for headache (pain).    . methocarbamol (ROBAXIN) 500 MG tablet Take 1 tablet (500 mg total) by mouth every 8 (eight) hours as needed for up to 30 doses for muscle spasms. 30 tablet 0   No current facility-administered medications for this visit.     PHYSICAL EXAMINATION: ECOG PERFORMANCE STATUS: 0 - Asymptomatic  No vitals taken today, Exam not performed today  LABORATORY DATA:  I have reviewed the data as listed CBC Latest Ref Rng & Units 06/18/2018 04/14/2018 03/11/2018  WBC 4.0 - 10.5 K/uL 7.3 6.5 6.8  Hemoglobin 12.0 - 15.0 g/dL 13.9 12.7 12.2  Hematocrit 36.0 - 46.0 % 42.8 37.4 34.3  Platelets 150 - 400 K/uL 232 215 212     CMP Latest Ref Rng & Units 06/18/2018 04/14/2018 02/11/2018  Glucose 70 - 99 mg/dL 78 93 92  BUN 6 - 20 mg/dL 21(H) 16 20  Creatinine 0.44 - 1.00 mg/dL 0.95 0.97 0.93  Sodium 135 - 145 mmol/L 139 140 139  Potassium 3.5 - 5.1 mmol/L 3.8 3.7 3.6  Chloride 98 - 111 mmol/L 106 105 105  CO2 22 - 32 mmol/L 22 24 24   Calcium 8.9 - 10.3 mg/dL 9.5 9.3 9.3  Total Protein 6.5 - 8.1 g/dL - 7.2 6.8  Total Bilirubin 0.3 - 1.2 mg/dL - 0.3 1.0  Alkaline Phos 38 - 126 U/L - 83 92  AST 15 - 41 U/L - 25 24  ALT 0 - 44 U/L - 38 35      RADIOGRAPHIC STUDIES: I have personally reviewed the radiological images as listed  and agreed with the findings in the report. No results found.   ASSESSMENT & PLAN:  CARLEEN RHUE is a 43 y.o. female with   1. 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 She is s/p neoadjuvant chemo, AC-CT, B/l mastectomy and reconstruction surgery, adjuvant radiation and adjuvant Xeloda.  -Her tumor was weakly ER positive on initial biopsy, and became triple negative after neoadjuvant chemotherapy. I think the benefit of antiestrogen therapy is  very small and I do not recommend it.   -She completed total hysterectomy and BSO on 11/03/17, pathology was benign. -She is interested in the clinical trial for adjuvant immunotherapy Keytruda in triple negative cancer. She was screened but not eligible for the clinical trial.  -She is clinically doing well. She has intermittent hot flashes, fewer UTI's and has been having peripheral swelling of hands and feet. I encouraged her to lose weight and exercise. I encouraged her to try cranberry pills to decrease risk of infection, and using compression stocks for leg edema   -I discussed with her there is no clinical concern for recurrence. Her risk of breast cancer local recurrence is very lower after b/l mastectomy. We will continue surveillance.  -F/u in 3 months    2. 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 Analysis also detecteda Variant of Uncertain Significance (VUS) in the NBN gene calledc.628G>T (p.Val210Phe). -She underwent b/l mastectomy with Dr. Donne Hazel on 07/16/17 and will have  Reconstruction with plastic surgeon Dr. Ashley Mariner.  -She underwent total hysterectomy and BSO with Dr. Denman George on 11/03/17.  -I previously explained when her children get older they need to get tested and start cancer screenings  earlier.   3. Mood swing, Hot flashes   -Secondary to BSO. -She has been on Celexa which has helped. Her mood is back to normal and hot flashes improved. Will continue  -She will is no longer on Celexa or Effexor. Her mood swings are manageable and hot flashes now intermittent.   4. Peripheral bilateral edema -Intermittent, more in legs -I encouraged her to elevates her legs with sitting down and to use compression socks to help control the swelling.  -She did have weight gain with and after chemo. I encouraged her to exercise and lose weight to reduce her swelling. I encouraged her to have healthy diet as well.   PLAN:  -She is clinically doing well, will continue surveillance  -lab and f/u in 3 months    No problem-specific Assessment & Plan notes found for this encounter.   No orders of the defined types were placed in this encounter.  I discussed the assessment and treatment plan with the patient. The patient was provided an opportunity to ask questions and all were answered. The patient agreed with the plan and demonstrated an understanding of the instructions.  The patient was advised to call back or seek an in-person evaluation if the symptoms worsen or if the condition fails to improve as anticipated.   I provided 20 minutes of face-to-face video visit time during this encounter, and > 50% was spent counseling as documented under my assessment & plan.    [name], MD 10/12/2018   I, Joslyn Devon, am acting as scribe for [name], MD.   I have reviewed the above documentation for accuracy and completeness, and I agree with the above.

## 2018-10-12 ENCOUNTER — Encounter: Payer: Self-pay | Admitting: Hematology

## 2018-10-12 ENCOUNTER — Telehealth: Payer: Self-pay | Admitting: Hematology

## 2018-10-12 ENCOUNTER — Inpatient Hospital Stay: Payer: Medicaid Other | Attending: Hematology | Admitting: Hematology

## 2018-10-12 DIAGNOSIS — C50412 Malignant neoplasm of upper-outer quadrant of left female breast: Secondary | ICD-10-CM

## 2018-10-12 DIAGNOSIS — Z1501 Genetic susceptibility to malignant neoplasm of breast: Secondary | ICD-10-CM

## 2018-10-12 DIAGNOSIS — Z170 Estrogen receptor positive status [ER+]: Secondary | ICD-10-CM

## 2018-10-12 DIAGNOSIS — Z1509 Genetic susceptibility to other malignant neoplasm: Secondary | ICD-10-CM | POA: Diagnosis not present

## 2018-10-12 NOTE — Telephone Encounter (Signed)
Scheduled appt per 5/5 los.  A calendar will be mailed out.

## 2018-10-14 ENCOUNTER — Ambulatory Visit: Payer: Medicaid Other | Admitting: Hematology

## 2018-10-14 ENCOUNTER — Other Ambulatory Visit: Payer: Medicaid Other

## 2019-01-13 ENCOUNTER — Telehealth: Payer: Self-pay | Admitting: Hematology

## 2019-01-13 ENCOUNTER — Inpatient Hospital Stay: Payer: Medicaid Other

## 2019-01-13 ENCOUNTER — Inpatient Hospital Stay: Payer: Medicaid Other | Admitting: Hematology

## 2019-01-13 NOTE — Telephone Encounter (Signed)
Returned call re rescheduling 8/7 lab/fu. Confirmed with patient lab/fu 8/28.

## 2019-02-01 ENCOUNTER — Other Ambulatory Visit: Payer: Self-pay

## 2019-02-01 ENCOUNTER — Encounter: Payer: Self-pay | Admitting: Internal Medicine

## 2019-02-01 ENCOUNTER — Ambulatory Visit: Payer: Medicaid Other | Admitting: Internal Medicine

## 2019-02-01 VITALS — BP 130/82 | HR 95 | Temp 98.7°F | Wt 203.8 lb

## 2019-02-01 DIAGNOSIS — Z90710 Acquired absence of both cervix and uterus: Secondary | ICD-10-CM

## 2019-02-01 DIAGNOSIS — Z90722 Acquired absence of ovaries, bilateral: Secondary | ICD-10-CM

## 2019-02-01 DIAGNOSIS — Z853 Personal history of malignant neoplasm of breast: Secondary | ICD-10-CM

## 2019-02-01 DIAGNOSIS — Z6834 Body mass index (BMI) 34.0-34.9, adult: Secondary | ICD-10-CM

## 2019-02-01 DIAGNOSIS — E669 Obesity, unspecified: Secondary | ICD-10-CM | POA: Diagnosis not present

## 2019-02-01 DIAGNOSIS — T50905A Adverse effect of unspecified drugs, medicaments and biological substances, initial encounter: Secondary | ICD-10-CM

## 2019-02-01 DIAGNOSIS — R635 Abnormal weight gain: Secondary | ICD-10-CM | POA: Insufficient documentation

## 2019-02-01 DIAGNOSIS — Z9221 Personal history of antineoplastic chemotherapy: Secondary | ICD-10-CM | POA: Diagnosis not present

## 2019-02-01 DIAGNOSIS — Z9010 Acquired absence of unspecified breast and nipple: Secondary | ICD-10-CM

## 2019-02-01 DIAGNOSIS — N951 Menopausal and female climacteric states: Secondary | ICD-10-CM | POA: Insufficient documentation

## 2019-02-01 DIAGNOSIS — Z923 Personal history of irradiation: Secondary | ICD-10-CM | POA: Diagnosis not present

## 2019-02-01 MED ORDER — VENLAFAXINE HCL ER 75 MG PO CP24: 75.0000 mg | ORAL_CAPSULE | Freq: Every day | ORAL | 2 refills | Status: DC

## 2019-02-01 MED FILL — VENLAFAXINE HCL ER 75 MG CA: 75 | 30 days supply | Qty: 30 | Fill #0

## 2019-02-01 NOTE — Assessment & Plan Note (Addendum)
Presents with vasomotor symptoms related to estrogen insufficiency after salpingo-oophrectomy due to BRCA1 + status. Complains of hot flashes, mood swings and lower extremity swelling. Kathryn Stanley states she read about effectiveness of estrogen therapy and wanted to know if she was appropriate. Discussed in detail regarding contraindication to estrogen therapy with breast cancer and risk of re-occurrence. Discussed more appropriate treatment option with SSRI/SNRI. She mentions discontinuing citalopram in the past due to side effects but is willing to try other medications. States she would also like to try non-pharmaceutical interventions as well.  - Start venlafaxine XR 22m daily - Return in 3 months - Ambulatory referral to behavioral health made for mood swings

## 2019-02-01 NOTE — Progress Notes (Signed)
CC: Hot flashes  HPI: Kathryn Stanley is a 43 y.o. F w/ PMH of BRCA breast ca s/p mastectomy, radiation, chemo and total hysterectomy w/ bilateral salpingo-oorectomy presenting with complaints of hot flashes.  She states that ever since her hysterectomy she has been experiencing mood swings, lower extremity swelling, hot flashes.  She mentions being on citalopram in the past which she discontinued due to side effects including drowsiness, weight gain, "not feeling right".  She states that she has been reading up on menopausal symptoms on Facebook groups and was interested in receiving hormone therapy.  She states her oncologist as mentioned that hormone therapy will be contraindicated for her but she had difficulty understanding due to medical jargon.  She mentions using supportive care with cold water and air conditioning at home.  She also mentions having a lot of mood swings that is been bothering both her and her appointment.  Past Medical History:  Diagnosis Date  . Breast cancer, left breast ([place]) dx'd 01/2017  . Complication of anesthesia   . Dermatologic problem    possible psoriasis. Pt has not had been diagnosed by dermatology.  . 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 GERD (gastroesophageal reflux disease)   . PONV (postoperative nausea and vomiting)   . Tobacco dependence    trying to quit with nicotene patches  . UTI (urinary tract infection) 04/24/2017  . Viral upper respiratory infection 03/11/2018   Review of Systems: Review of Systems  Constitutional: Negative for chills, fever, malaise/fatigue and weight loss.  Respiratory: Negative for cough and shortness of breath.   Cardiovascular: Positive for leg swelling. Negative for chest pain.  Gastrointestinal: Negative for constipation, diarrhea, nausea and vomiting.  Genitourinary: Negative for dysuria, frequency and urgency.  Neurological: Negative  for tingling and headaches.  Psychiatric/Behavioral: Positive for depression.    Physical Exam: Vitals:   02/01/19 1422  BP: 130/82  Pulse: 95  Temp: 98.7 F (37.1 C)  TempSrc: Oral  Weight: 203 lb 12.8 oz (92.4 kg)   Physical Exam  Constitutional: She is oriented to person, place, and time. She appears well-developed and well-nourished. No distress.  Neck: Normal range of motion. Neck supple. No thyromegaly present.  Cardiovascular: Normal rate, regular rhythm, normal heart sounds and intact distal pulses.  No murmur heard. Respiratory: Effort normal and breath sounds normal. She has no wheezes. She has no rales.  GI: Soft. Bowel sounds are normal. She exhibits no distension. There is no abdominal tenderness.  Musculoskeletal: Normal range of motion.        General: No edema.  Neurological: She is alert and oriented to person, place, and time.  Skin: Skin is warm and dry.    Assessment & Plan:   Obesity (BMI 35.0-39.9 without comorbidity) Presents with frustration regarding weight gain despite regular exercise (daily Zoomba) and dietary changes (cut out all soda). Denies any hx of diabetes or heart disease. She expresses concern regarding initiation of SNRI/SSRI due to side effect of weight gain. Discussed that she had seen registered dietitian in the past and would like to reconnect.  - Referral for medical nutrition made  Menopausal symptoms Presents with vasomotor symptoms related to estrogen insufficiency after salpingo-oophrectomy due to BRCA1 + status. Complains of hot flashes, mood swings and lower extremity swelling. [name] states she read about effectiveness of estrogen therapy and wanted to know if she was appropriate. Discussed in detail regarding contraindication to  estrogen therapy with breast cancer and risk of re-occurrence. Discussed more appropriate treatment option with SSRI/SNRI. She mentions discontinuing citalopram in the past due to side effects but is  willing to try other medications. States she would also like to try non-pharmaceutical interventions as well.  - Start venlafaxine XR 3m daily - Return in 3 months - Ambulatory referral to behavioral health made for mood swings   Patient discussed with Dr. VEvette Doffing  -JGilberto Better PGY2 [facility] Pager: [phone]

## 2019-02-01 NOTE — Patient Instructions (Addendum)
Thank you for allowing [place] to provide your care today. Today we discussed your hot flashes    Today we made the following changes to your medications:  - Start venlafaxine ER 86m daily    Please follow-up in 3 months.    Should you have any questions or concerns please call the internal medicine clinic at [phone]      Menopause Menopause is the normal time of life when menstrual periods stop completely. It is usually confirmed by 12 months without a menstrual period. The transition to menopause (perimenopause) most often happens between the ages of 436and 559 During perimenopause, hormone levels change in your body, which can cause symptoms and affect your health. Menopause may increase your risk for:  Loss of bone (osteoporosis), which causes bone breaks (fractures).  Depression.  Hardening and narrowing of the arteries (atherosclerosis), which can cause heart attacks and strokes. What are the causes? This condition is usually caused by a natural change in hormone levels that happens as you get older. The condition may also be caused by surgery to remove both ovaries (bilateral oophorectomy). What increases the risk? This condition is more likely to start at an earlier age if you have certain medical conditions or treatments, including:  A tumor of the pituitary gland in the brain.  A disease that affects the ovaries and hormone production.  Radiation treatment for cancer.  Certain cancer treatments, such as chemotherapy or hormone (anti-estrogen) therapy.  Heavy smoking and excessive alcohol use.  Family history of early menopause. This condition is also more likely to develop earlier in women who are very thin. What are the signs or symptoms? Symptoms of this condition include:  Hot flashes.  Irregular menstrual periods.  Night sweats.  Changes in feelings about sex. This could be a decrease in sex drive or an increased comfort around your sexuality.  Vaginal  dryness and thinning of the vaginal walls. This may cause painful intercourse.  Dryness of the skin and development of wrinkles.  Headaches.  Problems sleeping (insomnia).  Mood swings or irritability.  Memory problems.  Weight gain.  Hair growth on the face and chest.  Bladder infections or problems with urinating. How is this diagnosed? This condition is diagnosed based on your medical history, a physical exam, your age, your menstrual history, and your symptoms. Hormone tests may also be done. How is this treated? In some cases, no treatment is needed. You and your health care provider should make a decision together about whether treatment is necessary. Treatment will be based on your individual condition and preferences. Treatment for this condition focuses on managing symptoms. Treatment may include:  Menopausal hormone therapy (MHT).  Medicines to treat specific symptoms or complications.  Acupuncture.  Vitamin or herbal supplements. Before starting treatment, make sure to let your health care provider know if you have a personal or family history of:  Heart disease.  Breast cancer.  Blood clots.  Diabetes.  Osteoporosis. Follow these instructions at home: Lifestyle  Do not use any products that contain nicotine or tobacco, such as cigarettes and e-cigarettes. If you need help quitting, ask your health care provider.  Get at least 30 minutes of physical activity on 5 or more days each week.  Avoid alcoholic and caffeinated beverages, as well as spicy foods. This may help prevent hot flashes.  Get 7-8 hours of sleep each night.  If you have hot flashes, try: ? Dressing in layers. ? Avoiding things that may trigger hot flashes,  such as spicy food, warm places, or stress. ? Taking slow, deep breaths when a hot flash starts. ? Keeping a fan in your home and office.  Find ways to manage stress, such as deep breathing, meditation, or journaling.  Consider  going to group therapy with other women who are having menopause symptoms. Ask your health care provider about recommended group therapy meetings. Eating and drinking  Eat a healthy, balanced diet that contains whole grains, lean protein, low-fat dairy, and plenty of fruits and vegetables.  Your health care provider may recommend adding more soy to your diet. Foods that contain soy include tofu, tempeh, and soy milk.  Eat plenty of foods that contain calcium and vitamin D for bone health. Items that are rich in calcium include low-fat milk, yogurt, beans, almonds, sardines, broccoli, and kale. Medicines  Take over-the-counter and prescription medicines only as told by your health care provider.  Talk with your health care provider before starting any herbal supplements. If prescribed, take vitamins and supplements as told by your health care provider. These may include: ? Calcium. Women age 82 and older should get 1,200 mg (milligrams) of calcium every day. ? Vitamin D. Women need 600-800 International Units of vitamin D each day. ? Vitamins B12 and B6. Aim for 50 micrograms of B12 and 1.5 mg of B6 each day. General instructions  Keep track of your menstrual periods, including: ? When they occur. ? How heavy they are and how long they last. ? How much time passes between periods.  Keep track of your symptoms, noting when they start, how often you have them, and how long they last.  Use vaginal lubricants or moisturizers to help with vaginal dryness and improve comfort during sex.  Keep all follow-up visits as told by your health care provider. This is important. This includes any group therapy or counseling. Contact a health care provider if:  You are still having menstrual periods after age 56.  You have pain during sex.  You have not had a period for 12 months and you develop vaginal bleeding. Get help right away if:  You have: ? Severe depression. ? Excessive vaginal  bleeding. ? Pain when you urinate. ? A fast or irregular heart beat (palpitations). ? Severe headaches. ? Abdomen (abdominal) pain or severe indigestion.  You fell and you think you have a broken bone.  You develop leg or chest pain.  You develop vision problems.  You feel a lump in your breast. Summary  Menopause is the normal time of life when menstrual periods stop completely. It is usually confirmed by 12 months without a menstrual period.  The transition to menopause (perimenopause) most often happens between the ages of 67 and 27.  Symptoms can be managed through medicines, lifestyle changes, and complementary therapies such as acupuncture.  Eat a balanced diet that is rich in nutrients to promote bone health and heart health and to manage symptoms during menopause. This information is not intended to replace advice given to you by your health care provider. Make sure you discuss any questions you have with your health care provider. Document Released: 08/16/2003 Document Revised: 05/08/2017 Document Reviewed: 06/28/2016 Elsevier Patient Education  2020 [employer].

## 2019-02-01 NOTE — Assessment & Plan Note (Addendum)
Presents with frustration regarding weight gain despite regular exercise (daily Zoomba) and dietary changes (cut out all soda). Denies any hx of diabetes or heart disease. She expresses concern regarding initiation of SNRI/SSRI due to side effect of weight gain. Discussed that she had seen registered dietitian in the past and would like to reconnect.  - Referral for medical nutrition made

## 2019-02-02 NOTE — Progress Notes (Signed)
Internal Medicine Clinic Attending ° °Case discussed with Dr. Lee at the time of the visit.  We reviewed the resident’s history and exam and pertinent patient test results.  I agree with the assessment, diagnosis, and plan of care documented in the resident’s note.  °

## 2019-02-02 NOTE — Addendum Note (Signed)
Addended by: Lalla Brothers T on: 02/02/2019 09:23 AM   Modules accepted: Level of Service

## 2019-02-03 NOTE — Progress Notes (Signed)
[place]   [phone] Fax:([phone]   Clinic Follow up Note   Patient Care Team: Mosetta Anis, MD as PCP - Joylene Draft, MD as Consulting Physician (General Surgery) [name], MD as Consulting Physician (Hematology) [name], MD as Consulting Physician (Radiation Oncology)  Date of Service:  02/04/2019  CHIEF COMPLAINT: Follow-upleft breast cancer  SUMMARY OF ONCOLOGIC HISTORY: Oncology History Overview Note  Cancer Staging Malignant neoplasm of upper-outer quadrant of left breast in female, estrogen receptor positive ([place]) Staging form: Breast, AJCC 8th Edition - Clinical stage from 12/29/2016: Stage IIIA (cT2, cN1, cM0, G3, ER: Positive, PR: Negative, HER2: Negative) - Signed by [name], MD on 01/07/2017 - Pathologic: No Stage Recommended (ypT1c, pN0, cM0, G2, ER-, PR-, HER2-) - Unsigned Cancer Staging Malignant neoplasm of upper-outer quadrant of left breast in female, estrogen receptor positive ([place]) Staging form: Breast, AJCC 8th Edition - Clinical stage from 12/29/2016: Stage IIIA (cT2, cN1, cM0, G3, ER: Positive, PR: Negative, HER2: Negative) - Signed by [name], MD on 01/07/2017 - Pathologic: No Stage Recommended (ypT1c, pN0, cM0, G2, ER-, PR-, HER2-) - Unsigned     Malignant neoplasm of upper-outer quadrant of left breast in female, estrogen receptor positive ([place])  12/25/2016 Mammogram   [place] and MM Diagnostic Breast Tomo Bilateral 12/25/16 IMPRESSION: 1. Suspicious mass 2.3 x 1.6 x 3.0 cm  in the 130 o'clock location of the left breast 8cm from the nipple, biopsy recommended. Adjacent simple cyst is 1.9 cm. 2. Suspicious left axillary lymph node 2.2cm for which biopsy is indicated. 3. Indeterminate group of calcifications in the upper central portion of the left breast for which biopsy is recommended.    12/29/2016 Initial Biopsy   Diagnosis 12/29/16 1. Breast, left, needle core biopsy, stereotactic, upper inner  quadrant - FIBROCYSTIC CHANGES INCLUDING APOCRINE METAPLASIA WITH CALCIFICATIONS - NO MALIGNANCY IDENTIFIED 2. Breast, left, needle core biopsy, ultrasound, 1:30 o'clock - INVASIVE MAMMARY CARCINOMA, G3 - SEE COMMENT 3. Lymph node, needle/core biopsy, ultrasound, left axillary - METASTIC CARCINOMA INVOLVING ONE LYMPH NODE (1/1) E-cadherin is positive supporting a ductal origin.    12/29/2016 Receptors her2   ER 15%+, weak staining  PR - HER2- Ki67 10%    12/29/2016 Initial Diagnosis   Malignant neoplasm of upper-outer quadrant of left breast in female, estrogen receptor positive ([place])   01/13/2017 Echocardiogram   ECHO 01/13/17 Study Conclusions - Left ventricle: The cavity size was normal. Systolic function was   normal. The estimated ejection fraction was in the range of 60%   to 65%. Wall motion was normal; there were no regional wall   motion abnormalities. Left ventricular diastolic function   parameters were normal. - Mitral valve: There was trivial regurgitation. - Right atrium: The atrium was mildly dilated. - Tricuspid valve: There was trivial regurgitation.    01/14/2017 Surgery   Port placement by Dr. Donne Hazel    01/15/2017 Imaging   Whole Body bone Scan 01/15/17 IMPRESSION: 1.  No significant abnormality identified.   01/15/2017 Imaging   CT CAP W Contrast 01/15/17 IMPRESSION: 1. Left lateral breast mass with pathologic left axillary adenopathy including a 2.0 cm left axillary lymph node. 2. Several hypodense lesions in the liver are likely benign lesions such as cysts. However, these are technically too small to characterize by CT. Possibilities for further workup include surveillance or hepatic protocol MRI with and without contrast. 3. There is also a tiny hypodense lesion in the spleen which is technically nonspecific but  statistically highly likely to be benign. 4.  Prominent stool throughout the colon favors constipation   01/16/2017 - 06/05/2017  Chemotherapy   Neoadjuvant chemo AC every 2 weeks for 4 cycles starting on 01/16/2017 and ended 02/27/17. Then proceed with weekly taxol for 12 treatments starting 03/13/17. Added Carboplatin AUC 2 on 04/03/17 to weekly taxol. Due to symptomatic thrombocytopenia we held Carbo since cycle 8. Cycle 9 was post-poned due to neutropenia and Granix was added starting 05/12/17. Added carbo back at reduced dose AUC 1.5 with cycle 10 on 05/22/17. 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   Analysis also detected a Variant of Uncertain Significance (VUS) in the NBN gene called c.628G>T (p.Val210Phe)  Update: The Variant of Uncertain Significance NBN c.628G>T (p.Val210Phe) has been reclassified to "Likely Benign." Report date is 04/09/2018.    06/12/2017 Imaging   MR BREAST BILATERAL WO CONTRAST IMPRESSION: 1. Significant positive response to neoadjuvant chemotherapy. The index carcinoma in the left breast has significantly decreased in size. Left axillary adenopathy has also significantly improved. 2. No other evidence of malignancy.   07/16/2017 Surgery   Pt underwent a bilateral mastectomy and left SLN biopsy with Dr. Donne Hazel   07/16/2017 Pathology Results   Diagnosis 07/16/17 1. Breast, simple mastectomy, Right Prophylactic - FIBROCYSTIC CHANGES INCLUDING APOCRINE METAPLASIA - DUCT ECTASIA - CALCIFICATIONS - NO MALIGNANCY IDENTIFIED 2. Lymph node, sentinel, biopsy, Left Axillary - NO CARCINOMA IDENTIFIED IN ONE LYMPH NODE (0/1) - SEE COMMENT 3. Lymph node, sentinel, biopsy, Left - NO CARCINOMA IDENTIFIED IN ONE LYMPH NODE (0/1) - SEE COMMENT 4. Breast, simple mastectomy, Left - INVASIVE DUCTAL CARCINOMA, NOTTINGHAM GRADE 2 OF 3, 1.3 CM - MARGINS UNINVOLVED BY CARCINOMA (2 CM POSTERIOR MARGIN) - PREVIOUS BIOPSY SITE CHANGES - CALCIFICATIONS - SEE ONCOLOGY TABLE BELOW    07/16/2017 Receptors her2   Results: IMMUNOHISTOCHEMICAL AND MORPHOMETRIC ANALYSIS PERFORMED MANUALLY Estrogen Receptor: 0%, NEGATIVE Progesterone Receptor: 0%, NEGATIVE Results: HER2 - NEGATIVE RATIO OF HER2/CEP17 SIGNALS 1.73 AVERAGE HER2 COPY NUMBER PER CELL 2.67   09/02/2017 - 10/16/2017 Radiation Therapy   Adjuvant radiation per Dr. Lisbeth Lawhorn    11/03/2017 Surgery   XI ROBOTIC ASSISTED TOTAL HYSTERECTOMY WITH BILATERAL SALPINGO OOPHORECTOMY  by Dr. Denman George and Dr. Delsa Sale 11/03/17   11/23/2017 - 02/08/2018 Chemotherapy   Adjuvant Xeloda 2092m BID 2 weeks on and 1 week off   06/25/2018 Surgery   Bilateral breast reconstruction with Dr. TIran Planas 06/25/18       CURRENT THERAPY:  Surveillance  INTERVAL HISTORY:  Kathryn GEARis here for a follow up of left breast cancer. She presents to the clinic alone. She notes she is doing well. She notes she started Effexor given by her PCP to help her hot flashes and mood swings. She started yesterday. She notes she continues to work at [employer] She notes she is able to tolerate work. She has been active with [employer] for the last 1.5 months. She feels frustrated because she is maintaining her weight and not loosing her weight. She notes mild b/l shoulder pain similar to when she first found out she had cancer. This has been going on for the past 1-2 weeks. She notes she did 4 weeks of PT after surgery. She notes other back muscle pain. She feels she drinks enough water.     REVIEW OF SYSTEMS:   Constitutional: Denies fevers, chills or  abnormal weight loss (+) hot flashes  Eyes: Denies blurriness of vision Ears, nose, mouth, throat, and face: Denies mucositis or sore throat Respiratory: Denies cough, dyspnea or wheezes Cardiovascular: Denies palpitation, chest discomfort or lower extremity swelling Gastrointestinal:  Denies nausea, heartburn or change in bowel habits Skin: Denies abnormal skin rashes MSK: (+) B/l shoulder pain (L>R)  (+) back muscular pain Lymphatics: Denies new lymphadenopathy or easy bruising Neurological:Denies numbness, tingling or new weaknesses Behavioral/Psych: (+) Mood swings  All other systems were reviewed with the patient and are negative.  MEDICAL HISTORY:  Past Medical History:  Diagnosis Date  . Breast cancer, left breast ([place]) dx'd 01/2017  . Complication of anesthesia   . Dermatologic problem    possible psoriasis. Pt has not had been diagnosed by dermatology.  . 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 GERD (gastroesophageal reflux disease)   . PONV (postoperative nausea and vomiting)   . Tobacco dependence    trying to quit with nicotene patches  . UTI (urinary tract infection) 04/24/2017  . Viral upper respiratory infection 03/11/2018    SURGICAL HISTORY: Past Surgical History:  Procedure Laterality Date  . APPLICATION OF A-CELL OF CHEST/ABDOMEN Right 06/25/2018   Procedure: ACELLULAR DERMIS TO RIGHT CHEST;  Surgeon: Irene Limbo, MD;  Location: [facility];  Service: Plastics;  Laterality: Right;  . BREAST BIOPSY Left 12/2016  . BREAST RECONSTRUCTION Bilateral 07/16/2017   BILATERAL BREAST RECONSTRUCTION WITH PLACEMENT OF TISSUE EXPANDER AND ALLODERM/notes 07/16/2017  . BREAST RECONSTRUCTION  06/25/2018  . CESAREAN SECTION  2002  . LATISSIMUS FLAP TO BREAST Left 06/25/2018   Procedure: LEFT LATISSIMUS FLAP TO LEFT CHEST;  Surgeon: Irene Limbo, MD;  Location: [facility];  Service: Plastics;  Laterality: Left;  Marland Kitchen MASTECTOMY Bilateral 07/16/2017   LEFT SKIN SPARING MASTECTOMY WITH LEFT RADIOACTIVE SEED TARGETED AXILLARY LYMPH NODE EXCISION AND AXILLARY SENTINEL LYMPH NODE BIOPSY; RIGHT PROPHYLACTIC SKIN SPARING MASTECTOMY [name] 07/16/2017  . MASTECTOMY WITH RADIOACTIVE SEED GUIDED EXCISION AND AXILLARY SENTINEL LYMPH NODE BIOPSY Left 07/16/2017   Procedure: LEFT SKIN SPARING MASTECTOMY WITH LEFT RADIOACTIVE SEED  TARGETED AXILLARY LYMPH NODE EXCISION AND AXILLARY SENTINEL LYMPH NODE BIOPSY;  Surgeon: [name], MD;  Location: [facility];  Service: General;  Laterality: Left;  . NIPPLE SPARING MASTECTOMY Right 07/16/2017   Procedure: RIGHT PROPHYLACTIC SKIN SPARING MASTECTOMY;  Surgeon: [name], MD;  Location: [facility];  Service: General;  Laterality: Right;  . PORT-A-CATH REMOVAL N/A 07/16/2017   Procedure: REMOVAL PORT-A-CATH;  Surgeon: [name], MD;  Location: [facility];  Service: General;  Laterality: N/A;  . PORTA CATH REMOVAL  07/16/2017  . PORTACATH PLACEMENT N/A 01/14/2017   Procedure: INSERTION PORT-A-CATH WITH [place];  Surgeon: [name], MD;  Location: [facility];  Service: General;  Laterality: N/A;  . REMOVAL OF BILATERAL TISSUE EXPANDERS WITH PLACEMENT OF BILATERAL BREAST IMPLANTS Bilateral 06/25/2018   Procedure: REMOVAL OF BILATERAL TISSUE EXPANDERS WITH PLACEMENT OF BILATERAL SILICONE BREAST IMPLANTS;  Surgeon: Irene Limbo, MD;  Location: [place];  Service: Plastics;  Laterality: Bilateral;  . ROBOTIC ASSISTED TOTAL HYSTERECTOMY WITH BILATERAL SALPINGO OOPHERECTOMY Bilateral 11/03/2017   Procedure: XI ROBOTIC ASSISTED TOTAL HYSTERECTOMY WITH BILATERAL SALPINGO OOPHORECTOMY;  Surgeon: [name], MD;  Location: [facility];  Service: Gynecology;  Laterality: Bilateral;    I have reviewed the social history and family history with the patient and they are unchanged from previous note.  ALLERGIES:  is allergic to  amoxicillin; other; chlorhexidine gluconate; and nicotine.  MEDICATIONS:  Current Outpatient Medications  Medication Sig Dispense Refill  . Famotidine (PEPCID PO) Take 1 tablet by mouth daily as needed (acid reflux).    Marland Kitchen HYDROcodone-acetaminophen (NORCO) 5-325 MG tablet Take 1-2 tablets by mouth every 4 (four) hours as needed for moderate pain. 40 tablet 0  . hydrocortisone cream 1 % Apply 1 application topically daily as needed (for redness on face).    Marland Kitchen ibuprofen  (ADVIL,MOTRIN) 200 MG tablet Take 200-400 mg by mouth every 6 (six) hours as needed for headache (pain).    . methocarbamol (ROBAXIN) 500 MG tablet Take 1 tablet (500 mg total) by mouth every 8 (eight) hours as needed for up to 30 doses for muscle spasms. 30 tablet 0  . venlafaxine XR (EFFEXOR-XR) 75 MG 24 hr capsule Take 1 capsule (75 mg total) by mouth daily with breakfast. 30 capsule 2   No current facility-administered medications for this visit.     PHYSICAL EXAMINATION: ECOG PERFORMANCE STATUS: 0 - Asymptomatic  Vitals:   02/04/19 1311  BP: 127/77  Pulse: 67  Resp: 18  Temp: 98.2 F (36.8 C)  SpO2: 98%   Filed Weights   02/04/19 1311  Weight: 200 lb 14.4 oz (91.1 kg)    GENERAL:alert, no distress and comfortable SKIN: skin color, texture, turgor are normal, no rashes or significant lesions EYES: normal, Conjunctiva are pink and non-injected, sclera clear  NECK: supple, thyroid normal size, non-tender, without nodularity LYMPH:  no palpable lymphadenopathy in the cervical, axillary  LUNGS: clear to auscultation and percussion with normal breathing effort HEART: regular rate & rhythm and no murmurs and no lower extremity edema ABDOMEN:abdomen soft, non-tender and normal bowel sounds Musculoskeletal:no cyanosis of digits and no clubbing  NEURO: alert & oriented x 3 with fluent speech, no focal motor/sensory deficits BREAST: s/p b/l mastectomy: Surgical incisions healed well (+) Mild skin hyperpigmentation but mostly skin color back to normal. No palpable mass, nodules or adenopathy bilaterally. Breast exam benign.   LABORATORY DATA:  I have reviewed the data as listed CBC Latest Ref Rng & Units 02/04/2019 06/18/2018 04/14/2018  WBC 4.0 - 10.5 K/uL 6.9 7.3 6.5  Hemoglobin 12.0 - 15.0 g/dL 14.1 13.9 12.7  Hematocrit 36.0 - 46.0 % 41.6 42.8 37.4  Platelets 150 - 400 K/uL 226 232 215     CMP Latest Ref Rng & Units 02/04/2019 06/18/2018 04/14/2018  Glucose 70 - 99 mg/dL 86 78  93  BUN 6 - 20 mg/dL 18 21(H) 16  Creatinine 0.44 - 1.00 mg/dL 0.99 0.95 0.97  Sodium 135 - 145 mmol/L 140 139 140  Potassium 3.5 - 5.1 mmol/L 4.2 3.8 3.7  Chloride 98 - 111 mmol/L 106 106 105  CO2 22 - 32 mmol/L _0 Calcium 8.9 - 10.3 mg/dL 9.1 9.5 9.3  Total Protein 6.5 - 8.1 g/dL 7.5 - 7.2  Total Bilirubin 0.3 - 1.2 mg/dL 0.3 - 0.3  Alkaline Phos 38 - 126 U/L 92 - 83  AST 15 - 41 U/L 24 - 25  ALT 0 - 44 U/L 35 - 38      RADIOGRAPHIC STUDIES: I have personally reviewed the radiological images as listed and agreed with the findings in the report. No results found.   ASSESSMENT & PLAN:  Kathryn Stanley is a 43 y.o. female with   1. 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 She is s/p neoadjuvant chemo, AC-CT, B/l mastectomy and reconstruction surgery, adjuvant radiation and adjuvant Xeloda.  -Her tumor was weakly ER positive on initial biopsy, and became triple negative after neoadjuvant chemotherapy. I think the benefit of antiestrogen therapy is very small and I did not recommend it.  -She completed total hysterectomy and BSO on 11/03/17, pathology was benign. -She was interested in the clinical trial for adjuvant immunotherapy Keytruda in triple negative cancer.Shewas screened butnot eligible for the clinical trial. -She is clinically doing well. She started Effexor for her hot flashes and mood swings. She has recent b/l shoulder pain (L>R) that feels similar to the pain she had at cancer diagnosis. For her muscular pain she can use tylenol. I encouraged her to drink enough water and take calcium.  -Labs reviewed, CBC and CMP WNL. Physical exam unremarkable. There is no clinical concern for recurrence -I instructed her to avoid soy products and those supplement with precursors to estrogen. She is agreeable.   -Continue surveillance.  -f/u in 6 months   2. 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 Analysis also detecteda Variant of Uncertain Significance (VUS) in the NBN gene calledc.628G>T (p.Val210Phe). -She underwent b/l mastectomy with Dr. Donne Hazel on 07/16/17 and will have Reconstruction with plastic surgeon Dr. Ashley Mariner.  -She underwent total hysterectomy and BSO with Dr. Denman George on 11/03/17.  -I previously explained when her children get older they need to get tested and start cancer screenings earlier.    3. Mood swing, Hot flashes   -Secondary to BSO. -She has been on Celexa which has helped. Her mood is back to normal and hot flashes improved. Will continue  -Her PCP started her on Effexor yesterday (02/03/19) to help her hot flashes and mood swings. I discussed if not enough she can slowly titrate her dose up. I discussed this will take a few weeks to take effect.  -I also discussed her weight is likely to increase postmenopause. She remains very active but with no weight loss. I recommend she also watch her diet and eat less red meat, less carbohydrates and more vegetables.   4. Bone Health  -Given she is s/p BSO I discussed her bone density will start to decrease postmenopausal. Will obtain baseline DEXA in 2021.   PLAN:  -She is clinically doing well, will continue surveillance -lab and f/u in 6 months   No problem-specific Assessment & Plan notes found for this encounter.   No orders of the defined types were placed in this encounter.  All questions were answered. The patient knows to call the clinic with any problems, questions or concerns. No barriers to learning was detected. I spent 15 minutes counseling the patient face to face. The total time spent in the appointment was 20 minutes and more than 50% was on counseling and review of test results     [name], MD 02/04/2019   I, Joslyn Devon,  am acting as scribe for [name], MD.   I have reviewed the above documentation for accuracy and completeness, and I agree with the above.

## 2019-02-04 ENCOUNTER — Inpatient Hospital Stay (HOSPITAL_BASED_OUTPATIENT_CLINIC_OR_DEPARTMENT_OTHER): Payer: Medicaid Other | Admitting: Hematology

## 2019-02-04 ENCOUNTER — Telehealth: Payer: Self-pay | Admitting: Dietician

## 2019-02-04 ENCOUNTER — Other Ambulatory Visit: Payer: Self-pay

## 2019-02-04 ENCOUNTER — Encounter: Payer: Self-pay | Admitting: Dietician

## 2019-02-04 ENCOUNTER — Inpatient Hospital Stay: Payer: Medicaid Other | Attending: Hematology

## 2019-02-04 VITALS — BP 127/77 | HR 67 | Temp 98.2°F | Resp 18 | Ht 64.0 in | Wt 200.9 lb

## 2019-02-04 DIAGNOSIS — Z1501 Genetic susceptibility to malignant neoplasm of breast: Secondary | ICD-10-CM | POA: Diagnosis not present

## 2019-02-04 DIAGNOSIS — Z853 Personal history of malignant neoplasm of breast: Secondary | ICD-10-CM | POA: Diagnosis present

## 2019-02-04 DIAGNOSIS — R232 Flushing: Secondary | ICD-10-CM | POA: Insufficient documentation

## 2019-02-04 DIAGNOSIS — C50412 Malignant neoplasm of upper-outer quadrant of left female breast: Secondary | ICD-10-CM

## 2019-02-04 DIAGNOSIS — M549 Dorsalgia, unspecified: Secondary | ICD-10-CM | POA: Diagnosis not present

## 2019-02-04 DIAGNOSIS — Z923 Personal history of irradiation: Secondary | ICD-10-CM | POA: Diagnosis not present

## 2019-02-04 DIAGNOSIS — Z170 Estrogen receptor positive status [ER+]: Secondary | ICD-10-CM

## 2019-02-04 DIAGNOSIS — K219 Gastro-esophageal reflux disease without esophagitis: Secondary | ICD-10-CM | POA: Insufficient documentation

## 2019-02-04 DIAGNOSIS — N3001 Acute cystitis with hematuria: Secondary | ICD-10-CM

## 2019-02-04 DIAGNOSIS — Z79899 Other long term (current) drug therapy: Secondary | ICD-10-CM | POA: Insufficient documentation

## 2019-02-04 DIAGNOSIS — Z9013 Acquired absence of bilateral breasts and nipples: Secondary | ICD-10-CM | POA: Diagnosis not present

## 2019-02-04 DIAGNOSIS — Z90722 Acquired absence of ovaries, bilateral: Secondary | ICD-10-CM | POA: Diagnosis not present

## 2019-02-04 DIAGNOSIS — Z90710 Acquired absence of both cervix and uterus: Secondary | ICD-10-CM | POA: Insufficient documentation

## 2019-02-04 DIAGNOSIS — F17210 Nicotine dependence, cigarettes, uncomplicated: Secondary | ICD-10-CM | POA: Diagnosis not present

## 2019-02-04 DIAGNOSIS — Z9221 Personal history of antineoplastic chemotherapy: Secondary | ICD-10-CM | POA: Insufficient documentation

## 2019-02-04 LAB — CBC WITH DIFFERENTIAL (CANCER CENTER ONLY)
Abs Immature Granulocytes: 0.01 10*3/uL (ref 0.00–0.07)
Basophils Absolute: 0 10*3/uL (ref 0.0–0.1)
Basophils Relative: 1 %
Eosinophils Absolute: 0.1 10*3/uL (ref 0.0–0.5)
Eosinophils Relative: 2 %
HCT: 41.6 % (ref 36.0–46.0)
Hemoglobin: 14.1 g/dL (ref 12.0–15.0)
Immature Granulocytes: 0 %
Lymphocytes Relative: 28 %
Lymphs Abs: 1.9 10*3/uL (ref 0.7–4.0)
MCH: 28.5 pg (ref 26.0–34.0)
MCHC: 33.9 g/dL (ref 30.0–36.0)
MCV: 84 fL (ref 80.0–100.0)
Monocytes Absolute: 0.5 10*3/uL (ref 0.1–1.0)
Monocytes Relative: 8 %
Neutro Abs: 4.3 10*3/uL (ref 1.7–7.7)
Neutrophils Relative %: 61 %
Platelet Count: 226 10*3/uL (ref 150–400)
RBC: 4.95 MIL/uL (ref 3.87–5.11)
RDW: 13 % (ref 11.5–15.5)
WBC Count: 6.9 10*3/uL (ref 4.0–10.5)
nRBC: 0 % (ref 0.0–0.2)

## 2019-02-04 LAB — CMP (CANCER CENTER ONLY)
ALT: 35 U/L (ref 0–44)
AST: 24 U/L (ref 15–41)
Albumin: 4.2 g/dL (ref 3.5–5.0)
Alkaline Phosphatase: 92 U/L (ref 38–126)
Anion gap: 8 (ref 5–15)
BUN: 18 mg/dL (ref 6–20)
CO2: 26 mmol/L (ref 22–32)
Calcium: 9.1 mg/dL (ref 8.9–10.3)
Chloride: 106 mmol/L (ref 98–111)
Creatinine: 0.99 mg/dL (ref 0.44–1.00)
GFR, Est AFR Am: >60 mL/min (ref >60)
GFR, Estimated: >60 mL/min (ref >60)
Glucose, Bld: 86 mg/dL (ref 70–99)
Potassium: 4.2 mmol/L (ref 3.5–5.1)
Sodium: 140 mmol/L (ref 135–145)
Total Bilirubin: 0.3 mg/dL (ref 0.3–1.2)
Total Protein: 7.5 g/dL (ref 6.5–8.1)

## 2019-02-04 NOTE — Telephone Encounter (Signed)
Left voicemail for return call and Mailed letter to patient. Debera Lat, RD 02/04/2019 2:21 PM.

## 2019-02-05 ENCOUNTER — Encounter: Payer: Self-pay | Admitting: Hematology

## 2019-02-06 LAB — URINE CULTURE: Culture: 50000 — AB

## 2019-02-07 ENCOUNTER — Telehealth: Payer: Self-pay | Admitting: Hematology

## 2019-02-07 NOTE — Telephone Encounter (Signed)
Scheduled appt per 8/28 los.  Left a voice message of appt date and time.

## 2019-02-08 ENCOUNTER — Telehealth: Payer: Self-pay | Admitting: *Deleted

## 2019-02-08 ENCOUNTER — Other Ambulatory Visit: Payer: Self-pay | Admitting: Nurse Practitioner

## 2019-02-08 DIAGNOSIS — N3000 Acute cystitis without hematuria: Secondary | ICD-10-CM

## 2019-02-08 MED ORDER — CIPROFLOXACIN HCL 500 MG PO TABS: 500.0000 mg | ORAL_TABLET | Freq: Two times a day (BID) | ORAL | 0 refills | Status: DC

## 2019-02-08 MED FILL — CIPROFLOXACIN HCL 500 MG TA: 500 | 5 days supply | Qty: 10 | Fill #0

## 2019-02-08 NOTE — Telephone Encounter (Signed)
-----   Message from Alla Feeling, NP sent at 02/08/2019 11:19 AM EDT ----- [place],  Please let her know I called in cipro 500 mg BID x5 days for positive urine culture.  Thanks, Regan Rakers

## 2019-02-08 NOTE — Telephone Encounter (Signed)
Per Regan Rakers B NP, called pt to inform Cipro 532m was sent to pharmacy for positive urine culture. Husband was informed and verbalized understanding.

## 2019-02-15 ENCOUNTER — Other Ambulatory Visit: Payer: Self-pay | Admitting: *Deleted

## 2019-02-15 ENCOUNTER — Other Ambulatory Visit: Payer: Self-pay | Admitting: Internal Medicine

## 2019-02-15 ENCOUNTER — Telehealth: Payer: Self-pay | Admitting: Licensed Clinical Social Worker

## 2019-02-15 DIAGNOSIS — N951 Menopausal and female climacteric states: Secondary | ICD-10-CM

## 2019-02-15 MED ORDER — VENLAFAXINE HCL ER 75 MG PO CP24: 75.0000 mg | ORAL_CAPSULE | Freq: Every day | ORAL | 2 refills | Status: DC

## 2019-02-15 MED FILL — VENLAFAXINE HCL ER 75 MG CA: 75 | 30 days supply | Qty: 30 | Fill #1

## 2019-02-15 NOTE — Telephone Encounter (Signed)
Patient was contacted due to a referral from her doctor. Patient did not answer, and a vm was left for the patient.

## 2019-02-15 NOTE — Telephone Encounter (Signed)
rtc no answer, lm for rtc

## 2019-02-15 NOTE — Telephone Encounter (Signed)
This was completed by dr Philipp Ovens today

## 2019-02-15 NOTE — Telephone Encounter (Signed)
Pt calling back requesting nurse call back to regarding medication [phone]

## 2019-02-15 NOTE — Telephone Encounter (Signed)
Has 17 tablets, filled 02/01/19, states she dropped the pills and ran over them, nurse could hear pt taking them out of bottle and dropping back in, ask pt to take back to [facility] and possibly they could do an override fill. Called pharm, they will need a new script or a VO for replacement

## 2019-02-15 NOTE — Telephone Encounter (Signed)
Refill Request   venlafaxine XR (EFFEXOR-XR) 75 MG 24 hr capsule  [facility] - [place], [place] - [address]

## 2019-02-15 NOTE — Telephone Encounter (Signed)
Pt requesting a nurse to call back [phone]

## 2019-03-04 NOTE — Addendum Note (Signed)
Addended by: Hulan Fray on: 03/04/2019 03:42 PM   Modules accepted: Orders

## 2019-03-08 ENCOUNTER — Encounter: Payer: Self-pay | Admitting: Licensed Clinical Social Worker

## 2019-03-21 MED FILL — VENLAFAXINE HCL ER 75 MG CA: 75 | 30 days supply | Qty: 30 | Fill #2

## 2019-04-11 NOTE — Addendum Note (Signed)
Addended by: Hulan Fray on: 04/11/2019 06:31 PM   Modules accepted: Orders

## 2019-04-25 MED FILL — VENLAFAXINE HCL ER 75 MG CA: 75 | 30 days supply | Qty: 30 | Fill #0

## 2019-06-09 MED FILL — VENLAFAXINE HCL ER 75 MG CA: 75 | 30 days supply | Qty: 30 | Fill #1

## 2019-06-16 ENCOUNTER — Encounter (HOSPITAL_BASED_OUTPATIENT_CLINIC_OR_DEPARTMENT_OTHER): Payer: Self-pay | Admitting: Plastic Surgery

## 2019-06-16 ENCOUNTER — Other Ambulatory Visit: Payer: Self-pay

## 2019-06-21 ENCOUNTER — Other Ambulatory Visit (HOSPITAL_COMMUNITY)
Admission: RE | Admit: 2019-06-21 | Discharge: 2019-06-21 | Disposition: A | Discharge status: Home / Self Care | Payer: Medicaid Other | Source: Ambulatory Visit | Attending: Plastic Surgery | Admitting: Plastic Surgery

## 2019-06-21 DIAGNOSIS — Z01812 Encounter for preprocedural laboratory examination: Secondary | ICD-10-CM | POA: Diagnosis present

## 2019-06-21 DIAGNOSIS — Z20822 Contact with and (suspected) exposure to covid-19: Secondary | ICD-10-CM | POA: Insufficient documentation

## 2019-06-22 LAB — NOVEL CORONAVIRUS, NAA (HOSP ORDER, SEND-OUT TO REF LAB; TAT 18-24 HRS): SARS-CoV-2, NAA: NOT DETECTED

## 2019-06-22 NOTE — Progress Notes (Signed)

## 2019-06-24 ENCOUNTER — Ambulatory Visit (HOSPITAL_BASED_OUTPATIENT_CLINIC_OR_DEPARTMENT_OTHER): Payer: Medicaid Other | Admitting: Anesthesiology

## 2019-06-24 ENCOUNTER — Other Ambulatory Visit: Payer: Self-pay

## 2019-06-24 ENCOUNTER — Ambulatory Visit (HOSPITAL_BASED_OUTPATIENT_CLINIC_OR_DEPARTMENT_OTHER)
Admission: RE | Admit: 2019-06-24 | Discharge: 2019-06-24 | Disposition: A | Discharge status: Home / Self Care | Payer: Medicaid Other | Attending: Plastic Surgery | Admitting: Plastic Surgery

## 2019-06-24 ENCOUNTER — Encounter (HOSPITAL_BASED_OUTPATIENT_CLINIC_OR_DEPARTMENT_OTHER)
Admission: RE | Disposition: A | Discharge status: Home / Self Care | Payer: Self-pay | Source: Home / Self Care | Attending: Plastic Surgery

## 2019-06-24 ENCOUNTER — Encounter (HOSPITAL_BASED_OUTPATIENT_CLINIC_OR_DEPARTMENT_OTHER): Payer: Self-pay | Admitting: Plastic Surgery

## 2019-06-24 DIAGNOSIS — Z9221 Personal history of antineoplastic chemotherapy: Secondary | ICD-10-CM | POA: Diagnosis not present

## 2019-06-24 DIAGNOSIS — Z923 Personal history of irradiation: Secondary | ICD-10-CM | POA: Diagnosis not present

## 2019-06-24 DIAGNOSIS — C773 Secondary and unspecified malignant neoplasm of axilla and upper limb lymph nodes: Secondary | ICD-10-CM | POA: Diagnosis not present

## 2019-06-24 DIAGNOSIS — T8542XA Displacement of breast prosthesis and implant, initial encounter: Secondary | ICD-10-CM | POA: Insufficient documentation

## 2019-06-24 DIAGNOSIS — Z87891 Personal history of nicotine dependence: Secondary | ICD-10-CM | POA: Insufficient documentation

## 2019-06-24 DIAGNOSIS — Y831 Surgical operation with implant of artificial internal device as the cause of abnormal reaction of the patient, or of later complication, without mention of misadventure at the time of the procedure: Secondary | ICD-10-CM | POA: Insufficient documentation

## 2019-06-24 DIAGNOSIS — Z170 Estrogen receptor positive status [ER+]: Secondary | ICD-10-CM | POA: Insufficient documentation

## 2019-06-24 DIAGNOSIS — C50412 Malignant neoplasm of upper-outer quadrant of left female breast: Secondary | ICD-10-CM | POA: Diagnosis not present

## 2019-06-24 DIAGNOSIS — Z1501 Genetic susceptibility to malignant neoplasm of breast: Secondary | ICD-10-CM | POA: Diagnosis not present

## 2019-06-24 DIAGNOSIS — Z79899 Other long term (current) drug therapy: Secondary | ICD-10-CM | POA: Insufficient documentation

## 2019-06-24 DIAGNOSIS — Z9013 Acquired absence of bilateral breasts and nipples: Secondary | ICD-10-CM | POA: Insufficient documentation

## 2019-06-24 HISTORY — PX: LIPOSUCTION: SHX10

## 2019-06-24 HISTORY — PX: BREAST IMPLANT EXCHANGE: SHX6296

## 2019-06-24 SURGERY — REPLACEMENT, IMPLANT, BREAST
Anesthesia: General | Site: Breast | Laterality: Right

## 2019-06-24 MED ORDER — PROPOFOL 10 MG/ML IV BOLUS
INTRAVENOUS | Status: AC
  Filled 2019-06-24: qty 20

## 2019-06-24 MED ORDER — METHOCARBAMOL 500 MG PO TABS: 500.0000 mg | ORAL_TABLET | Freq: Three times a day (TID) | ORAL | 0 refills | Status: DC | PRN

## 2019-06-24 MED ORDER — GABAPENTIN 300 MG PO CAPS
ORAL_CAPSULE | ORAL | Status: AC
  Filled 2019-06-24: qty 1

## 2019-06-24 MED ORDER — SCOPOLAMINE 1 MG/3DAYS TD PT72
1.0000 | MEDICATED_PATCH | TRANSDERMAL | Status: DC
  Administered 2019-06-24: 1.5 mg via TRANSDERMAL

## 2019-06-24 MED ORDER — SULFAMETHOXAZOLE-TRIMETHOPRIM 800-160 MG PO TABS: 1.0000 | ORAL_TABLET | Freq: Two times a day (BID) | ORAL | 0 refills | Status: DC

## 2019-06-24 MED ORDER — FENTANYL CITRATE (PF) 100 MCG/2ML IJ SOLN
INTRAMUSCULAR | Status: AC
  Filled 2019-06-24: qty 2

## 2019-06-24 MED ORDER — MIDAZOLAM HCL 2 MG/2ML IJ SOLN
INTRAMUSCULAR | Status: AC
  Filled 2019-06-24: qty 2

## 2019-06-24 MED ORDER — OXYCODONE HCL 5 MG PO TABS: 5.0000 mg | ORAL_TABLET | Freq: Once | ORAL | Status: DC | PRN

## 2019-06-24 MED ORDER — LIDOCAINE 2% (20 MG/ML) 5 ML SYRINGE
INTRAMUSCULAR | Status: AC
  Filled 2019-06-24: qty 5

## 2019-06-24 MED ORDER — ACETAMINOPHEN 500 MG PO TABS
ORAL_TABLET | ORAL | Status: AC
  Filled 2019-06-24: qty 2

## 2019-06-24 MED ORDER — MEPERIDINE HCL 25 MG/ML IJ SOLN: 6.2500 mg | INTRAMUSCULAR | Status: DC | PRN

## 2019-06-24 MED ORDER — FENTANYL CITRATE (PF) 100 MCG/2ML IJ SOLN: 50.0000 ug | INTRAMUSCULAR | Status: DC | PRN

## 2019-06-24 MED ORDER — VANCOMYCIN HCL IN DEXTROSE 1-5 GM/200ML-% IV SOLN
INTRAVENOUS | Status: AC
  Filled 2019-06-24: qty 200

## 2019-06-24 MED ORDER — HYDROMORPHONE HCL 1 MG/ML IJ SOLN
INTRAMUSCULAR | Status: AC
  Filled 2019-06-24: qty 0.5

## 2019-06-24 MED ORDER — ONDANSETRON HCL 4 MG/2ML IJ SOLN
INTRAMUSCULAR | Status: DC | PRN
  Administered 2019-06-24: 4 mg via INTRAVENOUS

## 2019-06-24 MED ORDER — FENTANYL CITRATE (PF) 100 MCG/2ML IJ SOLN
INTRAMUSCULAR | Status: DC | PRN
  Administered 2019-06-24 (×4): 25 ug via INTRAVENOUS

## 2019-06-24 MED ORDER — HYDROMORPHONE HCL 1 MG/ML IJ SOLN
0.2500 mg | INTRAMUSCULAR | Status: DC | PRN
  Administered 2019-06-24 (×2): 0.5 mg via INTRAVENOUS

## 2019-06-24 MED ORDER — IBUPROFEN 800 MG PO TABS: 800.0000 mg | ORAL_TABLET | Freq: Three times a day (TID) | ORAL | 0 refills | Status: DC | PRN

## 2019-06-24 MED ORDER — DEXAMETHASONE SODIUM PHOSPHATE 10 MG/ML IJ SOLN
INTRAMUSCULAR | Status: DC | PRN
  Administered 2019-06-24: 4 mg via INTRAVENOUS

## 2019-06-24 MED ORDER — LIDOCAINE 2% (20 MG/ML) 5 ML SYRINGE
INTRAMUSCULAR | Status: DC | PRN
  Administered 2019-06-24: 80 mg via INTRAVENOUS

## 2019-06-24 MED ORDER — DEXAMETHASONE SODIUM PHOSPHATE 10 MG/ML IJ SOLN
INTRAMUSCULAR | Status: AC
  Filled 2019-06-24: qty 1

## 2019-06-24 MED ORDER — MIDAZOLAM HCL 2 MG/2ML IJ SOLN
1.0000 mg | INTRAMUSCULAR | Status: DC | PRN
  Administered 2019-06-24: 11:00:00 2 mg via INTRAVENOUS

## 2019-06-24 MED ORDER — GABAPENTIN 300 MG PO CAPS
300.0000 mg | ORAL_CAPSULE | ORAL | Status: AC
  Administered 2019-06-24: 300 mg via ORAL

## 2019-06-24 MED ORDER — ONDANSETRON HCL 4 MG/2ML IJ SOLN
INTRAMUSCULAR | Status: AC
  Filled 2019-06-24: qty 2

## 2019-06-24 MED ORDER — KETOROLAC TROMETHAMINE 30 MG/ML IJ SOLN
30.0000 mg | Freq: Once | INTRAMUSCULAR | Status: AC | PRN
  Administered 2019-06-24: 30 mg via INTRAVENOUS

## 2019-06-24 MED ORDER — VANCOMYCIN HCL 1000 MG IV SOLR
INTRAVENOUS | Status: DC | PRN
  Administered 2019-06-24: 1000 mg via INTRAVENOUS

## 2019-06-24 MED ORDER — SODIUM CHLORIDE 0.9 % IV SOLN
INTRAVENOUS | Status: DC | PRN
  Administered 2019-06-24: 12:00:00 500 mL

## 2019-06-24 MED ORDER — CELECOXIB 200 MG PO CAPS
200.0000 mg | ORAL_CAPSULE | ORAL | Status: AC
  Administered 2019-06-24: 10:00:00 200 mg via ORAL

## 2019-06-24 MED ORDER — SODIUM BICARBONATE 4 % IV SOLN
INTRAVENOUS | Status: DC | PRN
  Administered 2019-06-24: 100 mL via INTRAMUSCULAR

## 2019-06-24 MED ORDER — SCOPOLAMINE 1 MG/3DAYS TD PT72
MEDICATED_PATCH | TRANSDERMAL | Status: AC
  Filled 2019-06-24: qty 1

## 2019-06-24 MED ORDER — VANCOMYCIN HCL IN DEXTROSE 1-5 GM/200ML-% IV SOLN: 1000.0000 mg | INTRAVENOUS | Status: DC

## 2019-06-24 MED ORDER — PROPOFOL 10 MG/ML IV BOLUS
INTRAVENOUS | Status: DC | PRN
  Administered 2019-06-24: 200 mg via INTRAVENOUS

## 2019-06-24 MED ORDER — KETOROLAC TROMETHAMINE 30 MG/ML IJ SOLN
INTRAMUSCULAR | Status: AC
  Filled 2019-06-24: qty 1

## 2019-06-24 MED ORDER — LACTATED RINGERS IV SOLN: INTRAVENOUS | Status: DC

## 2019-06-24 MED ORDER — PROMETHAZINE HCL 25 MG/ML IJ SOLN: 6.2500 mg | INTRAMUSCULAR | Status: DC | PRN

## 2019-06-24 MED ORDER — CELECOXIB 200 MG PO CAPS
ORAL_CAPSULE | ORAL | Status: AC
  Filled 2019-06-24: qty 1

## 2019-06-24 MED ORDER — ACETAMINOPHEN 500 MG PO TABS
1000.0000 mg | ORAL_TABLET | ORAL | Status: AC
  Administered 2019-06-24: 10:00:00 1000 mg via ORAL

## 2019-06-24 MED ORDER — EPHEDRINE 5 MG/ML INJ
INTRAVENOUS | Status: AC
  Filled 2019-06-24: qty 10

## 2019-06-24 MED ORDER — OXYCODONE HCL 5 MG/5ML PO SOLN: 5.0000 mg | Freq: Once | ORAL | Status: DC | PRN

## 2019-06-24 MED FILL — IBUPROFEN 800 MG TAB: 800 | 10 days supply | Qty: 30 | Fill #0

## 2019-06-24 MED FILL — SULFAMETHOXAZOLE-TMP DS TAB: 800-160 | 7 days supply | Qty: 14 | Fill #0

## 2019-06-24 MED FILL — METHOCARBAMOL 500 MG TABS: 500 | 10 days supply | Qty: 30 | Fill #0

## 2019-06-24 SURGICAL SUPPLY — 92 items
ADH SKN CLS APL DERMABOND .7 (GAUZE/BANDAGES/DRESSINGS) ×2
APL PRP STRL LF DISP 70% ISPRP (MISCELLANEOUS) ×1
BAG DECANTER FOR FLEXI CONT (MISCELLANEOUS) ×3 IMPLANT
BINDER ABDOMINAL 10 UNV 27-48 (MISCELLANEOUS) IMPLANT
BINDER ABDOMINAL 12 SM 30-45 (SOFTGOODS) IMPLANT
BINDER BREAST LRG (GAUZE/BANDAGES/DRESSINGS) IMPLANT
BINDER BREAST MEDIUM (GAUZE/BANDAGES/DRESSINGS) IMPLANT
BINDER BREAST XLRG (GAUZE/BANDAGES/DRESSINGS) IMPLANT
BINDER BREAST XXLRG (GAUZE/BANDAGES/DRESSINGS) ×2 IMPLANT
BLADE HEX COATED 2.75 (ELECTRODE) IMPLANT
BLADE SURG 10 STRL SS (BLADE) ×3 IMPLANT
BLADE SURG 11 STRL SS (BLADE) ×3 IMPLANT
BLADE SURG 15 STRL LF DISP TIS (BLADE) IMPLANT
BLADE SURG 15 STRL SS (BLADE)
BNDG GAUZE ELAST 4 BULKY (GAUZE/BANDAGES/DRESSINGS) ×6 IMPLANT
CANISTER SUCT 1200ML W/VALVE (MISCELLANEOUS) ×3 IMPLANT
CHLORAPREP W/TINT 26 (MISCELLANEOUS) ×3 IMPLANT
COVER BACK TABLE 60X90IN (DRAPES) ×3 IMPLANT
COVER MAYO STAND STRL (DRAPES) ×3 IMPLANT
COVER WAND RF STERILE (DRAPES) IMPLANT
DECANTER SPIKE VIAL GLASS SM (MISCELLANEOUS) IMPLANT
DERMABOND ADVANCED (GAUZE/BANDAGES/DRESSINGS) ×4
DERMABOND ADVANCED .7 DNX12 (GAUZE/BANDAGES/DRESSINGS) ×2 IMPLANT
DRAIN CHANNEL 15F RND FF W/TCR (WOUND CARE) IMPLANT
DRAIN CHANNEL 19F RND (DRAIN) ×2 IMPLANT
DRAPE HALF SHEET 70X43 (DRAPES) ×3 IMPLANT
DRAPE INCISE IOBAN 66X45 STRL (DRAPES) IMPLANT
DRAPE LAPAROSCOPIC ABDOMINAL (DRAPES) IMPLANT
DRAPE TOP ARMCOVERS (MISCELLANEOUS) ×3 IMPLANT
DRAPE U-SHAPE 76X120 STRL (DRAPES) ×3 IMPLANT
DRAPE UTILITY XL STRL (DRAPES) ×3 IMPLANT
DRSG PAD ABDOMINAL 8X10 ST (GAUZE/BANDAGES/DRESSINGS) ×6 IMPLANT
ELECT BLADE 4.0 EZ CLEAN MEGAD (MISCELLANEOUS)
ELECT COATED BLADE 2.86 ST (ELECTRODE) ×3 IMPLANT
ELECT REM PT RETURN 9FT ADLT (ELECTROSURGICAL) ×3
ELECTRODE BLDE 4.0 EZ CLN MEGD (MISCELLANEOUS) IMPLANT
ELECTRODE REM PT RTRN 9FT ADLT (ELECTROSURGICAL) ×1 IMPLANT
EVACUATOR SILICONE 100CC (DRAIN) ×2 IMPLANT
GLOVE BIO SURGEON STRL SZ 6 (GLOVE) ×6 IMPLANT
GLOVE BIO SURGEON STRL SZ 6.5 (GLOVE) IMPLANT
GLOVE BIO SURGEONS STRL SZ 6.5 (GLOVE)
GLOVE BIOGEL PI IND STRL 6.5 (GLOVE) IMPLANT
GLOVE BIOGEL PI INDICATOR 6.5 (GLOVE)
GOWN STRL REUS W/ TWL LRG LVL3 (GOWN DISPOSABLE) ×2 IMPLANT
GOWN STRL REUS W/TWL LRG LVL3 (GOWN DISPOSABLE) ×6
IMPL BREAST INSPI SRX 545CC (Breast) IMPLANT
IMPLANT BREAST INSPI SRX 545CC (Breast) ×3 IMPLANT
IV NS 500ML (IV SOLUTION)
IV NS 500ML BAXH (IV SOLUTION) IMPLANT
KIT FILL SYSTEM UNIVERSAL (SET/KITS/TRAYS/PACK) IMPLANT
LINER CANISTER 1000CC FLEX (MISCELLANEOUS) ×3 IMPLANT
MARKER SKIN DUAL TIP RULER LAB (MISCELLANEOUS) IMPLANT
NDL FILTER BLUNT 18X1 1/2 (NEEDLE) IMPLANT
NDL HYPO 25X1 1.5 SAFETY (NEEDLE) IMPLANT
NDL SAFETY ECLIPSE 18X1.5 (NEEDLE) ×1 IMPLANT
NDL SPNL 18GX3.5 QUINCKE PK (NEEDLE) IMPLANT
NEEDLE FILTER BLUNT 18X 1/2SAF (NEEDLE)
NEEDLE FILTER BLUNT 18X1 1/2 (NEEDLE) IMPLANT
NEEDLE HYPO 18GX1.5 SHARP (NEEDLE) ×3
NEEDLE HYPO 25X1 1.5 SAFETY (NEEDLE) IMPLANT
NEEDLE SPNL 18GX3.5 QUINCKE PK (NEEDLE) IMPLANT
NS IRRIG 1000ML POUR BTL (IV SOLUTION) IMPLANT
PACK BASIN DAY SURGERY FS (CUSTOM PROCEDURE TRAY) ×3 IMPLANT
PAD ALCOHOL SWAB (MISCELLANEOUS) ×6 IMPLANT
PENCIL SMOKE EVACUATOR (MISCELLANEOUS) ×3 IMPLANT
PIN SAFETY STERILE (MISCELLANEOUS) ×3 IMPLANT
SLEEVE SCD COMPRESS KNEE MED (MISCELLANEOUS) ×3 IMPLANT
SPONGE LAP 18X18 RF (DISPOSABLE) ×6 IMPLANT
STAPLER VISISTAT 35W (STAPLE) ×3 IMPLANT
SUT ETHILON 2 0 FS 18 (SUTURE) IMPLANT
SUT MNCRL AB 4-0 PS2 18 (SUTURE) IMPLANT
SUT PDS 3-0 CT2 (SUTURE)
SUT PDS AB 2-0 CT2 27 (SUTURE) IMPLANT
SUT PDS II 3-0 CT2 27 ABS (SUTURE) IMPLANT
SUT VIC AB 3-0 PS1 18 (SUTURE)
SUT VIC AB 3-0 PS1 18XBRD (SUTURE) IMPLANT
SUT VIC AB 3-0 SH 27 (SUTURE) ×3
SUT VIC AB 3-0 SH 27X BRD (SUTURE) ×1 IMPLANT
SUT VICRYL 4-0 PS2 18IN ABS (SUTURE) IMPLANT
SYR 20ML LL LF (SYRINGE) IMPLANT
SYR 50ML LL SCALE MARK (SYRINGE) ×3 IMPLANT
SYR BULB IRRIGATION 50ML (SYRINGE) ×6 IMPLANT
SYR CONTROL 10ML LL (SYRINGE) IMPLANT
SYR TB 1ML LL NO SAFETY (SYRINGE) ×3 IMPLANT
TISSUE MATRIX STATTICE 8X16 (Tissue) ×2 IMPLANT
TOWEL GREEN STERILE FF (TOWEL DISPOSABLE) ×6 IMPLANT
TUBE CONNECTING 20'X1/4 (TUBING) ×1
TUBE CONNECTING 20X1/4 (TUBING) ×2 IMPLANT
TUBING INFILTRATION IT-10001 (TUBING) ×3 IMPLANT
TUBING SET GRADUATE ASPIR 12FT (MISCELLANEOUS) ×3 IMPLANT
UNDERPAD 30X36 HEAVY ABSORB (UNDERPADS AND DIAPERS) ×6 IMPLANT
YANKAUER SUCT BULB TIP NO VENT (SUCTIONS) ×3 IMPLANT

## 2019-06-24 NOTE — Transfer of Care (Signed)
Immediate Anesthesia Transfer of Care Note  Patient: Kathryn Stanley  Procedure(s) Performed: REVISION RIGHT BREAST RECONSTRUCTION WITH SILICONE IMPLANT EXCAHNGE, STRATTICE TO RIGHT CHEST (Right Breast) POSSIBLE LIPOSUCTION RIGHT CHEST (Right Breast)  Patient Location: PACU  Anesthesia Type:General  Level of Consciousness: awake  Airway & Oxygen Therapy: Patient Spontanous Breathing  Post-op Assessment: Report given to RN and Post -op Vital signs reviewed and stable  Post vital signs: Reviewed and stable  Last Vitals:  Vitals Value Taken Time  BP 140/93 06/24/19 1301  Temp    Pulse 96 06/24/19 1303  Resp 14 06/24/19 1303  SpO2 98 % 06/24/19 1303  Vitals shown include unvalidated device data.  Last Pain:  Vitals:   06/24/19 1015  TempSrc: Temporal  PainSc: 0-No pain         Complications: No apparent anesthesia complications

## 2019-06-24 NOTE — Op Note (Signed)
Operative Note   DATE OF OPERATION: 1.15.21  LOCATION: [facility]-outpatient  SURGICAL DIVISION: Plastic Surgery  PREOPERATIVE DIAGNOSES:  1. History breast cancer 2. Acquired absence breasts 3. History therapeutic radiation 4. BRCA1  POSTOPERATIVE DIAGNOSES:  same  PROCEDURE:  1. Revision right breast reconstruction with silicone implant exchange, liposuction right lateral chest 2. Strattice BPS matrix to right chest 15.9 x 7.9 cm  SURGEON: Irene Limbo MD MBA  ASSISTANT: none  ANESTHESIA:  General.   EBL: 25 ml  COMPLICATIONS: None immediate.   INDICATIONS FOR PROCEDURE:  The patient, Kathryn Stanley, is a 43 y.o. female born on 04/04/1976, is here for revision right prepectoral breast reconstruction for inferior and lateral displacement implant.   FINDINGS: Incorporation of previously placed human acellular dermis. Removed intact smooth silicone implant. Placed new Natrelle Smooth Round Inspira Extra Projection 545 ml implant REF [id number]. Total lipoaspirate right lateral chest wall 150 ml.  DESCRIPTION OF PROCEDURE:  The patient's operative site was marked with the patient in the preoperative area. The patient was taken to the operating room. SCDs were placed and IV antibiotics were given. The patient's operative site was prepped and draped in a sterile fashion. A time out was performed and all information was confirmed to be correct. Incision made in prior inframammary fold scar and carried through superficial fascia and capsule. Intact implant removed. Stab incision made lateral extent prior inframammary scar and tumescent infiltrated 150 ml. Power assisted liposuction performed.   Strattice matrix wasperforated and inset to right chest wall with 2-0 PDS medial to desired anterior axillary line. The ADM was then sutured to anterior capsule with 2-0 PDS. Additional plication suture placed with interrupted 2-0 PDS to chest wall and anterior mastectomy flap  capsule. Cavity irrigated with polymyxin bacitracin solution. Hemostasis ensured. 58 Fr JP placed and secured with 2-0 nylon. Cavity irrigated with Betadine saline solution. Implant placed inrightbreast cavity. Care taken to ensure proper orientation. Remainder Strattice sutured to anterior capsule and inframammary fold with 2-0 PDS suture. Superficial fasciaclosed with running 3-0 vicryl followed by 4-0 vicryl in dermis, 4-0 monocryl subcuticular for skin closure.  Fullness medial extent inframammary fold scar noted and elliptical de epithelization skin in this area completed. Closure completed with 4-0 vicryl in dermis and 4-0 monocryl subcuticular skin closure.  Tissue adhesive applied followed dry dressing and breast binder.  The patient was allowed to wake from anesthesia, extubated and taken to the recovery room in satisfactory condition.   SPECIMENS: none  DRAINS: 19 Fr JP in right breast reconstruction

## 2019-06-24 NOTE — H&P (Signed)
Subjective:     Patient ID: Viva Gallaher is a 44 y.o. female.  HPI  1 year post op implant exchange and LD flap. Notes right breast "Danny Devito and left [employer], not sisters not twins."  Presented with palpable mass. MMG with mass 2.3 x 1.6 x 3.0 cm in the 130 o'clock left breast 8cmfn with enlarged axillary LN . Indeterminate group of calcifications in the upper central portion of the left breast. Biopsy of 130 mass IDC, ER+ weak, PR-, Her2 -. LN positive for metastatic disease. MRI demonstrated 2.5 x 1.3 x 1.8 cm mass UOQ. Enlarged level I and level II left axillary adenopathy noted, including level II LN behind the pectoralis muscle.  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  Staging studies negative for distant disease.  Completed neoadjuvant chemotherapy. Completed adjuvant radiation 5.10.19. On Xeloda.   Final MRI with decrease size mass to 1.2 x 1.0 x 1.6 cm, previously 2.5 x 1.3 x 1.8 cm. The previously noted enlarged LN decreased in size.   Final pathology right benign, left with 1.3 cm IDC, margins clear, 0/2 SLN.  Prior 42C, happy wit this. Right mastectomy 910 g Left 743 g  Works at [employer].  Quit smoking 03/2017, off nicotine patch  Review of Systems     Objective:   Physical Exam CV: normal heart sounds PULM: clear to auscultation    Chest: bilateral implants in place, no masses Axillae benign Right Baker 1 left Baker 2 right with implant lower on chest vs left, fuller lateral chest wall soft tissue vs left   Assessment:   Left breast ca UOQ ER+ BRCA1 Neoadjuvant chemotherapy S/p bilateral SRM, TE/ADM (Alloderm) prepectoral reconstruction  Adjuvant radiation S/p silicone implant exchange, Right ADM (Alloderm), left LD flap, excision right chest nevus    Plan:   Stable exam, changes as noted at prior visit namely, volume symmetric, has had stretching of  ADM on right and implant has descended, asymmetric position on chest. Reviewed intraop at time of implant exchange, her initial placed ADM had essentially resorbed. As we discussed last visit, she has had ADM twice, if we were to do revision consider Strattice so less stretching out. Patient understands porcine source of this and desires to proceed with revision with this (does not want to try human cadaver matrix again given above). She also asked if she should have LD flap on this side- reviewed this is option especially if she does not want more attempts at acellular matrix regardless source. Latter however would have hospital stay at least overnight and more drains, longer recovery. Reviewed current COVID restrictions on hospital stay/bed capacity and this changes daily.  Plan for now revision right reconstruction with silicone implant exchange Strattice. Reviewed OP surgery drains, will keep same implant volume/projection. Possible liposuction right chest latera chest wall.  Natrellle Inspira Smooth Round Extra Projection implants placed bilateral. RIGHT 545 ml REF [id number] LEFT 525 ml REF [id number].

## 2019-06-24 NOTE — Anesthesia Procedure Notes (Signed)
Procedure Name: LMA Insertion Performed by: Milford Cage, CRNA Pre-anesthesia Checklist: Patient identified, Emergency Drugs available, Suction available and Patient being monitored Patient Re-evaluated:Patient Re-evaluated prior to induction Oxygen Delivery Method: Circle System Utilized Preoxygenation: Pre-oxygenation with 100% oxygen Induction Type: IV induction Ventilation: Mask ventilation without difficulty LMA: LMA inserted LMA Size: 4.0 Number of attempts: 1 Airway Equipment and Method: Bite block Placement Confirmation: positive ETCO2 Tube secured with: Tape Dental Injury: Teeth and Oropharynx as per pre-operative assessment

## 2019-06-24 NOTE — Anesthesia Preprocedure Evaluation (Signed)
Anesthesia Evaluation  Patient identified by MRN, date of birth, ID band Patient awake    Reviewed: Allergy & Precautions, NPO status , Patient's Chart, lab work & pertinent test results  History of Anesthesia Complications (+) PONV and history of anesthetic complications  Airway Mallampati: II  TM Distance: >3 FB Neck ROM: Full    Dental  (+) Chipped,    Pulmonary former smoker,  Quit smoking [id number], 17 pack year history   Pulmonary exam normal breath sounds clear to auscultation       Cardiovascular negative cardio ROS Normal cardiovascular exam Rhythm:Regular Rate:Normal     Neuro/Psych PSYCHIATRIC DISORDERS Depression negative neurological ROS     GI/Hepatic Neg liver ROS, GERD  Controlled,  Endo/Other  negative endocrine ROS  Renal/GU negative Renal ROS  negative genitourinary   Musculoskeletal negative musculoskeletal ROS (+)   Abdominal (+) + obese,   Peds  Hematology negative hematology ROS (+)   Anesthesia Other Findings history left breast cancer, acquired absence breasts, history therapeutic radiation s/p multiple surgeries  Reproductive/Obstetrics negative OB ROS                             Anesthesia Physical  Anesthesia Plan  ASA: II  Anesthesia Plan: General   Post-op Pain Management:    Induction: Intravenous  PONV Risk Score and Plan: 4 or greater and Scopolamine patch - Pre-op, Midazolam, Dexamethasone, Ondansetron and Treatment may vary due to age or medical condition  Airway Management Planned: LMA  Additional Equipment: None  Intra-op Plan:   Post-operative Plan: Extubation in OR  Informed Consent: I have reviewed the patients History and Physical, chart, labs and discussed the procedure including the risks, benefits and alternatives for the proposed anesthesia with the patient or authorized representative who has indicated his/her understanding and  acceptance.     Dental advisory given  Plan Discussed with: CRNA  Anesthesia Plan Comments:         Anesthesia Quick Evaluation

## 2019-06-24 NOTE — Anesthesia Postprocedure Evaluation (Signed)
Anesthesia Post Note  Patient: Darling Cieslewicz Lipkin  Procedure(s) Performed: REVISION RIGHT BREAST RECONSTRUCTION WITH SILICONE IMPLANT EXCAHNGE, STRATTICE TO RIGHT CHEST (Right Breast) POSSIBLE LIPOSUCTION RIGHT CHEST (Right Breast)     Patient location during evaluation: PACU Anesthesia Type: General Level of consciousness: awake and alert, oriented and patient cooperative Pain management: pain level controlled Vital Signs Assessment: post-procedure vital signs reviewed and stable Respiratory status: spontaneous breathing, nonlabored ventilation and respiratory function stable Cardiovascular status: blood pressure returned to baseline and stable Postop Assessment: no apparent nausea or vomiting Anesthetic complications: no    Last Vitals:  Vitals:   06/24/19 1301 06/24/19 1315  BP: (!) 140/93 120/68  Pulse: 98 89  Resp: (!) 9 11  Temp: 37.1 C   SpO2: 97% 100%    Last Pain:  Vitals:   06/24/19 1315  TempSrc:   PainSc: Halfway

## 2019-06-24 NOTE — Discharge Instructions (Signed)

## 2019-06-27 ENCOUNTER — Encounter: Payer: Self-pay | Admitting: *Deleted

## 2019-06-30 ENCOUNTER — Encounter: Payer: Self-pay | Admitting: *Deleted

## 2019-07-15 MED FILL — VENLAFAXINE HCL ER 75 MG CA: 75 | 30 days supply | Qty: 30 | Fill #2

## 2019-08-08 ENCOUNTER — Inpatient Hospital Stay: Payer: Medicaid Other

## 2019-08-08 ENCOUNTER — Inpatient Hospital Stay: Payer: Medicaid Other | Attending: Hematology | Admitting: Hematology

## 2019-08-08 DIAGNOSIS — Z923 Personal history of irradiation: Secondary | ICD-10-CM | POA: Insufficient documentation

## 2019-08-08 DIAGNOSIS — Z9221 Personal history of antineoplastic chemotherapy: Secondary | ICD-10-CM | POA: Insufficient documentation

## 2019-08-08 DIAGNOSIS — Z79899 Other long term (current) drug therapy: Secondary | ICD-10-CM | POA: Insufficient documentation

## 2019-08-08 DIAGNOSIS — F17210 Nicotine dependence, cigarettes, uncomplicated: Secondary | ICD-10-CM | POA: Insufficient documentation

## 2019-08-08 DIAGNOSIS — K219 Gastro-esophageal reflux disease without esophagitis: Secondary | ICD-10-CM | POA: Insufficient documentation

## 2019-08-08 DIAGNOSIS — Z90722 Acquired absence of ovaries, bilateral: Secondary | ICD-10-CM | POA: Insufficient documentation

## 2019-08-08 DIAGNOSIS — F329 Major depressive disorder, single episode, unspecified: Secondary | ICD-10-CM | POA: Insufficient documentation

## 2019-08-08 DIAGNOSIS — C50412 Malignant neoplasm of upper-outer quadrant of left female breast: Secondary | ICD-10-CM | POA: Insufficient documentation

## 2019-08-08 DIAGNOSIS — Z90710 Acquired absence of both cervix and uterus: Secondary | ICD-10-CM | POA: Insufficient documentation

## 2019-08-08 DIAGNOSIS — R232 Flushing: Secondary | ICD-10-CM | POA: Insufficient documentation

## 2019-08-08 DIAGNOSIS — Z170 Estrogen receptor positive status [ER+]: Secondary | ICD-10-CM | POA: Insufficient documentation

## 2019-08-08 DIAGNOSIS — Z9013 Acquired absence of bilateral breasts and nipples: Secondary | ICD-10-CM | POA: Insufficient documentation

## 2019-08-08 DIAGNOSIS — D696 Thrombocytopenia, unspecified: Secondary | ICD-10-CM | POA: Insufficient documentation

## 2019-08-08 DIAGNOSIS — Z1501 Genetic susceptibility to malignant neoplasm of breast: Secondary | ICD-10-CM | POA: Insufficient documentation

## 2019-08-10 ENCOUNTER — Telehealth: Payer: Self-pay | Admitting: Hematology

## 2019-08-10 NOTE — Telephone Encounter (Signed)
Called per 3/3 sch message -unable to reach pt . Left message for patient to call back to set up an appt.

## 2019-08-10 NOTE — Progress Notes (Signed)
[place]   Telephone:([phone] Fax:(336) [phone]   Clinic Follow up Note   Patient Care Team: Mosetta Anis, MD as PCP - Joylene Draft, MD as Consulting Physician (General Surgery) [name], MD as Consulting Physician (Hematology) [name], MD as Consulting Physician (Radiation Oncology)  Date of Service:  08/11/2019  CHIEF COMPLAINT: Follow-upleft breast cancer  SUMMARY OF ONCOLOGIC HISTORY: Oncology History Overview Note  Cancer Staging Malignant neoplasm of upper-outer quadrant of left breast in female, estrogen receptor positive ([place]) Staging form: Breast, AJCC 8th Edition - Clinical stage from 12/29/2016: Stage IIIA (cT2, cN1, cM0, G3, ER: Positive, PR: Negative, HER2: Negative) - Signed by [name], MD on 01/07/2017 - Pathologic: No Stage Recommended (ypT1c, pN0, cM0, G2, ER-, PR-, HER2-) - Unsigned Cancer Staging Malignant neoplasm of upper-outer quadrant of left breast in female, estrogen receptor positive ([place]) Staging form: Breast, AJCC 8th Edition - Clinical stage from 12/29/2016: Stage IIIA (cT2, cN1, cM0, G3, ER: Positive, PR: Negative, HER2: Negative) - Signed by [name], MD on 01/07/2017 - Pathologic: No Stage Recommended (ypT1c, pN0, cM0, G2, ER-, PR-, HER2-) - Unsigned     Malignant neoplasm of upper-outer quadrant of left breast in female, estrogen receptor positive ([place])  12/25/2016 Mammogram   [place] and [facility] Diagnostic Breast Tomo Bilateral 12/25/16 IMPRESSION: 1. Suspicious mass 2.3 x 1.6 x 3.0 cm  in the 130 o'clock location of the left breast 8cm from the nipple, biopsy recommended. Adjacent simple cyst is 1.9 cm. 2. Suspicious left axillary lymph node 2.2cm for which biopsy is indicated. 3. Indeterminate group of calcifications in the upper central portion of the left breast for which biopsy is recommended.    12/29/2016 Initial Biopsy   Diagnosis 12/29/16 1. Breast, left, needle core biopsy, stereotactic, upper inner  quadrant - FIBROCYSTIC CHANGES INCLUDING APOCRINE METAPLASIA WITH CALCIFICATIONS - NO MALIGNANCY IDENTIFIED 2. Breast, left, needle core biopsy, ultrasound, 1:30 o'clock - INVASIVE MAMMARY CARCINOMA, G3 - SEE COMMENT 3. Lymph node, needle/core biopsy, ultrasound, left axillary - METASTIC CARCINOMA INVOLVING ONE LYMPH NODE (1/1) E-cadherin is positive supporting a ductal origin.    12/29/2016 Receptors her2   ER 15%+, weak staining  PR - HER2- Ki67 10%    12/29/2016 Initial Diagnosis   Malignant neoplasm of upper-outer quadrant of left breast in female, estrogen receptor positive ([place])   01/13/2017 Echocardiogram   ECHO 01/13/17 Study Conclusions - Left ventricle: The cavity size was normal. Systolic function was   normal. The estimated ejection fraction was in the range of 60%   to 65%. Wall motion was normal; there were no regional wall   motion abnormalities. Left ventricular diastolic function   parameters were normal. - Mitral valve: There was trivial regurgitation. - Right atrium: The atrium was mildly dilated. - Tricuspid valve: There was trivial regurgitation.    01/14/2017 Surgery   Port placement by Dr. Donne Hazel    01/15/2017 Imaging   Whole Body bone Scan 01/15/17 IMPRESSION: 1.  No significant abnormality identified.   01/15/2017 Imaging   CT CAP W Contrast 01/15/17 IMPRESSION: 1. Left lateral breast mass with pathologic left axillary adenopathy including a 2.0 cm left axillary lymph node. 2. Several hypodense lesions in the liver are likely benign lesions such as cysts. However, these are technically too small to characterize by CT. Possibilities for further workup include surveillance or hepatic protocol MRI with and without contrast. 3. There is also a tiny hypodense lesion in the spleen which is technically nonspecific but  statistically highly likely to be benign. 4.  Prominent stool throughout the colon favors constipation   01/16/2017 - 06/05/2017  Chemotherapy   Neoadjuvant chemo AC every 2 weeks for 4 cycles starting on 01/16/2017 and ended 02/27/17. Then proceed with weekly taxol for 12 treatments starting 03/13/17. Added Carboplatin AUC 2 on 04/03/17 to weekly taxol. Due to symptomatic thrombocytopenia we held Carbo since cycle 8. Cycle 9 was post-poned due to neutropenia and Granix was added starting 05/12/17. Added carbo back at reduced dose AUC 1.5 with cycle 10 on 05/22/17. 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   Analysis also detected a Variant of Uncertain Significance (VUS) in the NBN gene called c.628G>T (p.Val210Phe)  Update: The Variant of Uncertain Significance NBN c.628G>T (p.Val210Phe) has been reclassified to "Likely Benign." Report date is 04/09/2018.    06/12/2017 Imaging   MR BREAST BILATERAL WO CONTRAST IMPRESSION: 1. Significant positive response to neoadjuvant chemotherapy. The index carcinoma in the left breast has significantly decreased in size. Left axillary adenopathy has also significantly improved. 2. No other evidence of malignancy.   07/16/2017 Surgery   Pt underwent a bilateral mastectomy and left SLN biopsy with Dr. Donne Hazel   07/16/2017 Pathology Results   Diagnosis 07/16/17 1. Breast, simple mastectomy, Right Prophylactic - FIBROCYSTIC CHANGES INCLUDING APOCRINE METAPLASIA - DUCT ECTASIA - CALCIFICATIONS - NO MALIGNANCY IDENTIFIED 2. Lymph node, sentinel, biopsy, Left Axillary - NO CARCINOMA IDENTIFIED IN ONE LYMPH NODE (0/1) - SEE COMMENT 3. Lymph node, sentinel, biopsy, Left - NO CARCINOMA IDENTIFIED IN ONE LYMPH NODE (0/1) - SEE COMMENT 4. Breast, simple mastectomy, Left - INVASIVE DUCTAL CARCINOMA, NOTTINGHAM GRADE 2 OF 3, 1.3 CM - MARGINS UNINVOLVED BY CARCINOMA (2 CM POSTERIOR MARGIN) - PREVIOUS BIOPSY SITE CHANGES - CALCIFICATIONS - SEE ONCOLOGY TABLE  BELOW   07/16/2017 Receptors her2   Results: IMMUNOHISTOCHEMICAL AND MORPHOMETRIC ANALYSIS PERFORMED MANUALLY Estrogen Receptor: 0%, NEGATIVE Progesterone Receptor: 0%, NEGATIVE Results: HER2 - NEGATIVE RATIO OF HER2/CEP17 SIGNALS 1.73 AVERAGE HER2 COPY NUMBER PER CELL 2.67   09/02/2017 - 10/16/2017 Radiation Therapy   Adjuvant radiation per Dr. Lisbeth Lheureux    11/03/2017 Surgery   XI ROBOTIC ASSISTED TOTAL HYSTERECTOMY WITH BILATERAL SALPINGO OOPHORECTOMY  by Dr. Denman George and Dr. Delsa Sale 11/03/17   11/23/2017 - 02/08/2018 Chemotherapy   Adjuvant Xeloda 2049m BID 2 weeks on and 1 week off   06/25/2018 Surgery   Bilateral breast reconstruction with Dr. TIran Planas 06/25/18       CURRENT THERAPY:  Surveillance  INTERVAL HISTORY:  JLONISHA BOBBYis here for a follow up of left breast cancer. She was last seen by me 6 months ago. She notes she is doing well. She notes she has hot flashes and significant mood swings with depressed moods. She is on 767mEffexor. She notes she has gained 5 pounds in 6 months. She has continued to work on diet, but feels she could exercise more. She denies joint pain.     REVIEW OF SYSTEMS:   Constitutional: Denies fevers, chills or abnormal weight loss (+) hot flashes  Eyes: Denies blurriness of vision Ears, nose, mouth, throat, and face: Denies mucositis or sore throat Respiratory: Denies cough, dyspnea or wheezes Cardiovascular: Denies palpitation, chest discomfort or lower extremity swelling Gastrointestinal:  Denies nausea, heartburn or change in bowel habits Skin: Denies abnormal skin rashes Lymphatics: Denies new lymphadenopathy or easy bruising Neurological:Denies  numbness, tingling or new weaknesses Behavioral/Psych: (+) Mood swings with depressed moods All other systems were reviewed with the patient and are negative.  MEDICAL HISTORY:  Past Medical History:  Diagnosis Date  . Breast cancer, left breast ([place]) dx'd 01/2017  . Complication of  anesthesia   . Dermatologic problem    possible psoriasis. Pt has not had been diagnosed by dermatology.  . 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 GERD (gastroesophageal reflux disease)    occational  . PONV (postoperative nausea and vomiting)   . Tobacco dependence    trying to quit with nicotene patches  . UTI (urinary tract infection) 04/24/2017  . Viral upper respiratory infection 03/11/2018    SURGICAL HISTORY: Past Surgical History:  Procedure Laterality Date  . APPLICATION OF A-CELL OF CHEST/ABDOMEN Right 06/25/2018   Procedure: ACELLULAR DERMIS TO RIGHT CHEST;  Surgeon: Irene Limbo, MD;  Location: [facility];  Service: Plastics;  Laterality: Right;  . BREAST BIOPSY Left 12/2016  . BREAST IMPLANT EXCHANGE Right 06/24/2019   Procedure: REVISION RIGHT BREAST RECONSTRUCTION WITH SILICONE IMPLANT EXCAHNGE, STRATTICE TO RIGHT CHEST;  Surgeon: Irene Limbo, MD;  Location: [facility];  Service: Plastics;  Laterality: Right;  . BREAST RECONSTRUCTION Bilateral 07/16/2017   BILATERAL BREAST RECONSTRUCTION WITH PLACEMENT OF TISSUE EXPANDER AND ALLODERM/notes 07/16/2017  . BREAST RECONSTRUCTION  06/25/2018  . CESAREAN SECTION  2002  . LATISSIMUS FLAP TO BREAST Left 06/25/2018   Procedure: LEFT LATISSIMUS FLAP TO LEFT CHEST;  Surgeon: Irene Limbo, MD;  Location: [place];  Service: Plastics;  Laterality: Left;  . LIPOSUCTION Right 06/24/2019   Procedure: LIPOSUCTION RIGHT CHEST;  Surgeon: Irene Limbo, MD;  Location: [facility];  Service: Plastics;  Laterality: Right;  . MASTECTOMY Bilateral 07/16/2017   LEFT SKIN SPARING MASTECTOMY WITH LEFT RADIOACTIVE SEED TARGETED AXILLARY LYMPH NODE EXCISION AND AXILLARY SENTINEL LYMPH NODE BIOPSY; RIGHT PROPHYLACTIC SKIN SPARING MASTECTOMY [name] 07/16/2017  . MASTECTOMY WITH RADIOACTIVE SEED GUIDED EXCISION AND AXILLARY SENTINEL LYMPH NODE  BIOPSY Left 07/16/2017   Procedure: LEFT SKIN SPARING MASTECTOMY WITH LEFT RADIOACTIVE SEED TARGETED AXILLARY LYMPH NODE EXCISION AND AXILLARY SENTINEL LYMPH NODE BIOPSY;  Surgeon: [name], MD;  Location: [place];  Service: General;  Laterality: Left;  . NIPPLE SPARING MASTECTOMY Right 07/16/2017   Procedure: RIGHT PROPHYLACTIC SKIN SPARING MASTECTOMY;  Surgeon: [name], MD;  Location: [place];  Service: General;  Laterality: Right;  . PORT-A-CATH REMOVAL N/A 07/16/2017   Procedure: REMOVAL PORT-A-CATH;  Surgeon: [name], MD;  Location: [place];  Service: General;  Laterality: N/A;  . PORTA CATH REMOVAL  07/16/2017  . PORTACATH PLACEMENT N/A 01/14/2017   Procedure: INSERTION PORT-A-CATH WITH [place];  Surgeon: [name], MD;  Location: [place];  Service: General;  Laterality: N/A;  . REMOVAL OF BILATERAL TISSUE EXPANDERS WITH PLACEMENT OF BILATERAL BREAST IMPLANTS Bilateral 06/25/2018   Procedure: REMOVAL OF BILATERAL TISSUE EXPANDERS WITH PLACEMENT OF BILATERAL SILICONE BREAST IMPLANTS;  Surgeon: Irene Limbo, MD;  Location: [facility];  Service: Plastics;  Laterality: Bilateral;  . ROBOTIC ASSISTED TOTAL HYSTERECTOMY WITH BILATERAL SALPINGO OOPHERECTOMY Bilateral 11/03/2017   Procedure: XI ROBOTIC ASSISTED TOTAL HYSTERECTOMY WITH BILATERAL SALPINGO OOPHORECTOMY;  Surgeon: [name], MD;  Location: [facility];  Service: Gynecology;  Laterality: Bilateral;    I have reviewed the social history and family history with the patient and they are unchanged from previous note.  ALLERGIES:  is allergic  to amoxicillin; other; chlorhexidine gluconate; and nicotine.  MEDICATIONS:  Current Outpatient Medications  Medication Sig Dispense Refill  . Famotidine (PEPCID PO) Take 1 tablet by mouth daily as needed (acid reflux).    . hydrocortisone cream 1 % Apply 1 application topically daily as needed (for redness on face).    Marland Kitchen ibuprofen (ADVIL) 800 MG tablet Take 1 tablet (800 mg total) by  mouth every 8 (eight) hours as needed for mild pain or moderate pain. 30 tablet 0  . methocarbamol (ROBAXIN) 500 MG tablet Take 1 tablet (500 mg total) by mouth every 8 (eight) hours as needed for muscle spasms. 30 tablet 0  . sulfamethoxazole-trimethoprim (BACTRIM DS) 800-160 MG tablet Take 1 tablet by mouth 2 (two) times daily. 14 tablet 0  . venlafaxine XR (EFFEXOR XR) 150 MG 24 hr capsule Take 1 capsule (150 mg total) by mouth daily with breakfast. 30 capsule 5   No current facility-administered medications for this visit.    PHYSICAL EXAMINATION: ECOG PERFORMANCE STATUS: 0 - Asymptomatic  Vitals:   08/11/19 0903  BP: 122/84  Pulse: 89  Resp: 17  Temp: 97.8 F (36.6 C)  SpO2: 98%   Filed Weights   08/11/19 0903  Weight: 207 lb 11.2 oz (94.2 kg)    GENERAL:alert, no distress and comfortable SKIN: skin color, texture, turgor are normal, no rashes or significant lesions EYES: normal, Conjunctiva are pink and non-injected, sclera clear  NECK: supple, thyroid normal size, non-tender, without nodularity LYMPH:  no palpable lymphadenopathy in the cervical, axillary  LUNGS: clear to auscultation and percussion with normal breathing effort HEART: regular rate & rhythm and no murmurs and no lower extremity edema ABDOMEN:abdomen soft, non-tender and normal bowel sounds Musculoskeletal:no cyanosis of digits and no clubbing  NEURO: alert & oriented x 3 with fluent speech, no focal motor/sensory deficits BREAST: S/p b/l mastectomy and reconstruction: Surgical incision healed well (+) slight asymmetry with L>R. No palpable mass, nodules or adenopathy bilaterally. Breast exam benign.    LABORATORY DATA:  I have reviewed the data as listed CBC Latest Ref Rng & Units 08/11/2019 02/04/2019 06/18/2018  WBC 4.0 - 10.5 K/uL 5.5 6.9 7.3  Hemoglobin 12.0 - 15.0 g/dL 14.1 14.1 13.9  Hematocrit 36.0 - 46.0 % 41.4 41.6 42.8  Platelets 150 - 400 K/uL 240 226 232     CMP Latest Ref Rng & Units  08/11/2019 02/04/2019 06/18/2018  Glucose 70 - 99 mg/dL 104(H) 86 78  BUN 6 - 20 mg/dL 21(H) 18 21(H)  Creatinine 0.44 - 1.00 mg/dL 0.82 0.99 0.95  Sodium 135 - 145 mmol/L 139 140 139  Potassium 3.5 - 5.1 mmol/L 4.4 4.2 3.8  Chloride 98 - 111 mmol/L 106 106 106  CO2 22 - 32 mmol/L _0 Calcium 8.9 - 10.3 mg/dL 9.0 9.1 9.5  Total Protein 6.5 - 8.1 g/dL 7.4 7.5 -  Total Bilirubin 0.3 - 1.2 mg/dL 0.2(L) 0.3 -  Alkaline Phos 38 - 126 U/L 91 92 -  AST 15 - 41 U/L 16 24 -  ALT 0 - 44 U/L 28 35 -      RADIOGRAPHIC STUDIES: I have personally reviewed the radiological images as listed and agreed with the findings in the report. No results found.   ASSESSMENT & PLAN:  TYAN LASURE is a 44 y.o. female with   1. Malignant neoplasm of upper-outer quadrant of left breast in female, ductal carcinoma, cT2N1M0, Stage IIIa, ER weakly15%+, PR - , HER2 - ,  Grade 3, ypT1cN0M0, triple negative -She was diagnosed in 12/2016. She is s/p neoadjuvant chemo, AC-CT, B/l mastectomy and reconstruction surgery, adjuvant radiation and adjuvant Xeloda.  -Her tumor was weakly ER positive on initial biopsy, and became triple negative after neoadjuvant chemotherapy. I think the benefit of antiestrogen therapy is very small and I did not recommend it.  -She completed total hysterectomy and BSO on 11/03/17, pathology was benign. -She was interested in the clinical trial for adjuvant immunotherapy Keytruda in triple negative cancer.Shewas screened butnoteligiblefor the clinical trial. -She underwent revision of right breast reconstruction with silicone implant exchange on 06/24/19 with Dr. Iran Planas.  -She is clinically doing well. Lab reviewed, her CBC and CMP are within normal limits. Her physical exam was unremarkable with slight asymmetry of breast, L>R. There is no clinical concern for recurrence. -I discussed her risk of recurrence decreases the farther she is from diagnosis. Continue Surveillance. No need  for mammogram given b/l mastectomy.  -She notes she may have more reconstruction at some point.  -f/u in 6 months    2. 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 Analysis also detecteda Variant of Uncertain Significance (VUS) in the NBN gene calledc.628G>T (p.Val210Phe). -She underwentb/lmastectomy with Dr. Donne Hazel on 07/16/17 and will have Reconstruction with plastic surgeon Dr. Ashley Mariner.  -She underwent total hysterectomy and BSO with Dr. Denman George on 11/03/17.  -I previously explained when her children get older they need to get tested and start cancer screenings earlier.    3. Mood swing, Hot flashes -Secondary to BSO. -She has been onCelexawhichhas helped. [place] is back to normal and hot flashes improved. Will continue  -I also discussed her weight is likely to increase postmenopause. She has gained 5 pounds in 6 months She will continue to cut carbohydrates in diet and increase exercise.   -Her PCP started her on Effexor on 02/03/19 to help her hot flashes and mood swings. Her mood swings have become significant so will increase Effexor to 166m starting 08/11/19.  -I recommend she see her PCP MZacarias Pontesat least once a year to monitor her cholesterol. If not seen this year I will check her Cholesterol.    4. Bone Health  -Given she is s/p BSO I discussed her bone density will start to decrease postmenopausal. Will obtain baseline DEXA in 2021 or 2022 -I recommend she start Vit D 1000 units daily.    PLAN:  -I refilled Effexor today and increased to 1575m -She is clinically doing well, will continue surveillance -lab andf/u in6m19month No problem-specific Assessment & Plan notes found for this encounter.   No orders of the defined types were placed in this encounter.  All questions were answered. The patient knows to call the clinic with any problems, questions or concerns. No barriers to  learning was detected. The total time spent in the appointment was 25 minutes.     YanTruitt MerleD 08/11/2019   I, AmoJoslyn Devonm acting as scribe for YanTruitt MerleD.   I have reviewed the above documentation for accuracy and completeness, and I agree with the above.

## 2019-08-11 ENCOUNTER — Other Ambulatory Visit: Payer: Self-pay

## 2019-08-11 ENCOUNTER — Inpatient Hospital Stay: Payer: Medicaid Other

## 2019-08-11 ENCOUNTER — Inpatient Hospital Stay (HOSPITAL_BASED_OUTPATIENT_CLINIC_OR_DEPARTMENT_OTHER): Payer: Medicaid Other | Admitting: Hematology

## 2019-08-11 ENCOUNTER — Encounter: Payer: Self-pay | Admitting: Hematology

## 2019-08-11 VITALS — BP 122/84 | HR 89 | Temp 97.8°F | Resp 17 | Ht 64.0 in | Wt 207.7 lb

## 2019-08-11 DIAGNOSIS — Z170 Estrogen receptor positive status [ER+]: Secondary | ICD-10-CM | POA: Diagnosis not present

## 2019-08-11 DIAGNOSIS — R232 Flushing: Secondary | ICD-10-CM | POA: Diagnosis not present

## 2019-08-11 DIAGNOSIS — F329 Major depressive disorder, single episode, unspecified: Secondary | ICD-10-CM | POA: Diagnosis not present

## 2019-08-11 DIAGNOSIS — Z90722 Acquired absence of ovaries, bilateral: Secondary | ICD-10-CM | POA: Diagnosis not present

## 2019-08-11 DIAGNOSIS — K219 Gastro-esophageal reflux disease without esophagitis: Secondary | ICD-10-CM | POA: Diagnosis not present

## 2019-08-11 DIAGNOSIS — Z90710 Acquired absence of both cervix and uterus: Secondary | ICD-10-CM | POA: Diagnosis not present

## 2019-08-11 DIAGNOSIS — Z9221 Personal history of antineoplastic chemotherapy: Secondary | ICD-10-CM | POA: Diagnosis not present

## 2019-08-11 DIAGNOSIS — F17210 Nicotine dependence, cigarettes, uncomplicated: Secondary | ICD-10-CM | POA: Diagnosis not present

## 2019-08-11 DIAGNOSIS — Z79899 Other long term (current) drug therapy: Secondary | ICD-10-CM | POA: Diagnosis not present

## 2019-08-11 DIAGNOSIS — C50412 Malignant neoplasm of upper-outer quadrant of left female breast: Secondary | ICD-10-CM

## 2019-08-11 DIAGNOSIS — Z9013 Acquired absence of bilateral breasts and nipples: Secondary | ICD-10-CM | POA: Diagnosis not present

## 2019-08-11 DIAGNOSIS — D696 Thrombocytopenia, unspecified: Secondary | ICD-10-CM | POA: Diagnosis not present

## 2019-08-11 DIAGNOSIS — Z923 Personal history of irradiation: Secondary | ICD-10-CM | POA: Diagnosis not present

## 2019-08-11 DIAGNOSIS — Z1501 Genetic susceptibility to malignant neoplasm of breast: Secondary | ICD-10-CM | POA: Diagnosis not present

## 2019-08-11 LAB — CMP (CANCER CENTER ONLY)
ALT: 28 U/L (ref 0–44)
AST: 16 U/L (ref 15–41)
Albumin: 4 g/dL (ref 3.5–5.0)
Alkaline Phosphatase: 91 U/L (ref 38–126)
Anion gap: 8 (ref 5–15)
BUN: 21 mg/dL — ABNORMAL HIGH (ref 6–20)
CO2: 25 mmol/L (ref 22–32)
Calcium: 9 mg/dL (ref 8.9–10.3)
Chloride: 106 mmol/L (ref 98–111)
Creatinine: 0.82 mg/dL (ref 0.44–1.00)
GFR, Est AFR Am: >60 mL/min (ref >60)
GFR, Estimated: >60 mL/min (ref >60)
Glucose, Bld: 104 mg/dL — ABNORMAL HIGH (ref 70–99)
Potassium: 4.4 mmol/L (ref 3.5–5.1)
Sodium: 139 mmol/L (ref 135–145)
Total Bilirubin: 0.2 mg/dL — ABNORMAL LOW (ref 0.3–1.2)
Total Protein: 7.4 g/dL (ref 6.5–8.1)

## 2019-08-11 LAB — CBC WITH DIFFERENTIAL (CANCER CENTER ONLY)
Abs Immature Granulocytes: 0.02 10*3/uL (ref 0.00–0.07)
Basophils Absolute: 0 10*3/uL (ref 0.0–0.1)
Basophils Relative: 1 %
Eosinophils Absolute: 0.1 10*3/uL (ref 0.0–0.5)
Eosinophils Relative: 2 %
HCT: 41.4 % (ref 36.0–46.0)
Hemoglobin: 14.1 g/dL (ref 12.0–15.0)
Immature Granulocytes: 0 %
Lymphocytes Relative: 26 %
Lymphs Abs: 1.4 10*3/uL (ref 0.7–4.0)
MCH: 29.3 pg (ref 26.0–34.0)
MCHC: 34.1 g/dL (ref 30.0–36.0)
MCV: 86.1 fL (ref 80.0–100.0)
Monocytes Absolute: 0.5 10*3/uL (ref 0.1–1.0)
Monocytes Relative: 9 %
Neutro Abs: 3.4 10*3/uL (ref 1.7–7.7)
Neutrophils Relative %: 62 %
Platelet Count: 240 10*3/uL (ref 150–400)
RBC: 4.81 MIL/uL (ref 3.87–5.11)
RDW: 12.5 % (ref 11.5–15.5)
WBC Count: 5.5 10*3/uL (ref 4.0–10.5)
nRBC: 0 % (ref 0.0–0.2)

## 2019-08-11 MED ORDER — VENLAFAXINE HCL ER 150 MG PO CP24: 150.0000 mg | ORAL_CAPSULE | Freq: Every day | ORAL | 5 refills | Status: DC

## 2019-08-11 MED FILL — VENLAFAXINE HCL ER 150 MG C: 150 | 30 days supply | Qty: 30 | Fill #0

## 2019-08-12 ENCOUNTER — Telehealth: Payer: Self-pay | Admitting: Hematology

## 2019-08-12 NOTE — Telephone Encounter (Signed)
Scheduled appt per 3/4 los.  Left a voice message of the appt date and time.

## 2019-09-13 MED FILL — VENLAFAXINE HCL ER 150 MG C: 150 | 30 days supply | Qty: 30 | Fill #1

## 2019-11-29 MED FILL — VENLAFAXINE HCL ER 150 MG C: 150 | 30 days supply | Qty: 30 | Fill #3

## 2019-12-20 MED FILL — ONDANSETRON HCL 4 MG TABS: 4 | 6 days supply | Qty: 20 | Fill #0

## 2019-12-20 MED FILL — oxyCODONE HCL 5 MG TABS: 5 | 3 days supply | Qty: 20 | Fill #0

## 2019-12-27 IMAGING — US US PELVIS COMPLETE
1 series · 14 of 25 positions shown · non-contrast
Comparison: CT abdomen pelvis 01/15/2017

CLINICAL DATA: GF8EA gene mutation, assess ovaries

EXAM:
TRANSABDOMINAL AND TRANSVAGINAL ULTRASOUND OF PELVIS
TECHNIQUE: Both transabdominal and transvaginal ultrasound examinations of the
pelvis were performed. Transabdominal technique was performed for
global imaging of the pelvis including uterus, ovaries, adnexal
regions, and pelvic cul-de-sac. It was necessary to proceed with
endovaginal exam following the transabdominal exam to visualize the
endometrium and RIGHT ovary.

[Series 1: us pelvis complete · 0.24mm/px · 14 of 41 slices shown]
[im 1/41]
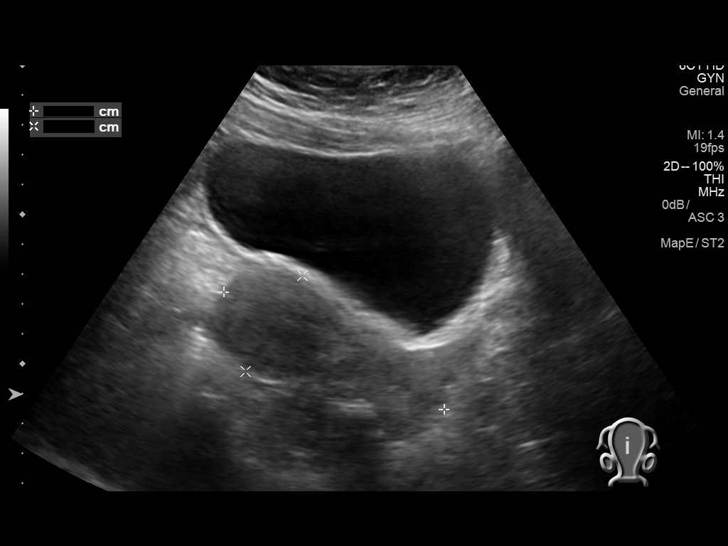
[im 4/41]
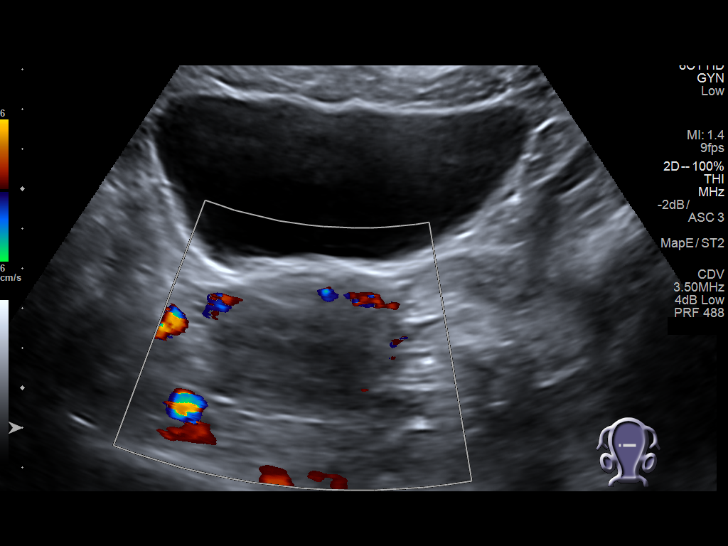
[im 7/41]
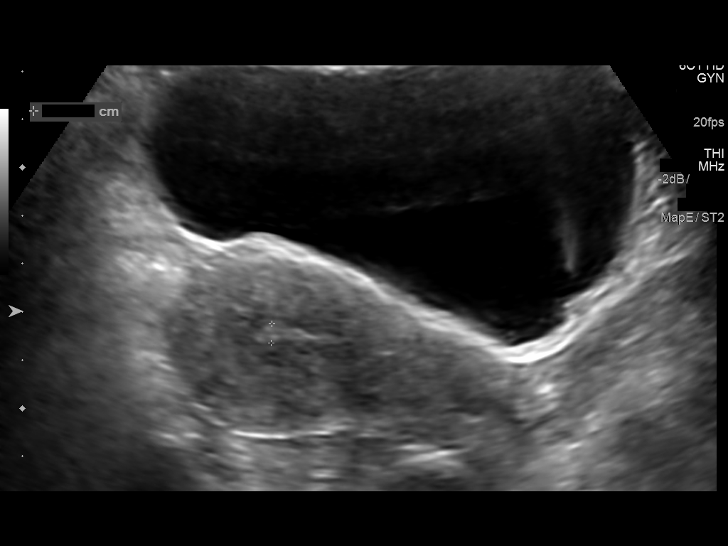
[im 11/41]
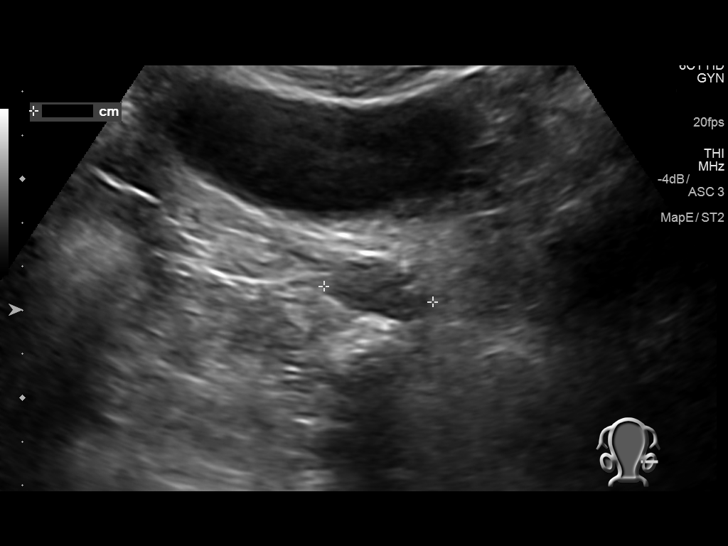
[im 14/41]
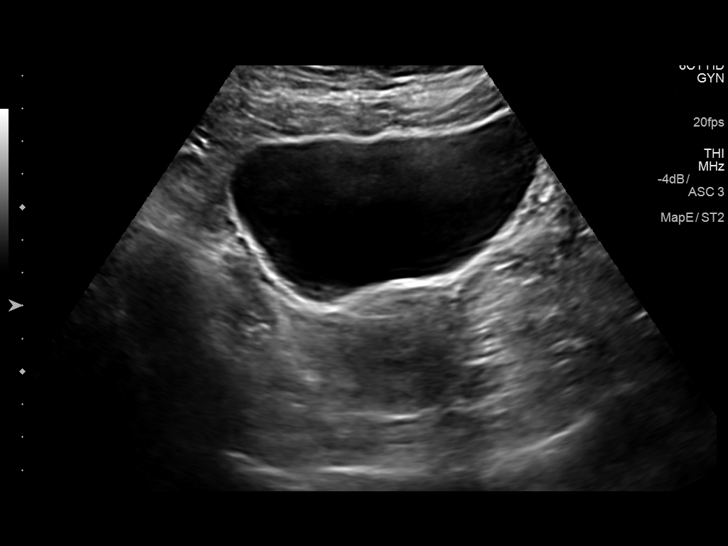
[im 16/41]
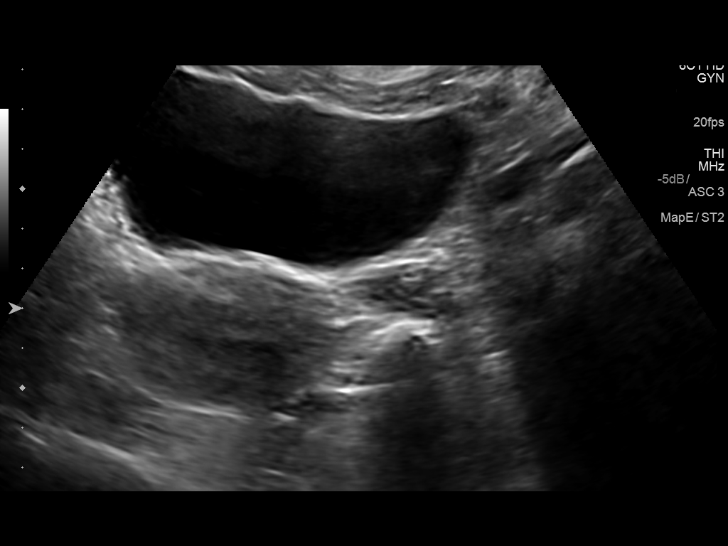
[im 19/41]
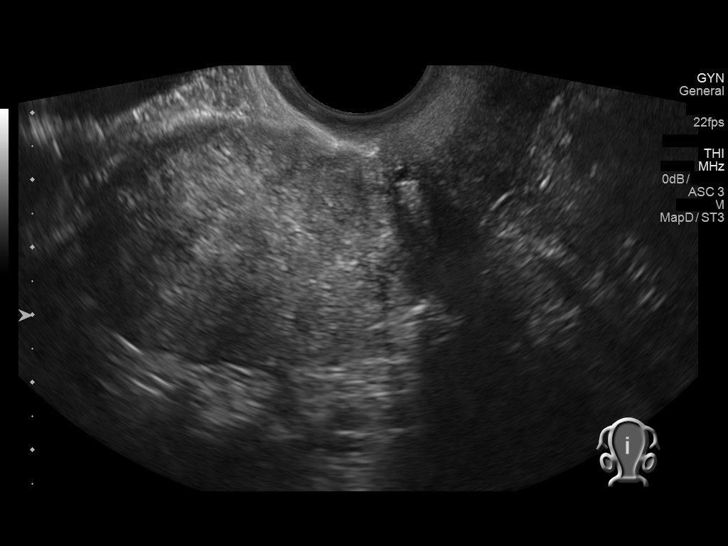
[im 22/41]
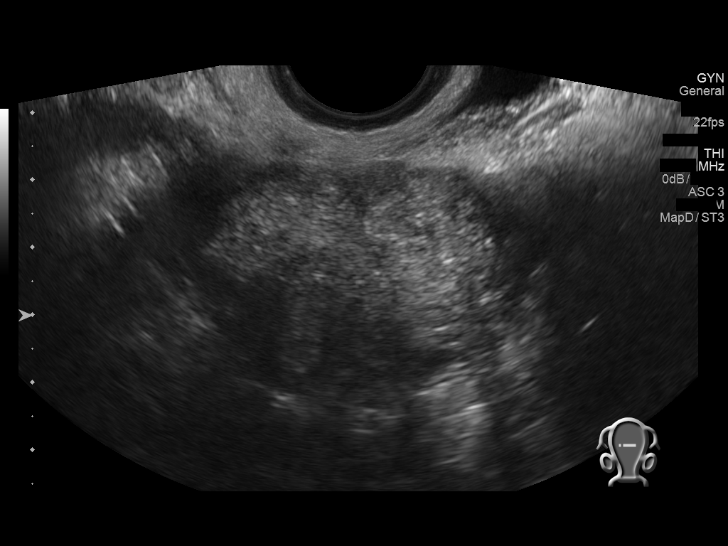
[im 26/41]
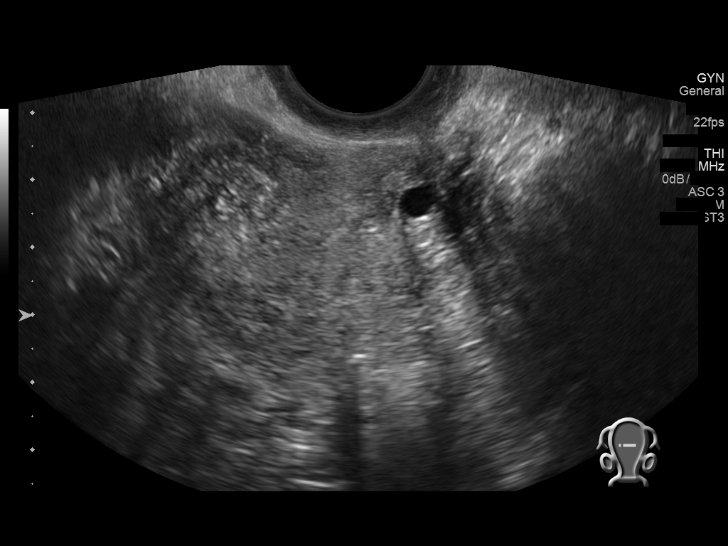
[im 27/41]
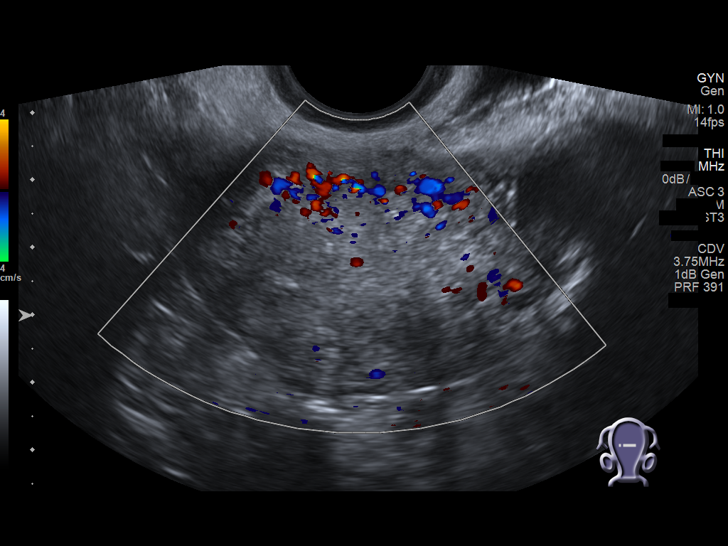
[im 31/41]
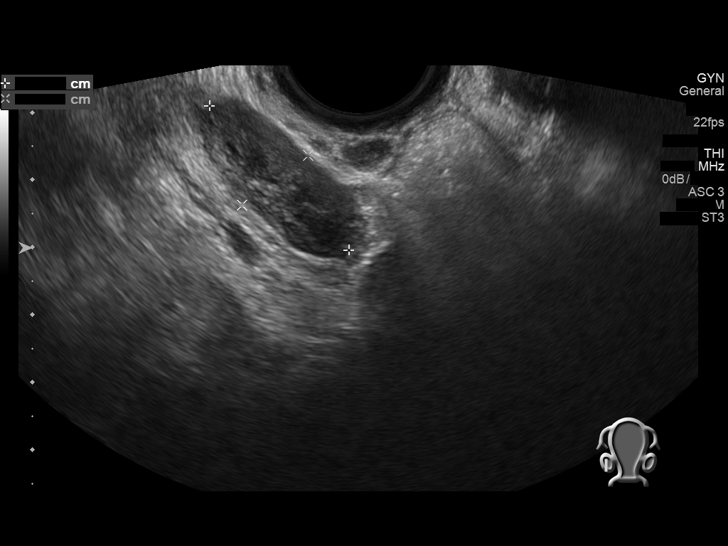
[im 34/41]
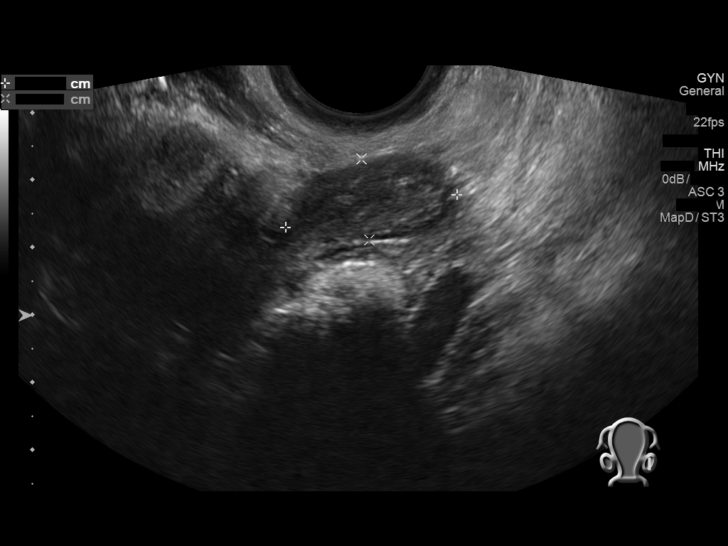
[im 37/41]
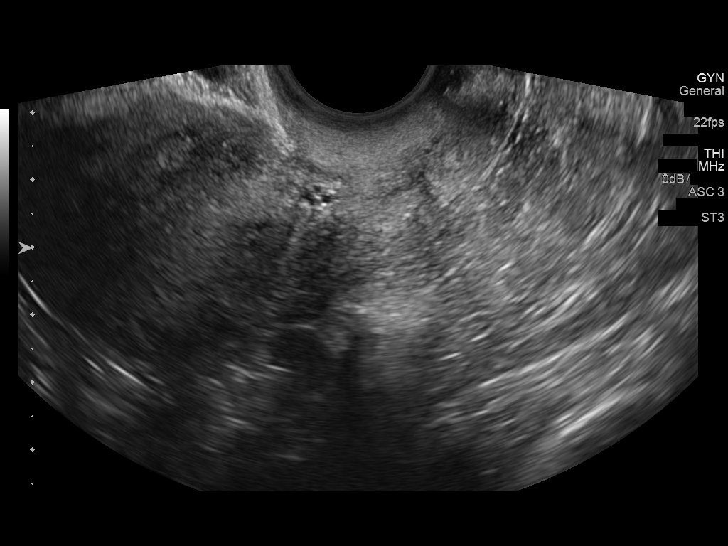
[im 41/41]
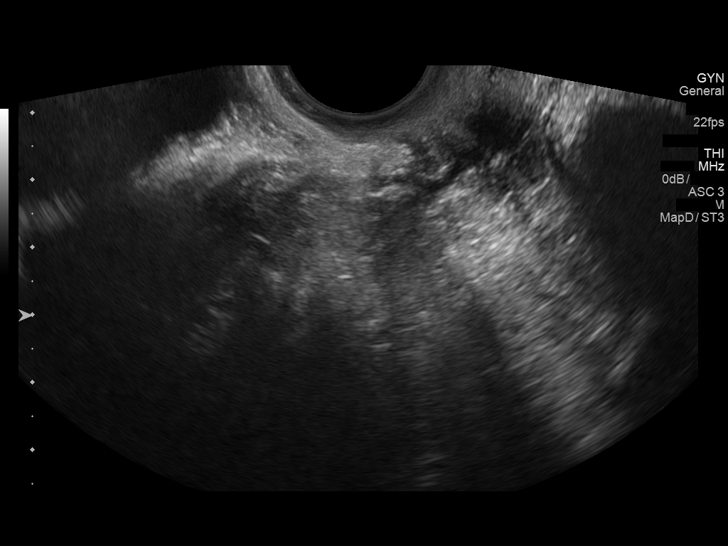

[14 of 25 positions shown; findings below may reference images not displayed]

FINDINGS: Uterus

Measurements: 8.3 x 4.0 x 5.3 cm. Inhomogeneous myometrium without
focal mass.

Endometrium

Thickness: 3 mm thick, normal. No endometrial fluid or focal
abnormality.

Right ovary

Measurements: 3.0 x 1.2 x 2.0 cm.  Normal appearance without mass.

Left ovary

Measurements: 2.6 x 1.2 x 2.3 cm.  Normal appearance without mass.

Other findings

No abnormal free fluid.
IMPRESSION: No focal sonographic abnormalities identified.

## 2019-12-29 MED FILL — VENLAFAXINE HCL ER 150 MG C: 150 | 30 days supply | Qty: 30 | Fill #4

## 2020-01-03 ENCOUNTER — Ambulatory Visit (INDEPENDENT_AMBULATORY_CARE_PROVIDER_SITE_OTHER): Payer: Medicaid Other | Admitting: Internal Medicine

## 2020-01-03 ENCOUNTER — Encounter: Payer: Self-pay | Admitting: Internal Medicine

## 2020-01-03 VITALS — BP 122/85 | HR 102 | Temp 98.0°F | Ht 64.0 in | Wt 212.9 lb

## 2020-01-03 DIAGNOSIS — R3915 Urgency of urination: Secondary | ICD-10-CM

## 2020-01-03 DIAGNOSIS — N951 Menopausal and female climacteric states: Secondary | ICD-10-CM

## 2020-01-03 DIAGNOSIS — Z90710 Acquired absence of both cervix and uterus: Secondary | ICD-10-CM

## 2020-01-03 DIAGNOSIS — R300 Dysuria: Secondary | ICD-10-CM

## 2020-01-03 DIAGNOSIS — E669 Obesity, unspecified: Secondary | ICD-10-CM

## 2020-01-03 DIAGNOSIS — Z90721 Acquired absence of ovaries, unilateral: Secondary | ICD-10-CM | POA: Diagnosis not present

## 2020-01-03 MED ORDER — NITROFURANTOIN MONOHYD MACRO 100 MG PO CAPS: 100.0000 mg | ORAL_CAPSULE | Freq: Two times a day (BID) | ORAL | 0 refills | Status: AC

## 2020-01-03 NOTE — Patient Instructions (Signed)
Thank you for allowing [place] to provide your care today. Today we discussed your urinary symptoms    I have ordered lipid panel, urine culture labs for you. I will call if any are abnormal.    Today we made the following changes to your medications.    Please take nitrofurantoin 158m BID for 5 days  Please follow-up as needed.    Should you have any questions or concerns please call the internal medicine clinic at [phone]     Urinary Tract Infection, Adult A urinary tract infection (UTI) is an infection of any part of the urinary tract. The urinary tract includes:  The kidneys.  The ureters.  The bladder.  The urethra. These organs make, store, and get rid of pee (urine) in the body. What are the causes? This is caused by germs (bacteria) in your genital area. These germs grow and cause swelling (inflammation) of your urinary tract. What increases the risk? You are more likely to develop this condition if:  You have a small, thin tube (catheter) to drain pee.  You cannot control when you pee or poop (incontinence).  You are female, and: ? You use these methods to prevent pregnancy:  A medicine that kills sperm (spermicide).  A device that blocks sperm (diaphragm). ? You have low levels of a female hormone (estrogen). ? You are pregnant.  You have genes that add to your risk.  You are sexually active.  You take antibiotic medicines.  You have trouble peeing because of: ? A prostate that is bigger than normal, if you are female. ? A blockage in the part of your body that drains pee from the bladder (urethra). ? A kidney stone. ? A nerve condition that affects your bladder (neurogenic bladder). ? Not getting enough to drink. ? Not peeing often enough.  You have other conditions, such as: ? Diabetes. ? A weak disease-fighting system (immune system). ? Sickle cell disease. ? Gout. ? Injury of the spine. What are the signs or symptoms? Symptoms of this  condition include:  Needing to pee right away (urgently).  Peeing often.  Peeing small amounts often.  Pain or burning when peeing.  Blood in the pee.  Pee that smells bad or not like normal.  Trouble peeing.  Pee that is cloudy.  Fluid coming from the vagina, if you are female.  Pain in the belly or lower back. Other symptoms include:  Throwing up (vomiting).  No urge to eat.  Feeling mixed up (confused).  Being tired and grouchy (irritable).  A fever.  Watery poop (diarrhea). How is this treated? This condition may be treated with:  Antibiotic medicine.  Other medicines.  Drinking enough water. Follow these instructions at home:  Medicines  Take over-the-counter and prescription medicines only as told by your doctor.  If you were prescribed an antibiotic medicine, take it as told by your doctor. Do not stop taking it even if you start to feel better. General instructions  Make sure you: ? Pee until your bladder is empty. ? Do not hold pee for a long time. ? Empty your bladder after sex. ? Wipe from front to back after pooping if you are a female. Use each tissue one time when you wipe.  Drink enough fluid to keep your pee pale yellow.  Keep all follow-up visits as told by your doctor. This is important. Contact a doctor if:  You do not get better after 1-2 days.  Your symptoms go away and  then come back. Get help right away if:  You have very bad back pain.  You have very bad pain in your lower belly.  You have a fever.  You are sick to your stomach (nauseous).  You are throwing up. Summary  A urinary tract infection (UTI) is an infection of any part of the urinary tract.  This condition is caused by germs in your genital area.  There are many risk factors for a UTI. These include having a small, thin tube to drain pee and not being able to control when you pee or poop.  Treatment includes antibiotic medicines for germs.  Drink  enough fluid to keep your pee pale yellow. This information is not intended to replace advice given to you by your health care provider. Make sure you discuss any questions you have with your health care provider. Document Revised: 05/13/2018 Document Reviewed: 12/03/2017 Elsevier Patient Education  2020 [employer].

## 2020-01-03 NOTE — Progress Notes (Signed)
CC: Urinary urgency  HPI: Kathryn Stanley is a 44 y.o. with PMH listed below presenting with complaint of urinary urgency. Please see problem based assessment and plan for further details.  Past Medical History:  Diagnosis Date  . Breast cancer, left breast ([place]) dx'd 01/2017  . Complication of anesthesia   . Dermatologic problem    possible psoriasis. Pt has not had been diagnosed by dermatology.  . 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 GERD (gastroesophageal reflux disease)    occational  . PONV (postoperative nausea and vomiting)   . Tobacco dependence    trying to quit with nicotene patches  . UTI (urinary tract infection) 04/24/2017  . Viral upper respiratory infection 03/11/2018   Review of Systems: Review of Systems  Constitutional: Negative for chills, fever and malaise/fatigue.  Eyes: Negative for blurred vision.  Respiratory: Negative for shortness of breath.   Cardiovascular: Negative for chest pain, palpitations and leg swelling.  Gastrointestinal: Negative for diarrhea, nausea and vomiting.  Genitourinary: Positive for frequency and urgency. Negative for dysuria, flank pain and hematuria.  All other systems reviewed and are negative.    Physical Exam: Vitals:   01/03/20 1534  BP: 122/85  Pulse: 102  Temp: 98 F (36.7 C)  TempSrc: Oral  SpO2: 96%  Weight: (!) 212 lb 14.4 oz (96.6 kg)  Height: _0  (1.626 m)   Gen: Well-developed, well nourished, NAD HEENT: NCAT head, hearing intact CV: RRR, S1, S2 normal Pulm: CTAB, No rales, no wheezes Abd: Soft, BS+, Suprapubic tenderness to palpation, no rebound Extm: ROM intact, Peripheral pulses intact, No peripheral edema Skin: Dry, Warm, normal turgor  Assessment & Plan:   Menopausal symptoms Mentions significant improvement in mood swings after starting venlafaxine. Continuing to experience hot flashes. Her oncologist increased  her venlafaxine to 114m. Mentions continuing to experience hot flashes but states symptoms are manageable.  - C/w venlafaxine XR 1558mdaily  Urinary urgency Kathryn Stanley is a [age]o F w/ PMH of Breast Ca s/p mastectomy/radiation/[facility] complaint of urinary urgency and suprapubic tenderness. She mentions being her usual state of health until 2 days prior when she had acute onset urinary urgency, abdominal discomfort and frequency. She denies dysuria or flank pain or pelvic fullness. She mentions this feels similar to prior UTIs. She is currently in early menopause due to oophorectomy for her breast cancer but denies significant vaginal atrophy or incontinence  A/P Present w/ urinary urgency. Acute onset and prior hx suggest UTI although not endorsing dysuria. Urine dipstick w/ + leuks, - nitrites. Will provide script for Macrobid but advised to await til sensitivities due to hx of recurrent UTIs  - F/u urine culture - Macrobid 10046mID for 5 days script provided  S/P hysterectomy with oophorectomy Has hx of hysterectomy with oophorectomy for her breast cancer. She mentions that she was told by her gynecologist that she has a 'floating bladder' and was advised to f/u with urology. Requesting referral for urology.  - Referral placed per patient request  Obesity (BMI 35.0-39.9 without comorbidity) Continues to have weight gain despite exercise and dietary changes. Current BMI 36. Weight increased from 203 to 212 since 01/2019. Was previously referred to nutrition counseling but did not follow up. Advised to c/w diet and exercise. With hx of early menopause, at higher risk for CVD event. Will assesss for hyperlipidemia.  - Lipid panel  Addendum: LDL  120, HDL 34. ASCVD risk low at 2%. However may be lower than true risk due to cancer and early menopause. Will discuss with pt regarding risk/benefit of treatment   Patient discussed with Dr. Evette Doffing  -Gilberto Better, [facility] Pager: [phone]

## 2020-01-04 ENCOUNTER — Encounter: Payer: Self-pay | Admitting: Internal Medicine

## 2020-01-04 DIAGNOSIS — E782 Mixed hyperlipidemia: Secondary | ICD-10-CM | POA: Insufficient documentation

## 2020-01-04 DIAGNOSIS — Z90710 Acquired absence of both cervix and uterus: Secondary | ICD-10-CM | POA: Insufficient documentation

## 2020-01-04 DIAGNOSIS — R3915 Urgency of urination: Secondary | ICD-10-CM | POA: Insufficient documentation

## 2020-01-04 LAB — POCT URINALYSIS DIPSTICK
Bilirubin, UA: NEGATIVE
Glucose, UA: NEGATIVE
Ketones, UA: NEGATIVE
Nitrite, UA: NEGATIVE
Protein, UA: NEGATIVE
Spec Grav, UA: 1.025 (ref 1.010–1.025)
Urobilinogen, UA: 0.2 E.U./dL
pH, UA: 6 (ref 5.0–8.0)

## 2020-01-04 LAB — LIPID PANEL
Chol/HDL Ratio: 5.9 ratio — ABNORMAL HIGH (ref 0.0–4.4)
Cholesterol, Total: 199 mg/dL (ref 100–199)
HDL: 34 mg/dL — ABNORMAL LOW (ref >39)
LDL Chol Calc (NIH): 120 mg/dL — ABNORMAL HIGH (ref 0–99)
Triglycerides: 253 mg/dL — ABNORMAL HIGH (ref 0–149)
VLDL Cholesterol Cal: 45 mg/dL — ABNORMAL HIGH (ref 5–40)

## 2020-01-04 NOTE — Assessment & Plan Note (Signed)
Continues to have weight gain despite exercise and dietary changes. Current BMI 36. Weight increased from 203 to 212 since 01/2019. Was previously referred to nutrition counseling but did not follow up. Advised to c/w diet and exercise. With hx of early menopause, at higher risk for CVD event. Will assesss for hyperlipidemia.  - Lipid panel  Addendum: LDL 120, HDL 34. ASCVD risk low at 2%. However may be lower than true risk due to cancer and early menopause. Will discuss with pt regarding risk/benefit of treatment

## 2020-01-04 NOTE — Assessment & Plan Note (Addendum)
Mentions significant improvement in mood swings after starting venlafaxine. Continuing to experience hot flashes. Her oncologist increased her venlafaxine to 120m. Mentions continuing to experience hot flashes but states symptoms are manageable.  - C/w venlafaxine XR 1532mdaily

## 2020-01-04 NOTE — Progress Notes (Signed)
Internal Medicine Clinic Attending  Case discussed with Dr. Lee  At the time of the visit.  We reviewed the resident's history and exam and pertinent patient test results.  I agree with the assessment, diagnosis, and plan of care documented in the resident's note.    

## 2020-01-04 NOTE — Assessment & Plan Note (Addendum)
Kathryn Stanley is a 44 yo F w/ PMH of Breast Ca s/p mastectomy/radiation/chemo presenting to [facility] w/ complaint of urinary urgency and suprapubic tenderness. She mentions being her usual state of health until 2 days prior when she had acute onset urinary urgency, abdominal discomfort and frequency. She denies dysuria or flank pain or pelvic fullness. She mentions this feels similar to prior UTIs. She is currently in early menopause due to oophorectomy for her breast cancer but denies significant vaginal atrophy or incontinence  A/P Present w/ urinary urgency. Acute onset and prior hx suggest UTI although not endorsing dysuria. Urine dipstick w/ + leuks, - nitrites. Will provide script for Macrobid but advised to await til sensitivities due to hx of recurrent UTIs  - F/u urine culture - Macrobid 150m BID for 5 days script provided

## 2020-01-04 NOTE — Assessment & Plan Note (Addendum)
Has hx of hysterectomy with oophorectomy for her breast cancer. She mentions that she was told by her gynecologist that she has a 'floating bladder' and was advised to f/u with urology. Requesting referral for urology.  - Referral placed per patient request

## 2020-01-05 ENCOUNTER — Telehealth: Payer: Self-pay | Admitting: Internal Medicine

## 2020-01-05 LAB — URINE CULTURE

## 2020-01-05 MED FILL — NITROFURANTOIN MONO-MCR 100: 100 | 5 days supply | Qty: 10 | Fill #0

## 2020-01-05 NOTE — Telephone Encounter (Signed)
Spoke with Kathryn Stanley regarding her culture result and her lipid panel. Advised to fill her antibiotics and take as prescribed. Discussed her cholesterol results and her future risk of ASCVD event. She states she is currently looking into diets focusing on lean proteins and vegetable and expresses motivation to focus on lifestyle modifications to improve her cholesterol. Advised to continue with lifestyle modifications.

## 2020-01-18 ENCOUNTER — Encounter: Payer: Self-pay | Admitting: Internal Medicine

## 2020-01-31 MED FILL — VENLAFAXINE HCL ER 150 MG C: 150 | 30 days supply | Qty: 30 | Fill #5

## 2020-02-15 NOTE — Progress Notes (Signed)
[facility]   Telephone:([phone] Fax:([phone]) [phone]   Clinic Follow up Note   Patient Care Team: Mosetta Anis, MD as PCP - Joylene Draft, MD as Consulting Physician (General Surgery) [name], MD as Consulting Physician (Hematology) [name], MD as Consulting Physician (Radiation Oncology)  Date of Service:  02/16/2020  CHIEF COMPLAINT: Follow-upleft breast cancer  SUMMARY OF ONCOLOGIC HISTORY: Oncology History Overview Note  Cancer Staging Malignant neoplasm of upper-outer quadrant of left breast in female, estrogen receptor positive ([place]) Staging form: Breast, AJCC 8th Edition - Clinical stage from 12/29/2016: Stage IIIA (cT2, cN1, cM0, G3, ER: Positive, PR: Negative, HER2: Negative) - Signed by [name], MD on 01/07/2017 - Pathologic: No Stage Recommended (ypT1c, pN0, cM0, G2, ER-, PR-, HER2-) - Unsigned Cancer Staging Malignant neoplasm of upper-outer quadrant of left breast in female, estrogen receptor positive ([place]) Staging form: Breast, AJCC 8th Edition - Clinical stage from 12/29/2016: Stage IIIA (cT2, cN1, cM0, G3, ER: Positive, PR: Negative, HER2: Negative) - Signed by [name], MD on 01/07/2017 - Pathologic: No Stage Recommended (ypT1c, pN0, cM0, G2, ER-, PR-, HER2-) - Unsigned     Malignant neoplasm of upper-outer quadrant of left breast in female, estrogen receptor positive ([place])  12/25/2016 Mammogram   [place] and [facility] Diagnostic Breast Tomo Bilateral 12/25/16 IMPRESSION: 1. Suspicious mass 2.3 x 1.6 x 3.0 cm  in the 130 o'clock location of the left breast 8cm from the nipple, biopsy recommended. Adjacent simple cyst is 1.9 cm. 2. Suspicious left axillary lymph node 2.2cm for which biopsy is indicated. 3. Indeterminate group of calcifications in the upper central portion of the left breast for which biopsy is recommended.    12/29/2016 Initial Biopsy   Diagnosis 12/29/16 1. Breast, left, needle core biopsy, stereotactic, upper inner  quadrant - FIBROCYSTIC CHANGES INCLUDING APOCRINE METAPLASIA WITH CALCIFICATIONS - NO MALIGNANCY IDENTIFIED 2. Breast, left, needle core biopsy, ultrasound, 1:30 o'clock - INVASIVE MAMMARY CARCINOMA, G3 - SEE COMMENT 3. Lymph node, needle/core biopsy, ultrasound, left axillary - METASTIC CARCINOMA INVOLVING ONE LYMPH NODE (1/1) E-cadherin is positive supporting a ductal origin.    12/29/2016 Receptors her2   ER 15%+, weak staining  PR - HER2- Ki67 10%    12/29/2016 Initial Diagnosis   Malignant neoplasm of upper-outer quadrant of left breast in female, estrogen receptor positive ([place])   01/13/2017 Echocardiogram   ECHO 01/13/17 Study Conclusions - Left ventricle: The cavity size was normal. Systolic function was   normal. The estimated ejection fraction was in the range of 60%   to 65%. Wall motion was normal; there were no regional wall   motion abnormalities. Left ventricular diastolic function   parameters were normal. - Mitral valve: There was trivial regurgitation. - Right atrium: The atrium was mildly dilated. - Tricuspid valve: There was trivial regurgitation.    01/14/2017 Surgery   Port placement by Dr. Donne Hazel    01/15/2017 Imaging   Whole Body bone Scan 01/15/17 IMPRESSION: 1.  No significant abnormality identified.   01/15/2017 Imaging   CT CAP W Contrast 01/15/17 IMPRESSION: 1. Left lateral breast mass with pathologic left axillary adenopathy including a 2.0 cm left axillary lymph node. 2. Several hypodense lesions in the liver are likely benign lesions such as cysts. However, these are technically too small to characterize by CT. Possibilities for further workup include surveillance or hepatic protocol MRI with and without contrast. 3. There is also a tiny hypodense lesion in the spleen which is technically nonspecific but  statistically highly likely to be benign. 4.  Prominent stool throughout the colon favors constipation   01/16/2017 - 06/05/2017  Chemotherapy   Neoadjuvant chemo AC every 2 weeks for 4 cycles starting on 01/16/2017 and ended 02/27/17. Then proceed with weekly taxol for 12 treatments starting 03/13/17. Added Carboplatin AUC 2 on 04/03/17 to weekly taxol. Due to symptomatic thrombocytopenia we held Carbo since cycle 8. Cycle 9 was post-poned due to neutropenia and Granix was added starting 05/12/17. Added carbo back at reduced dose AUC 1.5 with cycle 10 on 05/22/17. 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   Analysis also detected a Variant of Uncertain Significance (VUS) in the NBN gene called c.628G>T (p.Val210Phe)  Update: The Variant of Uncertain Significance NBN c.628G>T (p.Val210Phe) has been reclassified to "Likely Benign." Report date is 04/09/2018.    06/12/2017 Imaging   MR BREAST BILATERAL WO CONTRAST IMPRESSION: 1. Significant positive response to neoadjuvant chemotherapy. The index carcinoma in the left breast has significantly decreased in size. Left axillary adenopathy has also significantly improved. 2. No other evidence of malignancy.   07/16/2017 Surgery   Pt underwent a bilateral mastectomy and left SLN biopsy with Dr. Donne Hazel   07/16/2017 Pathology Results   Diagnosis 07/16/17 1. Breast, simple mastectomy, Right Prophylactic - FIBROCYSTIC CHANGES INCLUDING APOCRINE METAPLASIA - DUCT ECTASIA - CALCIFICATIONS - NO MALIGNANCY IDENTIFIED 2. Lymph node, sentinel, biopsy, Left Axillary - NO CARCINOMA IDENTIFIED IN ONE LYMPH NODE (0/1) - SEE COMMENT 3. Lymph node, sentinel, biopsy, Left - NO CARCINOMA IDENTIFIED IN ONE LYMPH NODE (0/1) - SEE COMMENT 4. Breast, simple mastectomy, Left - INVASIVE DUCTAL CARCINOMA, NOTTINGHAM GRADE 2 OF 3, 1.3 CM - MARGINS UNINVOLVED BY CARCINOMA (2 CM POSTERIOR MARGIN) - PREVIOUS BIOPSY SITE CHANGES - CALCIFICATIONS - SEE ONCOLOGY TABLE  BELOW   07/16/2017 Receptors her2   Results: IMMUNOHISTOCHEMICAL AND MORPHOMETRIC ANALYSIS PERFORMED MANUALLY Estrogen Receptor: 0%, NEGATIVE Progesterone Receptor: 0%, NEGATIVE Results: HER2 - NEGATIVE RATIO OF HER2/CEP17 SIGNALS 1.73 AVERAGE HER2 COPY NUMBER PER CELL 2.67   09/02/2017 - 10/16/2017 Radiation Therapy   Adjuvant radiation per Dr. Lisbeth Kading    11/03/2017 Surgery   XI ROBOTIC ASSISTED TOTAL HYSTERECTOMY WITH BILATERAL SALPINGO OOPHORECTOMY  by Dr. Denman George and Dr. Delsa Sale 11/03/17   11/23/2017 - 02/08/2018 Chemotherapy   Adjuvant Xeloda 2028m BID 2 weeks on and 1 week off   06/25/2018 Surgery   Bilateral breast reconstruction with Dr. TIran Planas 06/25/18       CURRENT THERAPY:  Surveillance  INTERVAL HISTORY:  JBRAD MCGAUGHYis here for a follow up of left breast cancer. She was last seen by me 6 months ago. She presents to the clinic alone. She is doing very well overall. She has been on keto diet, no sugar, no carbohydrate, and has lost a total of 15 2 months. She remains physically active, and exercise regularly. She has good appetite and energy level. She denies abdominal bloating, nausea, chest discomfort or other symptoms. Review of system otherwise negative.   MEDICAL HISTORY:  Past Medical History:  Diagnosis Date  . [place] cancer[place] breast [place] dx'd 01[place]2017  . Complication of anesthesia   . Dermatologic problem    possible psoriasis. Pt has not had been diagnosed by dermatology.  . 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 GERD (  gastroesophageal reflux disease)    occational  . PONV (postoperative nausea and vomiting)   . Tobacco dependence    trying to quit with nicotene patches  . UTI (urinary tract infection) 04/24/2017  . Viral upper respiratory infection 03/11/2018    SURGICAL HISTORY: Past Surgical History:  Procedure Laterality Date  . APPLICATION OF A-CELL OF  CHEST/ABDOMEN Right 06/25/2018   Procedure: ACELLULAR DERMIS TO RIGHT CHEST;  Surgeon: Irene Limbo, MD;  Location: [place];  Service: Plastics;  Laterality: Right;  . BREAST BIOPSY Left 12/2016  . BREAST IMPLANT EXCHANGE Right 06/24/2019   Procedure: REVISION RIGHT BREAST RECONSTRUCTION WITH SILICONE IMPLANT EXCAHNGE, STRATTICE TO RIGHT CHEST;  Surgeon: Irene Limbo, MD;  Location: [place];  Service: Plastics;  Laterality: Right;  . BREAST RECONSTRUCTION Bilateral 07/16/2017   BILATERAL BREAST RECONSTRUCTION WITH PLACEMENT OF TISSUE EXPANDER AND ALLODERM/notes 07/16/2017  . BREAST RECONSTRUCTION  06/25/2018  . CESAREAN SECTION  2002  . LATISSIMUS FLAP TO BREAST Left 06/25/2018   Procedure: LEFT LATISSIMUS FLAP TO LEFT CHEST;  Surgeon: Irene Limbo, MD;  Location: [place];  Service: Plastics;  Laterality: Left;  . LIPOSUCTION Right 06/24/2019   Procedure: LIPOSUCTION RIGHT CHEST;  Surgeon: Irene Limbo, MD;  Location: [place];  Service: Plastics;  Laterality: Right;  . MASTECTOMY Bilateral 07/16/2017   LEFT SKIN SPARING MASTECTOMY WITH LEFT RADIOACTIVE SEED TARGETED AXILLARY LYMPH NODE EXCISION AND AXILLARY SENTINEL LYMPH NODE BIOPSY; RIGHT PROPHYLACTIC SKIN SPARING MASTECTOMY [name] 07/16/2017  . MASTECTOMY WITH RADIOACTIVE SEED GUIDED EXCISION AND AXILLARY SENTINEL LYMPH NODE BIOPSY Left 07/16/2017   Procedure: LEFT SKIN SPARING MASTECTOMY WITH LEFT RADIOACTIVE SEED TARGETED AXILLARY LYMPH NODE EXCISION AND AXILLARY SENTINEL LYMPH NODE BIOPSY;  Surgeon: [name], MD;  Location: [place];  Service: General;  Laterality: Left;  . NIPPLE SPARING MASTECTOMY Right 07/16/2017   Procedure: RIGHT PROPHYLACTIC SKIN SPARING MASTECTOMY;  Surgeon: [name], MD;  Location: [place];  Service: General;  Laterality: Right;  . PORT-A-CATH REMOVAL N/A 07/16/2017   Procedure: REMOVAL PORT-A-CATH;  Surgeon: [name], MD;  Location: [place];  Service:  General;  Laterality: N/A;  . PORTA CATH REMOVAL  07/16/2017  . PORTACATH PLACEMENT N/A 01/14/2017   Procedure: INSERTION PORT-A-CATH WITH [place];  Surgeon: [name], MD;  Location: [place];  Service: General;  Laterality: N/A;  . REMOVAL OF BILATERAL TISSUE EXPANDERS WITH PLACEMENT OF BILATERAL BREAST IMPLANTS Bilateral 06/25/2018   Procedure: REMOVAL OF BILATERAL TISSUE EXPANDERS WITH PLACEMENT OF BILATERAL SILICONE BREAST IMPLANTS;  Surgeon: Irene Limbo, MD;  Location: [place];  Service: Plastics;  Laterality: Bilateral;  . ROBOTIC ASSISTED TOTAL HYSTERECTOMY WITH BILATERAL SALPINGO OOPHERECTOMY Bilateral 11/03/2017   Procedure: XI ROBOTIC ASSISTED TOTAL HYSTERECTOMY WITH BILATERAL SALPINGO OOPHORECTOMY;  Surgeon: [name], MD;  Location: [facility];  Service: Gynecology;  Laterality: Bilateral;    I have reviewed the social history and family history with the patient and they are unchanged from previous note.  ALLERGIES:  is allergic to amoxicillin, other, chlorhexidine gluconate, and nicotine.  MEDICATIONS:  Current Outpatient Medications  Medication Sig Dispense Refill  . Famotidine (PEPCID PO) Take 1 tablet by mouth daily as needed (acid reflux).    Marland Kitchen ibuprofen (ADVIL) 800 MG tablet Take 1 tablet (800 mg total) by mouth every 8 (eight) hours as needed for mild pain or moderate pain. 30 tablet 0  . venlafaxine XR (EFFEXOR XR) 150 MG 24 hr capsule Take 1 capsule (150 mg total) by mouth daily with breakfast. 30  capsule 5   No current facility-administered medications for this visit.    PHYSICAL EXAMINATION: ECOG PERFORMANCE STATUS: 0  Vitals:   02/16/20 0845  BP: 123/83  Pulse: 70  Resp: 18  Temp: 97.7 F (36.5 C)  SpO2: 98%   Filed Weights   02/16/20 0845  Weight: 197 lb 8 oz (89.6 kg)    GENERAL:alert, no distress and comfortable SKIN: skin color, texture, turgor are normal, no rashes or significant lesions EYES: normal, Conjunctiva are pink and non-injected,  sclera clear NECK: supple, thyroid normal size, non-tender, without nodularity LYMPH:  no palpable lymphadenopathy in the cervical, axillary  LUNGS: clear to auscultation and percussion with normal breathing effort HEART: regular rate & rhythm and no murmurs and no lower extremity edema ABDOMEN:abdomen soft, non-tender and normal bowel sounds Musculoskeletal:no cyanosis of digits and no clubbing  NEURO: alert & oriented x 3 with fluent speech, no focal motor/sensory deficits Breasts: Status post bilateral mastectomy surgical incisions are well-healed, mild skin pigmentation on the left side chest wall. No palpable lump or mass. No palpable adenopathy in bilateral axilla   LABORATORY DATA:  I have reviewed the data as listed CBC Latest Ref Rng & Units 02/16/2020 08/11/2019 02/04/2019  WBC 4.0 - 10.5 K/uL 5.7 5.5 6.9  Hemoglobin 12.0 - 15.0 g/dL 13.6 14.1 14.1  Hematocrit 36 - 46 % 40.3 41.4 41.6  Platelets 150 - 400 K/uL 232 240 226     CMP Latest Ref Rng & Units 02/16/2020 08/11/2019 02/04/2019  Glucose 70 - 99 mg/dL 97 104(H) 86  BUN 6 - 20 mg/dL 21(H) 21(H) 18  Creatinine 0.44 - 1.00 mg/dL 0.83 0.82 0.99  Sodium 135 - 145 mmol/L 138 139 140  Potassium 3.5 - 5.1 mmol/L 4.0 4.4 4.2  Chloride 98 - 111 mmol/L 109 106 106  CO2 22 - 32 mmol/L 21(L) 25 26  Calcium 8.9 - 10.3 mg/dL 9.1 9.0 9.1  Total Protein 6.5 - 8.1 g/dL 7.5 7.4 7.5  Total Bilirubin 0.3 - 1.2 mg/dL 0.4 0.2(L) 0.3  Alkaline Phos 38 - 126 U/L 78 91 92  AST 15 - 41 U/L 15 16 24   ALT 0 - 44 U/L 21 28 35      RADIOGRAPHIC STUDIES: I have personally reviewed the radiological images as listed and agreed with the findings in the report. No results found.   ASSESSMENT & PLAN:  Kathryn Stanley is a 44 y.o. female with    1. Malignant neoplasm of upper-outer quadrant of left breast in female, ductal carcinoma, cT2N1M0, Stage IIIa, ER weakly15%+, PR - , HER2 - , Grade 3, ypT1cN0M0, triple negative -She was diagnosed in  12/2016. She is s/p neoadjuvant chemo, AC-CT, B/l mastectomy and reconstruction surgery, adjuvant radiation and adjuvant Xeloda.  -Her tumor was weakly ER positive on initial biopsy, and became triple negative after neoadjuvant chemotherapy. I think the benefit of antiestrogen therapy is very small and I did not recommend it.  -She was not eligible for clinical trial for adjuvant immunotherapy Keytruda in triple negative cancer -She is clinically doing very well, asymptomatic. Lab reviewed, unremarkable. Physical exam unremarkable today. -It has been 3 years, the risk of occurrence has decreased significantly. -Continue breast cancer surveillance, follow-up in 6 months.   2. Genetics: BRCA1 + -She underwentb/lmastectomy with Dr. Donne Hazel on 07/16/17 and underwent total hysterectomy and BSO with Dr. Denman George on 11/03/17.  -Her children will get tested when they are older.    3. Mood swing, Hot flashes -Secondary  to BSO -On Effexor to 131m, hot flashes and mood swings are manageable.  4. Bone Health  -Given she is s/p BSO I discussed her bone density will start to decrease postmenopausal. Will obtain baseline DEXA in the next few months. -I recommend she start Vit D 1000 units daily.    PLAN:  -DEXA in a month at [facility] -I refilled Effexor today, continue 153mdaily -She is clinically doing well, will continue surveillance -lab andf/u in6m84month No problem-specific Assessment & Plan notes found for this encounter.   Orders Placed This Encounter  Procedures  . DG Bone Density    Standing Status:   Future    Standing Expiration Date:   02/15/2021    Order Specific Question:   Reason for Exam (SYMPTOM  OR DIAGNOSIS REQUIRED)    Answer:   estrogen d    Order Specific Question:   Is the patient pregnant?    Answer:   No    Order Specific Question:   [facility]    [facility]. The patient knows to call the  clinic with any problems, questions or concerns. No barriers to learning was detected. The total time spent in the appointment was 25 minutes.     YanTruitt MerleD 02/16/2020   I, AmoJoslyn Devonm acting as scribe for YanTruitt MerleD.   I have reviewed the above documentation for accuracy and completeness, and I agree with the above.

## 2020-02-16 ENCOUNTER — Inpatient Hospital Stay: Payer: Medicaid Other | Attending: Hematology | Admitting: Hematology

## 2020-02-16 ENCOUNTER — Other Ambulatory Visit: Payer: Self-pay

## 2020-02-16 ENCOUNTER — Inpatient Hospital Stay: Payer: Medicaid Other

## 2020-02-16 ENCOUNTER — Other Ambulatory Visit: Payer: Self-pay | Admitting: Hematology

## 2020-02-16 ENCOUNTER — Encounter: Payer: Self-pay | Admitting: Hematology

## 2020-02-16 ENCOUNTER — Telehealth: Payer: Self-pay | Admitting: Hematology

## 2020-02-16 VITALS — BP 123/83 | HR 70 | Temp 97.7°F | Resp 18 | Ht 64.0 in | Wt 197.5 lb

## 2020-02-16 DIAGNOSIS — Z171 Estrogen receptor negative status [ER-]: Secondary | ICD-10-CM | POA: Diagnosis not present

## 2020-02-16 DIAGNOSIS — K219 Gastro-esophageal reflux disease without esophagitis: Secondary | ICD-10-CM | POA: Insufficient documentation

## 2020-02-16 DIAGNOSIS — R590 Localized enlarged lymph nodes: Secondary | ICD-10-CM | POA: Diagnosis not present

## 2020-02-16 DIAGNOSIS — Z791 Long term (current) use of non-steroidal anti-inflammatories (NSAID): Secondary | ICD-10-CM | POA: Diagnosis not present

## 2020-02-16 DIAGNOSIS — I517 Cardiomegaly: Secondary | ICD-10-CM | POA: Diagnosis not present

## 2020-02-16 DIAGNOSIS — Z1501 Genetic susceptibility to malignant neoplasm of breast: Secondary | ICD-10-CM | POA: Diagnosis not present

## 2020-02-16 DIAGNOSIS — Z170 Estrogen receptor positive status [ER+]: Secondary | ICD-10-CM | POA: Diagnosis not present

## 2020-02-16 DIAGNOSIS — C50412 Malignant neoplasm of upper-outer quadrant of left female breast: Secondary | ICD-10-CM | POA: Insufficient documentation

## 2020-02-16 DIAGNOSIS — Z87440 Personal history of urinary (tract) infections: Secondary | ICD-10-CM | POA: Diagnosis not present

## 2020-02-16 DIAGNOSIS — D696 Thrombocytopenia, unspecified: Secondary | ICD-10-CM | POA: Diagnosis not present

## 2020-02-16 DIAGNOSIS — R232 Flushing: Secondary | ICD-10-CM | POA: Diagnosis not present

## 2020-02-16 DIAGNOSIS — F17200 Nicotine dependence, unspecified, uncomplicated: Secondary | ICD-10-CM | POA: Insufficient documentation

## 2020-02-16 LAB — CBC WITH DIFFERENTIAL (CANCER CENTER ONLY)
Abs Immature Granulocytes: 0.01 10*3/uL (ref 0.00–0.07)
Basophils Absolute: 0 10*3/uL (ref 0.0–0.1)
Basophils Relative: 1 %
Eosinophils Absolute: 0.1 10*3/uL (ref 0.0–0.5)
Eosinophils Relative: 1 %
HCT: 40.3 % (ref 36.0–46.0)
Hemoglobin: 13.6 g/dL (ref 12.0–15.0)
Immature Granulocytes: 0 %
Lymphocytes Relative: 29 %
Lymphs Abs: 1.7 10*3/uL (ref 0.7–4.0)
MCH: 29 pg (ref 26.0–34.0)
MCHC: 33.7 g/dL (ref 30.0–36.0)
MCV: 85.9 fL (ref 80.0–100.0)
Monocytes Absolute: 0.4 10*3/uL (ref 0.1–1.0)
Monocytes Relative: 7 %
Neutro Abs: 3.5 10*3/uL (ref 1.7–7.7)
Neutrophils Relative %: 62 %
Platelet Count: 232 10*3/uL (ref 150–400)
RBC: 4.69 MIL/uL (ref 3.87–5.11)
RDW: 13.3 % (ref 11.5–15.5)
WBC Count: 5.7 10*3/uL (ref 4.0–10.5)
nRBC: 0 % (ref 0.0–0.2)

## 2020-02-16 LAB — CMP (CANCER CENTER ONLY)
ALT: 21 U/L (ref 0–44)
AST: 15 U/L (ref 15–41)
Albumin: 4 g/dL (ref 3.5–5.0)
Alkaline Phosphatase: 78 U/L (ref 38–126)
Anion gap: 8 (ref 5–15)
BUN: 21 mg/dL — ABNORMAL HIGH (ref 6–20)
CO2: 21 mmol/L — ABNORMAL LOW (ref 22–32)
Calcium: 9.1 mg/dL (ref 8.9–10.3)
Chloride: 109 mmol/L (ref 98–111)
Creatinine: 0.83 mg/dL (ref 0.44–1.00)
GFR, Est AFR Am: >60 mL/min (ref >60)
GFR, Estimated: >60 mL/min (ref >60)
Glucose, Bld: 97 mg/dL (ref 70–99)
Potassium: 4 mmol/L (ref 3.5–5.1)
Sodium: 138 mmol/L (ref 135–145)
Total Bilirubin: 0.4 mg/dL (ref 0.3–1.2)
Total Protein: 7.5 g/dL (ref 6.5–8.1)

## 2020-02-16 MED ORDER — VENLAFAXINE HCL ER 150 MG PO CP24: 150.0000 mg | ORAL_CAPSULE | Freq: Every day | ORAL | 5 refills | Status: DC

## 2020-02-16 NOTE — Telephone Encounter (Signed)
Scheduled appointments per 9/9 los. Patient is aware of appointments. Patient declined calendar print out.

## 2020-02-21 ENCOUNTER — Other Ambulatory Visit: Payer: Self-pay | Admitting: Hematology

## 2020-02-21 DIAGNOSIS — E2839 Other primary ovarian failure: Secondary | ICD-10-CM

## 2020-03-01 MED FILL — VENLAFAXINE HCL ER 150 MG C: 150 | 30 days supply | Qty: 30 | Fill #0

## 2020-04-10 ENCOUNTER — Other Ambulatory Visit (HOSPITAL_COMMUNITY): Payer: Self-pay | Admitting: Urology

## 2020-04-10 MED FILL — VENLAFAXINE HCL ER 150 MG C: 150 | 30 days supply | Qty: 30 | Fill #1

## 2020-04-10 MED FILL — NITROFURANTOIN MONO-MCR 100: 100 | 30 days supply | Qty: 30 | Fill #0

## 2020-05-10 MED FILL — VENLAFAXINE HCL ER 150 MG C: 150 | 30 days supply | Qty: 30 | Fill #2

## 2020-05-10 MED FILL — NITROFURANTOIN MONO-MCR 100: 100 | 30 days supply | Qty: 30 | Fill #1

## 2020-06-12 MED FILL — NITROFURANTOIN MONO-MCR 100: 100 | 30 days supply | Qty: 30 | Fill #2

## 2020-06-12 MED FILL — VENLAFAXINE HCL ER 150 MG C: 150 | 30 days supply | Qty: 30 | Fill #3

## 2020-07-20 MED FILL — VENLAFAXINE HCL ER 150 MG C: 150 | 30 days supply | Qty: 30 | Fill #4

## 2020-08-10 NOTE — Progress Notes (Signed)
Kathryn Stanley   [phone] Fax:([phone]) [phone]   Clinic Follow up Note   Patient Care Team: Mosetta Anis, MD as PCP - Joylene Draft, MD as Consulting Physician (General Surgery) [name], MD as Consulting Physician (Hematology) [name], MD as Consulting Physician (Radiation Oncology)  Date of Service:  08/15/2020  CHIEF COMPLAINT: Follow-upleft breast cancer  SUMMARY OF ONCOLOGIC HISTORY: Oncology History Overview Note  Cancer Staging Malignant neoplasm of upper-outer quadrant of left breast in female, estrogen receptor positive ([place]) Staging form: Breast, AJCC 8th Edition - Clinical stage from 12/29/2016: Stage IIIA (cT2, cN1, cM0, G3, ER: Positive, PR: Negative, HER2: Negative) - Signed by [name], MD on 01/07/2017 - Pathologic: No Stage Recommended (ypT1c, pN0, cM0, G2, ER-, PR-, HER2-) - Unsigned Cancer Staging Malignant neoplasm of upper-outer quadrant of left breast in female, estrogen receptor positive ([place]) Staging form: Breast, AJCC 8th Edition - Clinical stage from 12/29/2016: Stage IIIA (cT2, cN1, cM0, G3, ER: Positive, PR: Negative, HER2: Negative) - Signed by [name], MD on 01/07/2017 - Pathologic: No Stage Recommended (ypT1c, pN0, cM0, G2, ER-, PR-, HER2-) - Unsigned     Malignant neoplasm of upper-outer quadrant of left breast in female, estrogen receptor positive ([place])  12/25/2016 Mammogram   [place] and [facility] Diagnostic Breast Tomo Bilateral 12/25/16 IMPRESSION: 1. Suspicious mass 2.3 x 1.6 x 3.0 cm  in the 130 o'clock location of the left breast 8cm from the nipple, biopsy recommended. Adjacent simple cyst is 1.9 cm. 2. Suspicious left axillary lymph node 2.2cm for which biopsy is indicated. 3. Indeterminate group of calcifications in the upper central portion of the left breast for which biopsy is recommended.    12/29/2016 Initial Biopsy   Diagnosis 12/29/16 1. Breast, left, needle core biopsy, stereotactic, upper inner  quadrant - FIBROCYSTIC CHANGES INCLUDING APOCRINE METAPLASIA WITH CALCIFICATIONS - NO MALIGNANCY IDENTIFIED 2. Breast, left, needle core biopsy, ultrasound, 1:30 o'clock - INVASIVE MAMMARY CARCINOMA, G3 - SEE COMMENT 3. Lymph node, needle/core biopsy, ultrasound, left axillary - METASTIC CARCINOMA INVOLVING ONE LYMPH NODE (1/1) E-cadherin is positive supporting a ductal origin.    12/29/2016 Receptors her2   ER 15%+, weak staining  PR - HER2- Ki67 10%    12/29/2016 Initial Diagnosis   Malignant neoplasm of upper-outer quadrant of left breast in female, estrogen receptor positive ([place])   01/13/2017 Echocardiogram   ECHO 01/13/17 Study Conclusions - Left ventricle: The cavity size was normal. Systolic function was   normal. The estimated ejection fraction was in the range of 60%   to 65%. Wall motion was normal; there were no regional wall   motion abnormalities. Left ventricular diastolic function   parameters were normal. - Mitral valve: There was trivial regurgitation. - Right atrium: The atrium was mildly dilated. - Tricuspid valve: There was trivial regurgitation.    01/14/2017 Surgery   Port placement by Dr. Donne Hazel    01/15/2017 Imaging   Whole Body bone Scan 01/15/17 IMPRESSION: 1.  No significant abnormality identified.   01/15/2017 Imaging   CT CAP W Contrast 01/15/17 IMPRESSION: 1. Left lateral breast mass with pathologic left axillary adenopathy including a 2.0 cm left axillary lymph node. 2. Several hypodense lesions in the liver are likely benign lesions such as cysts. However, these are technically too small to characterize by CT. Possibilities for further workup include surveillance or hepatic protocol MRI with and without contrast. 3. There is also a tiny hypodense lesion in the spleen which is technically nonspecific but  statistically highly likely to be benign. 4.  Prominent stool throughout the colon favors constipation   01/16/2017 - 06/05/2017  Chemotherapy   Neoadjuvant chemo AC every 2 weeks for 4 cycles starting on 01/16/2017 and ended 02/27/17. Then proceed with weekly taxol for 12 treatments starting 03/13/17. Added Carboplatin AUC 2 on 04/03/17 to weekly taxol. Due to symptomatic thrombocytopenia we held Carbo since cycle 8. Cycle 9 was post-poned due to neutropenia and Granix was added starting 05/12/17. Added carbo back at reduced dose AUC 1.5 with cycle 10 on 05/22/17. 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   Analysis also detected a Variant of Uncertain Significance (VUS) in the NBN gene called c.628G>T (p.Val210Phe)  Update: The Variant of Uncertain Significance NBN c.628G>T (p.Val210Phe) has been reclassified to "Likely Benign." Report date is 04/09/2018.    06/12/2017 Imaging   MR BREAST BILATERAL WO CONTRAST IMPRESSION: 1. Significant positive response to neoadjuvant chemotherapy. The index carcinoma in the left breast has significantly decreased in size. Left axillary adenopathy has also significantly improved. 2. No other evidence of malignancy.   07/16/2017 Surgery   Pt underwent a bilateral mastectomy and left SLN biopsy with Dr. Donne Hazel   07/16/2017 Pathology Results   Diagnosis 07/16/17 1. Breast, simple mastectomy, Right Prophylactic - FIBROCYSTIC CHANGES INCLUDING APOCRINE METAPLASIA - DUCT ECTASIA - CALCIFICATIONS - NO MALIGNANCY IDENTIFIED 2. Lymph node, sentinel, biopsy, Left Axillary - NO CARCINOMA IDENTIFIED IN ONE LYMPH NODE (0/1) - SEE COMMENT 3. Lymph node, sentinel, biopsy, Left - NO CARCINOMA IDENTIFIED IN ONE LYMPH NODE (0/1) - SEE COMMENT 4. Breast, simple mastectomy, Left - INVASIVE DUCTAL CARCINOMA, NOTTINGHAM GRADE 2 OF 3, 1.3 CM - MARGINS UNINVOLVED BY CARCINOMA (2 CM POSTERIOR MARGIN) - PREVIOUS BIOPSY SITE CHANGES - CALCIFICATIONS - SEE ONCOLOGY TABLE  BELOW   07/16/2017 Receptors her2   Results: IMMUNOHISTOCHEMICAL AND MORPHOMETRIC ANALYSIS PERFORMED MANUALLY Estrogen Receptor: 0%, NEGATIVE Progesterone Receptor: 0%, NEGATIVE Results: HER2 - NEGATIVE RATIO OF HER2/CEP17 SIGNALS 1.73 AVERAGE HER2 COPY NUMBER PER CELL 2.67   09/02/2017 - 10/16/2017 Radiation Therapy   Adjuvant radiation per Dr. Lisbeth Rivers    11/03/2017 Surgery   XI ROBOTIC ASSISTED TOTAL HYSTERECTOMY WITH BILATERAL SALPINGO OOPHORECTOMY  by Dr. Denman George and Dr. Delsa Sale 11/03/17   11/23/2017 - 02/08/2018 Chemotherapy   Adjuvant Xeloda 2053m BID 2 weeks on and 1 week off   06/25/2018 Surgery   Bilateral breast reconstruction with Dr. TIran Planas 06/25/18       CURRENT THERAPY:  Surveillance  INTERVAL HISTORY:  Kathryn SAWDEYis here for a follow up of left breast cancer. She was last seen by me 6 months ago. She presents to the clinic alone. She notes she is doing well. She notes she is trying to lose weight before her [facility] She plans to have surgery in Summer 2022. She is exercising with low carb diet. She notes her hot flashes are manageable and she not longer her mood swings on Effexor. She is on Macribid due to recurrence bladder infection. She is being seen by Dr SFelipa Eth She notes the [facility] not [facility].     REVIEW OF SYSTEMS:   Constitutional: Denies fevers, chills or abnormal weight loss Eyes: Denies blurriness of vision Ears, nose, mouth, throat, and face: Denies mucositis or sore throat Respiratory: Denies cough, dyspnea or wheezes  Cardiovascular: Denies palpitation, chest discomfort or lower extremity swelling Gastrointestinal:  Denies nausea, heartburn or change in bowel habits Skin: Denies abnormal skin rashes Lymphatics: Denies new lymphadenopathy or easy bruising Neurological:Denies numbness, tingling or new weaknesses Behavioral/Psych: Mood is stable, no new changes  All other  systems were reviewed with the patient and are negative.  MEDICAL HISTORY:  Past Medical History:  Diagnosis Date  . Breast cancer, left breast ([place]) dx'd 01/2017  . Complication of anesthesia   . Dermatologic problem    possible psoriasis. Pt has not had been diagnosed by dermatology.  . 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 GERD (gastroesophageal reflux disease)    occational  . PONV (postoperative nausea and vomiting)   . Tobacco dependence    trying to quit with nicotene patches  . UTI (urinary tract infection) 04/24/2017  . Viral upper respiratory infection 03/11/2018    SURGICAL HISTORY: Past Surgical History:  Procedure Laterality Date  . APPLICATION OF A-CELL OF CHEST/ABDOMEN Right 06/25/2018   Procedure: ACELLULAR DERMIS TO RIGHT CHEST;  Surgeon: Irene Limbo, MD;  Location: [place];  Service: Plastics;  Laterality: Right;  . BREAST BIOPSY Left 12/2016  . BREAST IMPLANT EXCHANGE Right 06/24/2019   Procedure: REVISION RIGHT BREAST RECONSTRUCTION WITH SILICONE IMPLANT EXCAHNGE, STRATTICE TO RIGHT CHEST;  Surgeon: Irene Limbo, MD;  Location: [place];  Service: Plastics;  Laterality: Right;  . BREAST RECONSTRUCTION Bilateral 07/16/2017   BILATERAL BREAST RECONSTRUCTION WITH PLACEMENT OF TISSUE EXPANDER AND ALLODERM/notes 07/16/2017  . BREAST RECONSTRUCTION  06/25/2018  . CESAREAN SECTION  2002  . LATISSIMUS FLAP TO BREAST Left 06/25/2018   Procedure: LEFT LATISSIMUS FLAP TO LEFT CHEST;  Surgeon: Irene Limbo, MD;  Location: [place];  Service: Plastics;  Laterality: Left;  . LIPOSUCTION Right 06/24/2019   Procedure: LIPOSUCTION RIGHT CHEST;  Surgeon: Irene Limbo, MD;  Location: [place];  Service: Plastics;  Laterality: Right;  . MASTECTOMY Bilateral 07/16/2017   LEFT SKIN SPARING MASTECTOMY WITH LEFT RADIOACTIVE SEED TARGETED AXILLARY LYMPH NODE EXCISION  AND AXILLARY SENTINEL LYMPH NODE BIOPSY; RIGHT PROPHYLACTIC SKIN SPARING MASTECTOMY [name] 07/16/2017  . MASTECTOMY WITH RADIOACTIVE SEED GUIDED EXCISION AND AXILLARY SENTINEL LYMPH NODE BIOPSY Left 07/16/2017   Procedure: LEFT SKIN SPARING MASTECTOMY WITH LEFT RADIOACTIVE SEED TARGETED AXILLARY LYMPH NODE EXCISION AND AXILLARY SENTINEL LYMPH NODE BIOPSY;  Surgeon: [name], MD;  Location: [place];  Service: General;  Laterality: Left;  . NIPPLE SPARING MASTECTOMY Right 07/16/2017   Procedure: RIGHT PROPHYLACTIC SKIN SPARING MASTECTOMY;  Surgeon: [name], MD;  Location: [place];  Service: General;  Laterality: Right;  . PORT-A-CATH REMOVAL N/A 07/16/2017   Procedure: REMOVAL PORT-A-CATH;  Surgeon: [name], MD;  Location: [place];  Service: General;  Laterality: N/A;  . PORTA CATH REMOVAL  07/16/2017  . PORTACATH PLACEMENT N/A 01/14/2017   Procedure: INSERTION PORT-A-CATH WITH [place];  Surgeon: [name], MD;  Location: [place];  Service: General;  Laterality: N/A;  . REMOVAL OF BILATERAL TISSUE EXPANDERS WITH PLACEMENT OF BILATERAL BREAST IMPLANTS Bilateral 06/25/2018   Procedure: REMOVAL OF BILATERAL TISSUE EXPANDERS WITH PLACEMENT OF BILATERAL SILICONE BREAST IMPLANTS;  Surgeon: Irene Limbo, MD;  Location: [place];  Service: Plastics;  Laterality: Bilateral;  . ROBOTIC ASSISTED TOTAL HYSTERECTOMY WITH BILATERAL SALPINGO OOPHERECTOMY Bilateral 11/03/2017   Procedure: XI ROBOTIC ASSISTED TOTAL HYSTERECTOMY WITH BILATERAL SALPINGO OOPHORECTOMY;  Surgeon: [name], MD;  Location: [facility];  Service: Gynecology;  Laterality: Bilateral;    I have reviewed the social history and family history with the patient and they are unchanged from previous note.  ALLERGIES:  is allergic to amoxicillin, other, chlorhexidine gluconate, and nicotine.  MEDICATIONS:  Current Outpatient Medications  Medication Sig Dispense Refill  . Famotidine (PEPCID PO) Take 1 tablet by mouth daily as  needed (acid reflux).    Marland Kitchen ibuprofen (ADVIL) 800 MG tablet Take 1 tablet (800 mg total) by mouth every 8 (eight) hours as needed for mild pain or moderate pain. 30 tablet 0  . venlafaxine XR (EFFEXOR XR) 150 MG 24 hr capsule Take 1 capsule (150 mg total) by mouth daily with breakfast. 30 capsule 5   No current facility-administered medications for this visit.    PHYSICAL EXAMINATION: ECOG PERFORMANCE STATUS: 0 - Asymptomatic  Vitals:   08/15/20 0848  BP: 115/85  Pulse: 85  Resp: 17  Temp: (!) 97.5 F (36.4 C)  SpO2: 98%   Filed Weights   08/15/20 0848  Weight: 209 lb 1.6 oz (94.8 kg)    GENERAL:alert, no distress and comfortable SKIN: skin color, texture, turgor are normal, no rashes or significant lesions EYES: normal, Conjunctiva are pink and non-injected, sclera clear  NECK: supple, thyroid normal size, non-tender, without nodularity LYMPH:  no palpable lymphadenopathy in the cervical, axillary  LUNGS: clear to auscultation and percussion with normal breathing effort HEART: regular rate & rhythm and no murmurs and no lower extremity edema ABDOMEN:abdomen soft, non-tender and normal bowel sounds Musculoskeletal:no cyanosis of digits and no clubbing  NEURO: alert & oriented x 3 with fluent speech, no focal motor/sensory deficits BREAST: S/p b/l mastectomy: surgical incision healed well with mild scar tissue. No palpable mass, nodules or adenopathy bilaterally. Breast exam benign.   LABORATORY DATA:  I have reviewed the data as listed CBC Latest Ref Rng & Units 08/15/2020 02/16/2020 08/11/2019  WBC 4.0 - 10.5 K/uL 5.9 5.7 5.5  Hemoglobin 12.0 - 15.0 g/dL 13.9 13.6 14.1  Hematocrit 36.0 - 46.0 % 41.1 40.3 41.4  Platelets 150 - 400 K/uL 229 232 240     CMP Latest Ref Rng & Units 08/15/2020 02/16/2020 08/11/2019  Glucose 70 - 99 mg/dL 98 97 104(H)  BUN 6 - 20 mg/dL 30(H) 21(H) 21(H)  Creatinine 0.44 - 1.00 mg/dL 1.11(H) 0.83 0.82  Sodium 135 - 145 mmol/L 141 138 139  Potassium 3.5  - 5.1 mmol/L 3.8 4.0 4.4  Chloride 98 - 111 mmol/L 106 109 106  CO2 22 - 32 mmol/L 22 21(L) 25  Calcium 8.9 - 10.3 mg/dL 9.2 9.1 9.0  Total Protein 6.5 - 8.1 g/dL 7.9 7.5 7.4  Total Bilirubin 0.3 - 1.2 mg/dL 0.2(L) 0.4 0.2(L)  Alkaline Phos 38 - 126 U/L 82 78 91  AST 15 - 41 U/L 17 15 16   ALT 0 - 44 U/L 29 21 28       RADIOGRAPHIC STUDIES: I have personally reviewed the radiological images as listed and agreed with the findings in the report. No results found.   ASSESSMENT & PLAN:  Kathryn Stanley is a 45 y.o. female with    1. Malignant neoplasm of upper-outer quadrant of left breast in female, ductal carcinoma, cT2N1M0, Stage IIIa, ER weakly15%+, PR - , HER2 - , Grade 3, ypT1cN0M0, triple negative -She was diagnosed in 12/2016. She is s/p neoadjuvant chemo, AC-CT, B/l mastectomy and reconstruction surgery, adjuvant radiation and adjuvant Xeloda.  -Her tumor was weakly ER positive on initial  biopsy, and became triple negative after neoadjuvant chemotherapy. I think the benefit of antiestrogen therapy is very small and I did not recommend it.  -She was not eligible for clinical trial for adjuvant immunotherapy Keytruda in triple negative cancer -She is clinically doing well. Labs reviewed, CBC and CMP WNL. Physical exam unremarkable. There is no clinical concern for recurrence.  -She plans to proceed with diet flap breast reconstruction surgery in Summer 2022 at [place] [facility]. She is s/p b/l mastectomy, she does not require mammograms -It has been 3.5 years since her cancer diagnosis. Her risk of recurrence has decreased significantly.  -F/u in 6 months then yearly.    2. Genetics: BRCA1 + -She underwentb/lmastectomy with Dr. Donne Hazel on 07/16/17 and underwent total hysterectomy and BSO with Dr. Denman George on 11/03/17.  -Her children will get tested when they are older.   3. Hot flashes -Secondary to BSO -On Effexor to 110m, hot flashes are manageable.  4. Bone  Health  -Given she is s/p BSO I discussed her bone density will start to decrease postmenopausal.  -She was not able to proceed with baseline DEXA as [facility] not [facility]. I will look into other locations.  -I recommend she start Vit D 1000 units daily.  5. Weight management  -She is trying to lose weight before her [facility] She plans to have surgery in Summer 2022. She is exercising with low carb diet.  -I suggest [facility]. She is interested, I will refer her.    PLAN: -Refer to [facility] -Lab and f/u in 6 months    No problem-specific Assessment & Plan notes found for this encounter.   Orders Placed This Encounter  Procedures  . Amb Ref to Medical Weight Management    Referral Priority:   Routine    Referral Type:   Consultation    Number of Visits Requested:   1   All questions were answered. The patient knows to call the clinic with any problems, questions or concerns. No barriers to learning was detected. The total time spent in the appointment was 25 minutes.     YTruitt Merle MD 08/15/2020   I, AJoslyn Devon am acting as scribe for YTruitt Merle MD.   I have reviewed the above documentation for accuracy and completeness, and I agree with the above.

## 2020-08-13 ENCOUNTER — Other Ambulatory Visit (HOSPITAL_COMMUNITY): Payer: Self-pay | Admitting: Physician Assistant

## 2020-08-13 MED FILL — NITROFURANTOIN MONO-MCR 100: 100 | 10 days supply | Qty: 20 | Fill #0

## 2020-08-15 ENCOUNTER — Encounter: Payer: Self-pay | Admitting: Hematology

## 2020-08-15 ENCOUNTER — Inpatient Hospital Stay: Payer: Medicaid Other | Attending: Hematology

## 2020-08-15 ENCOUNTER — Inpatient Hospital Stay (HOSPITAL_BASED_OUTPATIENT_CLINIC_OR_DEPARTMENT_OTHER): Payer: Medicaid Other | Admitting: Hematology

## 2020-08-15 ENCOUNTER — Other Ambulatory Visit: Payer: Self-pay

## 2020-08-15 ENCOUNTER — Telehealth: Payer: Self-pay | Admitting: Hematology

## 2020-08-15 VITALS — BP 115/85 | HR 85 | Temp 97.5°F | Resp 17 | Wt 209.1 lb

## 2020-08-15 DIAGNOSIS — Z791 Long term (current) use of non-steroidal anti-inflammatories (NSAID): Secondary | ICD-10-CM | POA: Diagnosis not present

## 2020-08-15 DIAGNOSIS — R232 Flushing: Secondary | ICD-10-CM | POA: Diagnosis not present

## 2020-08-15 DIAGNOSIS — Z1509 Genetic susceptibility to other malignant neoplasm: Secondary | ICD-10-CM

## 2020-08-15 DIAGNOSIS — Z170 Estrogen receptor positive status [ER+]: Secondary | ICD-10-CM | POA: Diagnosis not present

## 2020-08-15 DIAGNOSIS — C50412 Malignant neoplasm of upper-outer quadrant of left female breast: Secondary | ICD-10-CM | POA: Diagnosis present

## 2020-08-15 DIAGNOSIS — Z9013 Acquired absence of bilateral breasts and nipples: Secondary | ICD-10-CM | POA: Diagnosis not present

## 2020-08-15 DIAGNOSIS — I517 Cardiomegaly: Secondary | ICD-10-CM | POA: Diagnosis not present

## 2020-08-15 DIAGNOSIS — K219 Gastro-esophageal reflux disease without esophagitis: Secondary | ICD-10-CM | POA: Insufficient documentation

## 2020-08-15 DIAGNOSIS — Z1501 Genetic susceptibility to malignant neoplasm of breast: Secondary | ICD-10-CM

## 2020-08-15 LAB — CBC WITH DIFFERENTIAL (CANCER CENTER ONLY)
Abs Immature Granulocytes: 0.01 10*3/uL (ref 0.00–0.07)
Basophils Absolute: 0.1 10*3/uL (ref 0.0–0.1)
Basophils Relative: 1 %
Eosinophils Absolute: 0.1 10*3/uL (ref 0.0–0.5)
Eosinophils Relative: 2 %
HCT: 41.1 % (ref 36.0–46.0)
Hemoglobin: 13.9 g/dL (ref 12.0–15.0)
Immature Granulocytes: 0 %
Lymphocytes Relative: 28 %
Lymphs Abs: 1.7 10*3/uL (ref 0.7–4.0)
MCH: 28.3 pg (ref 26.0–34.0)
MCHC: 33.8 g/dL (ref 30.0–36.0)
MCV: 83.5 fL (ref 80.0–100.0)
Monocytes Absolute: 0.5 10*3/uL (ref 0.1–1.0)
Monocytes Relative: 8 %
Neutro Abs: 3.6 10*3/uL (ref 1.7–7.7)
Neutrophils Relative %: 61 %
Platelet Count: 229 10*3/uL (ref 150–400)
RBC: 4.92 MIL/uL (ref 3.87–5.11)
RDW: 12.5 % (ref 11.5–15.5)
WBC Count: 5.9 10*3/uL (ref 4.0–10.5)
nRBC: 0 % (ref 0.0–0.2)

## 2020-08-15 LAB — CMP (CANCER CENTER ONLY)
ALT: 29 U/L (ref 0–44)
AST: 17 U/L (ref 15–41)
Albumin: 4.2 g/dL (ref 3.5–5.0)
Alkaline Phosphatase: 82 U/L (ref 38–126)
Anion gap: 13 (ref 5–15)
BUN: 30 mg/dL — ABNORMAL HIGH (ref 6–20)
CO2: 22 mmol/L (ref 22–32)
Calcium: 9.2 mg/dL (ref 8.9–10.3)
Chloride: 106 mmol/L (ref 98–111)
Creatinine: 1.11 mg/dL — ABNORMAL HIGH (ref 0.44–1.00)
GFR, Estimated: >60 mL/min (ref >60)
Glucose, Bld: 98 mg/dL (ref 70–99)
Potassium: 3.8 mmol/L (ref 3.5–5.1)
Sodium: 141 mmol/L (ref 135–145)
Total Bilirubin: 0.2 mg/dL — ABNORMAL LOW (ref 0.3–1.2)
Total Protein: 7.9 g/dL (ref 6.5–8.1)

## 2020-08-15 NOTE — Telephone Encounter (Signed)
Scheduled appointments per 3/9 los. Spoke to patient who is aware of appointments date and times.

## 2020-10-01 ENCOUNTER — Encounter: Payer: Medicaid Other | Admitting: Internal Medicine

## 2020-10-01 ENCOUNTER — Other Ambulatory Visit: Payer: Self-pay | Admitting: Hematology

## 2020-10-02 ENCOUNTER — Other Ambulatory Visit (HOSPITAL_COMMUNITY): Payer: Self-pay

## 2020-10-02 MED ORDER — VENLAFAXINE HCL ER 150 MG PO CP24
ORAL_CAPSULE | Freq: Every day | ORAL | 5 refills | Status: DC
  Filled 2020-10-02: qty 30, 30d supply, fill #0
  Filled 2020-11-01: qty 30, 30d supply, fill #1
  Filled 2020-12-06: qty 30, 30d supply, fill #2
  Filled 2021-01-11: qty 30, 30d supply, fill #3
  Filled 2021-02-22: qty 30, 30d supply, fill #4
  Filled 2021-04-03: qty 30, 30d supply, fill #5

## 2020-10-04 ENCOUNTER — Encounter: Payer: Medicaid Other | Admitting: Internal Medicine

## 2020-10-22 ENCOUNTER — Encounter (HOSPITAL_COMMUNITY): Payer: Self-pay

## 2020-10-22 ENCOUNTER — Emergency Department (HOSPITAL_COMMUNITY)
Admission: EM | Admit: 2020-10-22 | Discharge: 2020-10-22 | Disposition: A | Discharge status: Home / Self Care | Payer: Medicaid Other | Attending: Emergency Medicine | Admitting: Emergency Medicine

## 2020-10-22 ENCOUNTER — Other Ambulatory Visit: Payer: Self-pay

## 2020-10-22 DIAGNOSIS — Z20822 Contact with and (suspected) exposure to covid-19: Secondary | ICD-10-CM | POA: Diagnosis not present

## 2020-10-22 DIAGNOSIS — Z87891 Personal history of nicotine dependence: Secondary | ICD-10-CM | POA: Insufficient documentation

## 2020-10-22 DIAGNOSIS — R509 Fever, unspecified: Secondary | ICD-10-CM | POA: Diagnosis not present

## 2020-10-22 DIAGNOSIS — Z853 Personal history of malignant neoplasm of breast: Secondary | ICD-10-CM | POA: Insufficient documentation

## 2020-10-22 NOTE — ED Triage Notes (Signed)
Pt here for covid test, daughter tested positive today and pt unable to find anywhere to get a test to be able to return to work. Pt denies any s/s

## 2020-10-22 NOTE — Discharge Instructions (Signed)
You have been tested for COVID-19.  You may check the result through MyChart, link below which will result in the next 24 hours.  If test positive, follow instruction below.  Recommendations for at home COVID-19 symptoms management:  Please continue isolation at home. Call [phone] to see whether you might be eligible for therapeutic antibody infusions (leave your name and they will call you back).  If have acute worsening of symptoms please go to ER/urgent care for further evaluation. Check pulse oximetry and if below 90-92% please go to ER. The following supplements MAY help:  Vitamin C 524m twice a day and Quercetin 250-500 mg twice a day Vitamin D3 2000 - 4000 u/day B Complex vitamins Zinc 75-100 mg/day Melatonin 6-10 mg at night (the optimal dose is unknown) Aspirin 827mday (if no history of bleeding issues)

## 2020-10-22 NOTE — ED Provider Notes (Signed)
[facility] DEPT Provider Note   CSN: [id number] Arrival date & time: 10/22/20  1708     History No chief complaint on file.   Kathryn Stanley is a 45 y.o. female.  The history is provided by the patient. No language interpreter was used.     45 year old female with history of tobacco use and previously had breast cancer presenting with concern of positive COVID and exposure.  Patient states her daughter developed cold symptoms and fever this morning and did test positive for COVID infection.  Aside from some mild runny nose, patient denies having an active symptoms.  No fever headache chest pain cough abdominal pain or shortness of breath she is here per request of her workplace to have a COVID test to determine if she can return to work  Past Medical History:  Diagnosis Date  . Breast cancer, left breast ([place]) dx'd 01/2017  . Complication of anesthesia   . Dermatologic problem    possible psoriasis. Pt has not had been diagnosed by dermatology.  . 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 GERD (gastroesophageal reflux disease)    occational  . PONV (postoperative nausea and vomiting)   . Tobacco dependence    trying to quit with nicotene patches  . UTI (urinary tract infection) 04/24/2017  . Viral upper respiratory infection 03/11/2018    Patient Active Problem List   Diagnosis Date Noted  . Urinary urgency 01/04/2020  . S/P hysterectomy with oophorectomy 01/04/2020  . Mixed hyperlipidemia 01/04/2020  . Menopausal symptoms 02/01/2019  . Obesity (BMI 35.0-39.9 without comorbidity) 02/01/2019  . History of breast cancer 06/25/2018  . 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  . Genetic testing 03/05/2017  . Port catheter in place 01/30/2017  . Malignant neoplasm of upper-outer quadrant of left breast in female, estrogen receptor positive ([place]) 01/05/2017  . Tobacco use  12/12/2016    Past Surgical History:  Procedure Laterality Date  . APPLICATION OF A-CELL OF CHEST/ABDOMEN Right 06/25/2018   Procedure: ACELLULAR DERMIS TO RIGHT CHEST;  Surgeon: Irene Limbo, MD;  Location: [place];  Service: Plastics;  Laterality: Right;  . BREAST BIOPSY Left 12/2016  . BREAST IMPLANT EXCHANGE Right 06/24/2019   Procedure: REVISION RIGHT BREAST RECONSTRUCTION WITH SILICONE IMPLANT EXCAHNGE, STRATTICE TO RIGHT CHEST;  Surgeon: Irene Limbo, MD;  Location: [place];  Service: Plastics;  Laterality: Right;  . BREAST RECONSTRUCTION Bilateral 07/16/2017   BILATERAL BREAST RECONSTRUCTION WITH PLACEMENT OF TISSUE EXPANDER AND ALLODERM/notes 07/16/2017  . BREAST RECONSTRUCTION  06/25/2018  . CESAREAN SECTION  2002  . LATISSIMUS FLAP TO BREAST Left 06/25/2018   Procedure: LEFT LATISSIMUS FLAP TO LEFT CHEST;  Surgeon: Irene Limbo, MD;  Location: [place];  Service: Plastics;  Laterality: Left;  . LIPOSUCTION Right 06/24/2019   Procedure: LIPOSUCTION RIGHT CHEST;  Surgeon: Irene Limbo, MD;  Location: [place];  Service: Plastics;  Laterality: Right;  . MASTECTOMY Bilateral 07/16/2017   LEFT SKIN SPARING MASTECTOMY WITH LEFT RADIOACTIVE SEED TARGETED AXILLARY LYMPH NODE EXCISION AND AXILLARY SENTINEL LYMPH NODE BIOPSY; RIGHT PROPHYLACTIC SKIN SPARING MASTECTOMY [name] 07/16/2017  . MASTECTOMY WITH RADIOACTIVE SEED GUIDED EXCISION AND AXILLARY SENTINEL LYMPH NODE BIOPSY Left 07/16/2017   Procedure: LEFT SKIN SPARING MASTECTOMY WITH LEFT RADIOACTIVE SEED TARGETED AXILLARY LYMPH NODE EXCISION AND AXILLARY SENTINEL LYMPH NODE BIOPSY;  Surgeon: [name], MD;  Location: [place];  Service: General;  Laterality: Left;  .  NIPPLE SPARING MASTECTOMY Right 07/16/2017   Procedure: RIGHT PROPHYLACTIC SKIN SPARING MASTECTOMY;  Surgeon: [name], MD;  Location: [place];  Service: General;  Laterality: Right;  . PORT-A-CATH REMOVAL N/A 07/16/2017    Procedure: REMOVAL PORT-A-CATH;  Surgeon: [name], MD;  Location: [place];  Service: General;  Laterality: N/A;  . PORTA CATH REMOVAL  07/16/2017  . PORTACATH PLACEMENT N/A 01/14/2017   Procedure: INSERTION PORT-A-CATH WITH [place];  Surgeon: [name], MD;  Location: [place];  Service: General;  Laterality: N/A;  . REMOVAL OF BILATERAL TISSUE EXPANDERS WITH PLACEMENT OF BILATERAL BREAST IMPLANTS Bilateral 06/25/2018   Procedure: REMOVAL OF BILATERAL TISSUE EXPANDERS WITH PLACEMENT OF BILATERAL SILICONE BREAST IMPLANTS;  Surgeon: Irene Limbo, MD;  Location: [place];  Service: Plastics;  Laterality: Bilateral;  . ROBOTIC ASSISTED TOTAL HYSTERECTOMY WITH BILATERAL SALPINGO OOPHERECTOMY Bilateral 11/03/2017   Procedure: XI ROBOTIC ASSISTED TOTAL HYSTERECTOMY WITH BILATERAL SALPINGO OOPHORECTOMY;  Surgeon: [name], MD;  Location: [facility];  Service: Gynecology;  Laterality: Bilateral;     OB History    Gravida  1   Para      Term      Preterm      AB      Living  2     SAB      IAB      Ectopic      Multiple  1   Live Births  2           Family History  Problem Relation Age of Onset  . Stroke Maternal Grandmother   . Stroke Father   . Hypertension Father   . Other Father        Adopted    Social History   Tobacco Use  . Smoking status: Former Smoker    Packs/day: 1.00    Years: 17.00    Pack years: 17.00    Types: Cigarettes    Quit date: 12/15/2016    Years since quitting: 3.8  . Smokeless tobacco: Never Used  Vaping Use  . Vaping Use: Never used  Substance Use Topics  . Alcohol use: No  . Drug use: No    Home Medications Prior to Admission medications   Medication Sig Start Date End Date Taking? Authorizing Provider  Famotidine (PEPCID PO) Take 1 tablet by mouth daily as needed (acid reflux).    [provider]  ibuprofen (ADVIL) 800 MG tablet Take 1 tablet (800 mg total) by mouth every 8 (eight) hours as needed for mild pain or  moderate pain. 06/24/19   Irene Limbo, MD  nitrofurantoin, macrocrystal-monohydrate, (MACROBID) 100 MG capsule TAKE 1 CAPSULE BY MOUTH 2 TIMES DAILY FOR 10 DAYS 08/13/20 08/13/21  Julaine Hua, PA-C  nitrofurantoin, macrocrystal-monohydrate, (MACROBID) 100 MG capsule TAKE 1 CAPSULE BY MOUTH ONCE DAILY 04/10/20 04/10/21  Stoneking, Reece Leader., MD  venlafaxine XR (EFFEXOR-XR) 150 MG 24 hr capsule TAKE 1 CAPSULE BY MOUTH DAILY WITH BREAKFAST. 10/02/20 10/02/21  [name], MD    Allergies    Amoxicillin, Other, Chlorhexidine gluconate, and Nicotine  Review of Systems   Review of Systems  All other systems reviewed and are negative.   Physical Exam Updated Vital Signs LMP 01/07/2017 (Approximate)   Physical Exam Vitals and nursing note reviewed.  Constitutional:      General: She is not in acute distress.    Appearance: She is well-developed.  HENT:     Head: Atraumatic.  Eyes:     Conjunctiva/sclera: Conjunctivae normal.  Cardiovascular:     Rate and Rhythm: Normal rate and regular rhythm.     Pulses: Normal pulses.     Heart sounds: Normal heart sounds.  Pulmonary:     Effort: Pulmonary effort is normal.     Breath sounds: Normal breath sounds. No wheezing, rhonchi or rales.  Abdominal:     Palpations: Abdomen is soft.  Musculoskeletal:     Cervical back: Neck supple.  Skin:    Findings: No rash.  Neurological:     Mental Status: She is alert.     ED Results / Procedures / Treatments   Labs (all labs ordered are listed, but only abnormal results are displayed) Labs Reviewed  SARS CORONAVIRUS 2 (TAT 6-24 HRS)    EKG None  Radiology No results found.  Procedures Procedures   Medications Ordered in ED Medications - No data to display  ED Course  I have reviewed the triage vital signs and the nursing notes.  Pertinent labs & imaging results that were available during my care of the patient were reviewed by me and considered in my medical decision making  (see chart for details).    MDM Rules/Calculators/A&P                          BP (!) 126/96 (BP Location: Left Arm)   Pulse 91   Temp 97.8 F (36.6 C) (Oral)   Resp 16   Ht 5' 4"  (1.626 m)   Wt 95 kg   LMP 01/07/2017 (Approximate)   SpO2 97%   BMI 35.95 kg/m   Final Clinical Impression(s) / ED Diagnoses Final diagnoses:  Close exposure to COVID-19 virus    Rx / DC Orders ED Discharge Orders    None     Recent COVID exposure with no significant symptoms.  COVID test ordered.  Patient stable for discharge.  Can follow-up the results through [facility] in the next 6 to 18 hours.  Work note provided.  Return precaution given.   Cameren Odwyer Santarelli was evaluated in Emergency Department on 10/22/2020 for the symptoms described in the history of present illness. She was evaluated in the context of the global COVID-19 pandemic, which necessitated consideration that the patient might be at risk for infection with the SARS-CoV-2 virus that causes COVID-19. Institutional protocols and algorithms that pertain to the evaluation of patients at risk for COVID-19 are in a state of rapid change based on information released by regulatory bodies including the CDC and federal and state organizations. These policies and algorithms were followed during the patient's care in the ED.     Domenic Moras, PA-C 10/22/20 [facility]    Lacretia Leigh, MD 10/24/20 [phone]

## 2020-10-23 LAB — SARS CORONAVIRUS 2 (TAT 6-24 HRS): SARS Coronavirus 2: NEGATIVE

## 2020-10-24 ENCOUNTER — Encounter: Payer: Self-pay | Admitting: Internal Medicine

## 2020-10-24 ENCOUNTER — Telehealth: Payer: Self-pay | Admitting: Internal Medicine

## 2020-10-24 ENCOUNTER — Other Ambulatory Visit (HOSPITAL_COMMUNITY): Payer: Self-pay

## 2020-10-24 DIAGNOSIS — U071 COVID-19: Secondary | ICD-10-CM

## 2020-10-24 MED ORDER — NIRMATRELVIR/RITONAVIR (PAXLOVID)TABLET
3.0000 | ORAL_TABLET | Freq: Two times a day (BID) | ORAL | 0 refills | Status: AC
  Filled 2020-10-24: qty 30, 5d supply, fill #0

## 2020-10-24 NOTE — Telephone Encounter (Signed)
I connected with  Caroline Longie Foronda on 10/24/20 by a video enabled telemedicine application and verified that I am speaking with the correct person using two identifiers.   I discussed the limitations of evaluation and management by telemedicine. The patient expressed understanding and agreed to proceed.  Patient tested positive for COVID yesterday.  Her husband and 2 daughters have also tested positive.  Patient has been vaccinated against COVID last year.  BMI 35 , hld, and history of tobacco use.  She has been experiencing muscle aches, congestion and fever to 102 F.  Given her comorbidities she is at risk for developing severe COVID-19.  Currently Effexor is patient's only daily medication.  She does take ibuprofen periodically and famotidine.  These medications were ran into lexicon database against Paxlovid without interactions.  She has normal kidney function  Assessment/Plan:  COVID-19, mild with risk of developing moderate to severe Covid-19  - nirmatrelvir/ritonavir EUA (PAXLOVID) TABS; Take 3 tablets by mouth 2 (two) times daily for 5 days.  Dispense: 30 tablet; Refill: 0

## 2020-10-25 ENCOUNTER — Other Ambulatory Visit (HOSPITAL_COMMUNITY): Payer: Self-pay

## 2020-10-27 ENCOUNTER — Encounter: Payer: Self-pay | Admitting: *Deleted

## 2020-11-01 ENCOUNTER — Other Ambulatory Visit (HOSPITAL_COMMUNITY): Payer: Self-pay

## 2020-11-01 ENCOUNTER — Encounter: Payer: Medicaid Other | Admitting: Student

## 2020-11-06 ENCOUNTER — Encounter: Payer: Medicaid Other | Admitting: Student

## 2020-12-06 ENCOUNTER — Other Ambulatory Visit (HOSPITAL_COMMUNITY): Payer: Self-pay

## 2020-12-07 ENCOUNTER — Encounter: Payer: Self-pay | Admitting: Hematology

## 2020-12-11 ENCOUNTER — Encounter: Payer: Self-pay | Admitting: *Deleted

## 2021-01-11 ENCOUNTER — Other Ambulatory Visit (HOSPITAL_COMMUNITY): Payer: Self-pay

## 2021-02-18 ENCOUNTER — Other Ambulatory Visit: Payer: Medicaid Other

## 2021-02-18 ENCOUNTER — Ambulatory Visit: Payer: Medicaid Other | Admitting: Hematology

## 2021-02-18 DIAGNOSIS — C50412 Malignant neoplasm of upper-outer quadrant of left female breast: Secondary | ICD-10-CM

## 2021-02-18 DIAGNOSIS — Z1501 Genetic susceptibility to malignant neoplasm of breast: Secondary | ICD-10-CM

## 2021-02-22 ENCOUNTER — Other Ambulatory Visit (HOSPITAL_COMMUNITY): Payer: Self-pay

## 2021-03-13 ENCOUNTER — Encounter: Payer: Self-pay | Admitting: Genetic Counselor

## 2021-03-13 DIAGNOSIS — Z1509 Genetic susceptibility to other malignant neoplasm: Secondary | ICD-10-CM | POA: Insufficient documentation

## 2021-04-03 ENCOUNTER — Other Ambulatory Visit (HOSPITAL_COMMUNITY): Payer: Self-pay

## 2021-05-06 ENCOUNTER — Other Ambulatory Visit: Payer: Self-pay | Admitting: Hematology

## 2021-05-06 ENCOUNTER — Other Ambulatory Visit (HOSPITAL_COMMUNITY): Payer: Self-pay

## 2021-05-06 MED ORDER — VENLAFAXINE HCL ER 150 MG PO CP24
ORAL_CAPSULE | Freq: Every day | ORAL | 5 refills | Status: DC
  Filled 2021-05-06: qty 30, 30d supply, fill #0
  Filled 2021-06-11: qty 30, 30d supply, fill #1
  Filled 2021-07-09: qty 30, 30d supply, fill #2
  Filled 2021-08-08: qty 30, 30d supply, fill #3
  Filled 2021-09-08: qty 30, 30d supply, fill #4
  Filled 2021-10-10: qty 30, 30d supply, fill #5

## 2021-06-07 ENCOUNTER — Encounter (HOSPITAL_COMMUNITY): Payer: Self-pay

## 2021-06-07 ENCOUNTER — Ambulatory Visit (HOSPITAL_COMMUNITY)
Admission: EM | Admit: 2021-06-07 | Discharge: 2021-06-07 | Disposition: A | Discharge status: Home / Self Care | Payer: Medicaid Other | Attending: Internal Medicine | Admitting: Internal Medicine

## 2021-06-07 ENCOUNTER — Other Ambulatory Visit: Payer: Self-pay

## 2021-06-07 DIAGNOSIS — J040 Acute laryngitis: Secondary | ICD-10-CM | POA: Diagnosis not present

## 2021-06-07 DIAGNOSIS — Z87891 Personal history of nicotine dependence: Secondary | ICD-10-CM | POA: Diagnosis not present

## 2021-06-07 DIAGNOSIS — B9789 Other viral agents as the cause of diseases classified elsewhere: Secondary | ICD-10-CM | POA: Diagnosis not present

## 2021-06-07 DIAGNOSIS — Z20822 Contact with and (suspected) exposure to covid-19: Secondary | ICD-10-CM | POA: Diagnosis not present

## 2021-06-07 DIAGNOSIS — J029 Acute pharyngitis, unspecified: Secondary | ICD-10-CM | POA: Diagnosis present

## 2021-06-07 LAB — POCT RAPID STREP A, ED / UC: Streptococcus, Group A Screen (Direct): NEGATIVE

## 2021-06-07 MED ORDER — PROMETHAZINE-DM 6.25-15 MG/5ML PO SYRP: 5.0000 mL | ORAL_SOLUTION | Freq: Four times a day (QID) | ORAL | 0 refills | Status: DC | PRN

## 2021-06-07 NOTE — Discharge Instructions (Addendum)
Voice rest Increase oral fluid intake Take medications as prescribed Hoarseness of voice is likely viral and it is transient.

## 2021-06-07 NOTE — ED Triage Notes (Signed)
Pt presents with c/o loss of voice, sore throat, body aches X 2 days.   States she has been around others who have Laryngitis.

## 2021-06-07 NOTE — ED Provider Notes (Signed)
[facility]    CSN: [id number] Arrival date & time: 06/07/21  1425      History   Chief Complaint Chief Complaint  Patient presents with   Sore Throat   Generalized Body Aches   Laryngitis    HPI Kathryn Stanley is a 45 y.o. female comes to the urgent care with 1 day history of generalized body aches, sore throat and hoarseness of voice.  Patient's symptoms started yesterday and has been persistent.  He started with a sore throat, and generalized body aches and then eventually patient started to lose her voice today.  She denies any shortness of breath.  She has a cough which is not productive.  No wheezing or chest tightness.  Patient endorses a coworker with similar symptoms.  She is vaccinated against COVID-19 virus.   HPI  Past Medical History:  Diagnosis Date   Breast cancer, left breast ([place]) dx'd 02/7947   Complication of anesthesia    Dermatologic problem    possible psoriasis. Pt has not had been diagnosed by dermatology.   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   Urinary urgency 01/04/2020   S/P hysterectomy with oophorectomy 01/04/2020   Mixed hyperlipidemia 01/04/2020   Menopausal symptoms 02/01/2019   Obesity (BMI 35.0-39.9 without comorbidity) 02/01/2019   History of breast cancer 06/25/2018   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   Genetic testing 03/05/2017   Port catheter in place 01/30/2017   Malignant neoplasm of upper-outer quadrant of left breast in female, estrogen receptor positive ([place]) 01/05/2017   Tobacco use  12/12/2016    Past Surgical History:  Procedure Laterality Date   APPLICATION OF A-CELL OF CHEST/ABDOMEN Right 06/25/2018   Procedure: ACELLULAR DERMIS TO RIGHT CHEST;  Surgeon: Irene Limbo, MD;  Location: [facility];  Service: Plastics;  Laterality: Right;   BREAST BIOPSY Left 12/2016   BREAST IMPLANT EXCHANGE Right 06/24/2019   Procedure: REVISION RIGHT BREAST RECONSTRUCTION WITH SILICONE IMPLANT EXCAHNGE, STRATTICE TO RIGHT CHEST;  Surgeon: Irene Limbo, MD;  Location: [place];  Service: Plastics;  Laterality: Right;   BREAST RECONSTRUCTION Bilateral 07/16/2017   BILATERAL BREAST RECONSTRUCTION WITH PLACEMENT OF TISSUE EXPANDER AND ALLODERM/notes 07/16/2017   BREAST RECONSTRUCTION  06/25/2018   CESAREAN SECTION  2002   LATISSIMUS FLAP TO BREAST Left 06/25/2018   Procedure: LEFT LATISSIMUS FLAP TO LEFT CHEST;  Surgeon: Irene Limbo, MD;  Location: [place];  Service: Plastics;  Laterality: Left;   LIPOSUCTION Right 06/24/2019   Procedure: LIPOSUCTION RIGHT CHEST;  Surgeon: Irene Limbo, MD;  Location: [place];  Service: Plastics;  Laterality: Right;   MASTECTOMY Bilateral 07/16/2017   LEFT SKIN SPARING MASTECTOMY WITH LEFT RADIOACTIVE SEED TARGETED AXILLARY LYMPH NODE EXCISION AND AXILLARY SENTINEL LYMPH NODE BIOPSY; RIGHT PROPHYLACTIC SKIN SPARING MASTECTOMY [name] 07/16/2017   MASTECTOMY WITH RADIOACTIVE SEED GUIDED EXCISION AND AXILLARY SENTINEL LYMPH NODE BIOPSY Left 07/16/2017   Procedure: LEFT SKIN SPARING MASTECTOMY WITH LEFT RADIOACTIVE SEED TARGETED AXILLARY LYMPH NODE EXCISION AND AXILLARY SENTINEL LYMPH NODE BIOPSY;  Surgeon: Donne Hazel,  Rodman Key, MD;  Location: [place];  Service: General;  Laterality: Left;   NIPPLE SPARING MASTECTOMY Right 07/16/2017   Procedure: RIGHT PROPHYLACTIC SKIN SPARING MASTECTOMY;  Surgeon: [name], MD;  Location: [place];  Service: General;  Laterality: Right;   PORT-A-CATH REMOVAL N/A 07/16/2017   Procedure:  REMOVAL PORT-A-CATH;  Surgeon: [name], MD;  Location: [facility] [place];  Service: General;  Laterality: N/A;   PORTA CATH REMOVAL  07/16/2017   PORTACATH PLACEMENT N/A 01/14/2017   Procedure: INSERTION PORT-A-CATH WITH [place];  Surgeon: [name], MD;  Location: [facility];  Service: General;  Laterality: N/A;   REMOVAL OF BILATERAL TISSUE EXPANDERS WITH PLACEMENT OF BILATERAL BREAST IMPLANTS Bilateral 06/25/2018   Procedure: REMOVAL OF BILATERAL TISSUE EXPANDERS WITH PLACEMENT OF BILATERAL SILICONE BREAST IMPLANTS;  Surgeon: Irene Limbo, MD;  Location: [place];  Service: Plastics;  Laterality: Bilateral;   ROBOTIC ASSISTED TOTAL HYSTERECTOMY WITH BILATERAL SALPINGO OOPHERECTOMY Bilateral 11/03/2017   Procedure: XI ROBOTIC ASSISTED TOTAL HYSTERECTOMY WITH BILATERAL SALPINGO OOPHORECTOMY;  Surgeon: [name], MD;  Location[facility];  Service: Gynecology;  Laterality: Bilateral;    OB History     Gravida  1   Para      Term      Preterm      AB      Living  2      SAB      IAB      Ectopic      Multiple  1   Live Births  2            Home Medications    Prior to Admission medications   Medication Sig Start Date End Date Taking? Authorizing Provider  promethazine-dextromethorphan (PROMETHAZINE-DM) 6.25-15 MG/5ML syrup Take 5 mLs by mouth 4 (four) times daily as needed for cough. 06/07/21  Yes Lamptey, Myrene Galas, MD  Famotidine (PEPCID PO) Take 1 tablet by mouth daily as needed (acid reflux).    [provider]  ibuprofen (ADVIL) 800 MG tablet Take 1 tablet (800 mg total) by mouth every 8 (eight) hours as needed for mild pain or moderate pain. 06/24/19   Irene Limbo, MD  nitrofurantoin, macrocrystal-monohydrate, (MACROBID) 100 MG capsule TAKE 1 CAPSULE BY MOUTH 2 TIMES DAILY FOR 10 DAYS 08/13/20 08/13/21  Havery Moros L, PA-C  venlafaxine XR (EFFEXOR-XR) 150 MG 24 hr capsule TAKE 1 CAPSULE BY MOUTH DAILY WITH BREAKFAST. 05/06/21 05/06/22  [name], MD     Family History Family History  Problem Relation Age of Onset   Stroke Maternal Grandmother    Stroke Father    Hypertension Father    Other Father        Adopted    Social History Social History   Tobacco Use   Smoking status: Former    Packs/day: 1.00    Years: 17.00    Pack years: 17.00    Types: Cigarettes    Quit date: 12/15/2016    Years since quitting: 4.4   Smokeless tobacco: Never  Vaping Use   Vaping Use: Never used  Substance Use Topics   Alcohol use: No   Drug use: No     Allergies   Amoxicillin, Other, Chlorhexidine gluconate, and Nicotine   Review of Systems Review of Systems  Constitutional: Negative.  Negative for chills and fever.  HENT:  Positive for congestion, sore throat and voice change.   Respiratory:  Positive for cough. Negative for shortness of breath and wheezing.   Cardiovascular:  Negative for chest  pain.  Gastrointestinal: Negative.  Negative for abdominal pain, nausea and vomiting.  Musculoskeletal:  Positive for arthralgias and myalgias.    Physical Exam Triage Vital Signs ED Triage Vitals  Enc Vitals Group     BP 06/07/21 1622 116/71     Pulse Rate 06/07/21 1622 (!) 105     Resp 06/07/21 1622 18     Temp 06/07/21 1622 99.2 F (37.3 C)     Temp Source 06/07/21 1622 Oral     SpO2 06/07/21 1622 100 %     Weight --      Height --      Head Circumference --      Peak Flow --      Pain Score 06/07/21 1620 5     Pain Loc --      Pain Edu? --      Excl. in [place]? --    No data found.  Updated Vital Signs BP 116/71 (BP Location: Left Arm)    Pulse (!) 105    Temp 99.2 F (37.3 C) (Oral)    Resp 18    LMP 01/07/2017 (Approximate)    SpO2 100%   Visual Acuity Right Eye Distance:   Left Eye Distance:   Bilateral Distance:    Right Eye Near:   Left Eye Near:    Bilateral Near:     Physical Exam Vitals and nursing note reviewed.  Constitutional:      General: She is not in acute distress.    Appearance: She is not  ill-appearing or diaphoretic.  HENT:     Right Ear: Tympanic membrane normal.     Left Ear: Tympanic membrane normal.     Mouth/Throat:     Mouth: Mucous membranes are moist.     Pharynx: No pharyngeal swelling or posterior oropharyngeal erythema.  Cardiovascular:     Rate and Rhythm: Normal rate and regular rhythm.  Pulmonary:     Effort: Pulmonary effort is normal.     Breath sounds: Normal breath sounds.  Neurological:     Mental Status: She is alert.     UC Treatments / Results  Labs (all labs ordered are listed, but only abnormal results are displayed) Labs Reviewed  SARS CORONAVIRUS 2 (TAT 6-24 HRS)  POCT RAPID STREP A, ED / UC  POCT RAPID STREP A, ED / UC    EKG   Radiology No results found.  Procedures Procedures (including critical care time)  Medications Ordered in UC Medications - No data to display  Initial Impression / Assessment and Plan / UC Course  I have reviewed the triage vital signs and the nursing notes.  Pertinent labs & imaging results that were available during my care of the patient were reviewed by me and considered in my medical decision making (see chart for details).     1.  Acute viral laryngitis: Voice rest Point-of-care strep is negative Throat cultures have been sent. COVID-19 PCR test has been sent Humidifier use will help with hoarseness of voice Promethazine-DM as needed for cough We will call you with recommendations if labs are abnormal Return to urgent care if symptoms worsen. Final Clinical Impressions(s) / UC Diagnoses   Final diagnoses:  Acute viral laryngitis     Discharge Instructions      Voice rest Increase oral fluid intake Take medications as prescribed Hoarseness of voice is likely viral and it is transient.   ED Prescriptions     Medication Sig Dispense Auth. Provider  promethazine-dextromethorphan (PROMETHAZINE-DM) 6.25-15 MG/5ML syrup Take 5 mLs by mouth 4 (four) times daily as needed for  cough. 118 mL Twanna Resh, Myrene Galas, MD      PDMP not reviewed this encounter.   Chase Picket, MD 06/07/21 [phone]

## 2021-06-08 LAB — SARS CORONAVIRUS 2 (TAT 6-24 HRS): SARS Coronavirus 2: NEGATIVE

## 2021-06-11 ENCOUNTER — Other Ambulatory Visit (HOSPITAL_COMMUNITY): Payer: Self-pay

## 2021-07-09 ENCOUNTER — Other Ambulatory Visit (HOSPITAL_COMMUNITY): Payer: Self-pay

## 2021-08-08 ENCOUNTER — Other Ambulatory Visit (HOSPITAL_COMMUNITY): Payer: Self-pay

## 2021-08-09 ENCOUNTER — Other Ambulatory Visit (HOSPITAL_COMMUNITY): Payer: Self-pay

## 2021-08-13 ENCOUNTER — Telehealth: Payer: Self-pay | Admitting: Hematology

## 2021-08-13 NOTE — Telephone Encounter (Signed)
Left message to call back to schedule follow-up appointment per 3/6 staff message. Patient requesting to see provider regarding exhaustion. ?

## 2021-08-14 ENCOUNTER — Telehealth: Payer: Self-pay | Admitting: Hematology

## 2021-08-14 ENCOUNTER — Telehealth: Payer: Self-pay | Admitting: Physician Assistant

## 2021-08-14 ENCOUNTER — Other Ambulatory Visit: Payer: Self-pay

## 2021-08-14 NOTE — Telephone Encounter (Signed)
Rescheduled upcoming appointment per provider's request. Patient is aware of changes. ?

## 2021-08-14 NOTE — Telephone Encounter (Signed)
Scheduled follow-up appointment per 3/6 staff message. Patient is aware. ?

## 2021-08-19 ENCOUNTER — Other Ambulatory Visit: Payer: Self-pay | Admitting: Physician Assistant

## 2021-08-19 ENCOUNTER — Telehealth: Payer: Self-pay | Admitting: Physician Assistant

## 2021-08-19 ENCOUNTER — Ambulatory Visit: Payer: Medicaid Other | Admitting: Physician Assistant

## 2021-08-19 ENCOUNTER — Other Ambulatory Visit: Payer: Medicaid Other

## 2021-08-19 DIAGNOSIS — C50412 Malignant neoplasm of upper-outer quadrant of left female breast: Secondary | ICD-10-CM

## 2021-08-19 DIAGNOSIS — Z170 Estrogen receptor positive status [ER+]: Secondary | ICD-10-CM

## 2021-08-19 NOTE — Telephone Encounter (Signed)
Rescheduled 03/13 to 03/14 per patient's request, provider agreed on time and date as well. ?

## 2021-08-20 ENCOUNTER — Inpatient Hospital Stay: Payer: Medicaid Other

## 2021-08-20 ENCOUNTER — Inpatient Hospital Stay: Payer: Medicaid Other | Admitting: Physician Assistant

## 2021-08-21 ENCOUNTER — Inpatient Hospital Stay: Payer: Medicaid Other

## 2021-08-21 ENCOUNTER — Inpatient Hospital Stay: Payer: Medicaid Other | Attending: Physician Assistant | Admitting: Physician Assistant

## 2021-08-21 ENCOUNTER — Other Ambulatory Visit: Payer: Self-pay

## 2021-08-21 ENCOUNTER — Other Ambulatory Visit: Payer: Medicaid Other

## 2021-08-21 ENCOUNTER — Other Ambulatory Visit (HOSPITAL_COMMUNITY): Payer: Self-pay

## 2021-08-21 ENCOUNTER — Ambulatory Visit: Payer: Medicaid Other | Admitting: Physician Assistant

## 2021-08-21 ENCOUNTER — Encounter: Payer: Self-pay | Admitting: Physician Assistant

## 2021-08-21 VITALS — BP 128/94 | HR 87 | Temp 97.7°F | Resp 17 | Wt 216.7 lb

## 2021-08-21 DIAGNOSIS — Z9013 Acquired absence of bilateral breasts and nipples: Secondary | ICD-10-CM | POA: Insufficient documentation

## 2021-08-21 DIAGNOSIS — E559 Vitamin D deficiency, unspecified: Secondary | ICD-10-CM | POA: Insufficient documentation

## 2021-08-21 DIAGNOSIS — Z79899 Other long term (current) drug therapy: Secondary | ICD-10-CM | POA: Insufficient documentation

## 2021-08-21 DIAGNOSIS — Z853 Personal history of malignant neoplasm of breast: Secondary | ICD-10-CM | POA: Diagnosis not present

## 2021-08-21 DIAGNOSIS — R232 Flushing: Secondary | ICD-10-CM | POA: Insufficient documentation

## 2021-08-21 DIAGNOSIS — M79601 Pain in right arm: Secondary | ICD-10-CM | POA: Diagnosis not present

## 2021-08-21 DIAGNOSIS — R399 Unspecified symptoms and signs involving the genitourinary system: Secondary | ICD-10-CM

## 2021-08-21 DIAGNOSIS — B369 Superficial mycosis, unspecified: Secondary | ICD-10-CM | POA: Diagnosis not present

## 2021-08-21 DIAGNOSIS — C50412 Malignant neoplasm of upper-outer quadrant of left female breast: Secondary | ICD-10-CM

## 2021-08-21 DIAGNOSIS — Z1501 Genetic susceptibility to malignant neoplasm of breast: Secondary | ICD-10-CM | POA: Diagnosis not present

## 2021-08-21 LAB — CBC WITH DIFFERENTIAL (CANCER CENTER ONLY)
Abs Immature Granulocytes: 0.03 10*3/uL (ref 0.00–0.07)
Basophils Absolute: 0.1 10*3/uL (ref 0.0–0.1)
Basophils Relative: 1 %
Eosinophils Absolute: 0.1 10*3/uL (ref 0.0–0.5)
Eosinophils Relative: 2 %
HCT: 41.8 % (ref 36.0–46.0)
Hemoglobin: 14.3 g/dL (ref 12.0–15.0)
Immature Granulocytes: 1 %
Lymphocytes Relative: 29 %
Lymphs Abs: 1.8 10*3/uL (ref 0.7–4.0)
MCH: 28.3 pg (ref 26.0–34.0)
MCHC: 34.2 g/dL (ref 30.0–36.0)
MCV: 82.6 fL (ref 80.0–100.0)
Monocytes Absolute: 0.5 10*3/uL (ref 0.1–1.0)
Monocytes Relative: 8 %
Neutro Abs: 3.7 10*3/uL (ref 1.7–7.7)
Neutrophils Relative %: 59 %
Platelet Count: 259 10*3/uL (ref 150–400)
RBC: 5.06 MIL/uL (ref 3.87–5.11)
RDW: 12.6 % (ref 11.5–15.5)
WBC Count: 6.2 10*3/uL (ref 4.0–10.5)
nRBC: 0 % (ref 0.0–0.2)

## 2021-08-21 LAB — CMP (CANCER CENTER ONLY)
ALT: 37 U/L (ref 0–44)
AST: 20 U/L (ref 15–41)
Albumin: 4.2 g/dL (ref 3.5–5.0)
Alkaline Phosphatase: 82 U/L (ref 38–126)
Anion gap: 7 (ref 5–15)
BUN: 19 mg/dL (ref 6–20)
CO2: 25 mmol/L (ref 22–32)
Calcium: 9.3 mg/dL (ref 8.9–10.3)
Chloride: 105 mmol/L (ref 98–111)
Creatinine: 0.86 mg/dL (ref 0.44–1.00)
GFR, Estimated: >60 mL/min (ref >60)
Glucose, Bld: 108 mg/dL — ABNORMAL HIGH (ref 70–99)
Potassium: 3.8 mmol/L (ref 3.5–5.1)
Sodium: 137 mmol/L (ref 135–145)
Total Bilirubin: 0.3 mg/dL (ref 0.3–1.2)
Total Protein: 8.1 g/dL (ref 6.5–8.1)

## 2021-08-21 LAB — TSH: TSH: 2.356 u[IU]/mL (ref 0.308–3.960)

## 2021-08-21 LAB — VITAMIN D 25 HYDROXY (VIT D DEFICIENCY, FRACTURES): Vit D, 25-Hydroxy: 21.37 ng/mL — ABNORMAL LOW (ref 30–100)

## 2021-08-21 MED ORDER — NYSTATIN 100000 UNIT/GM EX POWD
1.0000 "application " | Freq: Three times a day (TID) | CUTANEOUS | 0 refills | Status: DC
  Filled 2021-08-21: qty 15, 5d supply, fill #0

## 2021-08-21 NOTE — Progress Notes (Signed)
?[facility]   ?[phone] [phone]   ?Clinic Follow up Note  ? ?Patient Care Team: ?Orvis Brill, MD as PCP - General ?[name], MD as Consulting Physician (General Surgery) ?[name], MD as Consulting Physician (Hematology) ?[name], MD as Consulting Physician (Radiation Oncology) ? ?Date of Service:  08/22/2021 ? ?CHIEF COMPLAINT: Follow-up left breast cancer  ? ?SUMMARY OF ONCOLOGIC HISTORY: ?Oncology History Overview Note  ?Cancer Staging ?Malignant neoplasm of upper-outer quadrant of left breast in female, estrogen receptor positive (Kathryn Stanley) ?Staging form: Breast, AJCC 8th Edition ?- Clinical stage from 12/29/2016: Stage IIIA (cT2, cN1, cM0, G3, ER: Positive, PR: Negative, HER2: Negative) - Signed by [name], MD on 01/07/2017 ?- Pathologic: No Stage Recommended (ypT1c, pN0, cM0, G2, ER-, PR-, HER2-) - Unsigned ?Cancer Staging ?Malignant neoplasm of upper-outer quadrant of left breast in female, estrogen receptor positive (Kathryn Stanley) ?Staging form: Breast, AJCC 8th Edition ?- Clinical stage from 12/29/2016: Stage IIIA (cT2, cN1, cM0, G3, ER: Positive, PR: Negative, HER2: Negative) - Signed by [name], MD on 01/07/2017 ?- Pathologic: No Stage Recommended (ypT1c, pN0, cM0, G2, ER-, PR-, HER2-) - Unsigned ? ? ?  ?Malignant neoplasm of upper-outer quadrant of left breast in female, estrogen receptor positive (Kathryn Stanley)  ?12/25/2016 Mammogram  ? [place] and MM Diagnostic Breast Tomo Bilateral 12/25/16 ?IMPRESSION: ?1. Suspicious mass 2.3 x 1.6 x 3.0 cm  in the 130 o'clock location of the left breast 8cm from the nipple, biopsy recommended. Adjacent simple cyst is 1.9 cm. ?2. Suspicious left axillary lymph node 2.2cm for which biopsy is indicated. ?3. Indeterminate group of calcifications in the upper central portion of the left breast for which biopsy is recommended. ? ?  ?12/29/2016 Initial Biopsy  ? Diagnosis 12/29/16 ?1. Breast, left, needle core biopsy, stereotactic, upper  inner quadrant ?- FIBROCYSTIC CHANGES INCLUDING APOCRINE METAPLASIA WITH CALCIFICATIONS ?- NO MALIGNANCY IDENTIFIED ?2. Breast, left, needle core biopsy, ultrasound, 1:30 o'clock ?- INVASIVE MAMMARY CARCINOMA, G3 ?- SEE COMMENT ?3. Lymph node, needle/core biopsy, ultrasound, left axillary ?- METASTIC CARCINOMA INVOLVING ONE LYMPH NODE (1/1) ?E-cadherin is positive supporting a ductal origin. ? ?  ?12/29/2016 Receptors her2  ? ER 15%+, weak staining  ?PR - ?HER2- ?Ki67 10% ? ?  ?12/29/2016 Initial Diagnosis  ? Malignant neoplasm of upper-outer quadrant of left breast in female, estrogen receptor positive (Kathryn Stanley) ?  ?01/13/2017 Echocardiogram  ? ECHO 01/13/17 ?Study Conclusions ? - Left ventricle: The cavity size was normal. Systolic function was ?  normal. The estimated ejection fraction was in the range of 60% ?  to 65%. Wall motion was normal; there were no regional wall ?  motion abnormalities. Left ventricular diastolic function ?  parameters were normal. ?- Mitral valve: There was trivial regurgitation. ?- Right atrium: The atrium was mildly dilated. ?- Tricuspid valve: There was trivial regurgitation. ? ?  ?01/14/2017 Surgery  ? Port placement by Dr. Donne Hazel  ?  ?01/15/2017 Imaging  ? Whole Body bone Scan 01/15/17 ? IMPRESSION: ?1.  No significant abnormality identified. ?  ?01/15/2017 Imaging  ? CT CAP W Contrast 01/15/17 ?IMPRESSION: ?1. Left lateral breast mass with pathologic left axillary adenopathy ?including a 2.0 cm left axillary lymph node. ?2. Several hypodense lesions in the liver are likely benign lesions ?such as cysts. However, these are technically too small to ?characterize by CT. Possibilities for further workup include ?surveillance or hepatic protocol MRI with and without contrast. ?3. There is also a tiny hypodense lesion in the spleen which  is ?technically nonspecific but statistically highly likely to be ?benign. ?4.  Prominent stool throughout the colon favors constipation ?  ?01/16/2017 - 06/05/2017  Chemotherapy  ? Neoadjuvant chemo AC every 2 weeks for 4 cycles starting on 01/16/2017 and ended 02/27/17. Then proceed with weekly taxol for 12 treatments starting 03/13/17. Added Carboplatin AUC 2 on 04/03/17 to weekly taxol. Due to symptomatic thrombocytopenia we held Carbo since cycle 8. Cycle 9 was post-poned due to neutropenia and Granix was added starting 05/12/17. Added carbo back at reduced dose AUC 1.5 with cycle 10 on 05/22/17. Plan to complete weekly CT on 06/05/17  ? ?  ?03/05/2017 Genetic Testing  ? 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   ?Analysis also detected a Variant of Uncertain Significance (VUS) in the NBN gene called c.628G>T (p.Val210Phe) ? ?Update: The Variant of Uncertain Significance NBN c.628G>T (p.Val210Phe) has been reclassified to "Likely Benign." Report date is 04/09/2018.  ?  ?06/12/2017 Imaging  ? MR BREAST BILATERAL WO CONTRAST ?IMPRESSION: ?1. Significant positive response to neoadjuvant chemotherapy. The ?index carcinoma in the left breast has significantly decreased in ?size. Left axillary adenopathy has also significantly improved. ?2. No other evidence of malignancy. ?  ?07/16/2017 Surgery  ? Pt underwent a bilateral mastectomy and left SLN biopsy with Dr. Donne Hazel ?  ?07/16/2017 Pathology Results  ? Diagnosis 07/16/17 ?1. Breast, simple mastectomy, Right Prophylactic ?- FIBROCYSTIC CHANGES INCLUDING APOCRINE METAPLASIA ?- DUCT ECTASIA ?- CALCIFICATIONS ?- NO MALIGNANCY IDENTIFIED ?2. Lymph node, sentinel, biopsy, Left Axillary ?- NO CARCINOMA IDENTIFIED IN ONE LYMPH NODE (0/1) ?- SEE COMMENT ?3. Lymph node, sentinel, biopsy, Left ?- NO CARCINOMA IDENTIFIED IN ONE LYMPH NODE (0/1) ?- SEE COMMENT ?4. Breast, simple mastectomy, Left ?- INVASIVE DUCTAL CARCINOMA, NOTTINGHAM GRADE 2 OF 3, 1.3 CM ?- MARGINS UNINVOLVED BY CARCINOMA (2 CM POSTERIOR MARGIN) ?- PREVIOUS BIOPSY SITE CHANGES ?- CALCIFICATIONS ?- SEE ONCOLOGY TABLE  BELOW ?  ?07/16/2017 Receptors her2  ? Results: ?IMMUNOHISTOCHEMICAL AND MORPHOMETRIC ANALYSIS PERFORMED MANUALLY ?Estrogen Receptor: 0%, NEGATIVE ?Progesterone Receptor: 0%, NEGATIVE ?Results: ?HER2 - NEGATIVE ?RATIO OF HER2/CEP17 SIGNALS 1.73 ?AVERAGE HER2 COPY NUMBER PER CELL 2.67 ?  ?09/02/2017 - 10/16/2017 Radiation Therapy  ? Adjuvant radiation per Dr. Lisbeth Grimm ? ?  ?11/03/2017 Surgery  ? XI ROBOTIC ASSISTED TOTAL HYSTERECTOMY WITH BILATERAL SALPINGO OOPHORECTOMY  ?by Dr. Denman George and Dr. Delsa Sale ?11/03/17 ?  ?11/23/2017 - 02/08/2018 Chemotherapy  ? Adjuvant Xeloda 2061m BID 2 weeks on and 1 week off ?  ?06/25/2018 Surgery  ? Bilateral breast reconstruction with Dr. TIran Planas ?06/25/18  ?  ? ? ? ?CURRENT THERAPY:  ?Surveillance ? ?INTERVAL HISTORY:  ?Kathryn COTTAMis here for a follow up of left breast cancer. She was last seen by Dr. FBurr Medicoin March 2022. Patient reports that she has chronic fatigue and becomes easily exhausted. She adds that she works full time and picks up extra shifts. She sleeps well but husband adds that she snores throughout her sleep. She has a good appetite and is trying to eat healthy to loose weight. Her weight fluctuates and she hasn't reached her goal for the breast reconstruction surgery. Additionally, she reports that she has not decided if she wants to pursue the breast reconstruction surgery. She denies nausea, vomiting or abdominal pain. Her bowel habits are regular without any diarrhea or constipation. She continues to have recurrent urinary symptoms with frequency, urgency and dysuria. She denies easy bruising or signs of bleeding. She has intermittent episodes  of headaches but denies any dizziness or syncopal episodes. Over the past two months, she reports pain around the right shoulder and upper arm. The pain is intermittent and she rates it as 5-8 out of 10 on a pain scale. She denies any numbness or tingling in the right upper extremity. She denies fevers, chills, night sweats,  shortness of breath, chest pain or cough. She has no other complaints. Rest of the ROS is below.  ? ? ?REVIEW OF SYSTEMS:   ?Constitutional: Denies fevers, chills or abnormal weight loss ?Eyes: Denies blurriness o

## 2021-08-22 ENCOUNTER — Encounter: Payer: Self-pay | Admitting: Hematology

## 2021-08-22 ENCOUNTER — Other Ambulatory Visit (HOSPITAL_COMMUNITY): Payer: Self-pay

## 2021-08-22 MED ORDER — VITAMIN D 25 MCG (1000 UNIT) PO TABS
1000.0000 [IU] | ORAL_TABLET | Freq: Every day | ORAL | 6 refills | Status: DC
  Filled 2021-08-22 – 2022-01-06 (×2): qty 30, 30d supply, fill #0

## 2021-08-26 ENCOUNTER — Other Ambulatory Visit: Payer: Medicaid Other

## 2021-08-28 ENCOUNTER — Ambulatory Visit
Admission: RE | Admit: 2021-08-28 | Discharge: 2021-08-28 | Disposition: A | Discharge status: Home / Self Care | Payer: Medicaid Other | Source: Ambulatory Visit | Attending: Physician Assistant | Admitting: Physician Assistant

## 2021-08-28 ENCOUNTER — Other Ambulatory Visit: Payer: Self-pay

## 2021-08-28 DIAGNOSIS — M79601 Pain in right arm: Secondary | ICD-10-CM

## 2021-08-29 ENCOUNTER — Telehealth: Payer: Self-pay

## 2021-08-29 NOTE — Telephone Encounter (Signed)
Pt advised via My Chart.  Pt is not having any pain but will let us know if it returns. ?

## 2021-08-29 NOTE — Telephone Encounter (Signed)
-----   Message from Lincoln Brigham, PA-C sent at 08/29/2021 12:15 PM EDT ----- ?Please notify patient that [place] was negative. Please ask if she continues to have pain the right shoulder/upper arm? ?

## 2021-09-06 ENCOUNTER — Other Ambulatory Visit: Payer: Self-pay

## 2021-09-08 ENCOUNTER — Other Ambulatory Visit (HOSPITAL_COMMUNITY): Payer: Self-pay

## 2021-09-09 ENCOUNTER — Other Ambulatory Visit (HOSPITAL_COMMUNITY): Payer: Self-pay

## 2021-10-10 ENCOUNTER — Other Ambulatory Visit (HOSPITAL_COMMUNITY): Payer: Self-pay

## 2021-10-21 ENCOUNTER — Ambulatory Visit (INDEPENDENT_AMBULATORY_CARE_PROVIDER_SITE_OTHER): Payer: Medicaid Other | Admitting: Internal Medicine

## 2021-10-21 ENCOUNTER — Other Ambulatory Visit: Payer: Self-pay

## 2021-10-21 ENCOUNTER — Encounter: Payer: Self-pay | Admitting: Internal Medicine

## 2021-10-21 VITALS — BP 116/84 | HR 67 | Temp 98.1°F | Resp 28 | Ht 64.0 in | Wt 204.9 lb

## 2021-10-21 DIAGNOSIS — E669 Obesity, unspecified: Secondary | ICD-10-CM | POA: Diagnosis not present

## 2021-10-21 DIAGNOSIS — R5383 Other fatigue: Secondary | ICD-10-CM | POA: Diagnosis not present

## 2021-10-21 DIAGNOSIS — N951 Menopausal and female climacteric states: Secondary | ICD-10-CM

## 2021-10-21 DIAGNOSIS — Z6835 Body mass index (BMI) 35.0-35.9, adult: Secondary | ICD-10-CM

## 2021-10-21 DIAGNOSIS — E559 Vitamin D deficiency, unspecified: Secondary | ICD-10-CM | POA: Diagnosis present

## 2021-10-21 DIAGNOSIS — G4733 Obstructive sleep apnea (adult) (pediatric): Secondary | ICD-10-CM

## 2021-10-21 DIAGNOSIS — Z0000 Encounter for general adult medical examination without abnormal findings: Secondary | ICD-10-CM

## 2021-10-21 NOTE — Patient Instructions (Signed)
[name], it was a pleasure seeing you today! ? ?Today we discussed: ? ?Allergies: Pick up Zyrtec (cetrizine) at the outpatient pharmacy.  ? ?Fatigue: You will receive a call to arrange a sleep study. ? ?Vitamin D deficiency: Continue taking Vitamin D.  ? ? ? ?Follow-up: 4-6 months  ? ?Please make sure to arrive 15 minutes prior to your next appointment. If you arrive late, you may be asked to reschedule.  ? ?We look forward to seeing you next time. Please call our clinic at [phone] if you have any questions or concerns. The best time to call is Monday-Friday from 9am-4pm, but there is someone available 24/7. If after hours or the weekend, call the main hospital number and ask for the Internal Medicine Resident On-Call. If you need medication refills, please notify your pharmacy one week in advance and they will send [place] a request. ? ?Thank you for letting us take part in your care. Wishing you the best! ? ? ?

## 2021-10-21 NOTE — Assessment & Plan Note (Addendum)
She has been experiencing fatigue since 2018.  She awakens feeling tired and often feels like napping during the day.  She snores at night.   ?-Stop bang score suggests intermediate risk for moderate to severe OSA.  Sleep study referral placed ?- Reviewed labs from March.  Hemoglobin and TSH are normal.  No electrolyte derangements or evidence of renal dysfunction. ?- PHQ-9 is 0.  On Effexor for hot flashes and mood changes status post bilateral oophorectomy ?- If the sleep study is negative, would consider diagnosis of chronic fatigue syndrome. ?

## 2021-10-21 NOTE — Progress Notes (Signed)
Internal Medicine Clinic Attending  Case discussed with Dr. Christian  At the time of the visit.  We reviewed the resident's history and exam and pertinent patient test results.  I agree with the assessment, diagnosis, and plan of care documented in the resident's note.  

## 2021-10-21 NOTE — Assessment & Plan Note (Signed)
Managed well on Effexor.  Continue management. ?

## 2021-10-21 NOTE — Assessment & Plan Note (Signed)
Vitamin D level was found to be 21 in March of this last year.  Since then, she has been taking vitamin D supplementation.  She would like to have her vitamin D level rechecked. ?

## 2021-10-21 NOTE — Assessment & Plan Note (Signed)
Weight is down another 5 pounds.  She has been working on improving her diet, reducing caffeine, and increasing physical activity.  Patient congratulated on her hard work. ?

## 2021-10-21 NOTE — Progress Notes (Signed)
? ?  Office Visit ? ? Patient ID: Kathryn Stanley, female    DOB: 1976/04/08, 46 y.o.   MRN: [id number]   PCP: Orvis Brill, MD  ? ?Subjective:  ?CC: Follow-up (Vit D ) and Fatigue  ? ?Kathryn Stanley is a 46 y.o. year old female who presents for the above medical condition(s). Please refer to problem based charting for assessment and plan. ? ?Objective:  ? ?BP 116/84 (BP Location: Right Arm, Patient Position: Sitting, Cuff Size: Normal)   Pulse 67   Temp 98.1 ?F (36.7 ?C) (Oral)   Resp (!) 28   Ht 5' 4"  (1.626 m)   Wt 204 lb 14.4 oz (92.9 kg)   LMP 01/07/2017 (Approximate)   SpO2 100% Comment: room air  BMI 35.17 kg/m?  ? ?General: Well-appearing female in no distress ?Cardiac: Heart regular rate and rhythm, no lower extremity edema ?Pulm: Lung sounds clear throughout ?Assessment & Plan:  ? ?Problem List Items Addressed This Visit   ? ? Menopausal symptoms  ?  Managed well on Effexor.  Continue management. ? ?  ?  ? Obesity (BMI 35.0-39.9 without comorbidity)  ?  Weight is down another 5 pounds.  She has been working on improving her diet, reducing caffeine, and increasing physical activity.  Patient congratulated on her hard work. ? ?  ?  ? Other fatigue  ?  She has been experiencing fatigue since 2018.  She awakens feeling tired and often feels like napping during the day.  She snores at night.   ?-Stop bang score suggests intermediate risk for moderate to severe OSA.  Sleep study referral placed ?- Reviewed labs from March.  Hemoglobin and TSH are normal.  No electrolyte derangements or evidence of renal dysfunction. ?- PHQ-9 is 0.  On Effexor for hot flashes and mood changes status post bilateral oophorectomy ?- If the sleep study is negative, would consider diagnosis of chronic fatigue syndrome. ? ?  ?  ? Vitamin D deficiency - Primary  ?  Vitamin D level was found to be 21 in March of this last year.  Since then, she has been taking vitamin D supplementation.  She would like to have her vitamin D  level rechecked. ? ?  ?  ? ? ?Pt discussed with Dr. Cain Sieve ? ?Mitzi Hansen, MD ?Internal Medicine Resident PGY-3 ?[facility] ?10/21/2021 10:55 AM  ?  ?

## 2021-10-22 LAB — VITAMIN D 25 HYDROXY (VIT D DEFICIENCY, FRACTURES): Vit D, 25-Hydroxy: 41.9 ng/mL (ref 30.0–100.0)

## 2021-10-22 NOTE — Progress Notes (Signed)
Your Vitamin D level is normal. Continue taking the vitamin D supplement as you have been.  ?Let me know if you have questions or concerns.  ? ?Mitzi Hansen, MD

## 2021-10-31 ENCOUNTER — Other Ambulatory Visit: Payer: Self-pay

## 2021-10-31 ENCOUNTER — Other Ambulatory Visit (HOSPITAL_COMMUNITY): Payer: Self-pay

## 2021-10-31 MED ORDER — VENLAFAXINE HCL ER 150 MG PO CP24
ORAL_CAPSULE | Freq: Every day | ORAL | 5 refills | Status: DC
  Filled 2021-10-31: qty 30, fill #0
  Filled 2021-11-11: qty 30, 30d supply, fill #0
  Filled 2021-12-12: qty 30, 30d supply, fill #1
  Filled 2022-01-06: qty 30, 30d supply, fill #2
  Filled 2022-02-14: qty 30, 30d supply, fill #3
  Filled 2022-03-18: qty 30, 30d supply, fill #4
  Filled 2022-04-19: qty 30, 30d supply, fill #5

## 2021-10-31 NOTE — Progress Notes (Signed)
Effexor refilled.

## 2021-11-11 ENCOUNTER — Other Ambulatory Visit (HOSPITAL_COMMUNITY): Payer: Self-pay

## 2021-11-30 ENCOUNTER — Encounter: Payer: Self-pay | Admitting: *Deleted

## 2021-12-05 ENCOUNTER — Telehealth: Payer: Self-pay | Admitting: Student

## 2021-12-05 NOTE — Telephone Encounter (Signed)
Rec'd a Denial from [facility] . A Polysomnography  was ordered by her previous Provider, Dr. Darrick Meigs. The Denial Letter has been placed in your box for review as the [facility] is requesting a response.  The patient has not seen her new PCP Dr. Carin Primrose and you name per Elvina Sidle is on the Order that was placed.  Please advise .  Thank you in Advance for in suggestions or Advice for the next step.

## 2021-12-12 ENCOUNTER — Other Ambulatory Visit (HOSPITAL_COMMUNITY): Payer: Self-pay

## 2021-12-12 ENCOUNTER — Other Ambulatory Visit: Payer: Self-pay | Admitting: Internal Medicine

## 2021-12-12 DIAGNOSIS — G4733 Obstructive sleep apnea (adult) (pediatric): Secondary | ICD-10-CM

## 2021-12-12 NOTE — Telephone Encounter (Signed)
Denial reviewed.  I will try an order for a home sleep test.

## 2022-01-06 ENCOUNTER — Other Ambulatory Visit (HOSPITAL_COMMUNITY): Payer: Self-pay

## 2022-02-09 ENCOUNTER — Encounter: Payer: Self-pay | Admitting: Student

## 2022-02-17 ENCOUNTER — Encounter: Payer: Self-pay | Admitting: Hematology

## 2022-02-17 ENCOUNTER — Other Ambulatory Visit (HOSPITAL_COMMUNITY): Payer: Self-pay

## 2022-02-21 ENCOUNTER — Encounter: Payer: Self-pay | Admitting: Hematology

## 2022-02-28 ENCOUNTER — Encounter: Payer: Self-pay | Admitting: Student

## 2022-02-28 ENCOUNTER — Encounter: Payer: Medicaid Other | Admitting: Student

## 2022-02-28 NOTE — Progress Notes (Deleted)
OSA (obstructive sleep apnea) Sleep study  Mixed hyperlipidemia Statin?  Hgb A1c No follow-ups on file.   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   S/P hysterectomy with oophorectomy 01/04/2020   Mixed hyperlipidemia 01/04/2020   Menopausal symptoms 02/01/2019   Obesity (BMI 35.0-39.9 without comorbidity) 02/01/2019   History of breast cancer 06/25/2018   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   Genetic testing 03/05/2017   Port catheter in place 01/30/2017   Malignant neoplasm of upper-outer quadrant of left breast in female, estrogen receptor positive ([place]) 01/05/2017   Tobacco use 12/12/2016     Current Outpatient Medications:    cholecalciferol (VITAMIN D3) 25 MCG (1000 UNIT) tablet, Take 1 tablet (1,000 Units total) by mouth daily., Disp: 30 tablet, Rfl: 6   Famotidine (PEPCID PO), Take 1 tablet by mouth daily as needed (acid reflux)., Disp: , Rfl:    ibuprofen (ADVIL) 800 MG tablet, Take 1 tablet (800 mg total) by mouth every 8 (eight) hours as needed for mild pain or moderate pain., Disp: 30 tablet, Rfl: 0   nystatin (MYCOSTATIN/NYSTOP) powder, Apply to affected areas three times a day., Disp: 15 g, Rfl: 0   promethazine-dextromethorphan (PROMETHAZINE-DM) 6.25-15 MG/5ML syrup, Take 5 mLs by mouth 4 (four) times daily as needed for cough., Disp: 118 mL, Rfl: 0   venlafaxine XR (EFFEXOR-XR) 150 MG 24 hr capsule, TAKE 1 CAPSULE BY MOUTH DAILY WITH BREAKFAST., Disp: 30 capsule, Rfl: 5  Past Surgical History:  Procedure Laterality Date   APPLICATION OF A-CELL OF CHEST/ABDOMEN Right 06/25/2018   Procedure: ACELLULAR DERMIS TO RIGHT CHEST;  Surgeon: Irene Limbo, MD;  Location: [place];  Service: Plastics;  Laterality: Right;   BREAST BIOPSY Left 12/2016   BREAST IMPLANT EXCHANGE Right 06/24/2019   Procedure:  REVISION RIGHT BREAST RECONSTRUCTION WITH SILICONE IMPLANT EXCAHNGE, STRATTICE TO RIGHT CHEST;  Surgeon: Irene Limbo, MD;  Location: [place];  Service: Plastics;  Laterality: Right;   BREAST RECONSTRUCTION Bilateral 07/16/2017   BILATERAL BREAST RECONSTRUCTION WITH PLACEMENT OF TISSUE EXPANDER AND ALLODERM/notes 07/16/2017   BREAST RECONSTRUCTION  06/25/2018   CESAREAN SECTION  2002   LATISSIMUS FLAP TO BREAST Left 06/25/2018   Procedure: LEFT LATISSIMUS FLAP TO LEFT CHEST;  Surgeon: Irene Limbo, MD;  Location: [place];  Service: Plastics;  Laterality: Left;   LIPOSUCTION Right 06/24/2019   Procedure: LIPOSUCTION RIGHT CHEST;  Surgeon: Irene Limbo, MD;  Location: [place];  Service: Plastics;  Laterality: Right;   MASTECTOMY Bilateral 07/16/2017   LEFT SKIN SPARING MASTECTOMY WITH LEFT RADIOACTIVE SEED TARGETED AXILLARY LYMPH NODE EXCISION AND AXILLARY SENTINEL LYMPH NODE BIOPSY; RIGHT PROPHYLACTIC SKIN SPARING MASTECTOMY [name] 07/16/2017   MASTECTOMY WITH RADIOACTIVE SEED GUIDED EXCISION AND AXILLARY SENTINEL LYMPH NODE BIOPSY Left 07/16/2017   Procedure: LEFT SKIN SPARING MASTECTOMY WITH LEFT RADIOACTIVE SEED TARGETED AXILLARY LYMPH NODE EXCISION AND AXILLARY SENTINEL LYMPH NODE BIOPSY;  Surgeon: [name], MD;  Location: [place];  Service: General;  Laterality: Left;   NIPPLE SPARING MASTECTOMY Right 07/16/2017   Procedure: RIGHT PROPHYLACTIC SKIN SPARING MASTECTOMY;  Surgeon: [name], MD;  Location: [place];  Service: General;  Laterality: Right;   PORT-A-CATH REMOVAL N/A 07/16/2017   Procedure: REMOVAL PORT-A-CATH;  Surgeon: [name], MD;  Location: [place];  Service: General;  Laterality: N/A;   PORTA CATH REMOVAL  07/16/2017   PORTACATH PLACEMENT N/A 01/14/2017   Procedure: INSERTION PORT-A-CATH WITH [place];  Surgeon: [name], MD;  Location: [place];  Service: General;  Laterality: N/A;   REMOVAL OF BILATERAL TISSUE  EXPANDERS WITH PLACEMENT OF BILATERAL BREAST IMPLANTS Bilateral 06/25/2018   Procedure: REMOVAL OF BILATERAL TISSUE EXPANDERS WITH PLACEMENT OF BILATERAL SILICONE BREAST IMPLANTS;  Surgeon: Irene Limbo, MD;  Location: [place];  Service: Plastics;  Laterality: Bilateral;   ROBOTIC ASSISTED TOTAL HYSTERECTOMY WITH BILATERAL SALPINGO OOPHERECTOMY Bilateral 11/03/2017   Procedure: XI ROBOTIC ASSISTED TOTAL HYSTERECTOMY WITH BILATERAL SALPINGO OOPHORECTOMY;  Surgeon: [name], MD;  Location: [facility];  Service: Gynecology;  Laterality: Bilateral;       Latest Ref Rng & Units 08/21/2021    9:59 AM 08/15/2020    8:25 AM 02/16/2020    8:24 AM  CMP  Glucose 70 - 99 mg/dL 108  98  97   BUN 6 - 20 mg/dL _0 Creatinine 0.44 - 1.00 mg/dL 0.86  1.11  0.83   Sodium 135 - 145 mmol/L 137  141  138   Potassium 3.5 - 5.1 mmol/L 3.8  3.8  4.0   Chloride 98 - 111 mmol/L 105  106  109   CO2 22 - 32 mmol/L _1 Calcium 8.9 - 10.3 mg/dL 9.3  9.2  9.1   Total Protein 6.5 - 8.1 g/dL 8.1  7.9  7.5   Total Bilirubin 0.3 - 1.2 mg/dL 0.3  0.2  0.4   Alkaline Phos 38 - 126 U/L 82  82  78   AST 15 - 41 U/L _2 ALT 0 - 44 U/L 37  29  21       Latest Ref Rng & Units 08/21/2021    9:59 AM 08/15/2020    8:25 AM 02/16/2020    8:24 AM  CBC  WBC 4.0 - 10.5 K/uL 6.2  5.9  5.7   Hemoglobin 12.0 - 15.0 g/dL 14.3  13.9  13.6   Hematocrit 36.0 - 46.0 % 41.8  41.1  40.3   Platelets 150 - 400 K/uL 259  229  232       Latest Ref Rng & Units 01/03/2020    4:03 PM  LIPIDS  Total cholesterol 100 - 199 mg/dL 199   HDL cholesterol >39 mg/dL 34   LDL (calc) 0 - 99 mg/dL 120   T.Chol/HDL Ratio 0.0 - 4.4 ratio 5.9   Triglycerides 0 - 149 mg/dL 253        No data to display

## 2022-03-18 ENCOUNTER — Other Ambulatory Visit (HOSPITAL_COMMUNITY): Payer: Self-pay

## 2022-04-19 ENCOUNTER — Other Ambulatory Visit (HOSPITAL_COMMUNITY): Payer: Self-pay

## 2022-05-16 ENCOUNTER — Other Ambulatory Visit: Payer: Self-pay

## 2022-05-19 ENCOUNTER — Other Ambulatory Visit: Payer: Self-pay | Admitting: Hematology

## 2022-05-19 ENCOUNTER — Other Ambulatory Visit (HOSPITAL_COMMUNITY): Payer: Self-pay

## 2022-05-19 MED ORDER — VENLAFAXINE HCL ER 150 MG PO CP24
150.0000 mg | ORAL_CAPSULE | Freq: Every day | ORAL | 5 refills | Status: DC
  Filled 2022-05-19: qty 30, 30d supply, fill #0
  Filled 2022-06-23 (×2): qty 30, 30d supply, fill #1
  Filled 2022-07-23: qty 30, 30d supply, fill #2
  Filled 2022-08-18: qty 30, 30d supply, fill #3
  Filled 2022-09-16: qty 30, 30d supply, fill #4
  Filled 2022-10-17: qty 30, 30d supply, fill #5

## 2022-05-20 ENCOUNTER — Encounter: Payer: Self-pay | Admitting: Hematology

## 2022-05-22 ENCOUNTER — Other Ambulatory Visit (HOSPITAL_COMMUNITY): Payer: Self-pay

## 2022-05-26 NOTE — Assessment & Plan Note (Deleted)
-  She underwent b/l mastectomy with Dr. Donne Hazel on 07/16/17 and underwent total hysterectomy and BSO with Dr. Denman George on 11/03/17.  -Her children will get tested when they are older.

## 2022-05-26 NOTE — Assessment & Plan Note (Deleted)
cT2N1M0, Stage IIIa, ER weakly 15%+, PR - , HER2 - , Grade 3, ypT1cN0M0, triple negative  -She was diagnosed in 12/2016. She is s/p neoadjuvant chemo, AC-CT, B/l mastectomy and reconstruction surgery, adjuvant radiation and adjuvant Xeloda.  -Her tumor was weakly ER positive on initial biopsy, and became triple negative after neoadjuvant chemotherapy. So antiestrogen therapy was not recommended  -she is on cancer surveillance

## 2022-05-26 NOTE — Progress Notes (Deleted)
[place]   [phone] [phone]) [phone]   Clinic Follow up Note   Patient Care Team: Nani Gasser, MD as PCP - General [name], MD as Consulting Physician (General Surgery) [name], MD as Consulting Physician (Hematology) [name], MD as Consulting Physician (Radiation Oncology)  Date of Service:  05/26/2022  CHIEF COMPLAINT: f/u of left breast cancer    CURRENT THERAPY:  Surveillance   ASSESSMENT: *** Kathryn Stanley is a 46 y.o. female with   No problem-specific Assessment & Plan notes found for this encounter.  ***   PLAN:   SUMMARY OF ONCOLOGIC HISTORY: Oncology History Overview Note  Cancer Staging Malignant neoplasm of upper-outer quadrant of left breast in female, estrogen receptor positive ([place]) Staging form: Breast, AJCC 8th Edition - Clinical stage from 12/29/2016: Stage IIIA (cT2, cN1, cM0, G3, ER: Positive, PR: Negative, HER2: Negative) - Signed by [name], MD on 01/07/2017 - Pathologic: No Stage Recommended (ypT1c, pN0, cM0, G2, ER-, PR-, HER2-) - Unsigned Cancer Staging Malignant neoplasm of upper-outer quadrant of left breast in female, estrogen receptor positive ([place]) Staging form: Breast, AJCC 8th Edition - Clinical stage from 12/29/2016: Stage IIIA (cT2, cN1, cM0, G3, ER: Positive, PR: Negative, HER2: Negative) - Signed by [name], MD on 01/07/2017 - Pathologic: No Stage Recommended (ypT1c, pN0, cM0, G2, ER-, PR-, HER2-) - Unsigned     Malignant neoplasm of upper-outer quadrant of left breast in female, estrogen receptor positive ([place])  12/25/2016 Mammogram   [place] and MM Diagnostic Breast Tomo Bilateral 12/25/16 IMPRESSION: 1. Suspicious mass 2.3 x 1.6 x 3.0 cm  in the 130 o'clock location of the left breast 8cm from the nipple, biopsy recommended. Adjacent simple cyst is 1.9 cm. 2. Suspicious left axillary lymph node 2.2cm for which biopsy is indicated. 3. Indeterminate group of calcifications in the  upper central portion of the left breast for which biopsy is recommended.    12/29/2016 Initial Biopsy   Diagnosis 12/29/16 1. Breast, left, needle core biopsy, stereotactic, upper inner quadrant - FIBROCYSTIC CHANGES INCLUDING APOCRINE METAPLASIA WITH CALCIFICATIONS - NO MALIGNANCY IDENTIFIED 2. Breast, left, needle core biopsy, ultrasound, 1:30 o'clock - INVASIVE MAMMARY CARCINOMA, G3 - SEE COMMENT 3. Lymph node, needle/core biopsy, ultrasound, left axillary - METASTIC CARCINOMA INVOLVING ONE LYMPH NODE (1/1) E-cadherin is positive supporting a ductal origin.    12/29/2016 Receptors her2   ER 15%+, weak staining  PR - HER2- Ki67 10%    12/29/2016 Initial Diagnosis   Malignant neoplasm of upper-outer quadrant of left breast in female, estrogen receptor positive ([place])   01/13/2017 Echocardiogram   ECHO 01/13/17 Study Conclusions  - Left ventricle: The cavity size was normal. Systolic function was   normal. The estimated ejection fraction was in the range of 60%   to 65%. Wall motion was normal; there were no regional wall   motion abnormalities. Left ventricular diastolic function   parameters were normal. - Mitral valve: There was trivial regurgitation. - Right atrium: The atrium was mildly dilated. - Tricuspid valve: There was trivial regurgitation.    01/14/2017 Surgery   Port placement by Dr. Donne Hazel    01/15/2017 Imaging   Whole Body bone Scan 01/15/17  IMPRESSION: 1.  No significant abnormality identified.   01/15/2017 Imaging   CT CAP W Contrast 01/15/17 IMPRESSION: 1. Left lateral breast mass with pathologic left axillary adenopathy including a 2.0 cm left axillary lymph node. 2. Several hypodense lesions in the liver are likely benign lesions such as  cysts. However, these are technically too small to characterize by CT. Possibilities for further workup include surveillance or hepatic protocol MRI with and without contrast. 3. There is also a tiny hypodense lesion in  the spleen which is technically nonspecific but statistically highly likely to be benign. 4.  Prominent stool throughout the colon favors constipation   01/16/2017 - 06/05/2017 Chemotherapy   Neoadjuvant chemo AC every 2 weeks for 4 cycles starting on 01/16/2017 and ended 02/27/17. Then proceed with weekly taxol for 12 treatments starting 03/13/17. Added Carboplatin AUC 2 on 04/03/17 to weekly taxol. Due to symptomatic thrombocytopenia we held Carbo since cycle 8. Cycle 9 was post-poned due to neutropenia and Granix was added starting 05/12/17. Added carbo back at reduced dose AUC 1.5 with cycle 10 on 05/22/17. 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   Analysis also detected a Variant of Uncertain Significance (VUS) in the NBN gene called c.628G>T (p.Val210Phe)  Update: The Variant of Uncertain Significance NBN c.628G>T (p.Val210Phe) has been reclassified to "Likely Benign." Report date is 04/09/2018.    06/12/2017 Imaging   MR BREAST BILATERAL WO CONTRAST IMPRESSION: 1. Significant positive response to neoadjuvant chemotherapy. The index carcinoma in the left breast has significantly decreased in size. Left axillary adenopathy has also significantly improved. 2. No other evidence of malignancy.   07/16/2017 Surgery   Pt underwent a bilateral mastectomy and left SLN biopsy with Dr. Donne Hazel   07/16/2017 Pathology Results   Diagnosis 07/16/17 1. Breast, simple mastectomy, Right Prophylactic - FIBROCYSTIC CHANGES INCLUDING APOCRINE METAPLASIA - DUCT ECTASIA - CALCIFICATIONS - NO MALIGNANCY IDENTIFIED 2. Lymph node, sentinel, biopsy, Left Axillary - NO CARCINOMA IDENTIFIED IN ONE LYMPH NODE (0/1) - SEE COMMENT 3. Lymph node, sentinel, biopsy, Left - NO CARCINOMA IDENTIFIED IN ONE LYMPH NODE (0/1) - SEE COMMENT 4. Breast, simple mastectomy, Left - INVASIVE  DUCTAL CARCINOMA, [place]TINGHAM GRADE 2 OF 3, 1.3 CM - MARGINS UNINVOLVED BY CARCINOMA (2 CM POSTERIOR MARGIN) - PREVIOUS BIOPSY SITE CHANGES - CALCIFICATIONS - SEE ONCOLOGY TABLE BELOW   07/16/2017 Receptors her2   Results: IMMUNOHISTOCHEMICAL AND MORPHOMETRIC ANALYSIS PERFORMED MANUALLY Estrogen Receptor: 0%, NEGATIVE Progesterone Receptor: 0%, NEGATIVE Results: HER2 - NEGATIVE RATIO OF HER2/CEP17 SIGNALS 1.73 AVERAGE HER2 COPY NUMBER PER CELL 2.67   09/02/2017 - 10/16/2017 Radiation Therapy   Adjuvant radiation per Dr. Lisbeth Kinnett    11/03/2017 Surgery   XI ROBOTIC ASSISTED TOTAL HYSTERECTOMY WITH BILATERAL SALPINGO OOPHORECTOMY  by Dr. Denman George and Dr. Delsa Sale 11/03/17   11/23/2017 - 02/08/2018 Chemotherapy   Adjuvant Xeloda 201m BID 2 weeks on and 1 week off   06/25/2018 Surgery   Bilateral breast reconstruction with Dr. TIran Planas 06/25/18       INTERVAL HISTORY: *** Kathryn Stanley is here for a follow up of  left breast cancer    She was last seen by PA-C IDede Queryon 08/21/21 She presents to the clinic       All other systems were reviewed with the patient and are negative.  MEDICAL HISTORY:  Past Medical History:  Diagnosis Date   Breast cancer, left breast ([place] dx'd 87/4259  Complication of anesthesia    Dermatologic problem    possible psoriasis. Pt has not had been diagnosed by dermatology.   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  GERD (gastroesophageal reflux disease)    occational   PONV (postoperative nausea and vomiting)    Tobacco dependence    trying to quit with nicotene patches   UTI (urinary tract infection) 04/24/2017   Viral upper respiratory infection 03/11/2018    SURGICAL HISTORY: Past Surgical History:  Procedure Laterality Date   APPLICATION OF A-CELL OF CHEST/ABDOMEN Right 06/25/2018   Procedure: ACELLULAR DERMIS TO RIGHT CHEST;  Surgeon: Irene Limbo, MD;   Location: [facility];  Service: Plastics;  Laterality: Right;   BREAST BIOPSY Left 12/2016   BREAST IMPLANT EXCHANGE Right 06/24/2019   Procedure: REVISION RIGHT BREAST RECONSTRUCTION WITH SILICONE IMPLANT EXCAHNGE, STRATTICE TO RIGHT CHEST;  Surgeon: Irene Limbo, MD;  Location: [facility];  Service: Plastics;  Laterality: Right;   BREAST RECONSTRUCTION Bilateral 07/16/2017   BILATERAL BREAST RECONSTRUCTION WITH PLACEMENT OF TISSUE EXPANDER AND ALLODERM/notes 07/16/2017   BREAST RECONSTRUCTION  06/25/2018   CESAREAN SECTION  2002   LATISSIMUS FLAP TO BREAST Left 06/25/2018   Procedure: LEFT LATISSIMUS FLAP TO LEFT CHEST;  Surgeon: Irene Limbo, MD;  Location: [facility];  Service: Plastics;  Laterality: Left;   LIPOSUCTION Right 06/24/2019   Procedure: LIPOSUCTION RIGHT CHEST;  Surgeon: Irene Limbo, MD;  Location: [facility];  Service: Plastics;  Laterality: Right;   MASTECTOMY Bilateral 07/16/2017   LEFT SKIN SPARING MASTECTOMY WITH LEFT RADIOACTIVE SEED TARGETED AXILLARY LYMPH NODE EXCISION AND AXILLARY SENTINEL LYMPH NODE BIOPSY; RIGHT PROPHYLACTIC SKIN SPARING MASTECTOMY [name] 07/16/2017   MASTECTOMY WITH RADIOACTIVE SEED GUIDED EXCISION AND AXILLARY SENTINEL LYMPH NODE BIOPSY Left 07/16/2017   Procedure: LEFT SKIN SPARING MASTECTOMY WITH LEFT RADIOACTIVE SEED TARGETED AXILLARY LYMPH NODE EXCISION AND AXILLARY SENTINEL LYMPH NODE BIOPSY;  Surgeon: [name], MD;  Location: [facility];  Service: General;  Laterality: Left;   NIPPLE SPARING MASTECTOMY Right 07/16/2017   Procedure: RIGHT PROPHYLACTIC SKIN SPARING MASTECTOMY;  Surgeon: [name], MD;  Location: [facility];  Service: General;  Laterality: Right;   PORT-A-CATH REMOVAL N/A 07/16/2017   Procedure: REMOVAL PORT-A-CATH;  Surgeon: [name], MD;  Location: [facility];  Service: General;  Laterality: N/A;   PORTA CATH REMOVAL  07/16/2017   PORTACATH PLACEMENT N/A 01/14/2017   Procedure: INSERTION  PORT-A-CATH WITH [place];  Surgeon: [name], MD;  Location: [place] [facility];  Service: General;  Laterality: N/A;   REMOVAL OF BILATERAL TISSUE EXPANDERS WITH PLACEMENT OF BILATERAL BREAST IMPLANTS Bilateral 06/25/2018   Procedure: REMOVAL OF BILATERAL TISSUE EXPANDERS WITH PLACEMENT OF BILATERAL SILICONE BREAST IMPLANTS;  Surgeon: Irene Limbo, MD;  Location: [facility];  Service: Plastics;  Laterality: Bilateral;   ROBOTIC ASSISTED TOTAL HYSTERECTOMY WITH BILATERAL SALPINGO OOPHERECTOMY Bilateral 11/03/2017   Procedure: XI ROBOTIC ASSISTED TOTAL HYSTERECTOMY WITH BILATERAL SALPINGO OOPHORECTOMY;  Surgeon: [name], MD;  Location: [facility];  Service: Gynecology;  Laterality: Bilateral;    I have reviewed the social history and family history with the patient and they are unchanged from previous note.  ALLERGIES:  is allergic to amoxicillin, other, chlorhexidine gluconate, and nicotine.  MEDICATIONS:  Current Outpatient Medications  Medication Sig Dispense Refill   cholecalciferol (VITAMIN D3) 25 MCG (1000 UNIT) tablet Take 1 tablet (1,000 Units total) by mouth daily. 30 tablet 6   Famotidine (PEPCID PO) Take 1 tablet by mouth daily as needed (acid reflux).     ibuprofen (ADVIL) 800 MG tablet Take 1 tablet (800 mg total) by mouth every 8 (eight) hours as needed for mild pain or moderate pain. 30 tablet 0  nystatin (MYCOSTATIN/NYSTOP) powder Apply to affected areas three times a day. 15 g 0   promethazine-dextromethorphan (PROMETHAZINE-DM) 6.25-15 MG/5ML syrup Take 5 mLs by mouth 4 (four) times daily as needed for cough. 118 mL 0   venlafaxine XR (EFFEXOR-XR) 150 MG 24 hr capsule Take 1 capsule (150 mg total) by mouth daily with breakfast. 30 capsule 5   No current facility-administered medications for this visit.    PHYSICAL EXAMINATION: ECOG PERFORMANCE STATUS: {CHL ONC ECOG [phone]}  There were no vitals filed for this visit. Wt Readings from Last 3 Encounters:  10/21/21 204 lb 14.4  oz (92.9 kg)  08/21/21 216 lb 11.2 oz (98.3 kg)  10/22/20 209 lb 7 oz (95 kg)    {Only keep what was examined. If exam not performed, can use .CEXAM } GENERAL:alert, no distress and comfortable SKIN: skin color, texture, turgor are normal, no rashes or significant lesions EYES: normal, Conjunctiva are pink and non-injected, sclera clear {OROPHARYNX:no exudate, no erythema and lips, buccal mucosa, and tongue normal}  NECK: supple, thyroid normal size, non-tender, without nodularity LYMPH:  no palpable lymphadenopathy in the cervical, axillary {or inguinal} LUNGS: clear to auscultation and percussion with normal breathing effort HEART: regular rate & rhythm and no murmurs and no lower extremity edema ABDOMEN:abdomen soft, non-tender and normal bowel sounds Musculoskeletal:no cyanosis of digits and no clubbing  NEURO: alert & oriented x 3 with fluent speech, no focal motor/sensory deficits  LABORATORY DATA:  I have reviewed the data as listed    Latest Ref Rng & Units 08/21/2021    9:59 AM 08/15/2020    8:25 AM 02/16/2020    8:24 AM  CBC  WBC 4.0 - 10.5 K/uL 6.2  5.9  5.7   Hemoglobin 12.0 - 15.0 g/dL 14.3  13.9  13.6   Hematocrit 36.0 - 46.0 % 41.8  41.1  40.3   Platelets 150 - 400 K/uL 259  229  232         Latest Ref Rng & Units 08/21/2021    9:59 AM 08/15/2020    8:25 AM 02/16/2020    8:24 AM  CMP  Glucose 70 - 99 mg/dL 108  98  97   BUN 6 - 20 mg/dL _0 Creatinine 0.44 - 1.00 mg/dL 0.86  1.11  0.83   Sodium 135 - 145 mmol/L 137  141  138   Potassium 3.5 - 5.1 mmol/L 3.8  3.8  4.0   Chloride 98 - 111 mmol/L 105  106  109   CO2 22 - 32 mmol/L _1 Calcium 8.9 - 10.3 mg/dL 9.3  9.2  9.1   Total Protein 6.5 - 8.1 g/dL 8.1  7.9  7.5   Total Bilirubin 0.3 - 1.2 mg/dL 0.3  0.2  0.4   Alkaline Phos 38 - 126 U/L 82  82  78   AST 15 - 41 U/L _2 ALT 0 - 44 U/L 37  29  21       RADIOGRAPHIC STUDIES: I have personally reviewed the radiological images as  listed and agreed with the findings in the report. No results found.    No orders of the defined types were placed in this encounter.  All questions were answered. The patient knows to call the clinic with any problems, questions or concerns. No barriers to learning was detected. The total time spent in the appointment was {CHL ONC  TIME VISIT - PRXYV:[id number]}.     Baldemar Friday, CMA 05/26/2022   I, Audry Riles, CMA, am acting as scribe for [name], MD.   {Add scribe attestation statement}

## 2022-05-27 ENCOUNTER — Inpatient Hospital Stay: Payer: Medicaid Other | Admitting: Hematology

## 2022-05-27 ENCOUNTER — Inpatient Hospital Stay: Payer: Medicaid Other | Attending: Student

## 2022-05-27 DIAGNOSIS — Z1501 Genetic susceptibility to malignant neoplasm of breast: Secondary | ICD-10-CM

## 2022-05-27 DIAGNOSIS — C50412 Malignant neoplasm of upper-outer quadrant of left female breast: Secondary | ICD-10-CM

## 2022-06-17 NOTE — Assessment & Plan Note (Deleted)
cT2N1M0, Stage IIIa, ER weakly 15%+, PR - , HER2 - , Grade 3, ypT1cN0M0, triple negative  -She was diagnosed in 12/2016. She is s/p neoadjuvant chemo, AC-CT, B/l mastectomy and reconstruction surgery, adjuvant radiation and adjuvant Xeloda.  -on surveillance. Her risk of recurrence is very low now since she is more than 5 years out of her initial diagnosis

## 2022-06-17 NOTE — Assessment & Plan Note (Deleted)
-  She underwent b/l mastectomy with Dr. Wakefield on 07/16/17 and underwent total hysterectomy and BSO with Dr. Rossi on 11/03/17.  -Her children will get tested when they are older.  

## 2022-06-18 ENCOUNTER — Inpatient Hospital Stay: Payer: Medicaid Other | Admitting: Hematology

## 2022-06-18 ENCOUNTER — Inpatient Hospital Stay: Payer: Medicaid Other | Attending: Hematology

## 2022-06-18 DIAGNOSIS — Z1501 Genetic susceptibility to malignant neoplasm of breast: Secondary | ICD-10-CM

## 2022-06-18 DIAGNOSIS — C50412 Malignant neoplasm of upper-outer quadrant of left female breast: Secondary | ICD-10-CM

## 2022-06-18 NOTE — Progress Notes (Deleted)
Kathryn Stanley   [phone] Fax:([phone]) [phone]   Clinic Follow up Note   Patient Care Team: Nani Gasser, MD as PCP - General [name], MD as Consulting Physician (General Surgery) [name], MD as Consulting Physician (Hematology) [name], MD as Consulting Physician (Radiation Oncology)  Date of Service:  06/18/2022  CHIEF COMPLAINT: f/u of  left breast cancer     CURRENT THERAPY:  Surveillance   ASSESSMENT: *** Kathryn Stanley is a 47 y.o. female with   No problem-specific Assessment & Plan notes found for this encounter.  ***   PLAN: {Everything Dr. Burr Medico talks to pt about, including reviewing scans and labs. } -{proceed with ***} -{lab with/without flush and f/u when?}   SUMMARY OF ONCOLOGIC HISTORY: Oncology History Overview Note  Cancer Staging Malignant neoplasm of upper-outer quadrant of left breast in female, estrogen receptor positive ([place]) Staging form: Breast, AJCC 8th Edition - Clinical stage from 12/29/2016: Stage IIIA (cT2, cN1, cM0, G3, ER: Positive, PR: Negative, HER2: Negative) - Signed by [name], MD on 01/07/2017 - Pathologic: No Stage Recommended (ypT1c, pN0, cM0, G2, ER-, PR-, HER2-) - Unsigned Cancer Staging Malignant neoplasm of upper-outer quadrant of left breast in female, estrogen receptor positive ([place]) Staging form: Breast, AJCC 8th Edition - Clinical stage from 12/29/2016: Stage IIIA (cT2, cN1, cM0, G3, ER: Positive, PR: Negative, HER2: Negative) - Signed by [name], MD on 01/07/2017 - Pathologic: No Stage Recommended (ypT1c, pN0, cM0, G2, ER-, PR-, HER2-) - Unsigned     Malignant neoplasm of upper-outer quadrant of left breast in female, estrogen receptor positive ([place])  12/25/2016 Mammogram   [place] and [facility] Diagnostic Breast Tomo Bilateral 12/25/16 IMPRESSION: 1. Suspicious mass 2.3 x 1.6 x 3.0 cm  in the 130 o'clock location of the left breast 8cm from the nipple, biopsy recommended. Adjacent simple  cyst is 1.9 cm. 2. Suspicious left axillary lymph node 2.2cm for which biopsy is indicated. 3. Indeterminate group of calcifications in the upper central portion of the left breast for which biopsy is recommended.    12/29/2016 Initial Biopsy   Diagnosis 12/29/16 1. Breast, left, needle core biopsy, stereotactic, upper inner quadrant - FIBROCYSTIC CHANGES INCLUDING APOCRINE METAPLASIA WITH CALCIFICATIONS - NO MALIGNANCY IDENTIFIED 2. Breast, left, needle core biopsy, ultrasound, 1:30 o'clock - INVASIVE MAMMARY CARCINOMA, G3 - SEE COMMENT 3. Lymph node, needle/core biopsy, ultrasound, left axillary - METASTIC CARCINOMA INVOLVING ONE LYMPH NODE (1/1) E-cadherin is positive supporting a ductal origin.    12/29/2016 Receptors her2   ER 15%+, weak staining  PR - HER2- Ki67 10%    12/29/2016 Initial Diagnosis   Malignant neoplasm of upper-outer quadrant of left breast in female, estrogen receptor positive ([place])   01/13/2017 Echocardiogram   ECHO 01/13/17 Study Conclusions  - Left ventricle: The cavity size was normal. Systolic function was   normal. The estimated ejection fraction was in the range of 60%   to 65%. Wall motion was normal; there were no regional wall   motion abnormalities. Left ventricular diastolic function   parameters were normal. - Mitral valve: There was trivial regurgitation. - Right atrium: The atrium was mildly dilated. - Tricuspid valve: There was trivial regurgitation.    01/14/2017 Surgery   Port placement by Dr. Donne Hazel    01/15/2017 Imaging   Whole Body bone Scan 01/15/17  IMPRESSION: 1.  No significant abnormality identified.   01/15/2017 Imaging   CT CAP W Contrast 01/15/17 IMPRESSION: 1. Left lateral breast mass with pathologic  left axillary adenopathy including a 2.0 cm left axillary lymph node. 2. Several hypodense lesions in the liver are likely benign lesions such as cysts. However, these are technically too small to characterize by CT.  Possibilities for further workup include surveillance or hepatic protocol MRI with and without contrast. 3. There is also a tiny hypodense lesion in the spleen which is technically nonspecific but statistically highly likely to be benign. 4.  Prominent stool throughout the colon favors constipation   01/16/2017 - 06/05/2017 Chemotherapy   Neoadjuvant chemo AC every 2 weeks for 4 cycles starting on 01/16/2017 and ended 02/27/17. Then proceed with weekly taxol for 12 treatments starting 03/13/17. Added Carboplatin AUC 2 on 04/03/17 to weekly taxol. Due to symptomatic thrombocytopenia we held Carbo since cycle 8. Cycle 9 was post-poned due to neutropenia and Granix was added starting 05/12/17. Added carbo back at reduced dose AUC 1.5 with cycle 10 on 05/22/17. 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   Analysis also detected a Variant of Uncertain Significance (VUS) in the NBN gene called c.628G>T (p.Val210Phe)  Update: The Variant of Uncertain Significance NBN c.628G>T (p.Val210Phe) has been reclassified to "Likely Benign." Report date is 04/09/2018.    06/12/2017 Imaging   MR BREAST BILATERAL WO CONTRAST IMPRESSION: 1. Significant positive response to neoadjuvant chemotherapy. The index carcinoma in the left breast has significantly decreased in size. Left axillary adenopathy has also significantly improved. 2. No other evidence of malignancy.   07/16/2017 Surgery   Pt underwent a bilateral mastectomy and left SLN biopsy with Dr. Donne Hazel   07/16/2017 Pathology Results   Diagnosis 07/16/17 1. Breast, simple mastectomy, Right Prophylactic - FIBROCYSTIC CHANGES INCLUDING APOCRINE METAPLASIA - DUCT ECTASIA - CALCIFICATIONS - NO MALIGNANCY IDENTIFIED 2. Lymph node, sentinel, biopsy, Left Axillary - NO CARCINOMA IDENTIFIED IN ONE LYMPH NODE (0/1) - SEE COMMENT 3.  Lymph node, sentinel, biopsy, Left - NO CARCINOMA IDENTIFIED IN ONE LYMPH NODE (0/1) - SEE COMMENT 4. Breast, simple mastectomy, Left - INVASIVE DUCTAL CARCINOMA, NOTTINGHAM GRADE 2 OF 3, 1.3 CM - MARGINS UNINVOLVED BY CARCINOMA (2 CM POSTERIOR MARGIN) - PREVIOUS BIOPSY SITE CHANGES - CALCIFICATIONS - SEE ONCOLOGY TABLE BELOW   07/16/2017 Receptors her2   Results: IMMUNOHISTOCHEMICAL AND MORPHOMETRIC ANALYSIS PERFORMED MANUALLY Estrogen Receptor: 0%, NEGATIVE Progesterone Receptor: 0%, NEGATIVE Results: HER2 - NEGATIVE RATIO OF HER2/CEP17 SIGNALS 1.73 AVERAGE HER2 COPY NUMBER PER CELL 2.67   09/02/2017 - 10/16/2017 Radiation Therapy   Adjuvant radiation per Dr. Lisbeth Nestle    11/03/2017 Surgery   XI ROBOTIC ASSISTED TOTAL HYSTERECTOMY WITH BILATERAL SALPINGO OOPHORECTOMY  by Dr. Denman George and Dr. Delsa Sale 11/03/17   11/23/2017 - 02/08/2018 Chemotherapy   Adjuvant Xeloda 2022m BID 2 weeks on and 1 week off   06/25/2018 Surgery   Bilateral breast reconstruction with Dr. TIran Planas 06/25/18       INTERVAL HISTORY: *** Kathryn Stanley is here for a follow up of  left breast cancer    She was last seen by PA-C IDede Queryon 08/21/21 She presents to the clinic       All other systems were reviewed with the patient and are negative.  MEDICAL HISTORY:  Past Medical History:  Diagnosis Date   Breast cancer, left breast ([place] dx[place] [id number]  Complication of anesthesia    Dermatologic problem    possible psoriasis. Pt has not had been  diagnosed by dermatology.   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   GERD (gastroesophageal reflux disease)    occational   PONV (postoperative nausea and vomiting)    Tobacco dependence    trying to quit with nicotene patches   UTI (urinary tract infection) 04/24/2017   Viral upper respiratory infection 03/11/2018    SURGICAL HISTORY: Past Surgical History:  Procedure  Laterality Date   APPLICATION OF A-CELL OF CHEST/ABDOMEN Right 06/25/2018   Procedure: ACELLULAR DERMIS TO RIGHT CHEST;  Surgeon: Irene Limbo, MD;  Location: [place];  Service: Plastics;  Laterality: Right;   BREAST BIOPSY Left 12/2016   BREAST IMPLANT EXCHANGE Right 06/24/2019   Procedure: REVISION RIGHT BREAST RECONSTRUCTION WITH SILICONE IMPLANT EXCAHNGE, STRATTICE TO RIGHT CHEST;  Surgeon: Irene Limbo, MD;  Location: [place];  Service: Plastics;  Laterality: Right;   BREAST RECONSTRUCTION Bilateral 07/16/2017   BILATERAL BREAST RECONSTRUCTION WITH PLACEMENT OF TISSUE EXPANDER AND ALLODERM/notes 07/16/2017   BREAST RECONSTRUCTION  06/25/2018   CESAREAN SECTION  2002   LATISSIMUS FLAP TO BREAST Left 06/25/2018   Procedure: LEFT LATISSIMUS FLAP TO LEFT CHEST;  Surgeon: Irene Limbo, MD;  Location: [place];  Service: Plastics;  Laterality: Left;   LIPOSUCTION Right 06/24/2019   Procedure: LIPOSUCTION RIGHT CHEST;  Surgeon: Irene Limbo, MD;  Location: [place];  Service: Plastics;  Laterality: Right;   MASTECTOMY Bilateral 07/16/2017   LEFT SKIN SPARING MASTECTOMY WITH LEFT RADIOACTIVE SEED TARGETED AXILLARY LYMPH NODE EXCISION AND AXILLARY SENTINEL LYMPH NODE BIOPSY; RIGHT PROPHYLACTIC SKIN SPARING MASTECTOMY [name] 07/16/2017   MASTECTOMY WITH RADIOACTIVE SEED GUIDED EXCISION AND AXILLARY SENTINEL LYMPH NODE BIOPSY Left 07/16/2017   Procedure: LEFT SKIN SPARING MASTECTOMY WITH LEFT RADIOACTIVE SEED TARGETED AXILLARY LYMPH NODE EXCISION AND AXILLARY SENTINEL LYMPH NODE BIOPSY;  Surgeon: [name], MD;  Location: [place];  Service: General;  Laterality: Left;   NIPPLE SPARING MASTECTOMY Right 07/16/2017   Procedure: RIGHT PROPHYLACTIC SKIN SPARING MASTECTOMY;  Surgeon: [name], MD;  Location: [place];  Service: General;  Laterality: Right;   PORT-A-CATH REMOVAL N/A 07/16/2017   Procedure: REMOVAL PORT-A-CATH;  Surgeon: [name],  MD;  Location: [place];  Service: General;  Laterality: N/A;   PORTA CATH REMOVAL  07/16/2017   PORTACATH PLACEMENT N/A 01/14/2017   Procedure: INSERTION PORT-A-CATH WITH [place];  Surgeon: [name], MD;  Location: [place];  Service: General;  Laterality: N/A;   REMOVAL OF BILATERAL TISSUE EXPANDERS WITH PLACEMENT OF BILATERAL BREAST IMPLANTS Bilateral 06/25/2018   Procedure: REMOVAL OF BILATERAL TISSUE EXPANDERS WITH PLACEMENT OF BILATERAL SILICONE BREAST IMPLANTS;  Surgeon: Irene Limbo, MD;  Location: [place];  Service: Plastics;  Laterality: Bilateral;   ROBOTIC ASSISTED TOTAL HYSTERECTOMY WITH BILATERAL SALPINGO OOPHERECTOMY Bilateral 11/03/2017   Procedure: XI ROBOTIC ASSISTED TOTAL HYSTERECTOMY WITH BILATERAL SALPINGO OOPHORECTOMY;  Surgeon: [name], MD;  Location: [facility];  Service: Gynecology;  Laterality: Bilateral;    I have reviewed the social history and family history with the patient and they are unchanged from previous note.  ALLERGIES:  is allergic to amoxicillin, other, chlorhexidine gluconate, and nicotine.  MEDICATIONS:  Current Outpatient Medications  Medication Sig Dispense Refill   cholecalciferol (VITAMIN D3) 25 MCG (1000 UNIT) tablet Take 1 tablet (1,000 Units total) by mouth daily. 30 tablet 6   Famotidine (PEPCID PO) Take 1 tablet by mouth daily as needed (acid reflux).     ibuprofen (ADVIL) 800  MG tablet Take 1 tablet (800 mg total) by mouth every 8 (eight) hours as needed for mild pain or moderate pain. 30 tablet 0   nystatin (MYCOSTATIN/NYSTOP) powder Apply to affected areas three times a day. 15 g 0   promethazine-dextromethorphan (PROMETHAZINE-DM) 6.25-15 MG/5ML syrup Take 5 mLs by mouth 4 (four) times daily as needed for cough. 118 mL 0   venlafaxine XR (EFFEXOR-XR) 150 MG 24 hr capsule Take 1 capsule (150 mg total) by mouth daily with breakfast. 30 capsule 5   No current facility-administered medications for this visit.    PHYSICAL EXAMINATION: ECOG  PERFORMANCE STATUS: {CHL ONC ECOG [phone]}  There were no vitals filed for this visit. Wt Readings from Last 3 Encounters:  10/21/21 204 lb 14.4 oz (92.9 kg)  08/21/21 216 lb 11.2 oz (98.3 kg)  10/22/20 209 lb 7 oz (95 kg)    {Only keep what was examined. If exam not performed, can use .CEXAM } GENERAL:alert, no distress and comfortable SKIN: skin color, texture, turgor are normal, no rashes or significant lesions EYES: normal, Conjunctiva are pink and non-injected, sclera clear {OROPHARYNX:no exudate, no erythema and lips, buccal mucosa, and tongue normal}  NECK: supple, thyroid normal size, non-tender, without nodularity LYMPH:  no palpable lymphadenopathy in the cervical, axillary {or inguinal} LUNGS: clear to auscultation and percussion with normal breathing effort HEART: regular rate & rhythm and no murmurs and no lower extremity edema ABDOMEN:abdomen soft, non-tender and normal bowel sounds Musculoskeletal:no cyanosis of digits and no clubbing  NEURO: alert & oriented x 3 with fluent speech, no focal motor/sensory deficits  LABORATORY DATA:  I have reviewed the data as listed    Latest Ref Rng & Units 08/21/2021    9:59 AM 08/15/2020    8:25 AM 02/16/2020    8:24 AM  CBC  WBC 4.0 - 10.5 K/uL 6.2  5.9  5.7   Hemoglobin 12.0 - 15.0 g/dL 14.3  13.9  13.6   Hematocrit 36.0 - 46.0 % 41.8  41.1  40.3   Platelets 150 - 400 K/uL 259  229  232         Latest Ref Rng & Units 08/21/2021    9:59 AM 08/15/2020    8:25 AM 02/16/2020    8:24 AM  CMP  Glucose 70 - 99 mg/dL 108  98  97   BUN 6 - 20 mg/dL 19  30  21   $ Creatinine 0.44 - 1.00 mg/dL 0.86  1.11  0.83   Sodium 135 - 145 mmol/L 137  141  138   Potassium 3.5 - 5.1 mmol/L 3.8  3.8  4.0   Chloride 98 - 111 mmol/L 105  106  109   CO2 22 - 32 mmol/L 25  22  21   $ Calcium 8.9 - 10.3 mg/dL 9.3  9.2  9.1   Total Protein 6.5 - 8.1 g/dL 8.1  7.9  7.5   Total Bilirubin 0.3 - 1.2 mg/dL 0.3  0.2  0.4   Alkaline Phos 38 - 126 U/L 82   82  78   AST 15 - 41 U/L 20  17  15   $ ALT 0 - 44 U/L 37  29  21       RADIOGRAPHIC STUDIES: I have personally reviewed the radiological images as listed and agreed with the findings in the report. No results found.    No orders of the defined types were placed in this encounter.  All questions were answered. The patient  knows to call the clinic with any problems, questions or concerns. No barriers to learning was detected. The total time spent in the appointment was {CHL ONC TIME VISIT - [id number].     Baldemar Friday, CMA 06/18/2022   I, Audry Riles, CMA, am acting as scribe for [name], MD.   {Add scribe attestation statement}

## 2022-06-19 ENCOUNTER — Telehealth: Payer: Self-pay | Admitting: Hematology

## 2022-06-19 NOTE — Telephone Encounter (Signed)
Per 1/9 IB, called pt multiple times to r/s, left message stating to call us back if they want to r/s before already sched appt in march

## 2022-06-23 ENCOUNTER — Encounter: Payer: Self-pay | Admitting: Hematology

## 2022-06-23 ENCOUNTER — Other Ambulatory Visit (HOSPITAL_COMMUNITY): Payer: Self-pay

## 2022-06-27 ENCOUNTER — Other Ambulatory Visit: Payer: Self-pay

## 2022-07-23 ENCOUNTER — Other Ambulatory Visit (HOSPITAL_COMMUNITY): Payer: Self-pay

## 2022-08-26 NOTE — Assessment & Plan Note (Signed)
cT2N1M0, Stage IIIa, ER weakly 15%+, PR - , HER2 - , Grade 3, ypT1cN0M0, triple negative  -She was diagnosed in 12/2016. She is s/p neoadjuvant chemo, AC-CT, B/l mastectomy and reconstruction surgery, adjuvant radiation and adjuvant Xeloda.  -Her tumor was weakly ER positive on initial biopsy, and became triple negative after neoadjuvant chemotherapy. I think the benefit of antiestrogen therapy is very small and I did not recommend it.   -She was not eligible for clinical trial for adjuvant immunotherapy Keytruda in triple negative cancer -She is clinically doing well.

## 2022-08-26 NOTE — Assessment & Plan Note (Signed)
-  She underwent b/l mastectomy with Dr. Wakefield on 07/16/17 and underwent total hysterectomy and BSO with Dr. Rossi on 11/03/17.  -Her children will get tested when they are older.  

## 2022-08-27 ENCOUNTER — Encounter: Payer: Self-pay | Admitting: Hematology

## 2022-08-27 ENCOUNTER — Inpatient Hospital Stay (HOSPITAL_BASED_OUTPATIENT_CLINIC_OR_DEPARTMENT_OTHER): Payer: Medicaid Other | Admitting: Hematology

## 2022-08-27 ENCOUNTER — Inpatient Hospital Stay: Payer: Medicaid Other | Attending: Hematology

## 2022-08-27 VITALS — BP 135/87 | HR 79 | Temp 97.7°F | Resp 18 | Ht 64.0 in | Wt 212.9 lb

## 2022-08-27 DIAGNOSIS — Z170 Estrogen receptor positive status [ER+]: Secondary | ICD-10-CM | POA: Diagnosis not present

## 2022-08-27 DIAGNOSIS — C50412 Malignant neoplasm of upper-outer quadrant of left female breast: Secondary | ICD-10-CM | POA: Diagnosis not present

## 2022-08-27 DIAGNOSIS — F32A Depression, unspecified: Secondary | ICD-10-CM

## 2022-08-27 DIAGNOSIS — Z853 Personal history of malignant neoplasm of breast: Secondary | ICD-10-CM | POA: Diagnosis not present

## 2022-08-27 DIAGNOSIS — Z1501 Genetic susceptibility to malignant neoplasm of breast: Secondary | ICD-10-CM | POA: Diagnosis not present

## 2022-08-27 DIAGNOSIS — R5383 Other fatigue: Secondary | ICD-10-CM | POA: Insufficient documentation

## 2022-08-27 DIAGNOSIS — Z9013 Acquired absence of bilateral breasts and nipples: Secondary | ICD-10-CM | POA: Insufficient documentation

## 2022-08-27 DIAGNOSIS — Z90710 Acquired absence of both cervix and uterus: Secondary | ICD-10-CM | POA: Insufficient documentation

## 2022-08-27 DIAGNOSIS — Z9221 Personal history of antineoplastic chemotherapy: Secondary | ICD-10-CM | POA: Diagnosis not present

## 2022-08-27 DIAGNOSIS — Z90722 Acquired absence of ovaries, bilateral: Secondary | ICD-10-CM | POA: Insufficient documentation

## 2022-08-27 DIAGNOSIS — Z1509 Genetic susceptibility to other malignant neoplasm: Secondary | ICD-10-CM

## 2022-08-27 LAB — CBC WITH DIFFERENTIAL (CANCER CENTER ONLY)
Abs Immature Granulocytes: 0.03 10*3/uL (ref 0.00–0.07)
Basophils Absolute: 0.1 10*3/uL (ref 0.0–0.1)
Basophils Relative: 1 %
Eosinophils Absolute: 0.3 10*3/uL (ref 0.0–0.5)
Eosinophils Relative: 4 %
HCT: 38.9 % (ref 36.0–46.0)
Hemoglobin: 13.5 g/dL (ref 12.0–15.0)
Immature Granulocytes: 0 %
Lymphocytes Relative: 32 %
Lymphs Abs: 2.5 10*3/uL (ref 0.7–4.0)
MCH: 29.4 pg (ref 26.0–34.0)
MCHC: 34.7 g/dL (ref 30.0–36.0)
MCV: 84.7 fL (ref 80.0–100.0)
Monocytes Absolute: 0.7 10*3/uL (ref 0.1–1.0)
Monocytes Relative: 9 %
Neutro Abs: 4.1 10*3/uL (ref 1.7–7.7)
Neutrophils Relative %: 54 %
Platelet Count: 247 10*3/uL (ref 150–400)
RBC: 4.59 MIL/uL (ref 3.87–5.11)
RDW: 12.9 % (ref 11.5–15.5)
WBC Count: 7.6 10*3/uL (ref 4.0–10.5)
nRBC: 0 % (ref 0.0–0.2)

## 2022-08-27 LAB — CMP (CANCER CENTER ONLY)
ALT: 24 U/L (ref 0–44)
AST: 16 U/L (ref 15–41)
Albumin: 4.1 g/dL (ref 3.5–5.0)
Alkaline Phosphatase: 65 U/L (ref 38–126)
Anion gap: 7 (ref 5–15)
BUN: 24 mg/dL — ABNORMAL HIGH (ref 6–20)
CO2: 28 mmol/L (ref 22–32)
Calcium: 8.9 mg/dL (ref 8.9–10.3)
Chloride: 105 mmol/L (ref 98–111)
Creatinine: 0.81 mg/dL (ref 0.44–1.00)
GFR, Estimated: >60 mL/min (ref >60)
Glucose, Bld: 107 mg/dL — ABNORMAL HIGH (ref 70–99)
Potassium: 4.2 mmol/L (ref 3.5–5.1)
Sodium: 140 mmol/L (ref 135–145)
Total Bilirubin: 0.3 mg/dL (ref 0.3–1.2)
Total Protein: 7.1 g/dL (ref 6.5–8.1)

## 2022-08-27 NOTE — Progress Notes (Signed)
[place]   [phone] [phone]   Clinic Follow up Note   Patient Care Team: Nani Gasser, MD as PCP - General [name], MD as Consulting Physician (General Surgery) [name], MD as Consulting Physician (Hematology) [name], MD as Consulting Physician (Radiation Oncology)  Date of Service:  08/27/2022  CHIEF COMPLAINT: f/u of left breast cancer    CURRENT THERAPY:  Surveillance   ASSESSMENT:  Kathryn Stanley is a 47 y.o. female with   Malignant neoplasm of upper-outer quadrant of left breast in female, estrogen receptor positive ([place]) cT2N1M0, Stage IIIa, ER weakly 15%+, PR - , HER2 - , Grade 3, ypT1cN0M0, triple negative  -She was diagnosed in 12/2016. She is s/p neoadjuvant chemo, AC-CT, B/l mastectomy and reconstruction surgery, adjuvant radiation and adjuvant Xeloda.  -Her tumor was weakly ER positive on initial biopsy, and became triple negative after neoadjuvant chemotherapy. I think the benefit of antiestrogen therapy is very small and I did not recommend it.   -She was not eligible for clinical trial for adjuvant immunotherapy Keytruda in triple negative cancer -She is clinically doing well.  Lab reviewed, exam was unremarkable, no clinical concern for recurrence.  BRCA1 gene mutation positive -She underwent b/l mastectomy with Dr. Donne Hazel on 07/16/17 and underwent total hysterectomy and BSO with Dr. Denman George on 11/03/17.  -Her children will get tested when they are older.    Depression and fatigue  -She reports moderate fatigue lately, and sleeps more than usual.  She also reports slightly worsening depression. -She does work over hours -Will refer her to psychology.  She will continue Effexor 150 mg daily  PLAN: -lab reviewed -Referral to psychology -lab,f/u in 1 year NP [place]: Oncology History Overview Note  Cancer Staging Malignant neoplasm of upper-outer quadrant of left  breast in female, estrogen receptor positive ([place]) Staging form: Breast, AJCC 8th Edition - Clinical stage from 12/29/2016: Stage IIIA (cT2, cN1, cM0, G3, ER: Positive, PR: Negative, HER2: Negative) - Signed by [name], MD on 01/07/2017 - Pathologic: No Stage Recommended (ypT1c, pN0, cM0, G2, ER-, PR-, HER2-) - Unsigned Cancer Staging Malignant neoplasm of upper-outer quadrant of left breast in female, estrogen receptor positive ([place]) Staging form: Breast, AJCC 8th Edition - Clinical stage from 12/29/2016: Stage IIIA (cT2, cN1, cM0, G3, ER: Positive, PR: Negative, HER2: Negative) - Signed by [name], MD on 01/07/2017 - Pathologic: No Stage Recommended (ypT1c, pN0, cM0, G2, ER-, PR-, HER2-) - Unsigned     Malignant neoplasm of upper-outer quadrant of left breast in female, estrogen receptor positive ([place])  12/25/2016 Mammogram   [place] and [facility] Diagnostic Breast Tomo Bilateral 12/25/16 IMPRESSION: 1. Suspicious mass 2.3 x 1.6 x 3.0 cm  in the 130 o'clock location of the left breast 8cm from the nipple, biopsy recommended. Adjacent simple cyst is 1.9 cm. 2. Suspicious left axillary lymph node 2.2cm for which biopsy is indicated. 3. Indeterminate group of calcifications in the upper central portion of the left breast for which biopsy is recommended.    12/29/2016 Initial Biopsy   Diagnosis 12/29/16 1. Breast, left, needle core biopsy, stereotactic, upper inner quadrant - FIBROCYSTIC CHANGES INCLUDING APOCRINE METAPLASIA WITH CALCIFICATIONS - NO MALIGNANCY IDENTIFIED 2. Breast, left, needle core biopsy, ultrasound, 1:30 o'clock - INVASIVE MAMMARY CARCINOMA, G3 - SEE COMMENT 3. Lymph node, needle/core biopsy, ultrasound, left axillary - METASTIC CARCINOMA INVOLVING ONE LYMPH NODE (1/1) E-cadherin is positive supporting a ductal origin.  12/29/2016 Receptors her2   ER 15%+, weak staining  PR - HER2- Ki67 10%    12/29/2016 Initial Diagnosis   Malignant neoplasm of upper-outer quadrant of left  breast in female, estrogen receptor positive ([place])   01/13/2017 Echocardiogram   ECHO 01/13/17 Study Conclusions  - Left ventricle: The cavity size was normal. Systolic function was   normal. The estimated ejection fraction was in the range of 60%   to 65%. Wall motion was normal; there were no regional wall   motion abnormalities. Left ventricular diastolic function   parameters were normal. - Mitral valve: There was trivial regurgitation. - Right atrium: The atrium was mildly dilated. - Tricuspid valve: There was trivial regurgitation.    01/14/2017 Surgery   Port placement by Dr. Donne Hazel    01/15/2017 Imaging   Whole Body bone Scan 01/15/17  IMPRESSION: 1.  No significant abnormality identified.   01/15/2017 Imaging   CT CAP W Contrast 01/15/17 IMPRESSION: 1. Left lateral breast mass with pathologic left axillary adenopathy including a 2.0 cm left axillary lymph node. 2. Several hypodense lesions in the liver are likely benign lesions such as cysts. However, these are technically too small to characterize by CT. Possibilities for further workup include surveillance or hepatic protocol MRI with and without contrast. 3. There is also a tiny hypodense lesion in the spleen which is technically nonspecific but statistically highly likely to be benign. 4.  Prominent stool throughout the colon favors constipation   01/16/2017 - 06/05/2017 Chemotherapy   Neoadjuvant chemo AC every 2 weeks for 4 cycles starting on 01/16/2017 and ended 02/27/17. Then proceed with weekly taxol for 12 treatments starting 03/13/17. Added Carboplatin AUC 2 on 04/03/17 to weekly taxol. Due to symptomatic thrombocytopenia we held Carbo since cycle 8. Cycle 9 was post-poned due to neutropenia and Granix was added starting 05/12/17. Added carbo back at reduced dose AUC 1.5 with cycle 10 on 05/22/17. 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   Analysis also detected a Variant of Uncertain Significance (VUS) in the NBN gene called c.628G>T (p.Val210Phe)  Update: The Variant of Uncertain Significance NBN c.628G>T (p.Val210Phe) has been reclassified to "Likely Benign." Report date is 04/09/2018.    06/12/2017 Imaging   MR BREAST BILATERAL WO CONTRAST IMPRESSION: 1. Significant positive response to neoadjuvant chemotherapy. The index carcinoma in the left breast has significantly decreased in size. Left axillary adenopathy has also significantly improved. 2. No other evidence of malignancy.   07/16/2017 Surgery   Pt underwent a bilateral mastectomy and left SLN biopsy with Dr. Donne Hazel   07/16/2017 Pathology Results   Diagnosis 07/16/17 1. Breast, simple mastectomy, Right Prophylactic - FIBROCYSTIC CHANGES INCLUDING APOCRINE METAPLASIA - DUCT ECTASIA - CALCIFICATIONS - NO MALIGNANCY IDENTIFIED 2. Lymph node, sentinel, biopsy, Left Axillary - NO CARCINOMA IDENTIFIED IN ONE LYMPH NODE (0/1) - SEE COMMENT 3. Lymph node, sentinel, biopsy, Left - NO CARCINOMA IDENTIFIED IN ONE LYMPH NODE (0/1) - SEE COMMENT 4. Breast, simple mastectomy, Left - INVASIVE DUCTAL CARCINOMA, NOTTINGHAM GRADE 2 OF 3, 1.3 CM - MARGINS UNINVOLVED BY CARCINOMA (2 CM POSTERIOR MARGIN) - PREVIOUS BIOPSY SITE CHANGES - CALCIFICATIONS - SEE ONCOLOGY TABLE BELOW   07/16/2017 Receptors her2   Results: IMMUNOHISTOCHEMICAL AND MORPHOMETRIC ANALYSIS PERFORMED MANUALLY Estrogen Receptor: 0%, NEGATIVE Progesterone Receptor: 0%, NEGATIVE Results: HER2 - NEGATIVE RATIO OF HER2/CEP17 SIGNALS 1.73 AVERAGE HER2 COPY NUMBER  PER CELL 2.67   09/02/2017 - 10/16/2017 Radiation Therapy   Adjuvant radiation per Dr. Lisbeth Arntz    11/03/2017 Surgery   XI ROBOTIC ASSISTED TOTAL HYSTERECTOMY WITH BILATERAL SALPINGO OOPHORECTOMY  by Dr. Denman George and Dr. Delsa Sale 11/03/17   11/23/2017 - 02/08/2018 Chemotherapy   Adjuvant  Xeloda 2000mg  BID 2 weeks on and 1 week off   06/25/2018 Surgery   Bilateral breast reconstruction with Dr. Iran Planas  06/25/18       INTERVAL HISTORY:  Kathryn Stanley is here for a follow up of left breast cancer   She was last seen by Terisa Starr on 08/22/2021 She presents to the clinic  Accompanied by husband. Pt stating she has been feeling lethargic. Pt state she has been working a lot. Pt denies having any pain. Pt verbalize she has feelings of depression and she has notice a decline in having interest in doing things.    All other systems were reviewed with the patient and are negative.  MEDICAL HISTORY:  Past Medical History:  Diagnosis Date   Breast cancer, left breast ([place]) dx'd [id number]   Complication of anesthesia    Dermatologic problem    possible psoriasis. Pt has not had been diagnosed by dermatology.   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   GERD (gastroesophageal reflux disease)    occational   PONV (postoperative nausea and vomiting)    Tobacco dependence    trying to quit with nicotene patches   UTI (urinary tract infection) 04/24/2017   Viral upper respiratory infection 03/11/2018    SURGICAL HISTORY: Past Surgical History:  Procedure Laterality Date   APPLICATION OF A-CELL OF CHEST/ABDOMEN Right 06/25/2018   Procedure: ACELLULAR DERMIS TO RIGHT CHEST;  Surgeon: Irene Limbo, MD;  Location: [facility];  Service: Plastics;  Laterality: Right;   BREAST BIOPSY Left 12/2016   BREAST IMPLANT EXCHANGE Right 06/24/2019   Procedure: REVISION RIGHT BREAST RECONSTRUCTION WITH SILICONE IMPLANT EXCAHNGE, STRATTICE TO RIGHT CHEST;  Surgeon: Irene Limbo, MD;  Location: [facility];  Service: Plastics;  Laterality: Right;   BREAST RECONSTRUCTION Bilateral 07/16/2017   BILATERAL BREAST RECONSTRUCTION WITH PLACEMENT OF TISSUE EXPANDER AND ALLODERM/notes 07/16/2017   BREAST RECONSTRUCTION   06/25/2018   CESAREAN SECTION  2002   LATISSIMUS FLAP TO BREAST Left 06/25/2018   Procedure: LEFT LATISSIMUS FLAP TO LEFT CHEST;  Surgeon: Irene Limbo, MD;  Location: [facility];  Service: Plastics;  Laterality: Left;   LIPOSUCTION Right 06/24/2019   Procedure: LIPOSUCTION RIGHT CHEST;  Surgeon: Irene Limbo, MD;  Location: [facility];  Service: Plastics;  Laterality: Right;   MASTECTOMY Bilateral 07/16/2017   LEFT SKIN SPARING MASTECTOMY WITH LEFT RADIOACTIVE SEED TARGETED AXILLARY LYMPH NODE EXCISION AND AXILLARY SENTINEL LYMPH NODE BIOPSY; RIGHT PROPHYLACTIC SKIN SPARING MASTECTOMY [name] 07/16/2017   MASTECTOMY WITH RADIOACTIVE SEED GUIDED EXCISION AND AXILLARY SENTINEL LYMPH NODE BIOPSY Left 07/16/2017   Procedure: LEFT SKIN SPARING MASTECTOMY WITH LEFT RADIOACTIVE SEED TARGETED AXILLARY LYMPH NODE EXCISION AND AXILLARY SENTINEL LYMPH NODE BIOPSY;  Surgeon: [name], MD;  Location: [facility];  Service: General;  Laterality: Left;   NIPPLE SPARING MASTECTOMY Right 07/16/2017   Procedure: RIGHT PROPHYLACTIC SKIN SPARING MASTECTOMY;  Surgeon: [name], MD;  Location: [facility];  Service: General;  Laterality: Right;   PORT-A-CATH REMOVAL N/A 07/16/2017   Procedure: REMOVAL PORT-A-CATH;  Surgeon: [name], MD;  Location: [facility];  Service: General;  Laterality: N/A;  PORTA CATH REMOVAL  07/16/2017   PORTACATH PLACEMENT N/A 01/14/2017   Procedure: INSERTION PORT-A-CATH WITH [place];  Surgeon: [name], MD;  Location: [facility];  Service: General;  Laterality: N/A;   REMOVAL OF BILATERAL TISSUE EXPANDERS WITH PLACEMENT OF BILATERAL BREAST IMPLANTS Bilateral 06/25/2018   Procedure: REMOVAL OF BILATERAL TISSUE EXPANDERS WITH PLACEMENT OF BILATERAL SILICONE BREAST IMPLANTS;  Surgeon: Irene Limbo, MD;  Location: [facility];  Service: Plastics;  Laterality: Bilateral;   ROBOTIC ASSISTED TOTAL HYSTERECTOMY WITH BILATERAL SALPINGO OOPHERECTOMY Bilateral 11/03/2017    Procedure: XI ROBOTIC ASSISTED TOTAL HYSTERECTOMY WITH BILATERAL SALPINGO OOPHORECTOMY;  Surgeon: [name], MD;  Location: [facility];  Service: Gynecology;  Laterality: Bilateral;    I have reviewed the social history and family history with the patient and they are unchanged from previous note.  ALLERGIES:  is allergic to amoxicillin, other, chlorhexidine gluconate, and nicotine.  MEDICATIONS:  Current Outpatient Medications  Medication Sig Dispense Refill   cholecalciferol (VITAMIN D3) 25 MCG (1000 UNIT) tablet Take 1 tablet (1,000 Units total) by mouth daily. 30 tablet 6   Famotidine (PEPCID PO) Take 1 tablet by mouth daily as needed (acid reflux).     ibuprofen (ADVIL) 800 MG tablet Take 1 tablet (800 mg total) by mouth every 8 (eight) hours as needed for mild pain or moderate pain. 30 tablet 0   nystatin (MYCOSTATIN/NYSTOP) powder Apply to affected areas three times a day. 15 g 0   promethazine-dextromethorphan (PROMETHAZINE-DM) 6.25-15 MG/5ML syrup Take 5 mLs by mouth 4 (four) times daily as needed for cough. 118 mL 0   venlafaxine XR (EFFEXOR-XR) 150 MG 24 hr capsule Take 1 capsule (150 mg total) by mouth daily with breakfast. 30 capsule 5   No current facility-administered medications for this visit.    PHYSICAL EXAMINATION: ECOG PERFORMANCE STATUS: 1 - Symptomatic but completely ambulatory  Vitals:   08/27/22 1036  BP: 135/87  Pulse: 79  Resp: 18  Temp: 97.7 F (36.5 C)  SpO2: 100%   Wt Readings from Last 3 Encounters:  08/27/22 212 lb 14.4 oz (96.6 kg)  10/21/21 204 lb 14.4 oz (92.9 kg)  08/21/21 216 lb 11.2 oz (98.3 kg)     LYMPH:  (-) no palpable lymphadenopathy in the cervical, axillary   ABDOMEN:(-) abdomen soft, non-tender and normal bowel sounds  BREAST:  Bilateral Mastectomy Rt breast no palpable mass  Lt Breast is dark in colo from radiation.No palpable mass    LABORATORY DATA:  I have reviewed the data as listed    Latest Ref Rng & Units 08/27/2022    10:10 AM 08/21/2021    9:59 AM 08/15/2020    8:25 AM  CBC  WBC 4.0 - 10.5 K/uL 7.6  6.2  5.9   Hemoglobin 12.0 - 15.0 g/dL 13.5  14.3  13.9   Hematocrit 36.0 - 46.0 % 38.9  41.8  41.1   Platelets 150 - 400 K/uL 247  259  229         Latest Ref Rng & Units 08/27/2022   10:10 AM 08/21/2021    9:59 AM 08/15/2020    8:25 AM  CMP  Glucose 70 - 99 mg/dL 107  108  98   BUN 6 - 20 mg/dL 24  19  30    Creatinine 0.44 - 1.00 mg/dL 0.81  0.86  1.11   Sodium 135 - 145 mmol/L 140  137  141   Potassium 3.5 - 5.1 mmol/L 4.2  3.8  3.8  Chloride 98 - 111 mmol/L 105  105  106   CO2 22 - 32 mmol/L 28  25  22    Calcium 8.9 - 10.3 mg/dL 8.9  9.3  9.2   Total Protein 6.5 - 8.1 g/dL 7.1  8.1  7.9   Total Bilirubin 0.3 - 1.2 mg/dL 0.3  0.3  0.2   Alkaline Phos 38 - 126 U/L 65  82  82   AST 15 - 41 U/L 16  20  17    ALT 0 - 44 U/L 24  37  29       RADIOGRAPHIC STUDIES: I have personally reviewed the radiological images as listed and agreed with the findings in the report. No results found.    Orders Placed This Encounter  Procedures   Ambulatory referral to Psychology    Referral Priority:   Routine    Referral Type:   Psychiatric    Referral Reason:   Specialty Services Required    Requested Specialty:   Psychology    Number of Visits Requested:   1   All questions were answered. The patient knows to call the clinic with any problems, questions or concerns. No barriers to learning was detected. The total time spent in the appointment was 25 minutes.     [name], MD 08/27/2022   Felicity Coyer, CMA, am acting as scribe for [name], MD.   I have reviewed the above documentation for accuracy and completeness, and I agree with the above.

## 2022-11-20 ENCOUNTER — Other Ambulatory Visit (HOSPITAL_COMMUNITY): Payer: Self-pay

## 2022-11-20 ENCOUNTER — Other Ambulatory Visit: Payer: Self-pay | Admitting: Hematology

## 2022-11-20 MED ORDER — VENLAFAXINE HCL ER 150 MG PO CP24
150.0000 mg | ORAL_CAPSULE | Freq: Every day | ORAL | 5 refills | Status: DC
  Filled 2022-11-20: qty 30, 30d supply, fill #0
  Filled 2022-12-22: qty 30, 30d supply, fill #1
  Filled 2023-01-22: qty 30, 30d supply, fill #2
  Filled 2023-02-23: qty 30, 30d supply, fill #3
  Filled 2023-03-25: qty 30, 30d supply, fill #4
  Filled 2023-04-23: qty 30, 30d supply, fill #5

## 2023-01-04 IMAGING — US US AXILLARY RIGHT
1 series · 2 of 2 positions shown · non-contrast
Comparison: Previous exam(s).

CLINICAL DATA: History of LEFT mastectomy and axillary dissection
for LEFT breast cancer in Monday July, 2017. At the same time,
patient had prophylactic RIGHT mastectomy. Patient is having pain in
the RIGHT UPPER arm and shoulder. Evaluate the RIGHT axilla.

EXAM:
ULTRASOUND OF THE RIGHT AXILLA

[Series 1: us axillary right · 0.07mm/px · 2 of 2 slices shown]
[im 1/2]
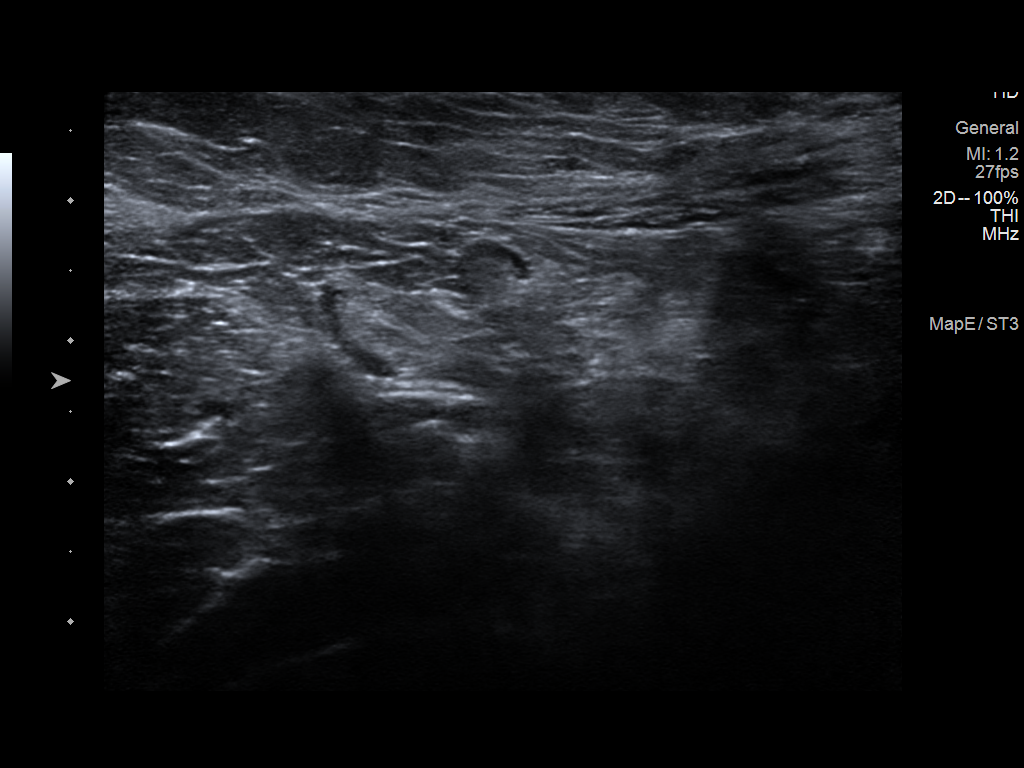
[im 2/2]
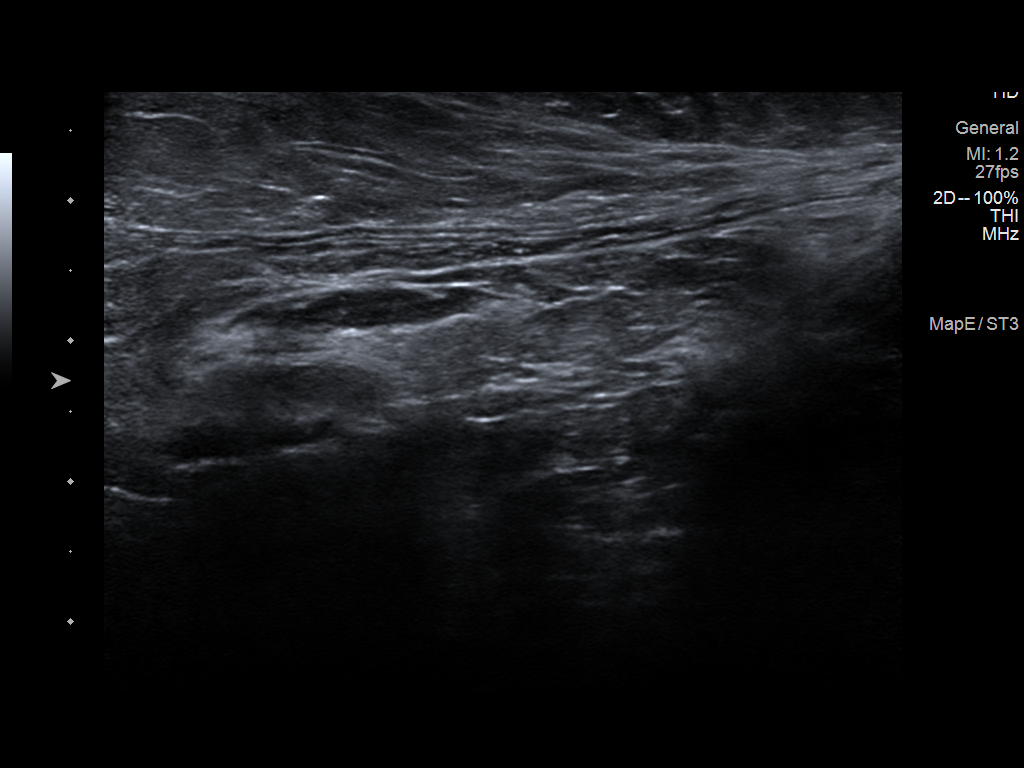

[2 of 2 positions shown; findings below may reference images not displayed]

FINDINGS: On physical exam,I palpate no abnormality in the RIGHT axilla. There
are well-healed RIGHT mastectomy scars.

Ultrasound is performed, showing normal axillary contents.
Visualized lymph nodes have normal morphology. No suspicious mass,
distortion, or acoustic shadowing is demonstrated with ultrasound.
IMPRESSION: There is no sonographic evidence for malignancy.

RECOMMENDATION:
Clinical followup.

I have discussed the findings and recommendations with the patient.
If applicable, a reminder letter will be sent to the patient
regarding the next appointment.

BI-RADS CATEGORY  1: Negative.

## 2023-01-14 ENCOUNTER — Emergency Department (HOSPITAL_BASED_OUTPATIENT_CLINIC_OR_DEPARTMENT_OTHER)
Admission: EM | Admit: 2023-01-14 | Discharge: 2023-01-15 | Disposition: A | Discharge status: Home / Self Care | Payer: Medicaid Other | Attending: Emergency Medicine | Admitting: Emergency Medicine

## 2023-01-14 ENCOUNTER — Encounter (HOSPITAL_BASED_OUTPATIENT_CLINIC_OR_DEPARTMENT_OTHER): Payer: Self-pay

## 2023-01-14 DIAGNOSIS — R7401 Elevation of levels of liver transaminase levels: Secondary | ICD-10-CM | POA: Diagnosis not present

## 2023-01-14 DIAGNOSIS — Z853 Personal history of malignant neoplasm of breast: Secondary | ICD-10-CM | POA: Diagnosis not present

## 2023-01-14 DIAGNOSIS — K8020 Calculus of gallbladder without cholecystitis without obstruction: Secondary | ICD-10-CM | POA: Insufficient documentation

## 2023-01-14 DIAGNOSIS — R739 Hyperglycemia, unspecified: Secondary | ICD-10-CM | POA: Insufficient documentation

## 2023-01-14 DIAGNOSIS — R1010 Upper abdominal pain, unspecified: Secondary | ICD-10-CM | POA: Diagnosis present

## 2023-01-14 LAB — COMPREHENSIVE METABOLIC PANEL
ALT: 111 U/L — ABNORMAL HIGH (ref 0–44)
AST: 106 U/L — ABNORMAL HIGH (ref 15–41)
Albumin: 4.1 g/dL (ref 3.5–5.0)
Alkaline Phosphatase: 73 U/L (ref 38–126)
Anion gap: 10 (ref 5–15)
BUN: 17 mg/dL (ref 6–20)
CO2: 25 mmol/L (ref 22–32)
Calcium: 9.1 mg/dL (ref 8.9–10.3)
Chloride: 103 mmol/L (ref 98–111)
Creatinine, Ser: 0.91 mg/dL (ref 0.44–1.00)
GFR, Estimated: >60 mL/min (ref >60)
Glucose, Bld: 146 mg/dL — ABNORMAL HIGH (ref 70–99)
Potassium: 4 mmol/L (ref 3.5–5.1)
Sodium: 138 mmol/L (ref 135–145)
Total Bilirubin: 0.4 mg/dL (ref 0.3–1.2)
Total Protein: 7.3 g/dL (ref 6.5–8.1)

## 2023-01-14 LAB — LIPASE, BLOOD: Lipase: 34 U/L (ref 11–51)

## 2023-01-14 LAB — URINALYSIS, ROUTINE W REFLEX MICROSCOPIC
Bilirubin Urine: NEGATIVE
Glucose, UA: NEGATIVE mg/dL
Hgb urine dipstick: NEGATIVE
Ketones, ur: NEGATIVE mg/dL
Leukocytes,Ua: NEGATIVE
Nitrite: NEGATIVE
Protein, ur: NEGATIVE mg/dL
Specific Gravity, Urine: 1.019 (ref 1.005–1.030)
pH: 8 (ref 5.0–8.0)

## 2023-01-14 LAB — CBC
HCT: 40.4 % (ref 36.0–46.0)
Hemoglobin: 14 g/dL (ref 12.0–15.0)
MCH: 29.3 pg (ref 26.0–34.0)
MCHC: 34.7 g/dL (ref 30.0–36.0)
MCV: 84.5 fL (ref 80.0–100.0)
Platelets: 251 10*3/uL (ref 150–400)
RBC: 4.78 MIL/uL (ref 3.87–5.11)
RDW: 12.5 % (ref 11.5–15.5)
WBC: 7.9 10*3/uL (ref 4.0–10.5)
nRBC: 0 % (ref 0.0–0.2)

## 2023-01-14 NOTE — ED Triage Notes (Signed)
Pt arrived POV for abd pain that started today, same symptoms 2 months app. Pt reports "it feels like my belly seizes up, come and go". Pt reports nausea. Denies v/d. Denies urinary  symptoms. VSS, NAD noted A&Ox4.

## 2023-01-15 ENCOUNTER — Emergency Department (HOSPITAL_BASED_OUTPATIENT_CLINIC_OR_DEPARTMENT_OTHER): Payer: Medicaid Other

## 2023-01-15 NOTE — ED Provider Notes (Signed)
[facility] EMERGENCY DEPARTMENT AT [facility] Provider Note   CSN: [id number] Arrival date & time: 01/14/23  2126     History  Chief Complaint  Patient presents with   Abdominal Pain    Kathryn Stanley is a 47 y.o. female.  The history is provided by the patient.   Patient presents with abdominal pain.  Patient reports she has had pain in her mid upper abdomen that radiates to both upper quadrants.  She reports nausea without vomiting.  No diarrhea or constipation.  No bloody or black stools.  No previous abdominal surgeries.  No urinary symptoms.  No chest pain or shortness of breath.  Her pain is somewhat improved at this time.  However she has had this previously  Previous history of breast cancer that has been in remission    Home Medications Prior to Admission medications   Medication Sig Start Date End Date Taking? Authorizing Provider  cholecalciferol (VITAMIN D3) 25 MCG (1000 UNIT) tablet Take 1 tablet (1,000 Units total) by mouth daily. 08/22/21   Georga Kaufmann T, PA-C  nystatin (MYCOSTATIN/NYSTOP) powder Apply to affected areas three times a day. 08/21/21   Briant Cedar, PA-C  venlafaxine XR (EFFEXOR-XR) 150 MG 24 hr capsule Take 1 capsule (150 mg total) by mouth daily with breakfast. 11/20/22   Malachy Mood, MD      Allergies    Amoxicillin, Other, Chlorhexidine gluconate, and Nicotine    Review of Systems   Review of Systems  Cardiovascular:  Negative for chest pain.  Gastrointestinal:  Positive for abdominal pain and nausea. Negative for blood in stool, constipation, diarrhea and vomiting.    Physical Exam Updated Vital Signs BP 117/71   Pulse 77   Temp 98.2 F (36.8 C) (Oral)   Resp 16   Ht 1.626 m (5\' 4" )   Wt 99.8 kg   LMP 01/07/2017 (Approximate)   SpO2 96%   BMI 37.76 kg/m  Physical Exam CONSTITUTIONAL: Well developed/well nourished HEAD: Normocephalic/atraumatic EYES: EOMI/PERRL, no icterus ENMT: Mucous membranes moist NECK: supple  no meningeal signs CV: S1/S2 noted, no murmurs/rubs/gallops noted LUNGS: Lungs are clear to auscultation bilaterally, no apparent distress ABDOMEN: soft, nontender, no rebound or guarding, bowel sounds noted throughout abdomen GU:no cva tenderness NEURO: Pt is awake/alert/appropriate, moves all extremitiesx4.  No facial droop.   EXTREMITIES: pulses normal/equal, full ROM SKIN: warm, color normal PSYCH: no abnormalities of mood noted, alert and oriented to situation  ED Results / Procedures / Treatments   Labs (all labs ordered are listed, but only abnormal results are displayed) Labs Reviewed  COMPREHENSIVE METABOLIC PANEL - Abnormal; Notable for the following components:      Result Value   Glucose, Bld 146 (*)    AST 106 (*)    ALT 111 (*)    All other components within normal limits  LIPASE, BLOOD  CBC  URINALYSIS, ROUTINE W REFLEX MICROSCOPIC    EKG EKG Interpretation Date/Time:  Thursday January 15 2023 01:15:16 EDT Ventricular Rate:  80 PR Interval:  119 QRS Duration:  89 QT Interval:  388 QTC Calculation: 448 R Axis:   39  Text Interpretation: Sinus rhythm Borderline short PR interval Abnormal R-wave progression, early transition No previous ECGs available Confirmed by Zadie Rhine ([id number]) on 01/15/2023 1:17:25 AM  Radiology US Abdomen Limited RUQ (LIVER/GB)  Result Date: 01/15/2023 CLINICAL DATA:  Right upper quadrant abdominal pain EXAM: ULTRASOUND ABDOMEN LIMITED RIGHT UPPER QUADRANT COMPARISON:  None Available. FINDINGS: Gallbladder: Multiple gallstones are  seen within the gallbladder. The gallbladder, however, is contracted, there is no gallbladder wall thickening, and no pericholecystic fluid is identified. The sonographic Eulah Pont sign is reportedly negative. Common bile duct: Diameter: 6 mm in proximal diameter Liver: Hepatic parenchymal echogenicity is diffusely increased and there is poor acoustic through transmission in keeping with moderate to severe hepatic  steatosis. No focal intrahepatic masses are seen and there is no intrahepatic biliary ductal dilation. Portal vein is patent on color Doppler imaging with normal direction of blood flow towards the liver. Other: Visualized mid body of the pancreas is unremarkable. IMPRESSION: 1. Cholelithiasis without sonographic evidence of acute cholecystitis. 2. Moderate to severe hepatic steatosis. Electronically Signed   By: Helyn Numbers M.D.   On: 01/15/2023 01:32    Procedures Procedures    Medications Ordered in ED Medications - No data to display  ED Course/ Medical Decision Making/ A&P Clinical Course as of 01/15/23 0151  Thu Jan 15, 2023  0038 Glucose(!): 146 Mild hyperglycemia [DW]  0038 ALT(!): 111 Mild transaminitis [DW]  0150 Patient with right upper quadrant abdominal pain.  Ultrasound imaging reveals cholelithiasis without complicating features.  Patient is currently pain-free.  She had mild transaminitis but otherwise no findings.  She is safe for discharge home.  We discussed need for surgery follow-up and strict return precautions [DW]    Clinical Course User Index [DW] Zadie Rhine, MD                                 Medical Decision Making Amount and/or Complexity of Data Reviewed Labs: ordered. Decision-making details documented in ED Course. Radiology: ordered. ECG/medicine tests: ordered.   This patient presents to the ED for concern of abdominal pain, this involves an extensive number of treatment options, and is a complaint that carries with it a high risk of complications and morbidity.  The differential diagnosis includes but is not limited to cholecystitis, cholelithiasis, pancreatitis, gastritis, peptic ulcer disease, appendicitis, bowel obstruction, bowel perforation, diverticulitis, AAA, ischemic bowel, acute coronary syndrome    Comorbidities that complicate the patient evaluation: Patient's presentation is complicated by their history of breast  cancer   Additional history obtained: Additional history obtained from spouse Records reviewed  outpatient records reviewed  Lab Tests: I Ordered, and personally interpreted labs.  The pertinent results include: Mild hyperglycemia, transaminitis  Imaging Studies ordered: I ordered imaging studies including Ultrasound right upper quadrant   I independently visualized and interpreted imaging which showed cholelithiasis I agree with the radiologist interpretation   Reevaluation: After the interventions noted above, I reevaluated the patient and found that they have :improved  Complexity of problems addressed: Patient's presentation is most consistent with  acute presentation with potential threat to life or bodily function  Disposition: After consideration of the diagnostic results and the patient's response to treatment,  I feel that the patent would benefit from discharge   .           Final Clinical Impression(s) / ED Diagnoses Final diagnoses:  Calculus of gallbladder without cholecystitis without obstruction    Rx / DC Orders ED Discharge Orders     None         Zadie Rhine, MD 01/15/23 [id number]

## 2023-01-23 ENCOUNTER — Encounter: Payer: Self-pay | Admitting: Student

## 2023-01-23 ENCOUNTER — Other Ambulatory Visit (HOSPITAL_COMMUNITY): Payer: Self-pay

## 2023-01-26 ENCOUNTER — Other Ambulatory Visit: Payer: Self-pay | Admitting: Surgery

## 2023-01-30 NOTE — Pre-Procedure Instructions (Signed)
Surgical Instructions   Your procedure is scheduled on Thursday, August 29th. Report to [facility] Main Entrance "A" at 09:30 A.M., then check in with the Admitting office. Any questions or running late day of surgery: call [phone]  Questions prior to your surgery date: call [phone], Monday-Friday, 8am-4pm. If you experience any cold or flu symptoms such as cough, fever, chills, shortness of breath, etc. between now and your scheduled surgery, please notify us at the above number.     Remember:  Do not eat after midnight the night before your surgery   You may drink clear liquids until 08:30 AM the morning of your surgery.   Clear liquids allowed are: Water, Non-Citrus Juices (without pulp), Carbonated Beverages, Clear Tea, Black Coffee Only (NO MILK, CREAM OR POWDERED CREAMER of any kind), and Gatorade.  Patient Instructions  The night before surgery:  No food after midnight. ONLY clear liquids after midnight  The day of surgery (if you do NOT have diabetes):  Drink ONE (1) Pre-Surgery Clear Ensure by 08:30 AM the morning of surgery. Drink in one sitting. Do not sip.  This drink was given to you during your hospital  pre-op appointment visit.  Nothing else to drink after completing the  Pre-Surgery Clear Ensure.          If you have questions, please contact your surgeon's office.    Take these medicines the morning of surgery with A SIP OF WATER  venlafaxine XR (EFFEXOR-XR)     One week prior to surgery, STOP taking any Aspirin (unless otherwise instructed by your surgeon) Aleve, Naproxen, Ibuprofen, Motrin, Advil, Goody's, BC's, all herbal medications, fish oil, and non-prescription vitamins.                     Do NOT Smoke (Tobacco/Vaping) for 24 hours prior to your procedure.  If you use a CPAP at night, you may bring your mask/headgear for your overnight stay.   You will be asked to remove any contacts, glasses, piercing's, hearing aid's,  dentures/partials prior to surgery. Please bring cases for these items if needed.    Patients discharged the day of surgery will not be allowed to drive home, and someone needs to stay with them for 24 hours.  SURGICAL WAITING ROOM VISITATION Patients may have no more than 2 support people in the waiting area - these visitors may rotate.   Pre-op nurse will coordinate an appropriate time for 1 ADULT support person, who may not rotate, to accompany patient in pre-op.  Children under the age of 65 must have an adult with them who is not the patient and must remain in the main waiting area with an adult.  If the patient needs to stay at the hospital during part of their recovery, the visitor guidelines for inpatient rooms apply.  Please refer to the [facility] website for the visitor guidelines for any additional information.   If you received a COVID test during your pre-op visit  it is requested that you wear a mask when out in public, stay away from anyone that may not be feeling well and notify your surgeon if you develop symptoms. If you have been in contact with anyone that has tested positive in the last 10 days please notify you surgeon.      Pre-operative CHG Bathing Instructions   You can play a key role in reducing the risk of infection after surgery. Your skin needs to be as free of germs as  possible. You can reduce the number of germs on your skin by washing with CHG (chlorhexidine gluconate) soap before surgery. CHG is an antiseptic soap that kills germs and continues to kill germs even after washing.   DO NOT use if you have an allergy to chlorhexidine/CHG or antibacterial soaps. If your skin becomes reddened or irritated, stop using the CHG and notify one of our RNs at [phone].              TAKE A SHOWER THE NIGHT BEFORE SURGERY AND THE DAY OF SURGERY    Please keep in mind the following:  DO NOT shave, including legs and underarms, 48 hours prior to surgery.   You may  shave your face before/day of surgery.  Place clean sheets on your bed the night before surgery Use a clean washcloth (not used since being washed) for each shower. DO NOT sleep with pet's night before surgery.  CHG Shower Instructions:  If you choose to wash your hair and private area, wash first with your normal shampoo/soap.  After you use shampoo/soap, rinse your hair and body thoroughly to remove shampoo/soap residue.  Turn the water OFF and apply half the bottle of CHG soap to a CLEAN washcloth.  Apply CHG soap ONLY FROM YOUR NECK DOWN TO YOUR TOES (washing for 3-5 minutes)  DO NOT use CHG soap on face, private areas, open wounds, or sores.  Pay special attention to the area where your surgery is being performed.  If you are having back surgery, having someone wash your back for you may be helpful. Wait 2 minutes after CHG soap is applied, then you may rinse off the CHG soap.  Pat dry with a clean towel  Put on clean pajamas    Additional instructions for the day of surgery: DO NOT APPLY any lotions, deodorants, cologne, or perfumes.   Do not wear jewelry or makeup Do not wear nail polish, gel polish, artificial nails, or any other type of covering on natural nails (fingers and toes) Do not bring valuables to the hospital. [facility] is not responsible for valuables/personal belongings. Put on clean/comfortable clothes.  Please brush your teeth.  Ask your nurse before applying any prescription medications to the skin.

## 2023-02-02 ENCOUNTER — Encounter (HOSPITAL_COMMUNITY): Payer: Self-pay

## 2023-02-02 ENCOUNTER — Other Ambulatory Visit: Payer: Self-pay

## 2023-02-02 ENCOUNTER — Encounter (HOSPITAL_COMMUNITY)
Admission: RE | Admit: 2023-02-02 | Discharge: 2023-02-02 | Disposition: A | Discharge status: Home / Self Care | Payer: Medicaid Other | Source: Ambulatory Visit | Attending: Orthopaedic Surgery | Admitting: Orthopaedic Surgery

## 2023-02-02 VITALS — BP 123/93 | HR 80 | Temp 97.8°F | Resp 18 | Ht 64.0 in | Wt 214.0 lb

## 2023-02-02 DIAGNOSIS — Z01818 Encounter for other preprocedural examination: Secondary | ICD-10-CM

## 2023-02-02 DIAGNOSIS — Z01812 Encounter for preprocedural laboratory examination: Secondary | ICD-10-CM | POA: Insufficient documentation

## 2023-02-02 LAB — CBC
HCT: 42.5 % (ref 36.0–46.0)
Hemoglobin: 14 g/dL (ref 12.0–15.0)
MCH: 28.4 pg (ref 26.0–34.0)
MCHC: 32.9 g/dL (ref 30.0–36.0)
MCV: 86.2 fL (ref 80.0–100.0)
Platelets: 244 10*3/uL (ref 150–400)
RBC: 4.93 MIL/uL (ref 3.87–5.11)
RDW: 12.7 % (ref 11.5–15.5)
WBC: 4.2 10*3/uL (ref 4.0–10.5)
nRBC: 0 % (ref 0.0–0.2)

## 2023-02-02 NOTE — Progress Notes (Signed)
PCP - Dr. Marrianne Mood  EKG - 01/15/23 ECHO - 04/17/17  ERAS Protcol - yes PRE-SURGERY Ensure   COVID TEST- No   Anesthesia review: No  Patient denies shortness of breath, fever, cough and chest pain at PAT appointment   All instructions explained to the patient, with a verbal understanding of the material. Patient agrees to go over the instructions while at home for a better understanding. The opportunity to ask questions was provided.

## 2023-02-05 ENCOUNTER — Encounter (HOSPITAL_COMMUNITY)
Admission: RE | Disposition: A | Discharge status: Home / Self Care | Payer: Self-pay | Source: Home / Self Care | Attending: Surgery

## 2023-02-05 ENCOUNTER — Other Ambulatory Visit: Payer: Self-pay

## 2023-02-05 ENCOUNTER — Ambulatory Visit (HOSPITAL_COMMUNITY): Payer: Medicaid Other

## 2023-02-05 ENCOUNTER — Other Ambulatory Visit (HOSPITAL_COMMUNITY): Payer: Self-pay

## 2023-02-05 ENCOUNTER — Ambulatory Visit (HOSPITAL_BASED_OUTPATIENT_CLINIC_OR_DEPARTMENT_OTHER): Payer: Medicaid Other

## 2023-02-05 ENCOUNTER — Ambulatory Visit (HOSPITAL_COMMUNITY)
Admission: RE | Admit: 2023-02-05 | Discharge: 2023-02-05 | Disposition: A | Discharge status: Home / Self Care | Payer: Medicaid Other | Source: Home / Self Care | Attending: Surgery | Admitting: Surgery

## 2023-02-05 DIAGNOSIS — Z853 Personal history of malignant neoplasm of breast: Secondary | ICD-10-CM | POA: Diagnosis not present

## 2023-02-05 DIAGNOSIS — K8020 Calculus of gallbladder without cholecystitis without obstruction: Secondary | ICD-10-CM | POA: Insufficient documentation

## 2023-02-05 DIAGNOSIS — Z9013 Acquired absence of bilateral breasts and nipples: Secondary | ICD-10-CM | POA: Insufficient documentation

## 2023-02-05 DIAGNOSIS — Z87891 Personal history of nicotine dependence: Secondary | ICD-10-CM | POA: Diagnosis not present

## 2023-02-05 DIAGNOSIS — Z6836 Body mass index (BMI) 36.0-36.9, adult: Secondary | ICD-10-CM | POA: Diagnosis not present

## 2023-02-05 DIAGNOSIS — Z01818 Encounter for other preprocedural examination: Secondary | ICD-10-CM

## 2023-02-05 HISTORY — PX: CHOLECYSTECTOMY: SHX55

## 2023-02-05 SURGERY — LAPAROSCOPIC CHOLECYSTECTOMY
Anesthesia: General | Site: Abdomen

## 2023-02-05 MED ORDER — ACETAMINOPHEN 500 MG PO TABS
ORAL_TABLET | ORAL | Status: AC
  Administered 2023-02-05: 1000 mg via ORAL
  Filled 2023-02-05: qty 2

## 2023-02-05 MED ORDER — ROCURONIUM BROMIDE 10 MG/ML (PF) SYRINGE
PREFILLED_SYRINGE | INTRAVENOUS | Status: AC
  Filled 2023-02-05: qty 10

## 2023-02-05 MED ORDER — BUPIVACAINE-EPINEPHRINE (PF) 0.25% -1:200000 IJ SOLN
INTRAMUSCULAR | Status: AC
  Filled 2023-02-05: qty 30

## 2023-02-05 MED ORDER — HYDROMORPHONE HCL 1 MG/ML IJ SOLN
INTRAMUSCULAR | Status: AC
  Filled 2023-02-05: qty 1

## 2023-02-05 MED ORDER — CIPROFLOXACIN IN D5W 400 MG/200ML IV SOLN
INTRAVENOUS | Status: AC
  Filled 2023-02-05: qty 200

## 2023-02-05 MED ORDER — ENSURE PRE-SURGERY PO LIQD: 296.0000 mL | Freq: Once | ORAL | Status: DC

## 2023-02-05 MED ORDER — DEXAMETHASONE SODIUM PHOSPHATE 10 MG/ML IJ SOLN
INTRAMUSCULAR | Status: DC | PRN
  Administered 2023-02-05: 10 mg via INTRAVENOUS

## 2023-02-05 MED ORDER — SCOPOLAMINE 1 MG/3DAYS TD PT72: 1.0000 | MEDICATED_PATCH | TRANSDERMAL | Status: DC

## 2023-02-05 MED ORDER — BUPIVACAINE-EPINEPHRINE 0.25% -1:200000 IJ SOLN
INTRAMUSCULAR | Status: DC | PRN
  Administered 2023-02-05: 20 mL

## 2023-02-05 MED ORDER — TRAMADOL HCL 50 MG PO TABS
50.0000 mg | ORAL_TABLET | Freq: Four times a day (QID) | ORAL | 0 refills | Status: DC | PRN
  Filled 2023-02-05: qty 25, 7d supply, fill #0

## 2023-02-05 MED ORDER — ONDANSETRON HCL 4 MG/2ML IJ SOLN
INTRAMUSCULAR | Status: AC
  Filled 2023-02-05: qty 2

## 2023-02-05 MED ORDER — ROCURONIUM BROMIDE 10 MG/ML (PF) SYRINGE
PREFILLED_SYRINGE | INTRAVENOUS | Status: DC | PRN
  Administered 2023-02-05: 50 mg via INTRAVENOUS

## 2023-02-05 MED ORDER — ONDANSETRON HCL 4 MG/2ML IJ SOLN
INTRAMUSCULAR | Status: DC | PRN
  Administered 2023-02-05: 4 mg via INTRAVENOUS

## 2023-02-05 MED ORDER — SODIUM CHLORIDE 0.9 % IR SOLN
Status: DC | PRN
  Administered 2023-02-05: 1000 mL

## 2023-02-05 MED ORDER — LIDOCAINE 2% (20 MG/ML) 5 ML SYRINGE
INTRAMUSCULAR | Status: DC | PRN
  Administered 2023-02-05: 60 mg via INTRAVENOUS

## 2023-02-05 MED ORDER — HYDROMORPHONE HCL 1 MG/ML IJ SOLN
0.2500 mg | INTRAMUSCULAR | Status: DC | PRN
  Administered 2023-02-05 (×3): 0.5 mg via INTRAVENOUS

## 2023-02-05 MED ORDER — DEXAMETHASONE SODIUM PHOSPHATE 10 MG/ML IJ SOLN
INTRAMUSCULAR | Status: AC
  Filled 2023-02-05: qty 1

## 2023-02-05 MED ORDER — LIDOCAINE 2% (20 MG/ML) 5 ML SYRINGE
INTRAMUSCULAR | Status: AC
  Filled 2023-02-05: qty 5

## 2023-02-05 MED ORDER — EPHEDRINE SULFATE-NACL 50-0.9 MG/10ML-% IV SOSY
PREFILLED_SYRINGE | INTRAVENOUS | Status: DC | PRN
  Administered 2023-02-05 (×2): 5 mg via INTRAVENOUS

## 2023-02-05 MED ORDER — CIPROFLOXACIN IN D5W 400 MG/200ML IV SOLN
400.0000 mg | INTRAVENOUS | Status: AC
  Administered 2023-02-05: 400 mg via INTRAVENOUS

## 2023-02-05 MED ORDER — SUGAMMADEX SODIUM 200 MG/2ML IV SOLN
INTRAVENOUS | Status: DC | PRN
  Administered 2023-02-05 (×2): 100 mg via INTRAVENOUS

## 2023-02-05 MED ORDER — FENTANYL CITRATE (PF) 250 MCG/5ML IJ SOLN
INTRAMUSCULAR | Status: AC
  Filled 2023-02-05: qty 5

## 2023-02-05 MED ORDER — ORAL CARE MOUTH RINSE
15.0000 mL | Freq: Once | OROMUCOSAL | Status: AC
  Administered 2023-02-05: 15 mL via OROMUCOSAL

## 2023-02-05 MED ORDER — PROPOFOL 10 MG/ML IV BOLUS
INTRAVENOUS | Status: DC | PRN
  Administered 2023-02-05: 150 mg via INTRAVENOUS

## 2023-02-05 MED ORDER — SCOPOLAMINE 1 MG/3DAYS TD PT72
MEDICATED_PATCH | TRANSDERMAL | Status: AC
  Administered 2023-02-05: 1.5 mg via TRANSDERMAL
  Filled 2023-02-05: qty 1

## 2023-02-05 MED ORDER — MIDAZOLAM HCL 2 MG/2ML IJ SOLN
INTRAMUSCULAR | Status: DC | PRN
  Administered 2023-02-05: 2 mg via INTRAVENOUS

## 2023-02-05 MED ORDER — CHLORHEXIDINE GLUCONATE 0.12 % MT SOLN: 15.0000 mL | Freq: Once | OROMUCOSAL | Status: AC

## 2023-02-05 MED ORDER — FENTANYL CITRATE (PF) 250 MCG/5ML IJ SOLN
INTRAMUSCULAR | Status: DC | PRN
  Administered 2023-02-05: 100 ug via INTRAVENOUS

## 2023-02-05 MED ORDER — PROPOFOL 10 MG/ML IV BOLUS
INTRAVENOUS | Status: AC
  Filled 2023-02-05: qty 20

## 2023-02-05 MED ORDER — LACTATED RINGERS IV SOLN: INTRAVENOUS | Status: DC

## 2023-02-05 MED ORDER — ACETAMINOPHEN 500 MG PO TABS: 1000.0000 mg | ORAL_TABLET | ORAL | Status: AC

## 2023-02-05 MED ORDER — MIDAZOLAM HCL 2 MG/2ML IJ SOLN
INTRAMUSCULAR | Status: AC
  Filled 2023-02-05: qty 2

## 2023-02-05 MED ORDER — 0.9 % SODIUM CHLORIDE (POUR BTL) OPTIME
TOPICAL | Status: DC | PRN
  Administered 2023-02-05: 1000 mL

## 2023-02-05 SURGICAL SUPPLY — 36 items
ADH SKN CLS APL DERMABOND .7 (GAUZE/BANDAGES/DRESSINGS) ×1
APPLIER CLIP 5 13 M/L LIGAMAX5 (MISCELLANEOUS) ×1
APR CLP MED LRG 5 ANG JAW (MISCELLANEOUS) ×1
BAG COUNTER SPONGE SURGICOUNT (BAG) ×1 IMPLANT
BAG SPNG CNTER NS LX DISP (BAG) ×1
CANISTER SUCT 3000ML PPV (MISCELLANEOUS) ×1 IMPLANT
CLIP APPLIE 5 13 M/L LIGAMAX5 (MISCELLANEOUS) ×1 IMPLANT
COVER SURGICAL LIGHT HANDLE (MISCELLANEOUS) ×1 IMPLANT
DERMABOND ADVANCED .7 DNX12 (GAUZE/BANDAGES/DRESSINGS) ×1 IMPLANT
ELECT REM PT RETURN 9FT ADLT (ELECTROSURGICAL) ×1
ELECTRODE REM PT RTRN 9FT ADLT (ELECTROSURGICAL) ×1 IMPLANT
GLOVE SURG SIGNA 7.5 PF LTX (GLOVE) ×1 IMPLANT
GOWN STRL REUS W/ TWL LRG LVL3 (GOWN DISPOSABLE) ×2 IMPLANT
GOWN STRL REUS W/ TWL XL LVL3 (GOWN DISPOSABLE) ×1 IMPLANT
GOWN STRL REUS W/TWL LRG LVL3 (GOWN DISPOSABLE) ×2
GOWN STRL REUS W/TWL XL LVL3 (GOWN DISPOSABLE) ×1
IRRIG SUCT STRYKERFLOW 2 WTIP (MISCELLANEOUS) ×1
IRRIGATION SUCT STRKRFLW 2 WTP (MISCELLANEOUS) ×1 IMPLANT
KIT BASIN OR (CUSTOM PROCEDURE TRAY) ×1 IMPLANT
KIT TURNOVER KIT B (KITS) ×1 IMPLANT
NS IRRIG 1000ML POUR BTL (IV SOLUTION) ×1 IMPLANT
PAD ARMBOARD 7.5X6 YLW CONV (MISCELLANEOUS) ×1 IMPLANT
SCISSORS LAP 5X35 DISP (ENDOMECHANICALS) ×1 IMPLANT
SET TUBE SMOKE EVAC HIGH FLOW (TUBING) ×1 IMPLANT
SLEEVE Z-THREAD 5X100MM (TROCAR) ×2 IMPLANT
SPECIMEN JAR SMALL (MISCELLANEOUS) ×1 IMPLANT
SUT MNCRL AB 4-0 PS2 18 (SUTURE) ×1 IMPLANT
SYS BAG RETRIEVAL 10MM (BASKET) ×1
SYSTEM BAG RETRIEVAL 10MM (BASKET) ×1 IMPLANT
TOWEL GREEN STERILE (TOWEL DISPOSABLE) ×1 IMPLANT
TOWEL GREEN STERILE FF (TOWEL DISPOSABLE) ×1 IMPLANT
TRAY LAPAROSCOPIC MC (CUSTOM PROCEDURE TRAY) ×1 IMPLANT
TROCAR BALLN 12MMX100 BLUNT (TROCAR) ×1 IMPLANT
TROCAR Z-THREAD OPTICAL 5X100M (TROCAR) ×1 IMPLANT
WARMER LAPAROSCOPE (MISCELLANEOUS) ×1 IMPLANT
WATER STERILE IRR 1000ML POUR (IV SOLUTION) ×1 IMPLANT

## 2023-02-05 NOTE — Discharge Instructions (Signed)
[facility] ______[facility] LAPAROSCOPIC SURGERY: POST OP INSTRUCTIONS Always review your discharge instruction sheet given to you by the facility where your surgery was performed. IF YOU HAVE DISABILITY OR FAMILY LEAVE FORMS, YOU MUST BRING THEM TO THE OFFICE FOR PROCESSING.   DO NOT GIVE THEM TO YOUR DOCTOR.  A prescription for pain medication may be given to you upon discharge.  Take your pain medication as prescribed, if needed.  If narcotic pain medicine is not needed, then you may take acetaminophen (Tylenol) or ibuprofen (Advil) as needed. Take your usually prescribed medications unless otherwise directed. If you need a refill on your pain medication, please contact your pharmacy.  They will contact our office to request authorization. Prescriptions will not be filled after 5pm or on week-ends. You should follow a light diet the first few days after arrival home, such as soup and crackers, etc.  Be sure to include lots of fluids daily. Most patients will experience some swelling and bruising in the area of the incisions.  Ice packs will help.  Swelling and bruising can take several days to resolve.  It is common to experience some constipation if taking pain medication after surgery.  Increasing fluid intake and taking a stool softener (such as Colace) will usually help or prevent this problem from occurring.  A mild laxative (Milk of Magnesia or Miralax) should be taken according to package instructions if there are no bowel movements after 48 hours. Unless discharge instructions indicate otherwise, you may remove your bandages 24-48 hours after surgery, and you may shower at that time.  You may have steri-strips (small skin tapes) in place directly over the incision.  These strips should be left on the skin for 7-10 days.  If your surgeon used skin glue on the incision, you may shower in 24 hours.  The glue will flake off over the next 2-3 weeks.  Any sutures or staples will be  removed at the office during your follow-up visit. ACTIVITIES:  You may resume regular (light) daily activities beginning the next day--such as daily self-care, walking, climbing stairs--gradually increasing activities as tolerated.  You may have sexual intercourse when it is comfortable.  Refrain from any heavy lifting or straining until approved by your doctor. You may drive when you are no longer taking prescription pain medication, you can comfortably wear a seatbelt, and you can safely maneuver your car and apply brakes. RETURN TO WORK:  __9/03/2024________________________________________________________ Kathryn Stanley should see your doctor in the office for a follow-up appointment approximately 2-3 weeks after your surgery.  Make sure that you call for this appointment within a day or two after you arrive home to insure a convenient appointment time. OTHER INSTRUCTIONS: YOU MAY SHOWER STARTING TOMORROW ICE PACK, TYLENOL, AND IBUPROFEN ALSO FOR PAIN NO LIFTING MORE THAN 15 TO 20 POUNDS FOR 2 WEEKS __________________________________________________________________________________________________________________________ __________________________________________________________________________________________________________________________ WHEN TO CALL YOUR DOCTOR: Fever over 101.0 Inability to urinate Continued bleeding from incision. Increased pain, redness, or drainage from the incision. Increasing abdominal pain  The clinic staff is available to answer your questions during regular business hours.  Please don't hesitate to call and ask to speak to one of the nurses for clinical concerns.  If you have a medical emergency, go to the nearest emergency room or call 911.  A surgeon from [facility] is always on call at the hospital. [address], [address], [place], [place]  [address] ? [address] [address], [place], [place]   [address] [phone]  [facility] [address] [place], [place]  [address] Phone:  [phone]   February 05, 2023  Patient: Kathryn Stanley  Date of Birth: June 05, 1976  Date of Visit: February 05, 2023    To Whom It May Concern:  Kathryn Stanley was seen and treated on February 05, 2023 .  She may return to work on February 10, 2023          If you have any questions or concerns, please don't hesitate to call.   Sincerely,     [name], MD [facility]

## 2023-02-05 NOTE — Anesthesia Procedure Notes (Signed)
Procedure Name: Intubation Date/Time: 02/05/2023 10:53 AM  Performed by: Georgianne Fick D, CRNAPre-anesthesia Checklist: Patient identified, Emergency Drugs available, Suction available and Patient being monitored Patient Re-evaluated:Patient Re-evaluated prior to induction Oxygen Delivery Method: Circle System Utilized Preoxygenation: Pre-oxygenation with 100% oxygen Induction Type: IV induction and Cricoid Pressure applied Ventilation: Mask ventilation without difficulty Laryngoscope Size: Mac and 4 Grade View: Grade I Tube type: Oral Tube size: 7.0 mm Number of attempts: 1 Airway Equipment and Method: Stylet Placement Confirmation: ETT inserted through vocal cords under direct vision, positive ETCO2 and breath sounds checked- equal and bilateral Secured at: 21 cm Tube secured with: Tape Dental Injury: Teeth and Oropharynx as per pre-operative assessment

## 2023-02-05 NOTE — H&P (Signed)
REFERRING PHYSICIAN: Self PROVIDER: Wayne Both, MD MRN: [id number] DOB: [birth year] DATE OF ENCOUNTER: 01/26/2023 Subjective   Chief Complaint: New Consultation ( Gallbladder, )  History of Present Illness: Kathryn Stanley is a 47 y.o. female who is seen today as an office consultation for evaluation of New Consultation ( Gallbladder, )  This is a 47 year old female who is referred here for evaluation of symptomatic gallstones. She has had several recent attacks of right upper quadrant and epigastric abdominal pain with nausea. One of the attacks lasted long enough with moderate to severe pain to present to the emergency department for evaluation. She has a history of breast cancer and has had bilateral mastectomies with reconstruction. She has had no previous history of gallbladder attacks. She denies jaundice. She currently has no pain today but her last attack was last evening lasting just several minutes. She is now on a low-fat diet.  Review of Systems: A complete review of systems was obtained from the patient. I have reviewed this information and discussed as appropriate with the patient. See HPI as well for other ROS.  ROS   Medical History: Past Medical History:  Diagnosis Date  History of cancer   There is no problem list on file for this patient.  Past Surgical History:  Procedure Laterality Date  MASTECTOMY  reconstruction surgery  2019x    Allergies  Allergen Reactions  Amoxicillin Anaphylaxis, Hives, Other (See Comments) and Rash  PATIENT HAS HAD A PCN REACTION WITH IMMEDIATE RASH, FACIAL/TONGUE/THROAT SWELLING, SOB, OR LIGHTHEADEDNESS WITH HYPOTENSION: # # # YES # # # , Has patient had a PCN reaction causing severe rash involving mucus membranes or skin necrosis: No, Has patient had a PCN reaction that required hospitalization: No, Has patient had a PCN reaction occurring within the last 10 years: No, If all of the above answers are "NO", then may proceed  with Cephalosporin use.  PATIENT HAS HAD A PCN REACTION WITH IMMEDIATE RASH, FACIAL/TONGUE/THROAT SWELLING, SOB, OR LIGHTHEADEDNESS WITH HYPOTENSION: # # # YES # # #  Has patient had a PCN reaction causing severe rash involving mucus membranes or skin necrosis: No Has patient had a PCN reaction that required hospitalization: No Has patient had a PCN reaction occurring within the last 10 years: No If all of the above answers are "NO", then may proceed with Cephalosporin use.  PATIENT HAS HAD A PCN REACTION WITH IMMEDIATE RASH, FACIAL/TONGUE/THROAT SWELLING, SOB, OR LIGHTHEADEDNESS WITH HYPOTENSION: Yes Has patient had a PCN reaction causing severe rash involving mucus membranes or skin necrosis: No Has patient had a PCN reaction that required hospitalization: No Has patient had a PCN reaction occurring within the last 10 years: No If all of the above answers are "NO", then may proceed with Cephalosporin use.  Nicotine Unknown  Patch caused soreness at application site   Current Outpatient Medications on File Prior to Visit  Medication Sig Dispense Refill  venlafaxine (EFFEXOR-XR) 150 MG XR capsule   No current facility-administered medications on file prior to visit.   Family History  Problem Relation Age of Onset  Stroke Father  High blood pressure (Hypertension) Father  Hyperlipidemia (Elevated cholesterol) Father    Social History   Tobacco Use  Smoking Status Former  Types: Cigarettes  Start date: 2018  Smokeless Tobacco Never    Social History   Socioeconomic History  Marital status: Single  Tobacco Use  Smoking status: Former  Types: Cigarettes  Start date: 2018  Smokeless tobacco: Never  Substance  and Sexual Activity  Alcohol use: Yes  Alcohol/week: 1.0 standard drink of alcohol  Types: 1 Standard drinks or equivalent per week  Drug use: Never   Objective:   Vitals:  01/26/23 1147  BP: 131/87  Pulse: 106  Temp: 36.7 C (98 F)  SpO2: 98%  Weight:  97.5 kg (215 lb)  Height: 162.6 cm (5\' 4" )   Body mass index is 36.9 kg/m.  Physical Exam   She appears well on exam today  Her abdomen is soft and nontender to deep palpation in the right upper quadrant. There is no palpable hepatomegaly. There is no jaundice. There are no masses  Labs, Imaging and Diagnostic Testing: I reviewed her ultrasound and laboratory data  Assessment and Plan:   Diagnoses and all orders for this visit:  Symptomatic cholelithiasis   We discussed the findings on ultrasound of a contracted gallbladder with gallstones. I do suspect there is some chronic cholecystitis. She also has a fatty liver. Given the frequency of her attacks, a laparoscopic cholecystectomy is strongly recommended. I gave her and her husband literature regarding this. We explained the diagnosis as well as the surgical procedure in detail. We discussed the risks which includes but is not limited to bleeding, infection, injury to surrounding structures, bile duct injury, bile leak, the need to convert to an open procedure, cardiopulmonary issues, DVT, postoperative recovery, etc. They understand and wish to proceed with surgery which will be scheduled.

## 2023-02-05 NOTE — Anesthesia Postprocedure Evaluation (Signed)
Anesthesia Post Note  Patient: Kathryn Stanley  Procedure(s) Performed: LAPAROSCOPIC CHOLECYSTECTOMY (Abdomen)     Patient location during evaluation: PACU Anesthesia Type: General Level of consciousness: awake and alert Pain management: pain level controlled Vital Signs Assessment: post-procedure vital signs reviewed and stable Respiratory status: spontaneous breathing, nonlabored ventilation and respiratory function stable Cardiovascular status: blood pressure returned to baseline and stable Postop Assessment: no apparent nausea or vomiting Anesthetic complications: no  No notable events documented.  Last Vitals:  Vitals:   02/05/23 1145 02/05/23 1200  BP: 128/64 (!) 140/102  Pulse: 86 91  Resp: 13 (!) 22  Temp:  36.6 C  SpO2: 93% 96%    Last Pain:  Vitals:   02/05/23 1200  TempSrc:   PainSc: 5                  Shirlette Scarber,W. EDMOND

## 2023-02-05 NOTE — Op Note (Signed)
Laparoscopic Cholecystectomy Procedure Note  Indications: This patient presents with symptomatic gallbladder disease and will undergo laparoscopic cholecystectomy.  Pre-operative Diagnosis: symptomatic cholelithiasis  Post-operative Diagnosis: same  Surgeon: [name]   Staff:Circulator: Pietro Cassis, RN Scrub Person: Horatio Pel, RN; Fran Lowes Heywood Footman, RN; Norman Herrlich  Anesthesia: General endotracheal anesthesia  ASA Class: 2  Procedure Details  The patient was seen again in the Holding Room. The risks, benefits, complications, treatment options, and expected outcomes were discussed with the patient. The possibilities of reaction to medication, pulmonary aspiration, perforation of viscus, bleeding, recurrent infection, finding a normal gallbladder, the need for additional procedures, failure to diagnose a condition, the possible need to convert to an open procedure, and creating a complication requiring transfusion or operation were discussed with the patient. The likelihood of improving the patient's symptoms with return to their baseline status is good.  The patient and/or family concurred with the proposed plan, giving informed consent. The site of surgery properly noted. The patient was taken to Operating Room, identified as Kathryn Stanley and the procedure verified as Laparoscopic Cholecystectomy with Intraoperative Cholangiogram. A Time Out was held and the above information confirmed.  Prior to the induction of general anesthesia, antibiotic prophylaxis was administered. General endotracheal anesthesia was then administered and tolerated well. After the induction, the abdomen was prepped with Chloraprep and draped in sterile fashion. The patient was positioned in the supine position.  Local anesthetic agent was injected into the skin near the umbilicus and an incision made. We dissected down to the abdominal fascia with blunt dissection.  The fascia was incised  vertically and we entered the peritoneal cavity bluntly.  A pursestring suture of 0-Vicryl was placed around the fascial opening.  The Hasson cannula was inserted and secured with the stay suture.  Pneumoperitoneum was then created with CO2 and tolerated well without any adverse changes in the patient's vital signs. A 5-mm port was placed in the subxiphoid position.  Two 5-mm ports were placed in the right upper quadrant. All skin incisions were infiltrated with a local anesthetic agent before making the incision and placing the trocars.   We positioned the patient in reverse Trendelenburg, tilted slightly to the patient's left.  The gallbladder was identified, the fundus grasped and retracted cephalad. Adhesions were lysed bluntly and with the electrocautery where indicated, taking care not to injure any adjacent organs or viscus. The infundibulum was grasped and retracted laterally, exposing the peritoneum overlying the triangle of Calot. This was then divided and exposed in a blunt fashion. The cystic duct was clearly identified and bluntly dissected circumferentially. A critical view of the cystic duct and cystic artery was obtained.  The cystic duct was then ligated with clips and divided. The cystic artery was, dissected free, ligated with clips and divided as well.   The gallbladder was dissected from the liver bed in retrograde fashion with the electrocautery. The gallbladder was removed and placed in an Endocatch sac. The liver bed was irrigated and inspected. Hemostasis was achieved with the electrocautery. Copious irrigation was utilized and was repeatedly aspirated until clear.  The gallbladder and Endocatch sac were then removed through the umbilical port site.  The pursestring suture was used to close the umbilical fascia.    We again inspected the right upper quadrant for hemostasis.  Pneumoperitoneum was released as we removed the trocars.  4-0 Monocryl was used to close the skin.   Skin glue  was then applied. The patient was then  extubated and brought to the recovery room in stable condition. Instrument, sponge, and needle counts were correct at closure and at the conclusion of the case.   Findings: Chronic cholecystitis with Cholelithiasis  Estimated Blood Loss: Minimal         Drains: none         Specimens: Gallbladder           Complications: None; patient tolerated the procedure well.         Disposition: PACU - hemodynamically stable.         Condition: stable

## 2023-02-05 NOTE — Interval H&P Note (Signed)
History and Physical Interval Note: no change in H and P  02/05/2023 9:56 AM  Kathryn Stanley  has presented today for surgery, with the diagnosis of SYMPTOMATIC GALLSTONES.  The various methods of treatment have been discussed with the patient and family. After consideration of risks, benefits and other options for treatment, the patient has consented to  Procedure(s): LAPAROSCOPIC CHOLECYSTECTOMY (N/A) as a surgical intervention.  The patient's history has been reviewed, patient examined, no change in status, stable for surgery.  I have reviewed the patient's chart and labs.  Questions were answered to the patient's satisfaction.     [name]

## 2023-02-05 NOTE — Anesthesia Preprocedure Evaluation (Addendum)
Anesthesia Evaluation  Patient identified by MRN, date of birth, ID band Patient awake    Reviewed: Allergy & Precautions, H&P , NPO status , Patient's Chart, lab work & pertinent test results  History of Anesthesia Complications (+) PONV and history of anesthetic complications  Airway Mallampati: II  TM Distance: >3 FB Neck ROM: Full    Dental no notable dental hx. (+) Teeth Intact, Dental Advisory Given   Pulmonary former smoker   Pulmonary exam normal breath sounds clear to auscultation       Cardiovascular negative cardio ROS  Rhythm:Regular Rate:Normal     Neuro/Psych negative neurological ROS  negative psych ROS   GI/Hepatic Neg liver ROS,GERD  ,,  Endo/Other    Morbid obesity  Renal/GU negative Renal ROS  negative genitourinary   Musculoskeletal   Abdominal   Peds  Hematology negative hematology ROS (+)   Anesthesia Other Findings   Reproductive/Obstetrics negative OB ROS                             Anesthesia Physical Anesthesia Plan  ASA: 2  Anesthesia Plan: General   Post-op Pain Management: Tylenol PO (pre-op)*   Induction: Intravenous  PONV Risk Score and Plan: 4 or greater and Ondansetron, Dexamethasone, Midazolam and Scopolamine patch - Pre-op  Airway Management Planned: Oral ETT  Additional Equipment:   Intra-op Plan:   Post-operative Plan: Extubation in OR  Informed Consent: I have reviewed the patients History and Physical, chart, labs and discussed the procedure including the risks, benefits and alternatives for the proposed anesthesia with the patient or authorized representative who has indicated his/her understanding and acceptance.     Dental advisory given and Interpreter used for interveiw  Plan Discussed with: CRNA  Anesthesia Plan Comments:        Anesthesia Quick Evaluation

## 2023-02-05 NOTE — Transfer of Care (Signed)
Immediate Anesthesia Transfer of Care Note  Patient: Kathryn Stanley  Procedure(s) Performed: LAPAROSCOPIC CHOLECYSTECTOMY (Abdomen)  Patient Location: PACU  Anesthesia Type:General  Level of Consciousness: awake, alert , and oriented  Airway & Oxygen Therapy: Patient Spontanous Breathing  Post-op Assessment: Report given to RN  Post vital signs: Reviewed and stable  Last Vitals:  Vitals Value Taken Time  BP 145/97 02/05/23 1136  Temp    Pulse 96 02/05/23 1138  Resp 18 02/05/23 1138  SpO2 91 % 02/05/23 1138  Vitals shown include unfiled device data.  Last Pain:  Vitals:   02/05/23 1003  TempSrc:   PainSc: 0-No pain      Patients Stated Pain Goal: 3 (02/05/23 1003)  Complications: No notable events documented.

## 2023-02-06 ENCOUNTER — Encounter (HOSPITAL_COMMUNITY): Payer: Self-pay | Admitting: Surgery

## 2023-02-12 LAB — SURGICAL PATHOLOGY

## 2023-02-24 ENCOUNTER — Other Ambulatory Visit (HOSPITAL_COMMUNITY): Payer: Self-pay

## 2023-03-26 ENCOUNTER — Other Ambulatory Visit (HOSPITAL_COMMUNITY): Payer: Self-pay

## 2023-04-16 ENCOUNTER — Ambulatory Visit (HOSPITAL_COMMUNITY)
Admission: EM | Admit: 2023-04-16 | Discharge: 2023-04-16 | Disposition: A | Discharge status: Home / Self Care | Payer: Medicaid Other | Attending: Family Medicine | Admitting: Family Medicine

## 2023-04-16 ENCOUNTER — Encounter (HOSPITAL_COMMUNITY): Payer: Self-pay | Admitting: *Deleted

## 2023-04-16 DIAGNOSIS — Z23 Encounter for immunization: Secondary | ICD-10-CM | POA: Diagnosis not present

## 2023-04-16 DIAGNOSIS — S41112A Laceration without foreign body of left upper arm, initial encounter: Secondary | ICD-10-CM

## 2023-04-16 MED ORDER — LIDOCAINE HCL (PF) 1 % IJ SOLN
INTRAMUSCULAR | Status: AC
  Filled 2023-04-16: qty 2

## 2023-04-16 MED ORDER — TETANUS-DIPHTH-ACELL PERTUSSIS 5-2.5-18.5 LF-MCG/0.5 IM SUSY
PREFILLED_SYRINGE | INTRAMUSCULAR | Status: AC
  Filled 2023-04-16: qty 0.5

## 2023-04-16 MED ORDER — TETANUS-DIPHTH-ACELL PERTUSSIS 5-2.5-18.5 LF-MCG/0.5 IM SUSY
0.5000 mL | PREFILLED_SYRINGE | Freq: Once | INTRAMUSCULAR | Status: AC
  Administered 2023-04-16: 0.5 mL via INTRAMUSCULAR

## 2023-04-16 NOTE — Discharge Instructions (Signed)
I have placed 2 stitches in your cut. They need to be removed in 10 days.  You can return here for removing the stitches.  You can make an online appointment to help you get in and out a little faster.  We are open 8- 8 Monday through Friday and 10-7 on Saturday and Sunday.   You have been given a Tdap vaccination to boost your tetanus immunity

## 2023-04-16 NOTE — ED Provider Notes (Signed)
[facility]    CSN: [id number] Arrival date & time: 04/16/23  1614      History   Chief Complaint Chief Complaint  Patient presents with   Laceration    HPI AEON KOORS is a 47 y.o. female.    Laceration Here for a cut on her left forearm, happened when she was using a boxcutter about 2 hours prior to arrival here.  She is allergic to amoxil, nicotine patches, and chlorhexidine. General anesthesia give her problems.  Last tetanus a long time ago  Has had a hyst 2018  Past Medical History:  Diagnosis Date   Breast cancer, left breast (HCC) dx'd 01/2017   Complication of anesthesia    Dermatologic problem    possible psoriasis. Pt has not had been diagnosed by dermatology.   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   S/P hysterectomy with oophorectomy 01/04/2020   Mixed hyperlipidemia 01/04/2020   Menopausal symptoms 02/01/2019   Obesity (BMI 35.0-39.9 without comorbidity) 02/01/2019   History of breast cancer 06/25/2018   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   Genetic testing 03/05/2017   Port catheter in place 01/30/2017   Malignant neoplasm of upper-outer quadrant of left breast in female, 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   Tobacco use 12/12/2016    Past Surgical History:  Procedure Laterality Date   APPLICATION OF A-CELL OF CHEST/ABDOMEN Right 06/25/2018   Procedure: ACELLULAR DERMIS TO RIGHT CHEST;   Surgeon: Glenna Fellows, MD;  Location: [facility];  Service: Plastics;  Laterality: Right;   BREAST BIOPSY Left 12/2016   BREAST IMPLANT EXCHANGE Right 06/24/2019   Procedure: REVISION RIGHT BREAST RECONSTRUCTION WITH SILICONE IMPLANT EXCAHNGE, STRATTICE TO RIGHT CHEST;  Surgeon: Glenna Fellows, MD;  Location: [facility];  Service: Plastics;  Laterality: Right;   BREAST RECONSTRUCTION Bilateral 07/16/2017   BILATERAL BREAST RECONSTRUCTION WITH PLACEMENT OF TISSUE EXPANDER AND ALLODERM/notes 07/16/2017   BREAST RECONSTRUCTION  06/25/2018   CESAREAN SECTION  2002   CHOLECYSTECTOMY N/A 02/05/2023   Procedure: LAPAROSCOPIC CHOLECYSTECTOMY;  Surgeon: [name], MD;  [facility]: [facility];  Service: General;  Laterality: N/A;   LATISSIMUS FLAP TO BREAST Left 06/25/2018   Procedure: LEFT LATISSIMUS FLAP TO LEFT CHEST;  Surgeon: Glenna Fellows, MD;  [facility]: [facility];  Service: Plastics;  Laterality: Left;   LIPOSUCTION Right 06/24/2019   Procedure: LIPOSUCTION RIGHT CHEST;  Surgeon: Glenna Fellows, MD;  Location: [facility];  Service: Plastics;  Laterality: Right;   MASTECTOMY Bilateral 07/16/2017   LEFT SKIN SPARING MASTECTOMY WITH LEFT RADIOACTIVE SEED TARGETED AXILLARY LYMPH NODE EXCISION AND AXILLARY SENTINEL LYMPH NODE BIOPSY; RIGHT PROPHYLACTIC SKIN SPARING MASTECTOMY Hattie Perch 07/16/2017   MASTECTOMY WITH RADIOACTIVE SEED GUIDED EXCISION AND AXILLARY SENTINEL LYMPH NODE BIOPSY Left 07/16/2017   Procedure: LEFT SKIN SPARING MASTECTOMY WITH LEFT RADIOACTIVE SEED TARGETED AXILLARY LYMPH NODE EXCISION AND AXILLARY SENTINEL LYMPH NODE BIOPSY;  Surgeon: Emelia Loron, MD;  [facility];  Service: General;  Laterality: Left;   NIPPLE SPARING MASTECTOMY Right 07/16/2017   Procedure: RIGHT PROPHYLACTIC SKIN SPARING MASTECTOMY;  Surgeon: Emelia Loron, MD;  Location: [facility];  Service: General;  Laterality: Right;   PORT-A-CATH REMOVAL N/A 07/16/2017   Procedure: REMOVAL  PORT-A-CATH;  Surgeon: Emelia Loron, MD;  Location: [facility];  Service: General;  Laterality: N/A;   PORTA CATH REMOVAL  07/16/2017   PORTACATH PLACEMENT N/A 01/14/2017   Procedure: INSERTION PORT-A-CATH WITH [place];  Surgeon: Emelia Loron, MD;  Location: [facility] OR;  Service: General;  Laterality: N/A;   REMOVAL OF BILATERAL TISSUE EXPANDERS WITH PLACEMENT OF BILATERAL BREAST IMPLANTS Bilateral 06/25/2018   Procedure: REMOVAL OF BILATERAL TISSUE EXPANDERS WITH PLACEMENT OF BILATERAL SILICONE BREAST IMPLANTS;  Surgeon: Glenna Fellows, MD;  [facility];  Service: Plastics;  Laterality: Bilateral;   ROBOTIC ASSISTED TOTAL HYSTERECTOMY WITH BILATERAL SALPINGO OOPHERECTOMY Bilateral 11/03/2017   Procedure: XI ROBOTIC ASSISTED TOTAL HYSTERECTOMY WITH BILATERAL SALPINGO OOPHORECTOMY;  Surgeon: Adolphus Birchwood, MD;  Location: [facility];  Service: Gynecology;  Laterality: Bilateral;    OB History     Gravida  1   Para      Term      Preterm      AB      Living  2      SAB      IAB      Ectopic      Multiple  1   Live Births  2            Home Medications    Prior to Admission medications   Medication Sig Start Date End Date Taking? Authorizing Provider  venlafaxine XR (EFFEXOR-XR) 150 MG 24 hr capsule Take 1 capsule (150 mg total) by mouth daily with breakfast. 11/20/22  Yes Malachy Mood, MD    Family History Family History  Problem Relation Age of Onset   Stroke Maternal Grandmother    Stroke Father    Hypertension Father    Other Father        Adopted    Social History Social History   Tobacco Use   Smoking status: Former    Current packs/day: 0.00    Average packs/day: 1 pack/day for 17.0 years (17.0 ttl pk-yrs)    Types: Cigarettes    Start date: 12/16/1999    Quit date: 12/15/2016    Years since quitting: 6.3    Passive exposure: Never   Smokeless tobacco: Never  Vaping Use   Vaping status: Never Used  Substance Use Topics   Alcohol use: No   Drug use: No      Allergies   Amoxicillin, Other, Chlorhexidine gluconate, and Nicotine   Review of Systems Review of Systems   Physical Exam Triage Vital Signs ED Triage Vitals  Encounter Vitals Group     BP 04/16/23 1633 116/82     Systolic BP Percentile --      Diastolic BP Percentile --      Pulse Rate 04/16/23 1633 91     Resp 04/16/23 1633 17     Temp 04/16/23 1633 98 F (36.7 C)     Temp Source 04/16/23 1645 Oral     SpO2 04/16/23 1633 96 %     Weight --      Height --      Head Circumference --      Peak Flow --      Pain Score 04/16/23 1644 0     Pain Loc --  Pain Education --      Exclude from Growth Chart --    No data found.  Updated Vital Signs BP (!) 129/91 (BP Location: Right Arm)   Pulse 90   Temp 97.8 F (36.6 C) (Oral)   Resp 18   LMP 01/07/2017 (Approximate)   SpO2 98%   Visual Acuity Right Eye Distance:   Left Eye Distance:   Bilateral Distance:    Right Eye Near:   Left Eye Near:    Bilateral Near:     Physical Exam Vitals reviewed.  Constitutional:      General: She is not in acute distress.    Appearance: She is not ill-appearing, toxic-appearing or diaphoretic.  Skin:    Coloration: Skin is not pale.     Comments: There is a 1 cm long linear laceration about 3-4 mm deep on the volar surface of the forearm. Not bleeding at the time of exam. Neurovascular intact  Neurological:     General: No focal deficit present.     Mental Status: She is alert and oriented to person, place, and time.  Psychiatric:        Behavior: Behavior normal.      UC Treatments / Results  Labs (all labs ordered are listed, but only abnormal results are displayed) Labs Reviewed - No data to display  EKG   Radiology No results found.  Procedures Procedures (including critical care time)  Medications Ordered in UC Medications  Tdap (BOOSTRIX) injection 0.5 mL (0.5 mLs Intramuscular Given 04/16/23 1730)    Initial Impression / Assessment and Plan  / UC Course  I have reviewed the triage vital signs and the nursing notes.  Pertinent labs & imaging results that were available during my care of the patient were reviewed by me and considered in my medical decision making (see chart for details).     Risk and benefits of laceration repair are discussed.  She gives verbal consent.  1% lidocaine plain is used for infiltration anesthesia with good anesthesia obtained.   under clean conditions to sutures are placed with 4-0 nylon.  Hemostasis is achieved and approximation of the skin edges of the cheek.  EBL 2 mL (the began bleeding during injection of anesthesia.)  Wound care is explained.  And she will return in 10 days for suture removal.  She is given a tetanus booster here today Final Clinical Impressions(s) / UC Diagnoses   Final diagnoses:  Laceration of left upper extremity, initial encounter     Discharge Instructions      I have placed 2 stitches in your cut. They need to be removed in 10 days.  You can return here for removing the stitches.  You can make an online appointment to help you get in and out a little faster.  We are open 8- 8 Monday through Friday and 10-7 on Saturday and Sunday.   You have been given a Tdap vaccination to boost your tetanus immunity     ED Prescriptions   None    PDMP not reviewed this encounter.   Zenia Resides, MD 04/16/23 [phone]

## 2023-04-16 NOTE — ED Triage Notes (Signed)
Pt states about 2 hours ago she accidentally cut her left wrist with a box cutter at work.    She is unsure of last TDAP.

## 2023-04-30 ENCOUNTER — Encounter (HOSPITAL_COMMUNITY): Payer: Self-pay

## 2023-04-30 ENCOUNTER — Ambulatory Visit (HOSPITAL_COMMUNITY)
Admission: RE | Admit: 2023-04-30 | Discharge: 2023-04-30 | Disposition: A | Discharge status: Home / Self Care | Payer: Medicaid Other | Source: Ambulatory Visit | Attending: Family Medicine | Admitting: Family Medicine

## 2023-04-30 DIAGNOSIS — Z4802 Encounter for removal of sutures: Secondary | ICD-10-CM

## 2023-04-30 NOTE — ED Provider Notes (Signed)
[facility]    CSN: [id number] Arrival date & time: 04/30/23  6295      History   Chief Complaint No chief complaint on file.   HPI Kathryn Stanley is a 47 y.o. female.   She presents for suture removal. Her wrist wound is healing well and there have been no signs of infection. Her pain is minimal. She had kept it covered and clean.      Past Medical History:  Diagnosis Date   Breast cancer, left breast (HCC) dx'd 01/2017   Complication of anesthesia    Dermatologic problem    possible psoriasis. Pt has not had been diagnosed by dermatology.   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   S/P hysterectomy with oophorectomy 01/04/2020   Mixed hyperlipidemia 01/04/2020   Menopausal symptoms 02/01/2019   Obesity (BMI 35.0-39.9 without comorbidity) 02/01/2019   History of breast cancer 06/25/2018   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   Genetic testing 03/05/2017   Port catheter in place 01/30/2017   Malignant neoplasm of upper-outer quadrant of left breast in female, 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   Tobacco use 12/12/2016    Past Surgical History:  Procedure Laterality Date   APPLICATION OF A-CELL OF CHEST/ABDOMEN Right 06/25/2018   Procedure: ACELLULAR DERMIS TO RIGHT CHEST;  Surgeon: Glenna Fellows, MD;  Location: [facility];  Service: Plastics;  Laterality: Right;   BREAST BIOPSY Left 12/2016   BREAST IMPLANT EXCHANGE  Right 06/24/2019   Procedure: REVISION RIGHT BREAST RECONSTRUCTION WITH SILICONE IMPLANT EXCAHNGE, STRATTICE TO RIGHT CHEST;  Surgeon: Glenna Fellows, MD;  Location: [facility];  Service: Plastics;  Laterality: Right;   BREAST RECONSTRUCTION Bilateral 07/16/2017   BILATERAL BREAST RECONSTRUCTION WITH PLACEMENT OF TISSUE EXPANDER AND ALLODERM/notes 07/16/2017   BREAST RECONSTRUCTION  06/25/2018   CESAREAN SECTION  2002   CHOLECYSTECTOMY N/A 02/05/2023   Procedure: LAPAROSCOPIC CHOLECYSTECTOMY;  Surgeon: [name], MD;  [facility];  Service: General;  Laterality: N/A;   LATISSIMUS FLAP TO BREAST Left 06/25/2018   Procedure: LEFT LATISSIMUS FLAP TO LEFT CHEST;  Surgeon: Glenna Fellows, MD;  [facility];  Service: Plastics;  Laterality: Left;   LIPOSUCTION Right 06/24/2019   Procedure: LIPOSUCTION RIGHT CHEST;  Surgeon: Glenna Fellows, MD;  Location: [facility];  Service: Plastics;  Laterality: Right;   MASTECTOMY Bilateral 07/16/2017   LEFT SKIN SPARING MASTECTOMY WITH LEFT RADIOACTIVE SEED TARGETED AXILLARY LYMPH NODE EXCISION AND AXILLARY SENTINEL LYMPH NODE BIOPSY; RIGHT PROPHYLACTIC SKIN SPARING MASTECTOMY Hattie Perch 07/16/2017   MASTECTOMY WITH RADIOACTIVE SEED GUIDED EXCISION AND AXILLARY SENTINEL LYMPH NODE BIOPSY Left 07/16/2017   Procedure: LEFT SKIN SPARING MASTECTOMY WITH LEFT RADIOACTIVE SEED TARGETED AXILLARY LYMPH NODE EXCISION AND AXILLARY SENTINEL LYMPH NODE BIOPSY;  Surgeon: Emelia Loron, MD;  [facility];  Service: General;  Laterality: Left;   NIPPLE SPARING MASTECTOMY Right 07/16/2017   Procedure: RIGHT PROPHYLACTIC SKIN SPARING MASTECTOMY;  Surgeon: Dwain Sarna,  Molli Hazard, MD;  Location: [facility] OR;  Service: General;  Laterality: Right;   PORT-A-CATH REMOVAL N/A 07/16/2017   Procedure: REMOVAL PORT-A-CATH;  Surgeon: Emelia Loron, MD;  Location: [facility] OR;  Service: General;  Laterality: N/A;   PORTA CATH REMOVAL  07/16/2017   PORTACATH  PLACEMENT N/A 01/14/2017   Procedure: INSERTION PORT-A-CATH WITH [place];  Surgeon: Emelia Loron, MD;  Location: [facility] OR;  Service: General;  Laterality: N/A;   REMOVAL OF BILATERAL TISSUE EXPANDERS WITH PLACEMENT OF BILATERAL BREAST IMPLANTS Bilateral 06/25/2018   Procedure: REMOVAL OF BILATERAL TISSUE EXPANDERS WITH PLACEMENT OF BILATERAL SILICONE BREAST IMPLANTS;  Surgeon: Glenna Fellows, MD;  [facility];  Service: Plastics;  Laterality: Bilateral;   ROBOTIC ASSISTED TOTAL HYSTERECTOMY WITH BILATERAL SALPINGO OOPHERECTOMY Bilateral 11/03/2017   Procedure: XI ROBOTIC ASSISTED TOTAL HYSTERECTOMY WITH BILATERAL SALPINGO OOPHORECTOMY;  Surgeon: Adolphus Birchwood, MD;  Location: [facility];  Service: Gynecology;  Laterality: Bilateral;    OB History     Gravida  1   Para      Term      Preterm      AB      Living  2      SAB      IAB      Ectopic      Multiple  1   Live Births  2            Home Medications    Prior to Admission medications   Medication Sig Start Date End Date Taking? Authorizing Provider  venlafaxine XR (EFFEXOR-XR) 150 MG 24 hr capsule Take 1 capsule (150 mg total) by mouth daily with breakfast. 11/20/22   Malachy Mood, MD    Family History Family History  Problem Relation Age of Onset   Stroke Maternal Grandmother    Stroke Father    Hypertension Father    Other Father        Adopted    Social History Social History   Tobacco Use   Smoking status: Former    Current packs/day: 0.00    Average packs/day: 1 pack/day for 17.0 years (17.0 ttl pk-yrs)    Types: Cigarettes    Start date: 12/16/1999    Quit date: 12/15/2016    Years since quitting: 6.3    Passive exposure: Never   Smokeless tobacco: Never  Vaping Use   Vaping status: Never Used  Substance Use Topics   Alcohol use: No   Drug use: No     Allergies   Amoxicillin, Other, Chlorhexidine gluconate, and Nicotine   Review of Systems Review of Systems  Constitutional:  Negative for  chills and fever.  Musculoskeletal:  Negative for arthralgias and joint swelling.  Skin:  Negative for color change, pallor and rash.       No increased edema, warmth, or drainage   Neurological:  Negative for numbness.     Physical Exam Triage Vital Signs ED Triage Vitals [04/30/23 0844]  Encounter Vitals Group     BP      Systolic BP Percentile      Diastolic BP Percentile      Pulse      Resp      Temp      Temp src      SpO2      Weight      Height      Head Circumference      Peak Flow      Pain Score 0     Pain Loc  Pain Education      Exclude from Growth Chart    No data found.  Updated Vital Signs LMP 01/07/2017 (Approximate)   Visual Acuity Right Eye Distance:   Left Eye Distance:   Bilateral Distance:    Right Eye Near:   Left Eye Near:    Bilateral Near:     Physical Exam Vitals reviewed.  Constitutional:      Appearance: Normal appearance. She is normal weight.  Musculoskeletal:     Comments: Full ROM of the wrist and fingers.  Strength 5/5   Skin:    General: Skin is warm and dry.     Capillary Refill: Capillary refill takes 2 to 3 seconds.     Coloration: Skin is not pale.     Findings: Erythema (trace around the healing wound edges) present.     Comments: There is a 1cm well healing laceration over the anterior aspect of the left wrist with two well visualized sutures in place. The wound edges are approximated well and no drainage. No surrounding signs of infection.   Neurological:     Mental Status: She is alert.     Sensory: No sensory deficit.      UC Treatments / Results  Labs (all labs ordered are listed, but only abnormal results are displayed) Labs Reviewed - No data to display  EKG   Radiology No results found.  Procedures Procedures (including critical care time)  Medications Ordered in UC Medications - No data to display  Initial Impression / Assessment and Plan / UC Course  I have reviewed the triage vital  signs and the nursing notes.  Pertinent labs & imaging results that were available during my care of the patient were reviewed by me and considered in my medical decision making (see chart for details).     Suture removal - The pt's wound is healing well.  - Sutures removed without difficulty or complication after pt's consent verbally.  - The wound was covered with a small amount of bacitracin and a bandage - We discussed signs/sxs of infection or non healing wound to return for - The pt voiced understanind  Final Clinical Impressions(s) / UC Diagnoses   Final diagnoses:  Visit for suture removal     Discharge Instructions      Your wound is healing well. Keep it covered with a bandage and petroleum jelly and change daily until the wound is completely closed. If you develop increased swelling, warmth, pain, fever or chills return for further treatment.     ED Prescriptions   None    PDMP not reviewed this encounter.   Ivor Messier, MD 04/30/23 [phone]

## 2023-04-30 NOTE — Discharge Instructions (Signed)
Your wound is healing well. Keep it covered with a bandage and petroleum jelly and change daily until the wound is completely closed. If you develop increased swelling, warmth, pain, fever or chills return for further treatment.

## 2023-04-30 NOTE — ED Triage Notes (Signed)
Pt presents to have two sutures removed from left arm. Does have some redness at sit.

## 2023-05-05 ENCOUNTER — Other Ambulatory Visit (HOSPITAL_COMMUNITY): Payer: Self-pay

## 2023-05-05 ENCOUNTER — Other Ambulatory Visit: Payer: Self-pay | Admitting: Hematology

## 2023-05-05 ENCOUNTER — Other Ambulatory Visit: Payer: Self-pay

## 2023-05-05 MED ORDER — VENLAFAXINE HCL ER 150 MG PO CP24
150.0000 mg | ORAL_CAPSULE | Freq: Every day | ORAL | 5 refills | Status: DC
  Filled 2023-05-25: qty 30, 30d supply, fill #0
  Filled 2023-06-19: qty 30, 30d supply, fill #1
  Filled 2023-07-26: qty 30, 30d supply, fill #2
  Filled 2023-08-25: qty 30, 30d supply, fill #3
  Filled 2023-09-26: qty 30, 30d supply, fill #4
  Filled 2023-10-21: qty 30, 30d supply, fill #5

## 2023-05-25 ENCOUNTER — Other Ambulatory Visit (HOSPITAL_COMMUNITY): Payer: Self-pay

## 2023-06-22 ENCOUNTER — Other Ambulatory Visit (HOSPITAL_COMMUNITY): Payer: Self-pay

## 2023-08-09 ENCOUNTER — Encounter (HOSPITAL_BASED_OUTPATIENT_CLINIC_OR_DEPARTMENT_OTHER): Payer: Self-pay

## 2023-08-09 ENCOUNTER — Other Ambulatory Visit: Payer: Self-pay

## 2023-08-09 ENCOUNTER — Emergency Department (HOSPITAL_BASED_OUTPATIENT_CLINIC_OR_DEPARTMENT_OTHER)
Admission: EM | Admit: 2023-08-09 | Discharge: 2023-08-09 | Disposition: A | Discharge status: Home / Self Care | Attending: Emergency Medicine | Admitting: Emergency Medicine

## 2023-08-09 DIAGNOSIS — Z87891 Personal history of nicotine dependence: Secondary | ICD-10-CM | POA: Insufficient documentation

## 2023-08-09 DIAGNOSIS — Z853 Personal history of malignant neoplasm of breast: Secondary | ICD-10-CM | POA: Insufficient documentation

## 2023-08-09 DIAGNOSIS — J210 Acute bronchiolitis due to respiratory syncytial virus: Secondary | ICD-10-CM | POA: Insufficient documentation

## 2023-08-09 DIAGNOSIS — R059 Cough, unspecified: Secondary | ICD-10-CM | POA: Diagnosis present

## 2023-08-09 LAB — RESP PANEL BY RT-PCR (RSV, FLU A&B, COVID)  RVPGX2
Influenza A by PCR: NEGATIVE
Influenza B by PCR: NEGATIVE
Resp Syncytial Virus by PCR: POSITIVE — AB
SARS Coronavirus 2 by RT PCR: NEGATIVE

## 2023-08-09 MED ORDER — DEXAMETHASONE 4 MG PO TABS
10.0000 mg | ORAL_TABLET | Freq: Once | ORAL | Status: AC
  Administered 2023-08-09: 10 mg via ORAL
  Filled 2023-08-09: qty 3

## 2023-08-09 MED ORDER — BENZONATATE 100 MG PO CAPS: 100.0000 mg | ORAL_CAPSULE | Freq: Three times a day (TID) | ORAL | 0 refills | Status: DC

## 2023-08-09 NOTE — ED Notes (Signed)
 Pt given discharge instructions and reviewed prescriptions. Opportunities given for questions. Pt verbalizes understanding. Jillyn Hidden, RN

## 2023-08-09 NOTE — ED Provider Notes (Signed)
 [facility] AT [facility] Provider Note   CSN: [id number] Arrival date & time: 08/09/23  4098     History  Chief Complaint  Patient presents with   Cough    Kathryn Stanley is a 48 y.o. female.  Patient here for cough chills congestion for 3 days.  Maybe low-grade fevers at home.  Former smoker.  History of breast cancer reflux ago.  Denies any weakness numbness tingling.  No nausea vomiting diarrhea.  The history is provided by the patient.       Home Medications Prior to Admission medications   Medication Sig Start Date End Date Taking? Authorizing Provider  benzonatate (TESSALON) 100 MG capsule Take 1 capsule (100 mg total) by mouth every 8 (eight) hours. 08/09/23  Yes Valena Ivanov, DO  venlafaxine XR (EFFEXOR-XR) 150 MG 24 hr capsule Take 1 capsule (150 mg total) by mouth daily with breakfast. 05/05/23   Malachy Mood, MD      Allergies    Amoxicillin, Other, Chlorhexidine gluconate, and Nicotine    Review of Systems   Review of Systems  Physical Exam Updated Vital Signs BP 137/88 (BP Location: Right Arm)   Pulse 92   Temp 97.9 F (36.6 C) (Oral)   Resp 16   LMP 01/07/2017 (Approximate)   SpO2 96%  Physical Exam Vitals and nursing note reviewed.  Constitutional:      General: She is not in acute distress.    Appearance: She is well-developed. She is not ill-appearing.  HENT:     Head: Normocephalic and atraumatic.     Nose: Nose normal.     Mouth/Throat:     Mouth: Mucous membranes are moist.  Eyes:     Extraocular Movements: Extraocular movements intact.     Conjunctiva/sclera: Conjunctivae normal.     Pupils: Pupils are equal, round, and reactive to light.  Cardiovascular:     Rate and Rhythm: Normal rate and regular rhythm.     Pulses: Normal pulses.     Heart sounds: Normal heart sounds. No murmur heard. Pulmonary:     Effort: Pulmonary effort is normal. No respiratory distress.     Breath sounds: Normal breath sounds.   Abdominal:     Palpations: Abdomen is soft.     Tenderness: There is no abdominal tenderness.  Musculoskeletal:        General: No swelling.     Cervical back: Normal range of motion and neck supple.  Skin:    General: Skin is warm and dry.     Capillary Refill: Capillary refill takes less than 2 seconds.  Neurological:     General: No focal deficit present.     Mental Status: She is alert and oriented to person, place, and time.     Cranial Nerves: No cranial nerve deficit.     Sensory: No sensory deficit.     Motor: No weakness.     Coordination: Coordination normal.  Psychiatric:        Mood and Affect: Mood normal.     ED Results / Procedures / Treatments   Labs (all labs ordered are listed, but only abnormal results are displayed) Labs Reviewed  RESP PANEL BY RT-PCR (RSV, FLU A&B, COVID)  [id number] - Abnormal; Notable for the following components:      Result Value   Resp Syncytial Virus by PCR POSITIVE (*)    All other components within normal limits    EKG None  Radiology No results found.  Procedures Procedures    Medications Ordered in ED Medications  dexamethasone (DECADRON) tablet 10 mg (has no administration in time range)    ED Course/ Medical Decision Making/ A&P                                 Medical Decision Making Risk Prescription drug management.   [name] is here with flulike symptoms.  Normal vitals.  No fever.  Symptoms for the last 2 to 3 days.  Clear breath sounds.  Differential diagnosis likely viral process.  Have no concern for pneumonia or sepsis.  COVID flu RSV test obtained and patient is positive for RSV.  Will treat with Decadron for cough.  Will prescribe Tessalon Perles.  Her biggest issue is with her cough at this time.  Overall this is from a viral process.  Recommend continue supportive care at home Tylenol ibuprofen and rest and hydration.  Discharged in good condition.  Understands return precautions.  This chart  was dictated using voice recognition software.  Despite best efforts to proofread,  errors can occur which can change the documentation meaning.         Final Clinical Impression(s) / ED Diagnoses Final diagnoses:  RSV (acute bronchiolitis due to respiratory syncytial virus)    Rx / DC Orders ED Discharge Orders          Ordered    benzonatate (TESSALON) 100 MG capsule  Every 8 hours        08/09/23 0649              Virgina Norfolk, DO 08/09/23 [phone]

## 2023-08-09 NOTE — ED Triage Notes (Signed)
 Pt is coming in for flu like symptoms she has had for a few day now, symptoms include a cough, nasal congestion, fluid on her ears. She mentions that the cough is keeping her up at night and its causing her throat to become irritated. She also endorses having a lot of phlegm. Some minor chills and aches at home bu no confirmed fevers. Pt has taken advil last night around 2100.

## 2023-08-24 ENCOUNTER — Other Ambulatory Visit: Payer: Self-pay

## 2023-08-24 DIAGNOSIS — C50412 Malignant neoplasm of upper-outer quadrant of left female breast: Secondary | ICD-10-CM

## 2023-08-25 ENCOUNTER — Encounter: Payer: Self-pay | Admitting: Nurse Practitioner

## 2023-08-25 ENCOUNTER — Inpatient Hospital Stay: Payer: Medicaid Other

## 2023-08-25 ENCOUNTER — Inpatient Hospital Stay: Payer: Medicaid Other | Attending: Nurse Practitioner | Admitting: Nurse Practitioner

## 2023-08-25 VITALS — BP 122/88 | HR 86 | Temp 97.8°F | Resp 17 | Wt 220.8 lb

## 2023-08-25 DIAGNOSIS — Z9221 Personal history of antineoplastic chemotherapy: Secondary | ICD-10-CM | POA: Insufficient documentation

## 2023-08-25 DIAGNOSIS — C50412 Malignant neoplasm of upper-outer quadrant of left female breast: Secondary | ICD-10-CM | POA: Diagnosis present

## 2023-08-25 DIAGNOSIS — Z1722 Progesterone receptor negative status: Secondary | ICD-10-CM | POA: Insufficient documentation

## 2023-08-25 DIAGNOSIS — Z171 Estrogen receptor negative status [ER-]: Secondary | ICD-10-CM | POA: Diagnosis not present

## 2023-08-25 DIAGNOSIS — Z1211 Encounter for screening for malignant neoplasm of colon: Secondary | ICD-10-CM | POA: Diagnosis not present

## 2023-08-25 DIAGNOSIS — Z90710 Acquired absence of both cervix and uterus: Secondary | ICD-10-CM | POA: Insufficient documentation

## 2023-08-25 DIAGNOSIS — Z923 Personal history of irradiation: Secondary | ICD-10-CM | POA: Insufficient documentation

## 2023-08-25 DIAGNOSIS — Z9013 Acquired absence of bilateral breasts and nipples: Secondary | ICD-10-CM | POA: Diagnosis not present

## 2023-08-25 DIAGNOSIS — Z1732 Human epidermal growth factor receptor 2 negative status: Secondary | ICD-10-CM | POA: Insufficient documentation

## 2023-08-25 DIAGNOSIS — Z90722 Acquired absence of ovaries, bilateral: Secondary | ICD-10-CM | POA: Insufficient documentation

## 2023-08-25 LAB — CBC WITH DIFFERENTIAL (CANCER CENTER ONLY)
Abs Immature Granulocytes: 0.03 10*3/uL (ref 0.00–0.07)
Basophils Absolute: 0.1 10*3/uL (ref 0.0–0.1)
Basophils Relative: 1 %
Eosinophils Absolute: 0.1 10*3/uL (ref 0.0–0.5)
Eosinophils Relative: 2 %
HCT: 42.8 % (ref 36.0–46.0)
Hemoglobin: 14.6 g/dL (ref 12.0–15.0)
Immature Granulocytes: 0 %
Lymphocytes Relative: 28 %
Lymphs Abs: 2.1 10*3/uL (ref 0.7–4.0)
MCH: 29 pg (ref 26.0–34.0)
MCHC: 34.1 g/dL (ref 30.0–36.0)
MCV: 84.9 fL (ref 80.0–100.0)
Monocytes Absolute: 0.5 10*3/uL (ref 0.1–1.0)
Monocytes Relative: 6 %
Neutro Abs: 4.7 10*3/uL (ref 1.7–7.7)
Neutrophils Relative %: 63 %
Platelet Count: 289 10*3/uL (ref 150–400)
RBC: 5.04 MIL/uL (ref 3.87–5.11)
RDW: 12.2 % (ref 11.5–15.5)
WBC Count: 7.5 10*3/uL (ref 4.0–10.5)
nRBC: 0 % (ref 0.0–0.2)

## 2023-08-25 LAB — CMP (CANCER CENTER ONLY)
ALT: 29 U/L (ref 0–44)
AST: 17 U/L (ref 15–41)
Albumin: 4.1 g/dL (ref 3.5–5.0)
Alkaline Phosphatase: 81 U/L (ref 38–126)
Anion gap: 8 (ref 5–15)
BUN: 18 mg/dL (ref 6–20)
CO2: 26 mmol/L (ref 22–32)
Calcium: 9 mg/dL (ref 8.9–10.3)
Chloride: 105 mmol/L (ref 98–111)
Creatinine: 0.89 mg/dL (ref 0.44–1.00)
GFR, Estimated: >60 mL/min (ref >60)
Glucose, Bld: 100 mg/dL — ABNORMAL HIGH (ref 70–99)
Potassium: 3.9 mmol/L (ref 3.5–5.1)
Sodium: 139 mmol/L (ref 135–145)
Total Bilirubin: 0.3 mg/dL (ref 0.0–1.2)
Total Protein: 7.8 g/dL (ref 6.5–8.1)

## 2023-08-25 NOTE — Progress Notes (Signed)
 CLINIC:  Survivorship   REASON FOR VISIT:  Long term follow-up history of breast cancer.  BRIEF ONCOLOGIC HISTORY:  Oncology History Overview Note  Cancer Staging Malignant neoplasm of upper-outer quadrant of left breast in female, estrogen receptor positive (HCC) Staging form: Breast, AJCC 8th Edition - Clinical stage from 12/29/2016: Stage IIIA (cT2, cN1, cM0, G3, ER: Positive, PR: Negative, HER2: Negative) - Signed by Malachy Mood, MD on 01/07/2017 - Pathologic: No Stage Recommended (ypT1c, pN0, cM0, G2, ER-, PR-, HER2-) - Unsigned Cancer Staging Malignant neoplasm of upper-outer quadrant of left breast in female, estrogen receptor positive (HCC) Staging form: Breast, AJCC 8th Edition - Clinical stage from 12/29/2016: Stage IIIA (cT2, cN1, cM0, G3, ER: Positive, PR: Negative, HER2: Negative) - Signed by Malachy Mood, MD on 01/07/2017 - Pathologic: No Stage Recommended (ypT1c, pN0, cM0, G2, ER-, PR-, HER2-) - Unsigned     Malignant neoplasm of upper-outer quadrant of left breast in female, estrogen receptor positive (HCC)  12/25/2016 Mammogram   [place] and [facility] Diagnostic Breast Tomo Bilateral 12/25/16 IMPRESSION: 1. Suspicious mass 2.3 x 1.6 x 3.0 cm  in the 130 o'clock location of the left breast 8cm from the nipple, biopsy recommended. Adjacent simple cyst is 1.9 cm. 2. Suspicious left axillary lymph node 2.2cm for which biopsy is indicated. 3. Indeterminate group of calcifications in the upper central portion of the left breast for which biopsy is recommended.    12/29/2016 Initial Biopsy   Diagnosis 12/29/16 1. Breast, left, needle core biopsy, stereotactic, upper inner quadrant - FIBROCYSTIC CHANGES INCLUDING APOCRINE METAPLASIA WITH CALCIFICATIONS - NO MALIGNANCY IDENTIFIED 2. Breast, left, needle core biopsy, ultrasound, 1:30 o'clock - INVASIVE MAMMARY CARCINOMA, G3 - SEE COMMENT 3. Lymph node, needle/core biopsy, ultrasound, left axillary - METASTIC CARCINOMA INVOLVING ONE LYMPH NODE  (1/1) E-cadherin is positive supporting a ductal origin.    12/29/2016 Receptors her2   ER 15%+, weak staining  PR - HER2- Ki67 10%    12/29/2016 Initial Diagnosis   Malignant neoplasm of upper-outer quadrant of left breast in female, estrogen receptor positive (HCC)   01/13/2017 Echocardiogram   ECHO 01/13/17 Study Conclusions  - Left ventricle: The cavity size was normal. Systolic function was   normal. The estimated ejection fraction was in the range of 60%   to 65%. Wall motion was normal; there were no regional wall   motion abnormalities. Left ventricular diastolic function   parameters were normal. - Mitral valve: There was trivial regurgitation. - Right atrium: The atrium was mildly dilated. - Tricuspid valve: There was trivial regurgitation.    01/14/2017 Surgery   Port placement by Dr. Dwain Sarna    01/15/2017 Imaging   Whole Body bone Scan 01/15/17  IMPRESSION: 1.  No significant abnormality identified.   01/15/2017 Imaging   CT CAP W Contrast 01/15/17 IMPRESSION: 1. Left lateral breast mass with pathologic left axillary adenopathy including a 2.0 cm left axillary lymph node. 2. Several hypodense lesions in the liver are likely benign lesions such as cysts. However, these are technically too small to characterize by CT. Possibilities for further workup include surveillance or hepatic protocol MRI with and without contrast. 3. There is also a tiny hypodense lesion in the spleen which is technically nonspecific but statistically highly likely to be benign. 4.  Prominent stool throughout the colon favors constipation   01/16/2017 - 06/05/2017 Chemotherapy   Neoadjuvant chemo AC every 2 weeks for 4 cycles starting on 01/16/2017 and ended 02/27/17. Then proceed with weekly taxol for 12  treatments starting 03/13/17. Added Carboplatin AUC 2 on 04/03/17 to weekly taxol. Due to symptomatic thrombocytopenia we held Carbo since cycle 8. Cycle 9 was post-poned due to neutropenia and  Granix was added starting 05/12/17. Added carbo back at reduced dose AUC 1.5 with cycle 10 on 05/22/17. 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   Analysis also detected a Variant of Uncertain Significance (VUS) in the NBN gene called c.628G>T (p.Val210Phe)  Update: The Variant of Uncertain Significance NBN c.628G>T (p.Val210Phe) has been reclassified to "Likely Benign." Report date is 04/09/2018.    06/12/2017 Imaging   MR BREAST BILATERAL WO CONTRAST IMPRESSION: 1. Significant positive response to neoadjuvant chemotherapy. The index carcinoma in the left breast has significantly decreased in size. Left axillary adenopathy has also significantly improved. 2. No other evidence of malignancy.   07/16/2017 Surgery   Pt underwent a bilateral mastectomy and left SLN biopsy with Dr. Dwain Sarna   07/16/2017 Pathology Results   Diagnosis 07/16/17 1. Breast, simple mastectomy, Right Prophylactic - FIBROCYSTIC CHANGES INCLUDING APOCRINE METAPLASIA - DUCT ECTASIA - CALCIFICATIONS - NO MALIGNANCY IDENTIFIED 2. Lymph node, sentinel, biopsy, Left Axillary - NO CARCINOMA IDENTIFIED IN ONE LYMPH NODE (0/1) - SEE COMMENT 3. Lymph node, sentinel, biopsy, Left - NO CARCINOMA IDENTIFIED IN ONE LYMPH NODE (0/1) - SEE COMMENT 4. Breast, simple mastectomy, Left - INVASIVE DUCTAL CARCINOMA, NOTTINGHAM GRADE 2 OF 3, 1.3 CM - MARGINS UNINVOLVED BY CARCINOMA (2 CM POSTERIOR MARGIN) - PREVIOUS BIOPSY SITE CHANGES - CALCIFICATIONS - SEE ONCOLOGY TABLE BELOW   07/16/2017 Receptors her2   Results: IMMUNOHISTOCHEMICAL AND MORPHOMETRIC ANALYSIS PERFORMED MANUALLY Estrogen Receptor: 0%, NEGATIVE Progesterone Receptor: 0%, NEGATIVE Results: HER2 - NEGATIVE RATIO OF HER2/CEP17 SIGNALS 1.73 AVERAGE HER2 COPY NUMBER PER CELL 2.67   09/02/2017 - 10/16/2017 Radiation Therapy   Adjuvant  radiation per Dr. Mitzi Hansen    11/03/2017 Surgery   XI ROBOTIC ASSISTED TOTAL HYSTERECTOMY WITH BILATERAL SALPINGO OOPHORECTOMY  by Dr. Andrey Farmer and Dr. Tamela Oddi 11/03/17   11/23/2017 - 02/08/2018 Chemotherapy   Adjuvant Xeloda 2000mg  BID 2 weeks on and 1 week off   06/25/2018 Surgery   Bilateral breast reconstruction with Dr. Leta Baptist  06/25/18      INTERVAL HISTORY:  Ms. Benevides presents to the Survivorship Clinic today for to review her survivorship care plan detailing her treatment course for breast cancer, as well as monitoring long-term side effects of that treatment, education regarding health maintenance, screening, and overall wellness and health promotion.     Overall, Ms. Amesquita reports feeling quite well overall. She has no obvious long-term side effects of chemo or radiation.  She did like to reduce the dose of Effexor, plans to discuss with PCP.  She skipped 2 days and felt well with no mood swings and would like to wean off.  She is also planning breast reconstruction in the future.  She is trying to exercise more for intentional weight loss as she feels this is attributing to joint aches and stiffness on standing and occasional cramps in her back at work.  Performs self exams and has no concerns today.  REVIEW OF SYSTEMS:  Review of Systems - Oncology Breast: Denies any new nodularity, masses, tenderness, or chest wall nodularity.      ONCOLOGY TREATMENT TEAM:  1. Surgeon:  Dr. Dwain Sarna at [facility] 2. Medical Oncologist: Dr. Mosetta Putt 3. Radiation Oncologist: Dr. Mitzi Hansen  PAST MEDICAL/SURGICAL HISTORY:  Past Medical History:  Diagnosis Date   Breast cancer, left breast (HCC) dx'd 01/2017   Complication of anesthesia    Dermatologic problem    possible psoriasis. Pt has not had been diagnosed by dermatology.   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   GERD (gastroesophageal reflux  disease)    occational   PONV (postoperative nausea and vomiting)    Tobacco dependence    trying to quit with nicotene patches   UTI (urinary tract infection) 04/24/2017   Viral upper respiratory infection 03/11/2018   Past Surgical History:  Procedure Laterality Date   APPLICATION OF A-CELL OF CHEST/ABDOMEN Right 06/25/2018   Procedure: ACELLULAR DERMIS TO RIGHT CHEST;  Surgeon: Glenna Fellows, MD;  Location: [facility] OR;  Service: Plastics;  Laterality: Right;   BREAST BIOPSY Left 12/2016   BREAST IMPLANT EXCHANGE Right 06/24/2019   Procedure: REVISION RIGHT BREAST RECONSTRUCTION WITH SILICONE IMPLANT EXCAHNGE, STRATTICE TO RIGHT CHEST;  Surgeon: Glenna Fellows, MD;  Location: [facility];  Service: Plastics;  Laterality: Right;   BREAST RECONSTRUCTION Bilateral 07/16/2017   BILATERAL BREAST RECONSTRUCTION WITH PLACEMENT OF TISSUE EXPANDER AND ALLODERM/notes 07/16/2017   BREAST RECONSTRUCTION  06/25/2018   CESAREAN SECTION  2002   CHOLECYSTECTOMY N/A 02/05/2023   Procedure: LAPAROSCOPIC CHOLECYSTECTOMY;  Surgeon: [name], MD;  Location: [facility] OR;  Service: General;  Laterality: N/A;   LATISSIMUS FLAP TO BREAST Left 06/25/2018   Procedure: LEFT LATISSIMUS FLAP TO LEFT CHEST;  Surgeon: Glenna Fellows, MD;  Location: [facility] OR;  Service: Plastics;  Laterality: Left;   LIPOSUCTION Right 06/24/2019   Procedure: LIPOSUCTION RIGHT CHEST;  Surgeon: Glenna Fellows, MD;  Location: [facility];  Service: Plastics;  Laterality: Right;   MASTECTOMY Bilateral 07/16/2017   LEFT SKIN SPARING MASTECTOMY WITH LEFT RADIOACTIVE SEED TARGETED AXILLARY LYMPH NODE EXCISION AND AXILLARY SENTINEL LYMPH NODE BIOPSY; RIGHT PROPHYLACTIC SKIN SPARING MASTECTOMY Hattie Perch 07/16/2017   MASTECTOMY WITH RADIOACTIVE SEED GUIDED EXCISION AND AXILLARY SENTINEL LYMPH NODE BIOPSY Left 07/16/2017   Procedure: LEFT SKIN SPARING MASTECTOMY WITH LEFT RADIOACTIVE SEED TARGETED AXILLARY LYMPH NODE EXCISION  AND AXILLARY SENTINEL LYMPH NODE BIOPSY;  Surgeon: Emelia Loron, MD;  Location: [facility] OR;  Service: General;  Laterality: Left;   NIPPLE SPARING MASTECTOMY Right 07/16/2017   Procedure: RIGHT PROPHYLACTIC SKIN SPARING MASTECTOMY;  Surgeon: Emelia Loron, MD;  Location: [facility] OR;  Service: General;  Laterality: Right;   PORT-A-CATH REMOVAL N/A 07/16/2017   Procedure: REMOVAL PORT-A-CATH;  Surgeon: Emelia Loron, MD;  Location[facility] OR;  Service: General;  Laterality: N/A;   PORTA CATH REMOVAL  07/16/2017   PORTACATH PLACEMENT N/A 01/14/2017   Procedure: INSERTION PORT-A-CATH WITH [place];  Surgeon: Emelia Loron, MD;  Location: [facility];  Service: General;  Laterality: N/A;   REMOVAL OF BILATERAL TISSUE EXPANDERS WITH PLACEMENT OF BILATERAL BREAST IMPLANTS Bilateral 06/25/2018   Procedure: REMOVAL OF BILATERAL TISSUE EXPANDERS WITH PLACEMENT OF BILATERAL SILICONE BREAST IMPLANTS;  Surgeon: Glenna Fellows, MD;  Location: [facility];  Service: Plastics;  Laterality: Bilateral;   ROBOTIC ASSISTED TOTAL HYSTERECTOMY WITH BILATERAL SALPINGO OOPHERECTOMY Bilateral 11/03/2017   Procedure: XI ROBOTIC ASSISTED TOTAL HYSTERECTOMY WITH BILATERAL SALPINGO OOPHORECTOMY;  Surgeon: Adolphus Birchwood, MD;  Location: [facility];  Service: Gynecology;  Laterality: Bilateral;     ALLERGIES:  Allergies  Allergen Reactions   Amoxicillin Anaphylaxis, Rash and Other (See Comments)    PATIENT HAS HAD A PCN REACTION  WITH IMMEDIATE RASH, FACIAL/TONGUE/THROAT SWELLING, SOB, OR LIGHTHEADEDNESS WITH HYPOTENSION:  Yes Has patient had a PCN reaction causing severe rash involving mucus membranes or skin necrosis: No Has patient had a PCN reaction that required hospitalization: No Has patient had a PCN reaction occurring within the last 10 years: No If all of the above answers are "NO", then may proceed with Cephalosporin use.   Other Nausea And Vomiting and Other (See Comments)    Anesthesia-vomiting repeatedly with nausea.    Chlorhexidine Gluconate Rash   Nicotine Other (See Comments)    Patch caused soreness at application site     CURRENT MEDICATIONS:  Outpatient Encounter Medications as of 08/25/2023  Medication Sig   venlafaxine XR (EFFEXOR-XR) 150 MG 24 hr capsule Take 1 capsule (150 mg total) by mouth daily with breakfast.   benzonatate (TESSALON) 100 MG capsule Take 1 capsule (100 mg total) by mouth every 8 (eight) hours.   No facility-administered encounter medications on file as of 08/25/2023.     ONCOLOGIC FAMILY HISTORY:  Family History  Problem Relation Age of Onset   Stroke Maternal Grandmother    Stroke Father    Hypertension Father    Other Father        Adopted     GENETIC COUNSELING/TESTING: Yes, BRCA1 positive Her twin daughters are 16 and they are also positive  SOCIAL HISTORY:  NALAYAH HITT is married, lives with spouse and family.  She is currently working at [employer].  Quit smoking prior to breast cancer diagnosis.  Denies alcohol or other drug use.    PHYSICAL EXAMINATION:  Vital Signs:   Vitals:   08/25/23 1223  BP: 122/88  Pulse: 86  Resp: 17  Temp: 97.8 F (36.6 C)  SpO2: 98%   Filed Weights   08/25/23 1223  Weight: 220 lb 12.8 oz (100.2 kg)   General: Well-nourished, well-appearing female in no acute distress.   HEENT:   Sclerae anicteric.  Respiratory:  breathing non-labored.  Neuro: No focal deficits. Steady gait.  Psych: Mood and affect normal and appropriate for situation.  Extremities: No edema. MSK: Full range of motion in bilateral upper extremities Skin: Warm and dry. Breast exam: Patient declined  LABORATORY DATA:     Latest Ref Rng & Units 08/25/2023   11:53 AM 02/02/2023   10:30 AM 01/14/2023   10:01 PM  CBC  WBC 4.0 - 10.5 K/uL 7.5  4.2  7.9   Hemoglobin 12.0 - 15.0 g/dL 16.1  09.6  04.5   Hematocrit 36.0 - 46.0 % 42.8  42.5  40.4   Platelets 150 - 400 K/uL 289  244  251        Latest Ref Rng & Units 08/25/2023   11:53 AM  01/14/2023   10:01 PM 08/27/2022   10:10 AM  CMP  Glucose 70 - 99 mg/dL 409  811  914   BUN 6 - 20 mg/dL 18  17  24    Creatinine 0.44 - 1.00 mg/dL 7.82  9.56  2.13   Sodium 135 - 145 mmol/L 139  138  140   Potassium 3.5 - 5.1 mmol/L 3.9  4.0  4.2   Chloride 98 - 111 mmol/L 105  103  105   CO2 22 - 32 mmol/L 26  25  28    Calcium 8.9 - 10.3 mg/dL 9.0  9.1  8.9   Total Protein 6.5 - 8.1 g/dL 7.8  7.3  7.1   Total Bilirubin 0.0 - 1.2 mg/dL  0.3  0.4  0.3   Alkaline Phos 38 - 126 U/L 81  73  65   AST 15 - 41 U/L 17  106  16   ALT 0 - 44 U/L 29  111  24      DIAGNOSTIC IMAGING:  None for this visit.      ASSESSMENT AND PLAN:  Ms.. Lupinacci is a pleasant 48 y.o. 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  She presents to the Survivorship Clinic for long-term follow-up post-completion of treatment for breast cancer.    Stage III left breast cancer:  Ms. Harrell has recovered from definitive treatment for breast cancer. She will follow-up with medical oncology annually for at least a total of 10 years from diagnosis. Today, a comprehensive survivorship care plan and treatment summary was reviewed with the patient today detailing her breast cancer diagnosis, treatment course, potential late/long-term effects of treatment, appropriate follow-up care with recommendations for the future, and patient education resources.  A copy of this summary, along with a letter will be sent to the patient's primary care provider via mail/fax/In Basket message after today's visit.    Bone health:  Given Ms. Vasey's surgical menopause and history of chemo and other medications she is at risk for bone demineralization.  She can have this checked per PCP.  In the meantime, she was encouraged to take daily calcium and vitamin D, as well as increase her weight-bearing  activities.  She was given education on specific activities to promote bone health.  Cancer screening:  Due to Ms. Bibby's history and her age, she should receive screening for skin and colon cancers.  She accepts a referral to GI for for screening colonoscopy.  No role for mammograms s/p bilateral mastectomy and is s/p hysterectomy with BSO. Infrmation and recommendations are listed on the patient's comprehensive care plan/treatment summary and were reviewed in detail with the patient.    Health maintenance and wellness promotion: Ms. Anselmo was encouraged to consume 5-7 servings of fruits and vegetables per day. She was also encouraged to engage in moderate to vigorous exercise for 30 minutes per day most days of the week. We discussed the [employer] fitness program, which is designed for cancer survivors to help them become more physically fit after cancer treatments.  She was instructed to limit her alcohol consumption and continue to abstain from tobacco use   Support services/counseling: It is not uncommon for this period of the patient's cancer care trajectory to be one of many emotions and stressors.  We discussed an opportunity for her to participate in the next session of [facility] ("Finding Your New Normal") support group series designed for patients after they have completed treatment.   Ms. Bezold was encouraged to take advantage of our many other support services programs, support groups, and/or counseling in coping with her new life as a cancer survivor after completing anti-cancer treatment.  She was offered support today through active listening and expressive supportive counseling.  She was given information regarding our available services and encouraged to contact me with any questions or for help enrolling in any of our support group/programs.    Dispo:   -Return to cancer center in 1 year -Referral to GI for first screening colonoscopy  -Given info on [employer] program -Will  ask genetics to touch base with pt re: daughters who are BRCA+  -No role for mammo  s/p bilateral mastectomy, pt to f/up with plastics -Follow up with surgery as indicated  She is welcome to return back to the Survivorship Clinic at any time; no additional follow-up needed at this time. Consider referral back to survivorship as a long-term survivor for continued surveillance   Orders Placed This Encounter  Procedures   Ambulatory referral to Gastroenterology    Referral Priority:   Routine    Referral Type:   Consultation    Referral Reason:   Specialty Services Required    Number of Visits Requested:   1    A total of (40) minutes of face-to-face time was spent with this patient with greater than 50% of that time in counseling and care-coordination.   [name], NP Survivorship Program [facility] [phone]   Note: PRIMARY CARE PROVIDER Marrianne Mood, MD [phone]

## 2023-08-26 ENCOUNTER — Encounter: Payer: Self-pay | Admitting: Nurse Practitioner

## 2023-09-26 ENCOUNTER — Other Ambulatory Visit (HOSPITAL_COMMUNITY): Payer: Self-pay

## 2023-09-26 ENCOUNTER — Encounter: Payer: Self-pay | Admitting: Hematology

## 2023-10-27 ENCOUNTER — Encounter: Payer: Self-pay | Admitting: Nurse Practitioner

## 2023-11-09 ENCOUNTER — Encounter: Payer: Self-pay | Admitting: Hematology

## 2023-11-23 ENCOUNTER — Encounter: Payer: Self-pay | Admitting: Hematology

## 2023-11-26 ENCOUNTER — Other Ambulatory Visit (HOSPITAL_COMMUNITY): Payer: Self-pay

## 2023-11-26 ENCOUNTER — Ambulatory Visit

## 2023-11-26 ENCOUNTER — Other Ambulatory Visit: Payer: Self-pay | Admitting: Hematology

## 2023-11-26 VITALS — Ht 64.0 in | Wt 220.0 lb

## 2023-11-26 DIAGNOSIS — Z1211 Encounter for screening for malignant neoplasm of colon: Secondary | ICD-10-CM

## 2023-11-26 MED ORDER — NA SULFATE-K SULFATE-MG SULF 17.5-3.13-1.6 GM/177ML PO SOLN
1.0000 | Freq: Once | ORAL | 0 refills | Status: AC
  Filled 2023-11-26: qty 354, 1d supply, fill #0

## 2023-11-26 NOTE — Progress Notes (Unsigned)
 No egg or soy allergy known to patient  No issues known to pt with past sedation with any surgeries or procedures Patient denies ever being told they had issues or difficulty with intubation  No FH of Malignant Hyperthermia Pt is not on diet pills nor GLP-1 medications Pt is not on home 02  Pt is not on blood thinners  Pt states occasional issues with chronic constipation  No A fib or A flutter Have any cardiac testing pending--no Pt instructed to use Singlecare.com or GoodRx for a price reduction on prep  Ambulates independently

## 2023-11-27 ENCOUNTER — Other Ambulatory Visit (HOSPITAL_COMMUNITY): Payer: Self-pay

## 2023-11-27 MED ORDER — VENLAFAXINE HCL ER 150 MG PO CP24
150.0000 mg | ORAL_CAPSULE | Freq: Every day | ORAL | 5 refills | Status: DC
  Filled 2023-11-27: qty 30, 30d supply, fill #0
  Filled 2023-12-27: qty 30, 30d supply, fill #1
  Filled 2024-01-22 – 2024-01-23 (×2): qty 30, 30d supply, fill #2
  Filled 2024-02-25: qty 30, 30d supply, fill #3
  Filled 2024-03-22: qty 30, 30d supply, fill #4
  Filled 2024-04-25: qty 30, 30d supply, fill #5
  Filled ????-??-??: fill #3

## 2023-12-02 ENCOUNTER — Encounter: Payer: Self-pay | Admitting: Hematology

## 2023-12-10 ENCOUNTER — Ambulatory Visit: Admitting: Gastroenterology

## 2023-12-10 ENCOUNTER — Encounter: Payer: Self-pay | Admitting: Gastroenterology

## 2023-12-10 VITALS — BP 103/62 | HR 65 | Temp 97.0°F | Resp 10 | Ht 64.0 in | Wt 220.0 lb

## 2023-12-10 DIAGNOSIS — K5730 Diverticulosis of large intestine without perforation or abscess without bleeding: Secondary | ICD-10-CM

## 2023-12-10 DIAGNOSIS — D123 Benign neoplasm of transverse colon: Secondary | ICD-10-CM

## 2023-12-10 DIAGNOSIS — K635 Polyp of colon: Secondary | ICD-10-CM

## 2023-12-10 DIAGNOSIS — D125 Benign neoplasm of sigmoid colon: Secondary | ICD-10-CM

## 2023-12-10 DIAGNOSIS — Z1211 Encounter for screening for malignant neoplasm of colon: Secondary | ICD-10-CM | POA: Diagnosis not present

## 2023-12-10 MED ORDER — SODIUM CHLORIDE 0.9 % IV SOLN: 500.0000 mL | INTRAVENOUS | Status: DC

## 2023-12-10 NOTE — Patient Instructions (Signed)

## 2023-12-10 NOTE — Progress Notes (Signed)
 Called to room to assist during endoscopic procedure.  Patient ID and intended procedure confirmed with present staff. Received instructions for my participation in the procedure from the performing physician.

## 2023-12-10 NOTE — Progress Notes (Signed)
 Sedate, gd SR, tolerated procedure well, VSS, report to RN

## 2023-12-10 NOTE — Progress Notes (Signed)
 Pt's states no medical or surgical changes since previsit or office visit.

## 2023-12-10 NOTE — Progress Notes (Signed)
 [facility] History and Physical   Primary Care Physician:  Norrine Sharper, MD   Reason for Procedure:   Colon cancer screening  Plan:    Screening colonoscopy     HPI: Kathryn Stanley is a 48 y.o. female undergoing initial average risk screening colonoscopy.  She has no family history of colon cancer and no chronic GI symptoms.  She has a history of breast cancer/BRCA1 mutation.     Past Medical History:  Diagnosis Date   Breast cancer, left breast (HCC) dx'd 01/2017   Complication of anesthesia    Dermatologic problem    possible psoriasis. Pt has not had been diagnosed by dermatology.   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   GERD (gastroesophageal reflux disease)    occational   PONV (postoperative nausea and vomiting)    Tobacco dependence    trying to quit with nicotene patches   UTI (urinary tract infection) 04/24/2017   Viral upper respiratory infection 03/11/2018    Past Surgical History:  Procedure Laterality Date   APPLICATION OF A-CELL OF CHEST/ABDOMEN Right 06/25/2018   Procedure: ACELLULAR DERMIS TO RIGHT CHEST;  Surgeon: Arelia Filippo, MD;  Location: [facility] OR;  Service: Plastics;  Laterality: Right;   BREAST BIOPSY Left 12/2016   BREAST IMPLANT EXCHANGE Right 06/24/2019   Procedure: REVISION RIGHT BREAST RECONSTRUCTION WITH SILICONE IMPLANT EXCAHNGE, STRATTICE TO RIGHT CHEST;  Surgeon: Arelia Filippo, MD;  Location: [facility];  Service: Plastics;  Laterality: Right;   BREAST RECONSTRUCTION Bilateral 07/16/2017   BILATERAL BREAST RECONSTRUCTION WITH PLACEMENT OF TISSUE EXPANDER AND ALLODERM/notes 07/16/2017   BREAST RECONSTRUCTION  06/25/2018   CESAREAN SECTION  2002   CHOLECYSTECTOMY N/A 02/05/2023   Procedure: LAPAROSCOPIC CHOLECYSTECTOMY;  Surgeon: Vernetta Berg, MD;  Location: [facility];  Service: General;  Laterality: N/A;   LATISSIMUS FLAP TO BREAST  Left 06/25/2018   Procedure: LEFT LATISSIMUS FLAP TO LEFT CHEST;  Surgeon: Arelia Filippo, MD;  Location: [facility];  Service: Plastics;  Laterality: Left;   LIPOSUCTION Right 06/24/2019   Procedure: LIPOSUCTION RIGHT CHEST;  Surgeon: Arelia Filippo, MD;  Location: [facility];  Service: Plastics;  Laterality: Right;   MASTECTOMY Bilateral 07/16/2017   LEFT SKIN SPARING MASTECTOMY WITH LEFT RADIOACTIVE SEED TARGETED AXILLARY LYMPH NODE EXCISION AND AXILLARY SENTINEL LYMPH NODE BIOPSY; RIGHT PROPHYLACTIC SKIN SPARING MASTECTOMY thelbert 07/16/2017   MASTECTOMY WITH RADIOACTIVE SEED GUIDED EXCISION AND AXILLARY SENTINEL LYMPH NODE BIOPSY Left 07/16/2017   Procedure: LEFT SKIN SPARING MASTECTOMY WITH LEFT RADIOACTIVE SEED TARGETED AXILLARY LYMPH NODE EXCISION AND AXILLARY SENTINEL LYMPH NODE BIOPSY;  Surgeon: Ebbie Cough, MD;  Location: [facility];  Service: General;  Laterality: Left;   NIPPLE SPARING MASTECTOMY Right 07/16/2017   Procedure: RIGHT PROPHYLACTIC SKIN SPARING MASTECTOMY;  Surgeon: Ebbie Cough, MD;  Location: [facility] OR;  Service: General;  Laterality: Right;   PORT-A-CATH REMOVAL N/A 07/16/2017   Procedure: REMOVAL PORT-A-CATH;  Surgeon: Ebbie Cough, MD;  Location: [facility] OR;  Service: General;  Laterality: N/A;   PORTA CATH REMOVAL  07/16/2017   PORTACATH PLACEMENT N/A 01/14/2017   Procedure: INSERTION PORT-A-CATH WITH US ;  Surgeon: Ebbie Cough, MD;  Location: [facility];  Service: General;  Laterality: N/A;   REMOVAL OF BILATERAL TISSUE EXPANDERS WITH PLACEMENT OF BILATERAL BREAST IMPLANTS Bilateral 06/25/2018   Procedure: REMOVAL OF BILATERAL TISSUE EXPANDERS WITH PLACEMENT OF BILATERAL SILICONE BREAST IMPLANTS;  Surgeon: Arelia Filippo, MD;  Location: [facility];  Service: Government social research officer;  Laterality: Bilateral;   ROBOTIC ASSISTED TOTAL HYSTERECTOMY WITH BILATERAL SALPINGO OOPHERECTOMY Bilateral 11/03/2017   Procedure: XI ROBOTIC ASSISTED TOTAL HYSTERECTOMY WITH BILATERAL SALPINGO  OOPHORECTOMY;  Surgeon: Eloy Herring, MD;  Location: [facility];  Service: Gynecology;  Laterality: Bilateral;    Prior to Admission medications   Medication Sig Start Date End Date Taking? Authorizing Provider  venlafaxine  XR (EFFEXOR -XR) 150 MG 24 hr capsule Take 1 capsule (150 mg total) by mouth daily with breakfast. 11/27/23  Yes Burton, Lacie K, NP    Current Outpatient Medications  Medication Sig Dispense Refill   venlafaxine  XR (EFFEXOR -XR) 150 MG 24 hr capsule Take 1 capsule (150 mg total) by mouth daily with breakfast. 30 capsule 5   Current Facility-Administered Medications  Medication Dose Route Frequency Provider Last Rate Last Admin   0.9 %  sodium chloride  infusion  500 mL Intravenous Continuous Stacia Glendia BRAVO, MD        Allergies as of 12/10/2023 - Review Complete 12/10/2023  Allergen Reaction Noted   Amoxicillin Anaphylaxis, Rash, and Other (See Comments) 12/25/2016   Other Nausea And Vomiting and Other (See Comments) 10/22/2017   Chlorhexidine  gluconate Rash 03/27/2017   Nicotine  Other (See Comments) 01/09/2017    Family History  Problem Relation Age of Onset   Stroke Father    Hypertension Father    Other Father        Adopted   Stroke Maternal Grandmother    Colon cancer Neg Hx    Esophageal cancer Neg Hx    Rectal cancer Neg Hx    Stomach cancer Neg Hx    Colon polyps Neg Hx     Social History   Socioeconomic History   Marital status: Married    Spouse name: Not on file   Number of children: Not on file   Years of education: Not on file   Highest education level: Not on file  Occupational History   Not on file  Tobacco Use   Smoking status: Former    Current packs/day: 0.00    Average packs/day: 1 pack/day for 17.0 years (17.0 ttl pk-yrs)    Types: Cigarettes    Start date: 12/16/1999    Quit date: 12/15/2016    Years since quitting: 6.9    Passive exposure: Never   Smokeless tobacco: Never  Vaping Use   Vaping status: Never Used  Substance  and Sexual Activity   Alcohol use: Yes    Comment: 1 Bahama Mama drink per week   Drug use: No   Sexual activity: Yes    Birth control/protection: None  Other Topics Concern   Not on file  Social History Narrative   Not on file   Social Drivers of Health   Financial Resource Strain: Not on file  Food Insecurity: Not on file  Transportation Needs: Not on file  Physical Activity: Not on file  Stress: Not on file  Social Connections: Not on file  Intimate Partner Violence: Not on file    Review of Systems:  All other review of systems negative except as mentioned in the HPI.  Physical Exam: Vital signs BP 134/76   Pulse 79   Temp (!) 97 F (36.1 C)   Ht 5' 4 (1.626 m)   Wt 220 lb (99.8 kg)   LMP 01/07/2017 (Approximate)   SpO2 100%   BMI 37.76 kg/m   General:   Alert,  Well-developed, well-nourished, pleasant and cooperative in NAD Airway:  Mallampati 2 Lungs:  Clear throughout to auscultation.   Heart:  Regular rate and rhythm; no murmurs, clicks, rubs,  or gallops. Abdomen:  Soft, nontender and nondistended. Normal bowel sounds.   Neuro/Psych:  Normal mood and affect. A and O x 3   Helena Sardo E. Stacia, MD [facility]

## 2023-12-10 NOTE — Op Note (Signed)
 [facility] Patient Name: Kathryn Stanley Procedure Date: 12/10/2023 8:17 AM MRN: [id number] Endoscopist: Glendia E. Stacia , MD, [id number] Age: 48 Referring MD:  Date of Birth: 09-Apr-1976 Gender: Female Account #: [id number] Procedure:                Colonoscopy Indications:              Screening for colorectal malignant neoplasm, This                            is the patient's first colonoscopy Medicines:                Monitored Anesthesia Care Procedure:                Pre-Anesthesia Assessment:                           - Prior to the procedure, a History and Physical                            was performed, and patient medications and                            allergies were reviewed. The patient's tolerance of                            previous anesthesia was also reviewed. The risks                            and benefits of the procedure and the sedation                            options and risks were discussed with the patient.                            All questions were answered, and informed consent                            was obtained. Prior Anticoagulants: The patient has                            taken no anticoagulant or antiplatelet agents. ASA                            Grade Assessment: II - A patient with mild systemic                            disease. After reviewing the risks and benefits,                            the patient was deemed in satisfactory condition to                            undergo the procedure.  After obtaining informed consent, the colonoscope                            was passed under direct vision. Throughout the                            procedure, the patient's blood pressure, pulse, and                            oxygen saturations were monitored continuously. The                            Olympus Scope SN: [id number] was introduced through                            the anus and  advanced to the the terminal ileum,                            with identification of the appendiceal orifice and                            IC valve. The colonoscopy was performed without                            difficulty. The patient tolerated the procedure                            well. The quality of the bowel preparation was                            good. The terminal ileum, ileocecal valve,                            appendiceal orifice, and rectum were photographed.                            The bowel preparation used was SUPREP via split                            dose instruction. Scope In: 8:26:40 AM Scope Out: 8:42:58 AM Scope Withdrawal Time: 0 hours 12 minutes 9 seconds  Total Procedure Duration: 0 hours 16 minutes 18 seconds  Findings:                 The perianal and digital rectal examinations were                            normal. Pertinent negatives include normal                            sphincter tone and no palpable rectal lesions.                           A 5 mm polyp was found in the proximal transverse  colon. The polyp was sessile. The polyp was removed                            with a cold snare. Resection and retrieval were                            complete. Estimated blood loss was minimal.                           A 3 mm polyp was found in the sigmoid colon. The                            polyp was sessile. The polyp was removed with a                            cold snare. Resection and retrieval were complete.                            Estimated blood loss was minimal.                           A few small-mouthed diverticula were found in the                            sigmoid colon.                           The exam was otherwise normal throughout the                            examined colon.                           The terminal ileum appeared normal.                           The retroflexed view of the  distal rectum and anal                            verge was normal and showed no anal or rectal                            abnormalities. Complications:            No immediate complications. Estimated Blood Loss:     Estimated blood loss was minimal. Impression:               - One 5 mm polyp in the proximal transverse colon,                            removed with a cold snare. Resected and retrieved.                           - One 3 mm polyp in the sigmoid colon, removed with  a cold snare. Resected and retrieved.                           - Mild diverticulosis in the sigmoid colon.                           - The examined portion of the ileum was normal.                           - The distal rectum and anal verge are normal on                            retroflexion view. Recommendation:           - Patient has a contact number available for                            emergencies. The signs and symptoms of potential                            delayed complications were discussed with the                            patient. Return to normal activities tomorrow.                            Written discharge instructions were provided to the                            patient.                           - Resume previous diet.                           - Continue present medications.                           - Await pathology results.                           - Repeat colonoscopy (date not yet determined) for                            surveillance based on pathology results. Rikki Smestad E. Stacia, MD 12/10/2023 8:47:56 AM This report has been signed electronically.

## 2023-12-14 ENCOUNTER — Telehealth: Payer: Self-pay | Admitting: *Deleted

## 2023-12-14 NOTE — Telephone Encounter (Signed)
  Follow up Call-     12/10/2023    7:53 AM  Call back number  Post procedure Call Back phone  # [phone]  Permission to leave phone message Yes     Patient questions:  Do you have a fever, pain , or abdominal swelling? No. Pain Score  0 *  Have you tolerated food without any problems? Yes.    Have you been able to return to your normal activities? Yes.    Do you have any questions about your discharge instructions: Diet   No. Medications  No. Follow up visit  No.  Do you have questions or concerns about your Care? No.  Actions: * If pain score is 4 or above: No action needed, pain <4.

## 2023-12-16 LAB — SURGICAL PATHOLOGY

## 2023-12-17 ENCOUNTER — Ambulatory Visit: Payer: Self-pay | Admitting: Gastroenterology

## 2023-12-17 NOTE — Progress Notes (Signed)
 Kathryn Stanley,  One polyp which I removed during your recent procedure was proven to be completely benign but is considered a pre-cancerous polyp that MAY have grown into cancer if it had not been removed. The other polyp was not precancerous.   Studies shows that at least 20% of women over age 46 and 30% of men over age 20 have pre-cancerous polyps.  Based on current nationally recognized surveillance guidelines, I recommend that you have a repeat colonoscopy in 7 years.   If you develop any new rectal bleeding, abdominal pain or significant bowel habit changes, please contact me before then.

## 2023-12-27 ENCOUNTER — Encounter: Payer: Self-pay | Admitting: Student

## 2023-12-28 ENCOUNTER — Encounter: Payer: Self-pay | Admitting: Hematology

## 2023-12-28 ENCOUNTER — Other Ambulatory Visit (HOSPITAL_COMMUNITY): Payer: Self-pay

## 2023-12-28 ENCOUNTER — Ambulatory Visit: Admitting: Student

## 2023-12-28 VITALS — BP 142/87 | HR 73 | Temp 97.7°F | Wt 222.4 lb

## 2023-12-28 DIAGNOSIS — S29019A Strain of muscle and tendon of unspecified wall of thorax, initial encounter: Secondary | ICD-10-CM

## 2023-12-28 DIAGNOSIS — R109 Unspecified abdominal pain: Secondary | ICD-10-CM

## 2023-12-28 LAB — POCT URINALYSIS DIPSTICK
Bilirubin, UA: NEGATIVE
Blood, UA: NEGATIVE
Glucose, UA: NEGATIVE
Ketones, UA: NEGATIVE
Leukocytes, UA: NEGATIVE
Nitrite, UA: NEGATIVE
Protein, UA: NEGATIVE
Spec Grav, UA: 1.025 (ref 1.010–1.025)
Urobilinogen, UA: 0.2 U/dL
pH, UA: 6 (ref 5.0–8.0)

## 2023-12-28 MED ORDER — LIDOCAINE 5 % EX PTCH
1.0000 | MEDICATED_PATCH | CUTANEOUS | 0 refills | Status: AC
  Filled 2023-12-28: qty 10, 10d supply, fill #0

## 2023-12-28 MED ORDER — METHOCARBAMOL 500 MG PO TABS
500.0000 mg | ORAL_TABLET | Freq: Three times a day (TID) | ORAL | 0 refills | Status: AC
  Filled 2023-12-28: qty 15, 5d supply, fill #0

## 2023-12-28 NOTE — Progress Notes (Unsigned)
   Acute Office Visit  Subjective:     Patient ID: Kathryn Stanley, female    DOB: 1975-06-15, 48 y.o.   MRN: [id number]  No chief complaint on file.   HPI This is a 48 year old female with past medical history of OSA, tobacco use, left breast cancer, mixed hyperlipidemia,, vitamin D  deficiency presenting today for acute visit for back pain.  Last office visit 10/21/2021  ROS As per assessment and plan Objective:    BP (!) 149/87 (BP Location: Right Arm, Patient Position: Sitting, Cuff Size: Small)   Pulse 76   Temp 97.7 F (36.5 C) (Oral)   Wt 222 lb 6.4 oz (100.9 kg)   LMP 01/07/2017 (Approximate)   SpO2 97%   BMI 38.17 kg/m  BP Readings from Last 3 Encounters:  12/28/23 (!) 149/87  12/10/23 103/62  08/25/23 122/88   Wt Readings from Last 3 Encounters:  12/28/23 222 lb 6.4 oz (100.9 kg)  12/10/23 220 lb (99.8 kg)  11/26/23 220 lb (99.8 kg)      Physical Exam General: Sitting in chair, no acute distress Cardiovascular: Regular rate Pulmonary: Breathing comfortably Abdomen: Soft, nontender, nondistended, bowel sounds present MSK: + Left lower thoracic paraspinal tenderness, localized.  Assessment & Plan:   - Onset Saturday night - Left sided back pain - NO radiation  - NO fevrs or chills - No hematurkia, dysuria, no urinary symptoms - No history of kidney stones - Diet sodas and tea   Exam: +Mild Left CVA tenderness. STONE score 4%, low risk for ureteral stone   Plan: - POC Urinalysis: Infection and hematuria - CBC and RFP   Problem List Items Addressed This Visit   None   No orders of the defined types were placed in this encounter.   No follow-ups on file.  Toma Edwards, DO

## 2023-12-28 NOTE — Patient Instructions (Addendum)
 Thank you, Ms.Heavenly M Popp for allowing us  to provide your care today. Today we discussed:  For your Left back pain - Tylenol  500 mg, take 2 tablets by mouth every 6-8 hours for pain control - Advil  (ibuprofen ), take 1-2 tablets every 8 hours  - Start Robaxin  500 mg, take one tablet by mouth three times a day muscle pain  - Apply Lidocaine  patches at the area of pain   I have ordered the following labs for you:  Lab Orders         POCT Urinalysis Dipstick (18997)      Tests ordered today:  As above   Referrals ordered today:   Referral Orders  No referral(s) requested today     I have ordered the following medication/changed the following medications:   Stop the following medications: There are no discontinued medications.   Start the following medications: Meds ordered this encounter  Medications   lidocaine  (LIDODERM ) 5 %    Sig: Place 1 patch onto the skin daily for 10 days. Remove & Discard patch within 12 hours or as directed by MD    Dispense:  10 patch    Refill:  0   methocarbamol  (ROBAXIN ) 500 MG tablet    Sig: Take 1 tablet (500 mg total) by mouth 3 (three) times daily for 5 days.    Dispense:  15 tablet    Refill:  0     Follow up: if symptoms worsen     Remember:   Should you have any questions or concerns please call the internal medicine clinic at [phone].     Rayann Atway, D.O. [facility]

## 2023-12-29 DIAGNOSIS — S29019A Strain of muscle and tendon of unspecified wall of thorax, initial encounter: Secondary | ICD-10-CM | POA: Insufficient documentation

## 2023-12-29 NOTE — Assessment & Plan Note (Signed)
 Patient presents today with concerns about acute onset of left back pain started Saturday night.  Patient reports that the pain is localized to left mid back, describes it as a dull pain that is present constantly.  Denies any radiation.  Denies any fevers or chills, hematochezia, dysuria, or urinary symptoms.  Reports that she is able to do her ADLs, IADLs.  Denies any limitation with range of motion.  Denies any prior history of kidney stones.  On exam, patient has mild left CVA tenderness that is more medial than lateral to the lower thoracic spine.  No tenderness to palpation palpated along the spinous processes.  Pain is more palpated medial to the left lower thoracic spine.  No abdominal tenderness noted. STONE score 4%, low risk for ureteral stone.  Low suspicion for nephrolithiasis.  Point-of-care urinalysis negative for infection and hematuria. -Patient was reassured that there is low suspicion for nephrolithiasis and UTI - Based on the presentation and clinical findings, more likely MSK/myofascial related pain -Patient is advised to try conservative treatments such as lidocaine  patches, Voltaren gel, Advil -Tylenol . -Patient was prescribed Robaxin  500 mg 3 times daily for 5 days -Patient was encouraged to exercise -Patient was encouraged to return to clinic if her symptoms were to worsen

## 2023-12-30 NOTE — Progress Notes (Signed)
 Internal Medicine Clinic Attending  Case discussed with the resident at the time of the visit.  We reviewed the resident's history and exam and pertinent patient test results.  I agree with the assessment, diagnosis, and plan of care documented in the resident's note.

## 2024-01-04 ENCOUNTER — Ambulatory Visit: Payer: Self-pay

## 2024-01-04 ENCOUNTER — Ambulatory Visit: Admitting: Student

## 2024-01-04 ENCOUNTER — Other Ambulatory Visit (HOSPITAL_COMMUNITY): Payer: Self-pay

## 2024-01-04 ENCOUNTER — Other Ambulatory Visit: Payer: Self-pay

## 2024-01-04 ENCOUNTER — Ambulatory Visit: Payer: Self-pay | Admitting: Student

## 2024-01-04 VITALS — BP 155/108 | HR 81 | Ht 64.0 in | Wt 221.3 lb

## 2024-01-04 DIAGNOSIS — C50412 Malignant neoplasm of upper-outer quadrant of left female breast: Secondary | ICD-10-CM

## 2024-01-04 DIAGNOSIS — R030 Elevated blood-pressure reading, without diagnosis of hypertension: Secondary | ICD-10-CM | POA: Diagnosis not present

## 2024-01-04 DIAGNOSIS — B029 Zoster without complications: Secondary | ICD-10-CM

## 2024-01-04 DIAGNOSIS — Z170 Estrogen receptor positive status [ER+]: Secondary | ICD-10-CM | POA: Diagnosis not present

## 2024-01-04 MED ORDER — VALACYCLOVIR HCL 1 G PO TABS
1000.0000 mg | ORAL_TABLET | Freq: Three times a day (TID) | ORAL | 0 refills | Status: DC
  Filled 2024-01-04: qty 30, 10d supply, fill #0

## 2024-01-04 MED ORDER — VALACYCLOVIR HCL 1 G PO TABS
1000.0000 mg | ORAL_TABLET | Freq: Three times a day (TID) | ORAL | 0 refills | Status: AC
  Filled 2024-01-04 (×2): qty 30, 10d supply, fill #0

## 2024-01-04 MED ORDER — ACETAMINOPHEN 500 MG PO TABS
1000.0000 mg | ORAL_TABLET | Freq: Four times a day (QID) | ORAL | 2 refills | Status: DC | PRN
  Filled 2024-01-04: qty 100, 13d supply, fill #0

## 2024-01-04 MED ORDER — IBUPROFEN 200 MG PO TABS
400.0000 mg | ORAL_TABLET | Freq: Four times a day (QID) | ORAL | 0 refills | Status: DC | PRN
  Filled 2024-01-04: qty 30, 4d supply, fill #0

## 2024-01-04 MED ORDER — ACETAMINOPHEN 500 MG PO TABS: 1000.0000 mg | ORAL_TABLET | Freq: Four times a day (QID) | ORAL | Status: AC | PRN

## 2024-01-04 MED ORDER — IBUPROFEN 200 MG PO TABS: 400.0000 mg | ORAL_TABLET | Freq: Four times a day (QID) | ORAL | Status: AC | PRN

## 2024-01-04 NOTE — Assessment & Plan Note (Signed)
 In setting of acute shingles outbreak. Deferring workup and treatment for this at present.

## 2024-01-04 NOTE — Telephone Encounter (Signed)
 FYI Only or Action Required?: FYI only for provider.  Patient was last seen in primary care on 12/28/2023 by Kathryn Barren, DO.  Called Nurse Triage reporting Herpes Zoster.  Symptoms began Friday night.  Interventions attempted: Nothing.  Symptoms are: gradually worsening.  Triage Disposition: See Physician Within 24 Hours  Patient/caregiver understands and will follow disposition?:               Copied from CRM #[id number]. Topic: Clinical - Red Word Triage >> Jan 04, 2024  8:26 AM [name] wrote: [facility] that prompted transfer to Nurse Triage:   Back pain; and rash on flank area that wraps around on left side Reason for Disposition  SEVERE pain (e.g., excruciating)  Answer Assessment - Initial Assessment Questions 1. APPEARANCE of RASH: What does the rash look like?      Blistered red  2. LOCATION: Where is the rash located?      Left side/flank 3. ONSET: When did the rash start?      Friday night  4. ITCHING: Does the rash itch? If Yes, ask: How bad is the itch?  (Scale 1-10; or mild, moderate, severe)     Mild  5. PAIN: Does the rash hurt? If Yes, ask: How bad is the pain?  (Scale 0-10; or none, mild, moderate, severe)     severe 6. OTHER SYMPTOMS: Do you have any other symptoms? (e.g., fever)     no  Protocols used: Shingles (Zoster)-A-AH

## 2024-01-04 NOTE — Assessment & Plan Note (Signed)
 Shingles outbreak on left flank. Rash started ~48 hours ago. No obvious reason for underlying immunocompromise but I deferred discussion about HIV testing because her husband and daughter accompanied her to today's visit. She'll keep rash carefully covered and wash hands frequently, avoid contact with immunocompromised people until lesions are crusted. -start acetaminophen  1,000 mg q6 h -start advil  400 mg q6 h -start valacyclovir  1,000 mg tid x 10 days

## 2024-01-04 NOTE — Assessment & Plan Note (Signed)
 Status post bilateral mastectomy and hysterectomy. Completed neoadjuvant chemotherapy and adjuvant Xeloda .

## 2024-01-04 NOTE — Progress Notes (Signed)
  Patient name: Kathryn Stanley Date of birth: 12/06/1975 Date of visit: 01/04/24  Subjective   Chief concern: rash  Was seen for flank pain ~1 week ago, now with rash in that area for ~48 hours, she thinks it's shingles.  Current Outpatient Medications  Medication Instructions   lidocaine  (LIDODERM ) 5 % 1 patch, Transdermal, Every 24 hours, Remove & Discard patch within 12 hours or as directed by MD   venlafaxine  XR (EFFEXOR -XR) 150 mg, Oral, Daily with breakfast     Objective  Today's Vitals   01/04/24 1105 01/04/24 1144  BP: (!) 163/103 (!) 155/108  Pulse: 84 81  Weight: 221 lb 4.8 oz (100.4 kg)   Height: 5' 4 (1.626 m)   Body mass index is 37.99 kg/m.   Physical Exam Constitutional:      General: She is not in acute distress.    Appearance: Normal appearance.  Neck:     Thyroid : No thyroid  mass, thyromegaly or thyroid  tenderness.  Cardiovascular:     Rate and Rhythm: Normal rate and regular rhythm.     Pulses: Normal pulses.  Pulmonary:     Effort: Pulmonary effort is normal.  Skin:    General: Skin is warm and dry.     Findings: Rash (vesicular rash on left flank doesn't cross midline) present.  Neurological:     Mental Status: She is alert. Mental status is at baseline.     Cranial Nerves: No facial asymmetry.     Motor: No tremor.  Psychiatric:        Mood and Affect: Mood normal.        Behavior: Behavior normal.         Assessment & Plan  Problem List Items Addressed This Visit     Malignant neoplasm of upper-outer quadrant of left breast in female, estrogen receptor positive (HCC)   Status post bilateral mastectomy and hysterectomy. Completed neoadjuvant chemotherapy and adjuvant Xeloda .      Relevant Medications   valACYclovir  (VALTREX ) 1000 MG tablet   Herpes zoster without complication - Primary   Shingles outbreak on left flank. Rash started ~48 hours ago. No obvious reason for underlying immunocompromise but I deferred discussion about  HIV testing because her husband and daughter accompanied her to today's visit. She'll keep rash carefully covered and wash hands frequently, avoid contact with immunocompromised people until lesions are crusted. -start acetaminophen  1,000 mg q6 h -start advil  400 mg q6 h -start valacyclovir  1,000 mg tid x 10 days      Relevant Medications   valACYclovir  (VALTREX ) 1000 MG tablet   ibuprofen  (ADVIL ) 200 MG tablet   acetaminophen  (TYLENOL ) 500 MG tablet   Elevated blood pressure reading   In setting of acute shingles outbreak. Deferring workup and treatment for this at present.      Return in about 3 months (around 04/05/2024) for routine follow-up.  Ozell Kung MD 01/04/2024, 12:11 PM

## 2024-01-04 NOTE — Progress Notes (Signed)
 Internal Medicine Clinic Attending  Case discussed with the resident at the time of the visit.  We reviewed the resident's history and exam and pertinent patient test results.  I agree with the assessment, diagnosis, and plan of care documented in the resident's note.

## 2024-01-04 NOTE — Patient Instructions (Signed)
 Remember to bring all of the medications that you take (including over the counter medications and supplements) with you to every clinic visit.  This after visit summary is an important review of tests, referrals, and medication changes that were discussed during your visit. If you have questions or concerns, call [phone]. Outside of clinic business hours, call the main hospital at [phone] and ask the operator for the on-call internal medicine resident.   Ozell Kung MD 01/04/2024, 11:54 AM

## 2024-01-05 ENCOUNTER — Encounter: Payer: Self-pay | Admitting: Student

## 2024-01-05 DIAGNOSIS — B029 Zoster without complications: Secondary | ICD-10-CM

## 2024-01-05 MED ORDER — GABAPENTIN 300 MG PO CAPS: 300.0000 mg | ORAL_CAPSULE | Freq: Three times a day (TID) | ORAL | 2 refills | Status: DC | PRN

## 2024-01-05 MED ORDER — GABAPENTIN 300 MG PO CAPS: 300.0000 mg | ORAL_CAPSULE | Freq: Three times a day (TID) | ORAL | 0 refills | Status: AC | PRN

## 2024-01-05 NOTE — Addendum Note (Signed)
 Addended by: NORRINE SHARPER on: 01/05/2024 05:19 PM   Modules accepted: Orders

## 2024-01-05 NOTE — Telephone Encounter (Addendum)
 Severe pain from shingles rash, keeping her up at night.  Meds ordered this encounter  Medications   DISCONTD: gabapentin  (NEURONTIN ) 300 MG capsule    Sig: Take 1 capsule (300 mg total) by mouth 3 (three) times daily as needed (pain from shingles rash).    Dispense:  90 capsule    Refill:  2   gabapentin  (NEURONTIN ) 300 MG capsule    Sig: Take 1 capsule (300 mg total) by mouth 3 (three) times daily as needed for up to 14 days (pain from shingles rash).    Dispense:  42 capsule    Refill:  0   Ozell Kung MD 01/05/2024, 5:19 PM

## 2024-01-23 ENCOUNTER — Other Ambulatory Visit (HOSPITAL_COMMUNITY): Payer: Self-pay

## 2024-02-25 ENCOUNTER — Other Ambulatory Visit (HOSPITAL_COMMUNITY): Payer: Self-pay

## 2024-05-26 ENCOUNTER — Other Ambulatory Visit (HOSPITAL_COMMUNITY): Payer: Self-pay

## 2024-05-26 ENCOUNTER — Other Ambulatory Visit: Payer: Self-pay

## 2024-05-26 ENCOUNTER — Other Ambulatory Visit: Payer: Self-pay | Admitting: Nurse Practitioner

## 2024-05-26 MED ORDER — VENLAFAXINE HCL ER 150 MG PO CP24
150.0000 mg | ORAL_CAPSULE | Freq: Every day | ORAL | 5 refills | Status: AC
  Filled 2024-05-26: qty 30, 30d supply, fill #0
  Filled 2024-06-23: qty 30, 30d supply, fill #1

## 2024-06-23 ENCOUNTER — Other Ambulatory Visit (HOSPITAL_COMMUNITY): Payer: Self-pay

## 2024-08-29 ENCOUNTER — Other Ambulatory Visit

## 2024-08-29 ENCOUNTER — Ambulatory Visit: Admitting: Nurse Practitioner
